# Patient Record
Sex: Male | Born: 1965 | Race: Black or African American | Hispanic: No | Marital: Single | Smoking: Former smoker
Health system: Southern US, Community
[De-identification: ages and names within clinical notes are randomized; demographics above are authoritative.]

## PROBLEM LIST (undated history)

## (undated) DIAGNOSIS — Z91199 Patient's noncompliance with other medical treatment and regimen due to unspecified reason: Secondary | ICD-10-CM

## (undated) DIAGNOSIS — G4730 Sleep apnea, unspecified: Secondary | ICD-10-CM

## (undated) DIAGNOSIS — E669 Obesity, unspecified: Secondary | ICD-10-CM

## (undated) DIAGNOSIS — I509 Heart failure, unspecified: Secondary | ICD-10-CM

## (undated) DIAGNOSIS — I428 Other cardiomyopathies: Secondary | ICD-10-CM

## (undated) DIAGNOSIS — I10 Essential (primary) hypertension: Secondary | ICD-10-CM

## (undated) DIAGNOSIS — N189 Chronic kidney disease, unspecified: Secondary | ICD-10-CM

## (undated) DIAGNOSIS — E876 Hypokalemia: Secondary | ICD-10-CM

## (undated) DIAGNOSIS — I2510 Atherosclerotic heart disease of native coronary artery without angina pectoris: Secondary | ICD-10-CM

## (undated) DIAGNOSIS — Z87898 Personal history of other specified conditions: Secondary | ICD-10-CM

## (undated) DIAGNOSIS — Z720 Tobacco use: Secondary | ICD-10-CM

## (undated) DIAGNOSIS — Z9119 Patient's noncompliance with other medical treatment and regimen: Secondary | ICD-10-CM

## (undated) HISTORY — DX: Chronic kidney disease, unspecified: N18.9

## (undated) HISTORY — DX: Heart failure, unspecified: I50.9

## (undated) HISTORY — DX: Essential (primary) hypertension: I10

## (undated) HISTORY — PX: OTHER SURGICAL HISTORY: SHX169

## (undated) HISTORY — DX: Tobacco use: Z72.0

## (undated) HISTORY — DX: Patient's noncompliance with other medical treatment and regimen due to unspecified reason: Z91.199

## (undated) HISTORY — DX: Personal history of other specified conditions: Z87.898

## (undated) HISTORY — DX: Atherosclerotic heart disease of native coronary artery without angina pectoris: I25.10

## (undated) HISTORY — DX: Obesity, unspecified: E66.9

## (undated) HISTORY — DX: Patient's noncompliance with other medical treatment and regimen: Z91.19

---

## 1998-05-15 ENCOUNTER — Emergency Department (HOSPITAL_COMMUNITY): Admission: EM | Admit: 1998-05-15 | Discharge: 1998-05-15 | Payer: Self-pay | Admitting: Internal Medicine

## 1998-05-23 ENCOUNTER — Emergency Department (HOSPITAL_COMMUNITY): Admission: EM | Admit: 1998-05-23 | Discharge: 1998-05-23 | Payer: Self-pay

## 2004-09-08 ENCOUNTER — Other Ambulatory Visit: Payer: Self-pay

## 2004-09-08 ENCOUNTER — Emergency Department: Payer: Self-pay | Admitting: Emergency Medicine

## 2008-08-22 DIAGNOSIS — Z87898 Personal history of other specified conditions: Secondary | ICD-10-CM

## 2008-08-22 HISTORY — DX: Personal history of other specified conditions: Z87.898

## 2008-11-11 ENCOUNTER — Inpatient Hospital Stay (HOSPITAL_COMMUNITY): Admission: AD | Admit: 2008-11-11 | Discharge: 2008-11-13 | Payer: Self-pay | Admitting: Cardiology

## 2008-11-11 ENCOUNTER — Ambulatory Visit: Payer: Self-pay | Admitting: Cardiology

## 2008-11-13 ENCOUNTER — Encounter: Payer: Self-pay | Admitting: Cardiology

## 2008-11-27 ENCOUNTER — Ambulatory Visit: Payer: Self-pay | Admitting: Cardiology

## 2008-11-27 LAB — CONVERTED CEMR LAB
CO2: 32 meq/L (ref 19–32)
Chloride: 102 meq/L (ref 96–112)
Sodium: 139 meq/L (ref 135–145)

## 2008-12-10 ENCOUNTER — Ambulatory Visit: Payer: Self-pay | Admitting: Cardiology

## 2008-12-10 LAB — CONVERTED CEMR LAB
CO2: 32 meq/L (ref 19–32)
GFR calc Af Amer: 71 mL/min
Glucose, Bld: 86 mg/dL (ref 70–99)
Potassium: 3.6 meq/L (ref 3.5–5.1)
Sodium: 140 meq/L (ref 135–145)

## 2008-12-30 ENCOUNTER — Ambulatory Visit: Payer: Self-pay | Admitting: Cardiology

## 2008-12-30 LAB — CONVERTED CEMR LAB
BUN: 16 mg/dL (ref 6–23)
Chloride: 102 meq/L (ref 96–112)
GFR calc non Af Amer: 64 mL/min
Glucose, Bld: 82 mg/dL (ref 70–99)
Potassium: 3.7 meq/L (ref 3.5–5.1)

## 2009-02-14 ENCOUNTER — Ambulatory Visit: Payer: Self-pay | Admitting: Cardiology

## 2009-02-14 LAB — CONVERTED CEMR LAB
CO2: 29 meq/L (ref 19–32)
Chloride: 99 meq/L (ref 96–112)
Creatinine, Ser: 1.4 mg/dL (ref 0.4–1.5)
Potassium: 3.2 meq/L — ABNORMAL LOW (ref 3.5–5.1)

## 2009-02-18 DIAGNOSIS — I10 Essential (primary) hypertension: Secondary | ICD-10-CM | POA: Insufficient documentation

## 2009-02-18 DIAGNOSIS — F17200 Nicotine dependence, unspecified, uncomplicated: Secondary | ICD-10-CM

## 2009-02-18 DIAGNOSIS — I2510 Atherosclerotic heart disease of native coronary artery without angina pectoris: Secondary | ICD-10-CM

## 2009-02-18 DIAGNOSIS — Z8669 Personal history of other diseases of the nervous system and sense organs: Secondary | ICD-10-CM | POA: Insufficient documentation

## 2009-02-18 DIAGNOSIS — R079 Chest pain, unspecified: Secondary | ICD-10-CM

## 2009-02-18 DIAGNOSIS — E669 Obesity, unspecified: Secondary | ICD-10-CM

## 2009-02-18 HISTORY — DX: Atherosclerotic heart disease of native coronary artery without angina pectoris: I25.10

## 2009-03-06 ENCOUNTER — Telehealth: Payer: Self-pay | Admitting: Cardiology

## 2009-03-28 ENCOUNTER — Ambulatory Visit: Payer: Self-pay | Admitting: Cardiology

## 2009-03-31 LAB — CONVERTED CEMR LAB
BUN: 23 mg/dL (ref 6–23)
CO2: 30 meq/L (ref 19–32)
Chloride: 106 meq/L (ref 96–112)
GFR calc non Af Amer: 65.6 mL/min (ref >60)
Glucose, Bld: 69 mg/dL — ABNORMAL LOW (ref 70–99)
Potassium: 3.8 meq/L (ref 3.5–5.1)

## 2009-05-29 ENCOUNTER — Ambulatory Visit: Payer: Self-pay | Admitting: Cardiology

## 2009-09-19 ENCOUNTER — Encounter: Payer: Self-pay | Admitting: Cardiology

## 2009-09-22 ENCOUNTER — Telehealth: Payer: Self-pay | Admitting: Cardiology

## 2009-10-07 ENCOUNTER — Ambulatory Visit: Payer: Self-pay | Admitting: Cardiology

## 2009-10-07 ENCOUNTER — Ambulatory Visit: Payer: Self-pay

## 2009-10-13 LAB — CONVERTED CEMR LAB
BUN: 20 mg/dL (ref 6–23)
Calcium: 9.3 mg/dL (ref 8.4–10.5)
Creatinine, Ser: 1.5 mg/dL (ref 0.4–1.5)
GFR calc non Af Amer: 65.44 mL/min (ref >60)

## 2009-10-29 ENCOUNTER — Encounter: Payer: Self-pay | Admitting: Pulmonary Disease

## 2009-10-29 ENCOUNTER — Encounter: Payer: Self-pay | Admitting: Cardiology

## 2009-10-29 ENCOUNTER — Ambulatory Visit (HOSPITAL_BASED_OUTPATIENT_CLINIC_OR_DEPARTMENT_OTHER): Admission: RE | Admit: 2009-10-29 | Discharge: 2009-10-29 | Payer: Self-pay | Admitting: Cardiology

## 2009-11-05 ENCOUNTER — Ambulatory Visit: Payer: Self-pay | Admitting: Cardiology

## 2009-11-06 ENCOUNTER — Ambulatory Visit: Payer: Self-pay | Admitting: Pulmonary Disease

## 2009-11-24 ENCOUNTER — Ambulatory Visit: Payer: Self-pay | Admitting: Pulmonary Disease

## 2009-11-24 DIAGNOSIS — G4733 Obstructive sleep apnea (adult) (pediatric): Secondary | ICD-10-CM

## 2009-12-15 ENCOUNTER — Encounter: Payer: Self-pay | Admitting: Cardiology

## 2010-02-18 ENCOUNTER — Ambulatory Visit: Payer: Self-pay | Admitting: Cardiology

## 2010-02-18 ENCOUNTER — Telehealth (INDEPENDENT_AMBULATORY_CARE_PROVIDER_SITE_OTHER): Payer: Self-pay | Admitting: *Deleted

## 2010-03-05 ENCOUNTER — Encounter: Payer: Self-pay | Admitting: Cardiology

## 2010-06-24 ENCOUNTER — Telehealth (INDEPENDENT_AMBULATORY_CARE_PROVIDER_SITE_OTHER): Payer: Self-pay | Admitting: *Deleted

## 2010-10-21 ENCOUNTER — Encounter (INDEPENDENT_AMBULATORY_CARE_PROVIDER_SITE_OTHER): Payer: Self-pay | Admitting: *Deleted

## 2010-11-13 ENCOUNTER — Ambulatory Visit: Payer: Self-pay | Admitting: Cardiology

## 2010-12-13 ENCOUNTER — Encounter: Payer: Self-pay | Admitting: Emergency Medicine

## 2010-12-20 LAB — CONVERTED CEMR LAB
BUN: 21 mg/dL (ref 6–23)
CO2: 29 meq/L (ref 19–32)
Calcium: 9.1 mg/dL (ref 8.4–10.5)
Calcium: 9.4 mg/dL (ref 8.4–10.5)
Creatinine, Ser: 1.5 mg/dL (ref 0.4–1.5)
Creatinine, Ser: 1.5 mg/dL (ref 0.4–1.5)
GFR calc non Af Amer: 65.33 mL/min (ref >60)
Glucose, Bld: 97 mg/dL (ref 70–99)
Sodium: 140 meq/L (ref 135–145)

## 2010-12-22 NOTE — Letter (Signed)
Summary: Appointment - Missed  [facility], Main Office  [address]   [place], [place] [address]   Phone: [phone]  Fax: [phone]     October 21, 2010 MRN: [id number]   [facility] [address] [place], [place]  [address]   Dear Mr. Jarchow,  Our records indicate you missed your appointment in September with Dr. Riley Kill. It is very important that we reach you to reschedule this appointment. We look forward to participating in your health care needs. Please contact us at the number listed above at your earliest convenience to reschedule this appointment.     Sincerely, Neurosurgeon Team LG

## 2010-12-22 NOTE — Assessment & Plan Note (Signed)
Summary: consult for severe osa   Copy to:  Shawnie Pons Primary Provider/Referring Provider:  Ninfa Linden  CC:  Sleep Consult.  History of Present Illness: The pt is a 45y/o male who I have been asked to see for management of osa.  He has had a recent split night study where he was found to have an AHI of 91/hr with desat to 69%.  He was fitted with a medium quattro full face mask, and titrated to an optimal pressure of 10cm.  The pt has been noted to have loud snoring, but no one has mentioned pauses in his breathing during sleep.  He does have occasional choking arousals.  He goes to bed btw 10-74mn, and arises at 7-8am to start his day.  He has frequent awakenings during the night, and is not rested upon arising.  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  He denies any issues with driving.  Of note, his weight is up 45 pounds in the last 2 years, and his epworth score is only 2.  Current Medications (verified): 1)  Aspirin 81 Mg Tbec (Aspirin) .... Take Two  Tablets By Mouth Daily 2)  Pravastatin Sodium 40 Mg Tabs (Pravastatin Sodium) .... Take One Tablet By Mouth Daily At Bedtime 3)  Benazepril Hcl 40 Mg Tabs (Benazepril Hcl) .... Take 1 By Mouth Once Daily 4)  Triamterene-Hctz 37.5-25 Mg Tabs (Triamterene-Hctz) .... Take 1 Tablet By Mouth Once A Day 5)  Amlodipine Besylate 10 Mg Tabs (Amlodipine Besylate) .... Take One Tablet By Mouth Daily 6)  Nitroglycerin 0.4 Mg Subl (Nitroglycerin) .... One Tablet Under Tongue Every 5 Minutes As Needed For Chest Pain---May Repeat Times Three 7)  Carvedilol 25 Mg Tabs (Carvedilol) .... Take 1 1/2 Tabs By Mouth  Two Times A Day 8)  Viagra 50 Mg Tabs (Sildenafil Citrate) .... Take As Directed--Do Not Use With Nitroglycerin 9)  Potassium Chloride Cr 10 Meq Cr-Caps (Potassium Chloride) .... Take One Tablet By Mouth Daily  Allergies (verified): 1)  !  Penicillin  Past History:  Past Medical History: Reviewed history from 02/18/2009 and no changes required. 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 Unremarkable  Family History:  FAMILY HISTORY:  His mother died in her 28s with lupus, but no known   heart disease in his father, died in his 53s with no coronary artery   disease, known.  No siblings have coronary artery disease. 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   He lives with uncle.  He is separated from his wife.   He is  currently working as a cna. pt started smoking at age 87.  1 pack will last 3 days. but denies alcohol or  drug abuse.      Review of Systems       The patient complains of shortness of breath at rest.  The patient denies shortness of breath with activity, productive cough, non-productive cough, coughing up blood, chest pain, irregular heartbeats, acid heartburn, indigestion, loss of appetite, weight change, abdominal pain, difficulty swallowing, sore throat, tooth/dental problems, headaches, nasal congestion/difficulty breathing through nose, sneezing, itching,  ear ache, anxiety, depression, hand/feet swelling, joint stiffness or pain, rash, change in color of mucus, and fever.    Vital Signs:  Patient profile:   45 year old male Height:      67 inches Weight:      210 pounds BMI:     33.01 O2 Sat:      94 % on Room air Temp:     98.0 degrees F oral Pulse rate:   81 / minute BP sitting:   120 / 72  (left arm) Cuff size:   regular  Vitals Entered By: Arman Filter LPN (November 24, 2009 11:03 AM)  O2 Flow:  Room air CC: Sleep  Consult Comments Medications reviewed with patient Arman Filter LPN  November 24, 2009 11:16 AM    Physical Exam  General:  ow male in nad Eyes:  PERRLA and EOMI.   Nose:  deviated septum to left with near obstruction, significant turbinate hypertrophy bilat Mouth:  significant elongation of soft palate and uvula with soft tissue narrowing posteriorly. Neck:  no jvd, tmg, LN Lungs:  clear to auscultation Heart:  rrr, no mrg Abdomen:  soft and nontender, bs+ Extremities:  minimal edema, no cyanosis pulses intact distally Neurologic:  alert and oriented, moves all 4.   Impression & Recommendations:  Problem # 1:  OBSTRUCTIVE SLEEP APNEA (ICD-327.23) The pt has severe osa by his recent sleep study, and underlying CV disease.  Although he feels that he is not very symptomatic during the day, he does admit that his sleep is very fragmented and is nonrestorative.  I have had a long discussion with the pt about osa, including its impact on his QOL and CV health.  I suspect he is more symptomatic than he realizes, and will see a major difference in his sense of well being once treated.  The optimal treatment for this severity of sleep apnea is cpap coupled with weight loss.  The pt is agreeable to trying this.  Medications Added to Medication List This Visit: 1)  Amlodipine Besylate 10 Mg Tabs (Amlodipine besylate) .... Take one tablet by mouth daily 2)  Carvedilol 25 Mg Tabs (Carvedilol) .... Take 1 1/2 tabs by mouth  two times a day  Other Orders: Consultation Level IV (27253) DME Referral (DME)  Patient Instructions: 1)  will set up on cpap, but call if issues. 2)  work on weight loss 3)  followup with me in 4 weeks.  Appended Document: consult for severe osa document reviewed

## 2010-12-22 NOTE — Progress Notes (Signed)
Summary: refill meds  Phone Note Refill Request Call back at Home Phone [phone] Message from:  Patient on June 24, 2010 2:35 PM  Refills Requested: Medication #1:  POTASSIUM CHLORIDE CR 10 MEQ CR-CAPS Take one tablet by mouth daily.  Medication #2:  TRIAMTERENE-HCTZ 37.5-25 MG TABS Take 1 tablet by mouth once a day [facility]    Method Requested: Fax to Local Pharmacy Initial call taken by: Lorne Skeens,  June 24, 2010 2:35 PM  Follow-up for Phone Call        Rx called to pharmacy. Vikki Ports  June 24, 2010 3:31 PM      Prescriptions: POTASSIUM CHLORIDE CR 10 MEQ CR-CAPS (POTASSIUM CHLORIDE) Take one tablet by mouth daily  #30 x 6   Entered by:   Vikki Ports   Authorized by:   Ronaldo Miyamoto, MD, [facility]   Signed by:   Vikki Ports on 06/24/2010   Method used:   Faxed to ...       [facility] (retail)       [address]       [place], [place]  [address]       Ph: [phone]       Fax: [phone]   RxID:   [id number] TRIAMTERENE-HCTZ 37.5-25 MG TABS (TRIAMTERENE-HCTZ) Take 1 tablet by mouth once a day  #30 x 6   Entered by:   Vikki Ports   Authorized by:   Ronaldo Miyamoto, MD, [facility]   Signed by:   Vikki Ports on 06/24/2010   Method used:   Faxed to ...       [facility] (retail)       [address]       [place], [place]  [address]       Ph: [phone]       Fax: [phone]   RxID:   [id number]

## 2010-12-22 NOTE — Assessment & Plan Note (Signed)
Summary: 2 month rov/sl   CC:  No cardiac complains.  Current Medications (verified): 1)  Aspirin 81 Mg Tbec (Aspirin) .... Take Two  Tablets By Mouth Daily 2)  Pravastatin Sodium 40 Mg Tabs (Pravastatin Sodium) .... Take One Tablet By Mouth Daily At Bedtime 3)  Benazepril Hcl 40 Mg Tabs (Benazepril Hcl) .... Take 1 By Mouth Once Daily 4)  Triamterene-Hctz 37.5-25 Mg Tabs (Triamterene-Hctz) .... Take 1 Tablet By Mouth Once A Day 5)  Amlodipine Besylate 10 Mg Tabs (Amlodipine Besylate) .... Take One Tablet By Mouth Daily 6)  Nitroglycerin 0.4 Mg Subl (Nitroglycerin) .... One Tablet Under Tongue Every 5 Minutes As Needed For Chest Pain---May Repeat Times Three 7)  Carvedilol 25 Mg Tabs (Carvedilol) .... Take 1 1/2 Tabs By Mouth  Two Times A Day 8)  Viagra 50 Mg Tabs (Sildenafil Citrate) .... Take As Directed--Do Not Use With Nitroglycerin 9)  Potassium Chloride Cr 10 Meq Cr-Caps (Potassium Chloride) .... Take One Tablet By Mouth Daily  Allergies: 1)  ! Penicillin  Vital Signs:  Patient profile:   45 year old male Height:      67 inches Weight:      207.50 pounds BMI:     32.62 Pulse rate:   68 / minute Pulse rhythm:   regular Resp:     18 per minute BP sitting:   140 / 90  (left arm) Cuff size:   large  Vitals Entered By: Vikki Ports (February 18, 2010 10:40 AM)   EKG  Procedure date:  02/18/2010  Findings:      LVH.  No repole change  Impression & Recommendations:  Problem # 1:  CORONARY ARTERY DISEASE (ICD-414.00)  stable His updated medication list for this problem includes:    Aspirin 81 Mg Tbec (Aspirin) .Marland Kitchen... Take two  tablets by mouth daily    Benazepril Hcl 40 Mg Tabs (Benazepril hcl) .Marland Kitchen... Take 1 by mouth once daily    Amlodipine Besylate 10 Mg Tabs (Amlodipine besylate) .Marland Kitchen... Take one tablet by mouth daily    Nitroglycerin 0.4 Mg Subl (Nitroglycerin) ..... One tablet under tongue every 5 minutes as needed for chest pain---may repeat times three    Carvedilol  25 Mg Tabs (Carvedilol) .Marland Kitchen... Take 1 1/2 tabs by mouth  two times a day  Orders: EKG w/ Interpretation (93000) TLB-BMP (Basic Metabolic Panel-BMET) (80048-METABOL) TLB-Magnesium (Mg) (83735-MG)  Problem # 2:  OBSTRUCTIVE SLEEP APNEA (ICD-327.23) Severe on two separate studies.  Not sure where last study came from.  We did not order this study.  Nonetheless, he cannot afford.  I spoke with him at lenght about need for weight loss, consequences of severe sleep apnea, and find findings.  reveiwed in detail.  Problem # 3:  HYPERTENSION, BENIGN (ICD-401.1)  Likely from weight, sleep apnea, etc.  Has LVH.  Reviewed consequences with patient.   Check BMET. His updated medication list for this problem includes:    Aspirin 81 Mg Tbec (Aspirin) .Marland Kitchen... Take two  tablets by mouth daily    Benazepril Hcl 40 Mg Tabs (Benazepril hcl) .Marland Kitchen... Take 1 by mouth once daily    Triamterene-hctz 37.5-25 Mg Tabs (Triamterene-hctz) .Marland Kitchen... Take 1 tablet by mouth once a day    Amlodipine Besylate 10 Mg Tabs (Amlodipine besylate) .Marland Kitchen... Take one tablet by mouth daily    Carvedilol 25 Mg Tabs (Carvedilol) .Marland Kitchen... Take 1 1/2 tabs by mouth  two times a day  Orders: EKG w/ Interpretation (93000) TLB-BMP (Basic Metabolic Panel-BMET) (80048-METABOL)  TLB-Magnesium (Mg) (83735-MG)  Problem # 4:  TOBACCO ABUSE (ICD-305.1) Reviewed techniques for discontinuation with him.    Patient Instructions: 1)  Your physician recommends that you have lab work today: BMP and Magnesium 2)  Your physician recommends that you continue on your current medications as directed. Please refer to the Current Medication list given to you today. 3)  Your physician wants you to follow-up in:   4 MONTHS. You will receive a reminder letter in the mail two months in advance. If you don't receive a letter, please call our office to schedule the follow-up appointment. 4)  Your physician recommends that you schedule a follow-up appointment: with Dr Shelle Iron

## 2010-12-22 NOTE — Progress Notes (Signed)
Summary: speak to nurs- pt being seen NOW  Phone Note From Other Clinic   Caller: lauren w/ dr Riley Kill Call For: clance Summary of Call: requst to speak to nurse re: pt. pt there now. call lauren @ x [phone] Initial call taken by: Tivis Ringer, CNA,  February 18, 2010 11:21 AM  Follow-up for Phone Call        called and spoke with Lauren Dr. Rosalyn Charters nurse.  Lauren wanted to know if Dr. Shelle Iron had ordered this home sleep study that was scanned into the pt's chart on 12/15/2009.  I informed Lauren that Dr. Shelle Iron has only seen the pt one time for a sleep consult on 11-24-2009 and the only thing he ordered for pt was to start him on cpap.  Also, I asked Lauren if she would remind the pt to make a f/u appt with [facility] because he is overdue for his 4 week f/u appt.  Lauren stated she will inform pt.  Aundra Millet Reynolds LPN  February 18, 2010 11:28 AM

## 2011-02-01 ENCOUNTER — Ambulatory Visit: Payer: Self-pay | Admitting: Cardiology

## 2011-04-06 NOTE — Cardiovascular Report (Signed)
NAMEFARRON, [name]                ACCOUNT NO.:  [id number]   MEDICAL RECORD NO.:  [id number]          PATIENT TYPE:  INP   LOCATION:  2901                         FACILITY:  [facility]   PHYSICIAN:  Arturo Morton. Riley Kill, MD, FACCDATE OF BIRTH:  04-16-1966   DATE OF PROCEDURE:  11/11/2008  DATE OF DISCHARGE:                            CARDIAC CATHETERIZATION   INDICATIONS:  [name] is a 45 year old gentleman from [place] who  presented to the [facility] Emergency Room with a history of chest pain.  EKG showed about a millimeter of ST elevation in V1-V3.  This was  accompanied by some mild inferior ST depression.  Given the patient's  history, Dr. Colon Branch initiated a code STEMI for urgent cardiac  catheterization.  He was brought to the [facility] Lab for urgent  intervention.   PROCEDURES:  1. Left heart catheterization.  2. Selective coronary arteriography.  3. Selective left ventriculography.   DESCRIPTION OF THE PROCEDURE:  The patient was brought to the  catheterization laboratory and prepped and draped in the usual fashion.  Through an anterior puncture, the femoral artery was entered after we  examined the patient.  A 6-French sheath was then placed.  ACT was  checked.  Views of the left coronary artery and right coronary artery  were made.  The left coronary artery had an upward takeoff, we had re-  engaged with a 35 catheter.  There was also some catheter-induced spasm  into the ostium of the right coronary.  As a result, following  ventriculography, we went back with a 3-curved guiding catheter.  Intra-  aortic nitroglycerin was given to maximally dilate the vessels.  Additional more high-volume views of the left coronary system were  obtained by necessity.  This was followed by use of a 3DRC catheter to  engage the right coronary artery.  Additional nitroglycerin was given  here as well.  Following this, views of the right coronary artery were  obtained.  We carefully  reviewed all of the films, and based on the  findings, we elected to recommend medical treatment with additional  enzymes.   HEMODYNAMIC DATA:  1. The initial central aortic pressure was 145/95, mean 117.  2. Left ventricular pressure was 143/30.  3. There was no gradient on pullback across the aortic valve.   ANGIOGRAPHIC DATA:  1. The left main tapers distally with about 20% narrowing.  2. The proximal LAD clearly has some mild concentric plaquing.  This      would measure about 40-50% in luminal reduction.  The vessel opens      up and then is calcified in its midportion.  Beyond the area of      calcification is a second area of narrowing.  The second area of      narrowing measures about 40-50% in luminal reduction.  This was      shown to the patient in multiple views.  The distal LAD wraps the      apex and was without critical narrowing.  3. The circumflex provides a proximal marginal and a second marginal  branch.  There is a third marginal branch that is moderate in size.      The AV circumflex after this has probably 30 and 40% narrowing in a      segmental area of plaquing distally.  4. The right coronary artery is somewhat smaller caliber vessel.  Just      after the takeoff of the SA nodal artery, there is about 50% area      of focal narrowing.  There is 40% narrowing in the midvessel and 20-      30% narrowing more distal to this.  5. Ventriculography in the RAO projection reveals vigorous global      systolic function.  No definite wall motion abnormalities are      identified.   CONCLUSIONS:  1. Preserved LV function.  2. Scattered coronary artery disease.   RECOMMENDATIONS:  1. We will do serial enzymes.  2. We will highly recommend that the patient attempt to stop smoking      with well-recognized programs, including setting a date and 2-week      followup.  3. We may add some nitrates to his overall regimen.  At the present      time, he will be on a  medical regimen.  4. We will add aspirin.  5. He will require an exercise tolerance test when stabilized.      Arturo Morton. Riley Kill, MD, [facility]  Electronically Signed     TDS/MEDQ  D:  11/11/2008  T:  11/12/2008  Job:  [id number]   cc:   Nicoletta Dress. Colon Branch, M.D.  Cardiovascular Laboratory

## 2011-04-06 NOTE — H&P (Signed)
[name], [name]                ACCOUNT NO.:  [id number]   MEDICAL RECORD NO.:  [id number]          PATIENT TYPE:  INP   LOCATION:  2901                         FACILITY:  [facility]   PHYSICIAN:  Arturo Morton. Riley Kill, MD, FACCDATE OF BIRTH:  July 02, 1966   DATE OF ADMISSION:  11/11/2008  DATE OF DISCHARGE:                              HISTORY & PHYSICAL   PRIMARY CARE PHYSICIAN:  [facility].   PRIMARY CARDIOLOGIST:  None.   CHIEF COMPLAINT:  Chest pain.   HISTORY OF PRESENT ILLNESS:  Mr. Montilla is a 45 year old male with no  previous history of coronary artery disease.  He has been having  episodes of chest pain at times, but they have all resolves  spontaneously.  Today, he was awakened by chest pain at approximately 2  a.m.  It increased to 8/10.  He went to [facility] and  was sent from there by car to [facility] Emergency room.  In [facility]  Emergency Room, his EKG was concerning for an ST elevation MI.  He was  transferred by EMS to [facility] and taken directly to the Cath Lab.  He was  restarted on IV nitroglycerin.  He was given morphine IV.  Also per EMS,  he was given 325 mg of aspirin.  Currently, his chest pain is a 2/10.  It ha not resolved at any time since it woke him this morning.  It has  been associated with shortness of breath, but no nausea, vomiting, or  diaphoresis.   PAST MEDICAL HISTORY:  1. Hypertension.  2. Ongoing tobacco use.  3. Borderline obesity with a body mass index of 30.7.  4. History of syncope in October 2009 felt secondary to hypertension.  5. Status post echocardiogram in December 2008 by the [facility] [place], showing grade 1 diastolic dysfunction with mild      LDH and an EF greater than 55%, no significant valvular disease.   ALLERGIES:  PENICILLIN.   CURRENT MEDICATIONS:  1. Maxzide 25/37.5 daily.  2. Lotrel 5/20 mg 2 capsules daily.  3. Aldomet 250 mg b.i.d.   SOCIAL HISTORY:  He lives in  [place], but has a Psychiatrist.  He lives with family.  He is separated from his wife.  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  He has an  approximately 20-pack-year history of tobacco use, but denies alcohol or  drug abuse.   FAMILY HISTORY:  His mother died in her 54s with lupus, but no known  heart disease in his father, died in his 52s with no coronary artery  disease, known.  No siblings have coronary artery disease.   REVIEW OF SYSTEMS:  He has not been having fevers, chills, or sweats.  This is his first episode of chest pain in the last few days.  He has  not had any other episodes of syncope.  Review of systems was  abbreviated, but is otherwise negative.   PHYSICAL EXAMINATION:  VITAL SIGNS: Blood pressure 144/79, heart rate  70,  respiratory rate 18, temperature 97.3, and O2 saturation 100% on O2.  GENERAL:  He is a well-developed [place] American male in mild distress.  HEENT:  Normal.  NECK:  There is no lymphadenopathy, thyromegaly, bruit, or JVD noted.  CV:  Heart is regular in rate and rhythm with an S1, S2, and no  significant murmur, rub, or gallop is noted.  LUNGS:  Essentially clear to auscultation bilaterally.  SKIN:  No rashes or lesions are noted.  ABDOMEN:  Soft and nontender with active bowel sounds.  EXTREMITIES:  There is no cyanosis, clubbing, or edema noted.  Distal  pulses are intact in all four extremities and no femoral bruits are  appreciated.  MUSCULOSKELETAL:  There is no joint deformity or effusions.  NEUROLOGIC:  He is alert and oriented.  Cranial nerves II through XII  grossly intact.   EKG did at 3:02 p.m. shows heart rate 71 beats per minute, sinus rhythm  with J-point elevation in the V1 and V2, and T-wave inversions in  inferior leads.   LABORATORY VALUES:  Hemoglobin 15.5, hematocrit 45.8, WBC 6.3, platelets  336.  Sodium 132, potassium 4.0, chloride 97, CO2 32, BUN 18, creatinine  1.29, glucose 85.   Point care markers negative x1.   IMPRESSION:  Mr. Salemi was seen today by Dr. Riley Kill.  His EKG is  concerning for an ST elevation myocardial infarction and he is having  ongoing pain.  The duration of the pain is greater than 12 hours.  He is  being taken emergently to the Cath Lab with further evaluation and  treatment depending on the results of the procedure.      Theodore Demark, PA-C      Arturo Morton. Riley Kill, MD, [facility]  Electronically Signed    RB/MEDQ  D:  11/11/2008  T:  11/12/2008  Job:  [id number]   cc:   Olegario Messier L. Jarold Motto, FNP-C

## 2011-04-06 NOTE — Assessment & Plan Note (Signed)
[facility]                            CARDIOLOGY OFFICE NOTE   [name], [name]                       MRN:          [id number]  DATE:12/10/2008                            DOB:          12-Aug-1966    PRIMARY CARDIOLOGIST:  Arturo Morton. Riley Kill, MD, [facility].   PRIMARY CARE Anette Barra:  Ninfa Linden, NP   PATIENT PROFILE:  [name] with prior history  of hypertension and nonobstructive CAD who presents for clinic followup.  1. History of chest pain/nonobstructive coronary artery disease.      a.     December 2009, left heart cardiac catheterization, left main       20%.  LAD 40-50% proximal, 6% mid.  Left circumflex with mild       scattered plaquing.  RCA with diffuse nonobstructive disease.  EF       was preserved.      b.     A 2-D echocardiogram performed on November 13, 2008, showing       an EF of 60% with a questionable inferoseptal hypokinesis at the       base.  2. Hypertension.  3. Ongoing tobacco abuse, currently smoking 1-2 cigarettes a day.  4. Borderline obesity.  5. History of syncope in October 2009, felt secondary to hypertension.   HISTORY OF PRESENT ILLNESS:  [name] with  the above problems.  He was last seen in clinic by Dr. Riley Kill on  November 27, 2008.  During that visit, his blood pressure was elevated and  adjustments were made to his medications.  He did have a BMET that day,  which showed normal renal function as well as a normal electrolytes.  He  was advised to have followup BMET 1 week later.  He has not had a repeat  BMET.  He did as Dr. Riley Kill advised and took a half of a triamterene  HCTZ and then subsequently titrated to a full 37.5/25 mg tablets.  He  noticed once doing this, but his blood pressure was much improved in the  140s-150s compared to the usual 190s-200s.  He has been checking his  pressure at home regularly.  He has been walking twice a day for 30  minutes without chest pain or dyspnea.  He thinks that his exercise  tolerance has much improved since blood pressure is improved.  His blood  pressure today is 125/79.   MEDICATIONS:  1. Aspirin 81 mg 2 tablets daily.  2. Carvedilol 12.5 mg b.i.d.  3. Pravastatin 40 mg daily.  4. Benazepril 40 mg daily.  5. Triamterene HCTZ 37.5/25 mg daily.  6. Amlodipine 10 mg daily.   PHYSICAL EXAMINATION:  VITAL SIGNS:  Blood pressure 125/79, pulse 78,  respirations 60.  He is afebrile.  Weight is 202 pounds.  GENERAL:  Pleasant African American male in no acute distress.  Awake,  alert x3.  PSYCH:  Normal affect.  HEENT:  Normal.  NEURO:  Grossly intact.  Nonfocal.  SKIN:  Warm and dry without lesions or masses.  MUSCULOSKELETAL:  Grossly  normal without deformity or fusions.  NECK:  No bruits, JVD.  LUNGS: Respirations are unlabored.  Clear to auscultation.  CARDIAC:  Regular S1 and S2.  No S3, S4, or murmurs.  ABDOMEN:  Round, nontender, and nondistended.  Bowel sounds present.  LOWER EXTREMITIES:  Warm and dry.  No clubbing, cyanosis, or edema.  Dorsalis pedis and posterior tibial pulses 2+ bilaterally.   ACCESSORY CLINICAL FINDINGS:  Repeat BMET is pending.   ASSESSMENT AND PLAN:  1. History of chest pain with nonobstructive coronary artery disease.      The patient has not had any recurrent chest pain.  He has been      walking 30 minutes 2 times a day without significant symptoms or      limitations.  He remains on aspirin, beta-blocker, ACE inhibitor,      statin, and calcium channel blocker therapy.  2. Hypertension.  Blood pressure is markedly improved today.  Since      pressures still running in the 140s-150s at home.  However, it was      previously running in the 190s-200s.  We will make no changes to      his medications today and consolidate his Coreg and benazepril,      which currently he is taking 2 tablets of lower doses to equal the      daily dose.  I have therefore  given a prescription for carvedilol      12.5 mg b.i.d. and benazepril 40 mg daily.  I will continue on      triamterene HCTZ, and because of this, we will obtain a repeat BMET      to determine whether or not he will require potassium      supplementation as well.  He has required potassium in the past.  3. Ongoing tobacco abuse.  The patient has cut back to 1-2 cigarettes      a day.  Smoking cessation has been strongly advised.  He is      currently considering using a patch.  I recommended that if he does      use the patch, he should use the 7 mg dose and try and taper that      to nothing over a 6-week period.  4. Hyperlipidemia.  The patient is on pravastatin followed by primary      care.   DISPOSITION:  The patient is advised to follow up his primary care  [name] in the next 2-4 weeks.  Follow up with Dr. Riley Kill in 6 months  or sooner if necessary.      Nicolasa Ducking, ANP  Electronically Signed      Rollene Rotunda, MD, [facility]  Electronically Signed   CB/MedQ  DD: 12/10/2008  DT: 12/11/2008  Job #: [id number]

## 2011-04-06 NOTE — Letter (Signed)
November 27, 2008    Ninfa Linden, [facility]  [facility] [address]  [place], [place] [address]   RE:  SELSO, MANNOR  MRN:  [id number]  /  DOB:  [birth year]   Dear Ms. Jarold Motto:   We saw Mr. Kern back in followup today.  He was transferred urgently to  [facility].  While here, he had mild ST elevation and he was  thought to be having an infarct, he did have evidence of coronary  disease, but it was not critical.  He had no definite etiology defined.  Since discharge from the hospital, his blood pressures remained  elevated.  Importantly, the patient did have some adjustments in his  medicines, with Aldomet being discontinued also because of concern of  combination of ACE inhibitors, and potassium-sparing agents, his  triamterene, HCTZ was stopped.  He did tell me that you have started him  on some potassium after starting him on some hydrochlorothiazide  previously.  His blood pressures had been somewhat elevated with  diastolics running in the low 100s.  He denies any recurrent chest pain.   MEDICATIONS:  1. Aspirin 162 mg daily.  2. Carvedilol 6.25 mg 2 tablets p.o. b.i.d.  3. Pravastatin 40 mg daily.  4. Benazepril 20 mg 2 tablets daily.  5. Amlodipine 10 mg one-half tablet daily.   PHYSICAL EXAMINATION:  The blood pressure is 160/100, the pulse is 62.  The lung fields are clear and the cardiac rhythm is regular.  There is  an S4 gallop.   The electrocardiogram reveals voltage criteria for left ventricular  hypertrophy and repolarization abnormality.   To summarize, he remains hypertensive.  We are going to start him back  on half of the triamterene, HCTZ after taking 1 tablet for 2 days, and I  will have him increase his amlodipine to a full tablet a day.  I have  asked him to see you in followup in 1 week to get a basic metabolic  profile, we will see him back in our Cardiology Clinic in 2 weeks.  We  appreciate the opportunity  of sharing in his care.    Sincerely,      Arturo Morton. Riley Kill, MD, [facility]  Electronically Signed    TDS/MedQ  DD: 11/27/2008  DT: 11/27/2008  Job #: [id number]

## 2011-04-06 NOTE — Discharge Summary (Signed)
NAMEKELIJAH, [name]                ACCOUNT NO.:  [id number]   MEDICAL RECORD NO.:  [id number]          PATIENT TYPE:  INP   LOCATION:  2004                         FACILITY:  [facility]   PHYSICIAN:  Arturo Morton. Riley Kill, MD, FACCDATE OF BIRTH:  05-05-1966   DATE OF ADMISSION:  11/11/2008  DATE OF DISCHARGE:  11/13/2008                               DISCHARGE SUMMARY   CHIEF COMPLAINT:  Chest pain.   HISTORY:  [name] was seen by Dr. Nicoletta Dress. Colon Branch, MD at the emergency  room after complaining of chest pain.  He had a borderline EKG.  As a  result, he was transferred urgently.  He had very mild ST elevation in  V1 and V2 with some inferior ST depression, and as a result was thought  to be having an infarct.  He was brought urgently to the cardiac  catheterization laboratory where he underwent procedure by me.  This  demonstrated scattered disease.  Specifically, there was 50, 40, 20, and  30% lesions in the right coronary.  The left coronary had about a 20%  left main and a 40-50% proximal LAD, and a 60% mid LAD.  There was mild  scattered plaquing in the AV circumflex and his overall LV function was  preserved.  The patient subsequently stabilized.  The following morning,  he was stable without any evidence of recurrent discomfort.  His cardiac  enzymes were negative x3.  Repeat electrocardiogram basically  demonstrated left ventricular hypertrophy with less T-wave abnormality  on the followup EKG.  His D-dimer was negative as well x2.  There was no  definite etiology identified.  At the time of discharge, he was stable  and ambulatory.  His lungs were clear to auscultation and percussion.  He was not febrile and there was no definite cough.  He saw the tobacco  cessation nurse in the hospital.  A 2-D echo was obtained and the  official report was pending at the time of discharge.  His white count  was normal.  He was also sent for chest x-ray, which was pending.  We  counseled him on  discontinuation of smoking, and the need to limit salt  in his diet.   Follow up will be with Dr. Shawnie Pons on Wednesday, November 28, 2007,  at 9 a.m.  He was encouraged not to drive and to take it easy for a  while until he is seen back in followup.  We also reviewed his medicines  in detail.   DISCHARGE MEDICATIONS:  1. Lotrel 05/20 one tablet daily.  2. Aspirin 162 mg daily.  3. Carvedilol 6.25 mg 1 twice daily.  4. Pravastatin 40 mg one daily.  5. Nitroglycerin 0.4 mg only as needed p.r.n., he was instructed in      its use.   FINAL DIAGNOSES:  1. Chest pain of uncertain etiology.  2. Systemic hypertension.  3. Moderate coronary artery disease by diagnostic catheterization      without acute findings.  4. Tobacco abuse.  5. History of prior echocardiogram demonstrating grade 1 diastolic  dysfunction.      Arturo Morton. Riley Kill, MD, [facility]  Electronically Signed     TDS/MEDQ  D:  11/13/2008  T:  11/13/2008  Job:  [id number]   cc:   Ninfa Linden, MSM, FNP-C  [facility]

## 2011-04-09 NOTE — Letter (Signed)
March 06, 2009    [facility]  [address]  [place], [place] [address]   RE:  WILLAIM, MODE  MRN:  [id number]  /  DOB:  Nov 11, 1966   To Whom It May Concern:   Mr. Battie is a patient who has been under my care.  He was hospitalized  in December, he continues to remain under my care.  Please call me if  you have any further questions.     Sincerely,      Arturo Morton. Riley Kill, MD, [facility]  Electronically Signed    TDS/MedQ  DD: 03/06/2009  DT: 03/07/2009  Job #: [id number]

## 2011-05-12 ENCOUNTER — Ambulatory Visit (INDEPENDENT_AMBULATORY_CARE_PROVIDER_SITE_OTHER): Payer: Self-pay | Admitting: Cardiology

## 2011-05-12 ENCOUNTER — Encounter: Payer: Self-pay | Admitting: Cardiology

## 2011-05-12 DIAGNOSIS — G4733 Obstructive sleep apnea (adult) (pediatric): Secondary | ICD-10-CM

## 2011-05-12 DIAGNOSIS — F17200 Nicotine dependence, unspecified, uncomplicated: Secondary | ICD-10-CM

## 2011-05-12 DIAGNOSIS — E7800 Pure hypercholesterolemia, unspecified: Secondary | ICD-10-CM

## 2011-05-12 DIAGNOSIS — I10 Essential (primary) hypertension: Secondary | ICD-10-CM

## 2011-05-12 DIAGNOSIS — Z136 Encounter for screening for cardiovascular disorders: Secondary | ICD-10-CM

## 2011-05-12 LAB — BASIC METABOLIC PANEL
CO2: 25 mEq/L (ref 19–32)
Calcium: 9.9 mg/dL (ref 8.4–10.5)
Chloride: 99 mEq/L (ref 96–112)
Glucose, Bld: 99 mg/dL (ref 70–99)
Potassium: 4.1 mEq/L (ref 3.5–5.1)
Sodium: 138 mEq/L (ref 135–145)

## 2011-05-12 MED ORDER — BENAZEPRIL HCL 40 MG PO TABS: 40.0000 mg | ORAL_TABLET | Freq: Every day | ORAL | Status: DC

## 2011-05-12 MED ORDER — CARVEDILOL 25 MG PO TABS: ORAL_TABLET | ORAL | Status: DC

## 2011-05-12 NOTE — Patient Instructions (Signed)
Your physician recommends that you schedule a follow-up appointment in: 3 weeks with Lilian Coma And 6 months with Dr. Riley Kill.

## 2011-05-20 ENCOUNTER — Other Ambulatory Visit: Payer: Self-pay | Admitting: *Deleted

## 2011-05-20 DIAGNOSIS — Z79899 Other long term (current) drug therapy: Secondary | ICD-10-CM

## 2011-05-26 ENCOUNTER — Encounter: Payer: Self-pay | Admitting: Cardiology

## 2011-05-26 NOTE — Assessment & Plan Note (Signed)
BP is currently not well controlled.  He did not take his carvedilol earlier today.  BP is poorly controlled.  Will need to get him back promptly for HTN follow up.  Need for compliance with therapies stressed.  Will have him follow up with Tereso Newcomer in three weeks to assess how he is doing back on meds.  Needs to get home cuff also to monitor therapy.  This is not truly a cardiac issue at this point in time, but overall a general medicine issue that needs close follow up.

## 2011-05-26 NOTE — Progress Notes (Signed)
HPI:  He is in for followup.  I guess he could not get meds.  Stopped benazepril and carvedilol, now back on.  Still smokes.  Has sleep apnea, and has not treated.  No chest pain.  Long discussion today about all of these issues in detail.   Current Outpatient Prescriptions  Medication Sig Dispense Refill  . amLODipine (NORVASC) 10 MG tablet Take 10 mg by mouth daily.        Marland Kitchen aspirin 81 MG tablet Take 81 mg by mouth daily.        . benazepril (LOTENSIN) 40 MG tablet Take 1 tablet (40 mg total) by mouth daily.  30 tablet  11  . carvedilol (COREG) 25 MG tablet Take one tablet by mouth twice daily.  60 tablet  11  . nitroGLYCERIN (NITROSTAT) 0.4 MG SL tablet Place 0.4 mg under the tongue every 5 (five) minutes as needed.        . potassium chloride (KLOR-CON) 10 MEQ CR tablet Take 10 mEq by mouth daily.        . pravastatin (PRAVACHOL) 40 MG tablet Take 40 mg by mouth daily.        . sildenafil (VIAGRA) 50 MG tablet Take 50 mg by mouth daily as needed.        . triamterene-hydrochlorothiazide (DYAZIDE) 37.5-25 MG per capsule Take 1 capsule by mouth every morning.          Allergies  Allergen Reactions  . Penicillins     No past medical history on file.  No past surgical history on file.  No family history on file.  History   Social History  . Marital Status: Married    Spouse Name: N/A    Number of Children: N/A  . Years of Education: N/A   Occupational History  . Not on file.   Social History Main Topics  . Smoking status: Current Everyday Smoker  . Smokeless tobacco: Not on file  . Alcohol Use: Not on file  . Drug Use: Not on file  . Sexually Active: Not on file   Other Topics Concern  . Not on file   Social History Narrative  . No narrative on file    ROS: Please see the HPI.  All other systems reviewed and negative.  PHYSICAL EXAM:  BP 178/114  Pulse 98  Ht 5\' 7"  (1.702 m)  Wt 219 lb (99.338 kg)  BMI 34.30 kg/m2  General: Well developed, well nourished,  in no acute distress. Head:  Normocephalic and atraumatic. Neck: no JVD Lungs: Clear to auscultation and percussion. Heart: Normal S1 and S2.Prominent S4 gallop.  No murmur.  Abdomen:  Normal bowel sounds; soft; non tender; no organomegaly Pulses: Pulses normal in all 4 extremities. Extremities: No clubbing or cyanosis. No edema. Neurologic: Alert and oriented x 3.  EKG:  NSR.  LVH with repole changes.   ASSESSMENT AND PLAN:

## 2011-05-26 NOTE — Assessment & Plan Note (Signed)
Issues of tobacco were discussed in detail.

## 2011-05-26 NOTE — Assessment & Plan Note (Signed)
Suggested he consider returning to Dr. Shelle Iron for follow up of this problem.

## 2011-06-01 ENCOUNTER — Encounter: Payer: Self-pay | Admitting: Physician Assistant

## 2011-06-02 ENCOUNTER — Ambulatory Visit: Payer: Self-pay | Admitting: Physician Assistant

## 2011-06-24 ENCOUNTER — Other Ambulatory Visit: Payer: Self-pay | Admitting: *Deleted

## 2011-06-24 DIAGNOSIS — I10 Essential (primary) hypertension: Secondary | ICD-10-CM

## 2011-06-24 MED ORDER — BENAZEPRIL HCL 40 MG PO TABS: 40.0000 mg | ORAL_TABLET | Freq: Every day | ORAL | Status: DC

## 2011-06-24 MED ORDER — CARVEDILOL 25 MG PO TABS: ORAL_TABLET | ORAL | Status: DC

## 2011-07-12 ENCOUNTER — Other Ambulatory Visit (INDEPENDENT_AMBULATORY_CARE_PROVIDER_SITE_OTHER): Payer: Self-pay | Admitting: *Deleted

## 2011-07-12 DIAGNOSIS — Z79899 Other long term (current) drug therapy: Secondary | ICD-10-CM

## 2011-07-12 LAB — BASIC METABOLIC PANEL
BUN: 21 mg/dL (ref 6–23)
Calcium: 9.3 mg/dL (ref 8.4–10.5)
Creatinine, Ser: 1.4 mg/dL (ref 0.4–1.5)
GFR: 68.05 mL/min (ref >60.00)
Glucose, Bld: 107 mg/dL — ABNORMAL HIGH (ref 70–99)

## 2011-07-22 ENCOUNTER — Telehealth: Payer: Self-pay | Admitting: Cardiology

## 2011-07-22 NOTE — Telephone Encounter (Signed)
Patient aware of test results.

## 2011-07-22 NOTE — Telephone Encounter (Signed)
Test result

## 2011-08-03 ENCOUNTER — Other Ambulatory Visit: Payer: Self-pay | Admitting: *Deleted

## 2011-08-03 DIAGNOSIS — I10 Essential (primary) hypertension: Secondary | ICD-10-CM

## 2011-08-03 MED ORDER — CARVEDILOL 25 MG PO TABS: ORAL_TABLET | ORAL | Status: DC

## 2011-08-03 MED ORDER — BENAZEPRIL HCL 40 MG PO TABS: 40.0000 mg | ORAL_TABLET | Freq: Every day | ORAL | Status: DC

## 2011-08-04 ENCOUNTER — Emergency Department (HOSPITAL_COMMUNITY)
Admission: EM | Admit: 2011-08-04 | Discharge: 2011-08-04 | Disposition: A | Discharge status: Home / Self Care | Payer: Self-pay | Attending: Emergency Medicine | Admitting: Emergency Medicine

## 2011-08-04 ENCOUNTER — Emergency Department (HOSPITAL_COMMUNITY): Payer: Self-pay

## 2011-08-04 DIAGNOSIS — F17200 Nicotine dependence, unspecified, uncomplicated: Secondary | ICD-10-CM | POA: Insufficient documentation

## 2011-08-04 DIAGNOSIS — J40 Bronchitis, not specified as acute or chronic: Secondary | ICD-10-CM | POA: Insufficient documentation

## 2011-08-04 DIAGNOSIS — R059 Cough, unspecified: Secondary | ICD-10-CM | POA: Insufficient documentation

## 2011-08-04 DIAGNOSIS — R112 Nausea with vomiting, unspecified: Secondary | ICD-10-CM | POA: Insufficient documentation

## 2011-08-04 DIAGNOSIS — R05 Cough: Secondary | ICD-10-CM | POA: Insufficient documentation

## 2011-08-04 DIAGNOSIS — L702 Acne varioliformis: Secondary | ICD-10-CM | POA: Insufficient documentation

## 2011-08-04 DIAGNOSIS — Z79899 Other long term (current) drug therapy: Secondary | ICD-10-CM | POA: Insufficient documentation

## 2011-08-04 DIAGNOSIS — I10 Essential (primary) hypertension: Secondary | ICD-10-CM | POA: Insufficient documentation

## 2011-08-05 ENCOUNTER — Other Ambulatory Visit: Payer: Self-pay | Admitting: Cardiology

## 2011-08-27 LAB — CBC
HCT: 41 % (ref 39.0–52.0)
HCT: 45.8 % (ref 39.0–52.0)
Hemoglobin: 15.5 g/dL (ref 13.0–17.0)
MCHC: 33.7 g/dL (ref 30.0–36.0)
MCV: 91 fL (ref 78.0–100.0)
MCV: 91.3 fL (ref 78.0–100.0)
Platelets: 336 10*3/uL (ref 150–400)
RBC: 4.49 MIL/uL (ref 4.22–5.81)
WBC: 5.9 10*3/uL (ref 4.0–10.5)
WBC: 6.3 10*3/uL (ref 4.0–10.5)

## 2011-08-27 LAB — BASIC METABOLIC PANEL
BUN: 18 mg/dL (ref 6–23)
Chloride: 97 mEq/L (ref 96–112)
Potassium: 4 mEq/L (ref 3.5–5.1)
Sodium: 132 mEq/L — ABNORMAL LOW (ref 135–145)

## 2011-08-27 LAB — COMPREHENSIVE METABOLIC PANEL
BUN: 19 mg/dL (ref 6–23)
CO2: 29 mEq/L (ref 19–32)
Calcium: 9 mg/dL (ref 8.4–10.5)
Chloride: 103 mEq/L (ref 96–112)
Creatinine, Ser: 1.24 mg/dL (ref 0.4–1.5)
GFR calc Af Amer: >60 mL/min (ref >60)
GFR calc non Af Amer: >60 mL/min (ref >60)
Glucose, Bld: 89 mg/dL (ref 70–99)
Total Bilirubin: 0.6 mg/dL (ref 0.3–1.2)

## 2011-08-27 LAB — APTT: aPTT: 30 seconds (ref 24–37)

## 2011-08-27 LAB — D-DIMER, QUANTITATIVE: D-Dimer, Quant: 0.27 ug/mL-FEU (ref 0.00–0.48)

## 2011-08-27 LAB — CARDIAC PANEL(CRET KIN+CKTOT+MB+TROPI)
CK, MB: 1.3 ng/mL (ref 0.3–4.0)
CK, MB: 1.4 ng/mL (ref 0.3–4.0)
Relative Index: 1 (ref 0.0–2.5)
Total CK: 129 U/L (ref 7–232)
Total CK: 148 U/L (ref 7–232)
Total CK: 157 U/L (ref 7–232)
Troponin I: 0.01 ng/mL (ref 0.00–0.06)
Troponin I: 0.01 ng/mL (ref 0.00–0.06)

## 2011-08-27 LAB — POCT CARDIAC MARKERS
CKMB, poc: 1.7 ng/mL (ref 1.0–8.0)
Myoglobin, poc: 60.7 ng/mL (ref 12–200)

## 2011-08-27 LAB — PROTIME-INR: INR: 0.9 (ref 0.00–1.49)

## 2011-11-03 ENCOUNTER — Emergency Department (HOSPITAL_COMMUNITY)
Admission: EM | Admit: 2011-11-03 | Discharge: 2011-11-03 | Discharge status: Against Medical Advice | Payer: Self-pay | Attending: Emergency Medicine | Admitting: Emergency Medicine

## 2011-11-03 ENCOUNTER — Encounter (HOSPITAL_COMMUNITY): Payer: Self-pay | Admitting: *Deleted

## 2011-11-03 DIAGNOSIS — K625 Hemorrhage of anus and rectum: Secondary | ICD-10-CM | POA: Insufficient documentation

## 2011-11-03 LAB — POCT I-STAT, CHEM 8
BUN: 18 mg/dL (ref 6–23)
Calcium, Ion: 1.14 mmol/L (ref 1.12–1.32)
Hemoglobin: 15 g/dL (ref 13.0–17.0)
Sodium: 141 mEq/L (ref 135–145)
TCO2: 27 mmol/L (ref 0–100)

## 2011-11-03 LAB — CBC
MCH: 30.9 pg (ref 26.0–34.0)
MCHC: 35.8 g/dL (ref 30.0–36.0)
MCV: 86.4 fL (ref 78.0–100.0)
Platelets: 341 10*3/uL (ref 150–400)
RBC: 4.63 MIL/uL (ref 4.22–5.81)

## 2011-11-03 NOTE — ED Notes (Addendum)
No answer x3

## 2011-11-03 NOTE — ED Notes (Signed)
Pt reports 2 day hx of blood streaked stool. Denies any complaints with it. Unable to state amount but states its just a little each time. No hx of same.

## 2011-12-18 ENCOUNTER — Emergency Department (HOSPITAL_COMMUNITY)
Admission: EM | Admit: 2011-12-18 | Discharge: 2011-12-18 | Disposition: A | Discharge status: Home / Self Care | Payer: Self-pay | Attending: Emergency Medicine | Admitting: Emergency Medicine

## 2011-12-18 DIAGNOSIS — I2510 Atherosclerotic heart disease of native coronary artery without angina pectoris: Secondary | ICD-10-CM | POA: Insufficient documentation

## 2011-12-18 DIAGNOSIS — R059 Cough, unspecified: Secondary | ICD-10-CM | POA: Insufficient documentation

## 2011-12-18 DIAGNOSIS — Z79899 Other long term (current) drug therapy: Secondary | ICD-10-CM | POA: Insufficient documentation

## 2011-12-18 DIAGNOSIS — R05 Cough: Secondary | ICD-10-CM | POA: Insufficient documentation

## 2011-12-18 DIAGNOSIS — R109 Unspecified abdominal pain: Secondary | ICD-10-CM | POA: Insufficient documentation

## 2011-12-18 DIAGNOSIS — J069 Acute upper respiratory infection, unspecified: Secondary | ICD-10-CM | POA: Insufficient documentation

## 2011-12-18 DIAGNOSIS — I10 Essential (primary) hypertension: Secondary | ICD-10-CM | POA: Insufficient documentation

## 2011-12-18 DIAGNOSIS — R1031 Right lower quadrant pain: Secondary | ICD-10-CM

## 2011-12-18 DIAGNOSIS — R070 Pain in throat: Secondary | ICD-10-CM | POA: Insufficient documentation

## 2011-12-18 DIAGNOSIS — J3489 Other specified disorders of nose and nasal sinuses: Secondary | ICD-10-CM | POA: Insufficient documentation

## 2011-12-18 LAB — URINALYSIS, ROUTINE W REFLEX MICROSCOPIC
Glucose, UA: NEGATIVE mg/dL
Hgb urine dipstick: NEGATIVE
Ketones, ur: NEGATIVE mg/dL
Leukocytes, UA: NEGATIVE
Protein, ur: NEGATIVE mg/dL
Urobilinogen, UA: 1 mg/dL (ref 0.0–1.0)

## 2011-12-18 NOTE — ED Notes (Signed)
Pt. Stated, I've had rt. Side pain for 2 months.

## 2011-12-18 NOTE — ED Provider Notes (Signed)
History     CSN: [id number]  Arrival date & time 12/18/11  1114   First MD Initiated Contact with Patient 12/18/11 1131      Chief Complaint  Patient presents with  . Flank Pain    (Consider location/radiation/quality/duration/timing/severity/associated sxs/prior treatment) HPI Comments: Patient reports that he has had intermittent right sided abdominal pain and right groin pain for the past 2 months.  He reports that the pain typically lasts for a day or two and will then resolve and later return.  He reports that he has not had pain like this before.  He denies any dysuria or hematuria.  He denies any fever, nausea, or vomiting.  He denies any history of kidney stones.  He describes the pain as a pressure and reports that it "feels like I have to urinate all the time."  He denies increased urinary frequency.  He reports that he has regular BM.  He also reports that he has been doing increased lifting at work over the last couple of months.  Pain worse with movement of the torso.  Patient is also complaining of a cough, rhinorrhea, and nasal congestion for the past 2-3 days.  He denies any fever, CP, SOB, headache, or myalgias.  He has not taken anything for his symptoms.    The history is provided by the patient.    Past Medical History  Diagnosis Date  . CAD (coronary artery disease)     Non obstructive  . Obesity   . History of syncope 08/2008    Secondary to hypertension  . Tobacco abuse   . Hypertension, essential, benign   . Chest pain, unspecified     No past surgical history on file.  Family History  Problem Relation Age of Onset  . Lupus Mother   . Heart disease Maternal Grandfather   . Coronary artery disease Neg Hx     History  Substance Use Topics  . Smoking status: Former Smoker -- 0.3 packs/day for 32 years  . Smokeless tobacco: Not on file   Comment: Started smoking at age 51 (34)  . Alcohol Use: No      Review of Systems  Constitutional:  Negative for fever, chills and diaphoresis.  HENT: Positive for congestion, sore throat and rhinorrhea. Negative for ear pain, trouble swallowing, neck pain, neck stiffness, voice change and sinus pressure.   Respiratory: Positive for cough. Negative for chest tightness and shortness of breath.   Cardiovascular: Negative for chest pain.  Gastrointestinal: Negative for nausea, vomiting, diarrhea, constipation, abdominal distention and rectal pain.  Genitourinary: Negative for dysuria, hematuria, decreased urine volume, discharge, scrotal swelling, difficulty urinating, penile pain and testicular pain.  Skin: Negative for rash.  Neurological: Negative for headaches.    Allergies  Penicillins  Home Medications   Current Outpatient Rx  Name Route Sig Dispense Refill  . AMLODIPINE BESYLATE 10 MG PO TABS Oral Take 10 mg by mouth daily.    Marland Kitchen BENAZEPRIL HCL 40 MG PO TABS Oral Take 1 tablet (40 mg total) by mouth daily. 30 tablet 11  . CLONIDINE HCL 0.2 MG PO TABS Oral Take 0.2 mg by mouth 2 (two) times daily.      . OMEGA-3 FATTY ACIDS 1000 MG PO CAPS Oral Take 1 g by mouth daily.      Marland Kitchen POTASSIUM CHLORIDE ER 10 MEQ PO TBCR Oral Take 10 mEq by mouth daily.    . TRIAMTERENE-HCTZ 37.5-25 MG PO TABS Oral Take 1 tablet by mouth  daily.    Marland Kitchen NITROGLYCERIN 0.4 MG SL SUBL Sublingual Place 0.4 mg under the tongue every 5 (five) minutes as needed. For chest pain      BP 151/94  Pulse 92  Temp 97.9 F (36.6 C)  Resp 20  SpO2 97%  Physical Exam  Nursing note and vitals reviewed. Constitutional: He is oriented to person, place, and time. He appears well-developed and well-nourished. No distress.  HENT:  Head: Normocephalic and atraumatic. No trismus in the jaw.  Right Ear: Tympanic membrane and external ear normal.  Left Ear: Tympanic membrane, external ear and ear canal normal.  Nose: Mucosal edema and rhinorrhea present. Right sinus exhibits no maxillary sinus tenderness and no frontal sinus  tenderness. Left sinus exhibits no maxillary sinus tenderness and no frontal sinus tenderness.  Mouth/Throat: Uvula is midline, oropharynx is clear and moist and mucous membranes are normal. No oropharyngeal exudate, posterior oropharyngeal edema, posterior oropharyngeal erythema or tonsillar abscesses.  Neck: Normal range of motion. Neck supple.  Cardiovascular: Normal rate, regular rhythm and normal heart sounds.   Pulmonary/Chest: Effort normal and breath sounds normal. No respiratory distress. He has no wheezes. He has no rales.  Abdominal: Soft. Bowel sounds are normal. He exhibits no distension and no mass. There is no tenderness. There is no rebound and no guarding. Hernia confirmed negative in the right inguinal area and confirmed negative in the left inguinal area.  Genitourinary: Testes normal and penis normal. Right testis shows no mass and no tenderness. Left testis shows no mass and no tenderness. Circumcised.  Musculoskeletal: Normal range of motion.  Lymphadenopathy:       Right: No inguinal adenopathy present.       Left: No inguinal adenopathy present.  Neurological: He is alert and oriented to person, place, and time.  Skin: Skin is warm and dry. No rash noted. He is not diaphoretic.  Psychiatric: He has a normal mood and affect.    ED Course  Procedures (including critical care time)   Labs Reviewed  URINALYSIS, ROUTINE W REFLEX MICROSCOPIC   No results found.   1. Upper respiratory infection   2. Right groin pain       MDM  Patient with intermittent right lower abdominal and right groin pain for the past two months. Pain is unchanged from pain 2 months ago.  Normal UA.  No inguinal hernia on exam.  Normal testicular exam.  Patient began having pain around the time he began doing heavier lifting at work and pain worse with movement.  Pain may be musculoskeletal.  Patient instructed to follow up with PCP.         Pascal Lux Register, PA-C 12/19/11 [phone]

## 2011-12-19 NOTE — ED Provider Notes (Signed)
Medical screening examination/treatment/procedure(s) were performed by non-physician practitioner and as supervising physician I was immediately available for consultation/collaboration.   Suzi Roots, MD 12/19/11 1034

## 2012-07-28 ENCOUNTER — Ambulatory Visit: Payer: Self-pay | Admitting: Cardiology

## 2012-08-10 ENCOUNTER — Encounter: Payer: Self-pay | Admitting: Cardiology

## 2012-08-10 ENCOUNTER — Ambulatory Visit (INDEPENDENT_AMBULATORY_CARE_PROVIDER_SITE_OTHER): Payer: Self-pay | Admitting: Cardiology

## 2012-08-10 VITALS — BP 190/108 | HR 94 | Ht 67.0 in | Wt 199.0 lb

## 2012-08-10 DIAGNOSIS — G4733 Obstructive sleep apnea (adult) (pediatric): Secondary | ICD-10-CM

## 2012-08-10 DIAGNOSIS — I10 Essential (primary) hypertension: Secondary | ICD-10-CM

## 2012-08-10 DIAGNOSIS — I2510 Atherosclerotic heart disease of native coronary artery without angina pectoris: Secondary | ICD-10-CM

## 2012-08-10 MED ORDER — METOPROLOL TARTRATE 25 MG PO TABS: 25.0000 mg | ORAL_TABLET | Freq: Two times a day (BID) | ORAL | Status: DC

## 2012-08-10 MED ORDER — BENAZEPRIL HCL 40 MG PO TABS: 40.0000 mg | ORAL_TABLET | Freq: Every day | ORAL | Status: DC

## 2012-08-10 MED ORDER — AMLODIPINE BESYLATE 5 MG PO TABS: 5.0000 mg | ORAL_TABLET | Freq: Every day | ORAL | Status: DC

## 2012-08-10 MED ORDER — TRIAMTERENE-HCTZ 37.5-25 MG PO TABS: 1.0000 | ORAL_TABLET | Freq: Every day | ORAL | Status: DC

## 2012-08-10 MED ORDER — NITROGLYCERIN 0.4 MG SL SUBL: 0.4000 mg | SUBLINGUAL_TABLET | SUBLINGUAL | Status: DC | PRN

## 2012-08-10 NOTE — Patient Instructions (Addendum)
Your physician has recommended you make the following change in your medication: START Amlodipine 5mg  take one by mouth daily, START Metoprolol Tartrate 25mg  take one by mouth twice a day  Your physician recommends that you have lab work today: [facility]  Your physician recommends that you schedule a follow-up appointment tomorrow. (08/11/12 at 1:30 with Norma Fredrickson NP)

## 2012-08-11 ENCOUNTER — Ambulatory Visit: Payer: Self-pay | Admitting: Nurse Practitioner

## 2012-08-11 LAB — BASIC METABOLIC PANEL
CO2: 27 mEq/L (ref 19–32)
Calcium: 9.7 mg/dL (ref 8.4–10.5)
Creatinine, Ser: 1.6 mg/dL — ABNORMAL HIGH (ref 0.4–1.5)
GFR: 62.21 mL/min (ref >60.00)
Glucose, Bld: 93 mg/dL (ref 70–99)

## 2012-08-11 NOTE — Assessment & Plan Note (Signed)
The patient has obstructive sleep apnea, but has been refusing treatment in the past. He says he sleeps just fine. I discussed the relationship of hypertension.

## 2012-08-11 NOTE — Assessment & Plan Note (Deleted)
His blood pressure is obviously poorly controlled. Because of his constant headaches, I recommended he go emergency room and that we attempt to try to get his pressure down there with the addition of beta blockade and possibly vasodilator. He however refused today. His blood pressure is improved from what was when he was seen in urgent care. We will add a blockers, and I will also resume his amlodipine. In order to not overdo it, we will make arrangements for him to come back to the clinic tomorrow be reassessed with regard to his blood pressure. We will make every attempt to do this as an outpatient as the patient has wished and desired and we will get labs. He's agreed to return tomorrow for a followup visit. He will be seen by one of our extenders.    

## 2012-08-11 NOTE — Progress Notes (Signed)
HPI:  The patient comes in today for followup. Unfortunately, he is been followed at [facility], and they closed. As such, he is been unable to get his antihypertensive medications, and he was subtotally seen in urgent care. According to the patient, and urgent care they started to his medications. He's not has blood pressure checked. He's continued to have rather significant headaches. He's time pressure today. He has to be on the job later, and I encouraged him to go to the emergency room where we could get his pressure down, and quickly get him on a regimen to control his pressure. Patient denies any chest pain. He declined, and agreed to sign a waiver. He is very reasonable, as he is worried about his job and the fact that no one is available to do it.  Since I last saw him, his medications were adjusted at [facility] and he was placed on clonidine.  Current Outpatient Prescriptions  Medication Sig Dispense Refill  . benazepril (LOTENSIN) 40 MG tablet Take 1 tablet (40 mg total) by mouth daily.  30 tablet  11  . fish oil-omega-3 fatty acids 1000 MG capsule Take 1 g by mouth daily.        . nitroGLYCERIN (NITROSTAT) 0.4 MG SL tablet Place 1 tablet (0.4 mg total) under the tongue every 5 (five) minutes as needed. For chest pain  25 tablet  3  . triamterene-hydrochlorothiazide (MAXZIDE-25) 37.5-25 MG per tablet Take 1 each (1 tablet total) by mouth daily.  30 tablet  11  . amLODipine (NORVASC) 5 MG tablet Take 1 tablet (5 mg total) by mouth daily.  30 tablet  11  . metoprolol tartrate (LOPRESSOR) 25 MG tablet Take 1 tablet (25 mg total) by mouth 2 (two) times daily.  60 tablet  11    Allergies  Allergen Reactions  . Penicillins Other (See Comments)    Patient states that it makes his throat itch really bad, and feels like it is closing up.     Past Medical History  Diagnosis Date  . CAD (coronary artery disease)     Non obstructive  . Obesity   . History of syncope 08/2008   Secondary to hypertension  . Tobacco abuse   . Hypertension, essential, benign   . Chest pain, unspecified     No past surgical history on file.  Family History  Problem Relation Age of Onset  . Lupus Mother   . Heart disease Maternal Grandfather   . Coronary artery disease Neg Hx     History   Social History  . Marital Status: Divorced    Spouse Name: N/A    Number of Children: N/A  . Years of Education: N/A   Occupational History  . CNA    Social History Main Topics  . Smoking status: Former Smoker -- 0.3 packs/day for 32 years  . Smokeless tobacco: Not on file   Comment: Started smoking at age 73 (49)  . Alcohol Use: No  . Drug Use: No  . Sexually Active: Not on file   Other Topics Concern  . Not on file   Social History Narrative   Lives in [place], but has a Immunologist with uncleSeparated from his wife    ROS: Please see the HPI.  All other systems reviewed and negative.  PHYSICAL EXAM:  BP 190/108  Pulse 94  Ht 5\' 7"  (1.702 m)  Wt 199 lb (90.266 kg)  BMI 31.17 kg/m2  SpO2 98%  General:  Well developed, well nourished, in no acute distress. Eyes:  Despite a dark room for twenty minutes, able to see fundi, but modest constriction and unable to see discs well.   Head:  Normocephalic and atraumatic. Neck: no JVD Lungs: Clear to auscultation and percussion. Heart: Normal S1 and S2.  S4 gallop Abdomen:  Normal bowel sounds; soft; non tender; no organomegaly Pulses: Pulses normal in all 4 extremities. Extremities: No clubbing or cyanosis. No edema. Neurologic: Alert and oriented x 3.  EKG:  LVH on recent tracing.    Cath 2009  CONCLUSIONS:  1. Preserved LV function.  2. Scattered coronary artery disease.  RECOMMENDATIONS:  1. We will do serial enzymes.  2. We will highly recommend that the patient attempt to stop smoking  with well-recognized programs, including setting a date and 2-week  followup.  3. We may add some  nitrates to his overall regimen. At the present  time, he will be on a medical regimen.  4. We will add aspirin.  5. He will require an exercise tolerance test when stabilized.  Arturo Morton. Riley Kill, MD, [facility]  Electronically Signed  TDS/MEDQ D: 11/11/2008 T: 11/12/2008 Job: [id number]   ASSESSMENT AND PLAN:

## 2012-08-11 NOTE — Assessment & Plan Note (Signed)
His blood pressure is obviously poorly controlled. Because of his constant headaches, I recommended he go emergency room and that we attempt to try to get his pressure down there with the addition of beta blockade and possibly vasodilator. He however refused today. His blood pressure is improved from what was when he was seen in urgent care. We will add a blockers, and I will also resume his amlodipine. In order to not overdo it, we will make arrangements for him to come back to the clinic tomorrow be reassessed with regard to his blood pressure. We will make every attempt to do this as an outpatient as the patient has wished and desired and we will get labs. He's agreed to return tomorrow for a followup visit. He will be seen by one of our extenders.

## 2012-08-16 ENCOUNTER — Encounter: Payer: Self-pay | Admitting: Nurse Practitioner

## 2012-08-16 ENCOUNTER — Ambulatory Visit (INDEPENDENT_AMBULATORY_CARE_PROVIDER_SITE_OTHER): Payer: Self-pay | Admitting: Nurse Practitioner

## 2012-08-16 VITALS — BP 164/100 | HR 92 | Ht 67.0 in | Wt 197.8 lb

## 2012-08-16 DIAGNOSIS — I10 Essential (primary) hypertension: Secondary | ICD-10-CM

## 2012-08-16 LAB — BASIC METABOLIC PANEL
BUN: 16 mg/dL (ref 6–23)
CO2: 29 mEq/L (ref 19–32)
Calcium: 9 mg/dL (ref 8.4–10.5)
Chloride: 98 mEq/L (ref 96–112)
Creatinine, Ser: 1.5 mg/dL (ref 0.4–1.5)
GFR: 65.1 mL/min (ref >60.00)
Glucose, Bld: 93 mg/dL (ref 70–99)
Potassium: 3.2 mEq/L — ABNORMAL LOW (ref 3.5–5.1)
Sodium: 138 mEq/L (ref 135–145)

## 2012-08-16 MED ORDER — HYDRALAZINE HCL 25 MG PO TABS: 25.0000 mg | ORAL_TABLET | Freq: Three times a day (TID) | ORAL | Status: DC

## 2012-08-16 NOTE — Patient Instructions (Signed)
Stay on your current medicines  We are adding Hydralazine 25 mg three times a day - this is at [facility]  I want to see you in about 10 days  We are going to check some lab today  Avoid salt  Call the [facility] office at [phone] if you have any questions, problems or concerns.

## 2012-08-16 NOTE — Progress Notes (Signed)
[name] Date of Birth: 01-08-1966 Medical Record #[id number]  History of Present Illness: [name] is seen back today for a follow up visit. He is seen for Dr. Riley Kill. He has HTN. Was here last week to get his BP medicines. He had been off all of his medicines. They were restarted. Admission to the hospital was recommended but he refused.   He comes in today. He is here alone. He is doing ok. Back on his medicines. Headaches are improved. No complaints of chest pain, shortness of breath or dizziness. Seems to be tolerating his medicines without issue. Says he has been on more medicines than currently taking. Does not have a means of checking his BP at home. He was previously followed at [facility].   Current Outpatient Prescriptions on File Prior to Visit  Medication Sig Dispense Refill  . amLODipine (NORVASC) 5 MG tablet Take 1 tablet (5 mg total) by mouth daily.  30 tablet  11  . benazepril (LOTENSIN) 40 MG tablet Take 1 tablet (40 mg total) by mouth daily.  30 tablet  11  . fish oil-omega-3 fatty acids 1000 MG capsule Take 1 g by mouth daily.        . metoprolol tartrate (LOPRESSOR) 25 MG tablet Take 1 tablet (25 mg total) by mouth 2 (two) times daily.  60 tablet  11  . triamterene-hydrochlorothiazide (MAXZIDE-25) 37.5-25 MG per tablet Take 1 each (1 tablet total) by mouth daily.  30 tablet  11  . nitroGLYCERIN (NITROSTAT) 0.4 MG SL tablet Place 1 tablet (0.4 mg total) under the tongue every 5 (five) minutes as needed. For chest pain  25 tablet  3    Allergies  Allergen Reactions  . Penicillins Other (See Comments)    Patient states that it makes his throat itch really bad, and feels like it is closing up.     Past Medical History  Diagnosis Date  . CAD (coronary artery disease)     Non obstructive  . Obesity   . History of syncope 08/2008    Secondary to hypertension  . Tobacco abuse   . Hypertension, essential, benign   . Chest pain, unspecified     History  reviewed. No pertinent past surgical history.  History  Smoking status  . Current Some Day Smoker -- 0.3 packs/day for 32 years  Smokeless tobacco  . Not on file  Comment: Started smoking at age 80 (67)    History  Alcohol Use No    Family History  Problem Relation Age of Onset  . Lupus Mother   . Heart disease Maternal Grandfather   . Coronary artery disease Neg Hx     Review of Systems: The review of systems is per the HPI.  All other systems were reviewed and are negative.  Physical Exam: BP 182/112  Pulse 92  Ht 5\' 7"  (1.702 m)  Wt 197 lb 12.8 oz (89.721 kg)  BMI 30.98 kg/m2 Repeat BP by me is 164/100. Patient is very pleasant and in no acute distress. Skin is warm and dry. Color is normal.  HEENT is unremarkable. Normocephalic/atraumatic. PERRL. Sclera are nonicteric. Neck is supple. No masses. No JVD. Lungs are clear. Cardiac exam shows a regular rate and rhythm. Abdomen is soft. Extremities are without edema. Gait and ROM are intact. No gross neurologic deficits noted.  LABORATORY DATA: BMET is pending for today.   Lab Results  Component Value Date   WBC 7.0 11/03/2011   HGB 15.0 11/03/2011  HCT 44.0 11/03/2011   PLT 341 11/03/2011   GLUCOSE 93 08/10/2012   ALT 14 11/12/2008   AST 16 11/12/2008   NA 138 08/10/2012   K 3.5 08/10/2012   CL 101 08/10/2012   CREATININE 1.6* 08/10/2012   BUN 25* 08/10/2012   CO2 27 08/10/2012   INR 0.9 11/11/2008   Assessment / Plan: 1. HTN - Back on his medicines. BP is improving. I have added Hydralazine 25 mg TID. He does not use salt. I will see him back in about 10 days  2. CRI - recheck BMET today  3. Tobacco abuse - says he is trying to stop. Cessation is encouraged.  4. OSA - has refused treatment in the past.  Overall, he looks better. Will see him back in about 10 days. Recheck his labs today. Hydralazine is added. Patient is agreeable to this plan and will call if any problems develop in the interim.

## 2012-08-17 ENCOUNTER — Telehealth: Payer: Self-pay | Admitting: *Deleted

## 2012-08-17 NOTE — Telephone Encounter (Signed)
Discussed lab results with patient and patient agreed to increase his oral intake of Potassium.  Will be back in September 28, 2012.  Vista Mink, CMA

## 2012-08-17 NOTE — Telephone Encounter (Signed)
Message copied by Awilda Bill on Thu Aug 17, 2012 11:23 AM ------      Message from: Rosalio Macadamia      Created: Wed Aug 16, 2012  4:10 PM       Ok to report. Labs are satisfactory except for potassium being low. Does he think he could increase his foods higher in potassium? Recheck on return visit.

## 2012-08-28 ENCOUNTER — Ambulatory Visit: Payer: Self-pay | Admitting: Nurse Practitioner

## 2012-10-05 ENCOUNTER — Encounter: Payer: Self-pay | Admitting: Nurse Practitioner

## 2013-03-13 ENCOUNTER — Ambulatory Visit: Payer: Self-pay | Admitting: Nurse Practitioner

## 2013-03-17 ENCOUNTER — Emergency Department (HOSPITAL_COMMUNITY)
Admission: EM | Admit: 2013-03-17 | Discharge: 2013-03-17 | Disposition: A | Discharge status: Home / Self Care | Payer: Self-pay | Attending: Emergency Medicine | Admitting: Emergency Medicine

## 2013-03-17 ENCOUNTER — Emergency Department (HOSPITAL_COMMUNITY): Payer: Self-pay

## 2013-03-17 ENCOUNTER — Encounter (HOSPITAL_COMMUNITY): Payer: Self-pay | Admitting: Emergency Medicine

## 2013-03-17 DIAGNOSIS — Z79899 Other long term (current) drug therapy: Secondary | ICD-10-CM | POA: Insufficient documentation

## 2013-03-17 DIAGNOSIS — F17200 Nicotine dependence, unspecified, uncomplicated: Secondary | ICD-10-CM | POA: Insufficient documentation

## 2013-03-17 DIAGNOSIS — E669 Obesity, unspecified: Secondary | ICD-10-CM | POA: Insufficient documentation

## 2013-03-17 DIAGNOSIS — H539 Unspecified visual disturbance: Secondary | ICD-10-CM

## 2013-03-17 DIAGNOSIS — I10 Essential (primary) hypertension: Secondary | ICD-10-CM | POA: Insufficient documentation

## 2013-03-17 DIAGNOSIS — R51 Headache: Secondary | ICD-10-CM | POA: Insufficient documentation

## 2013-03-17 DIAGNOSIS — H538 Other visual disturbances: Secondary | ICD-10-CM | POA: Insufficient documentation

## 2013-03-17 DIAGNOSIS — Z8669 Personal history of other diseases of the nervous system and sense organs: Secondary | ICD-10-CM | POA: Insufficient documentation

## 2013-03-17 DIAGNOSIS — Z8679 Personal history of other diseases of the circulatory system: Secondary | ICD-10-CM | POA: Insufficient documentation

## 2013-03-17 DIAGNOSIS — Z880 Allergy status to penicillin: Secondary | ICD-10-CM | POA: Insufficient documentation

## 2013-03-17 DIAGNOSIS — I2510 Atherosclerotic heart disease of native coronary artery without angina pectoris: Secondary | ICD-10-CM | POA: Insufficient documentation

## 2013-03-17 LAB — POCT I-STAT, CHEM 8
HCT: 46 % (ref 39.0–52.0)
Hemoglobin: 15.6 g/dL (ref 13.0–17.0)
Potassium: 3.6 mEq/L (ref 3.5–5.1)
Sodium: 142 mEq/L (ref 135–145)
TCO2: 32 mmol/L (ref 0–100)

## 2013-03-17 MED ORDER — CYCLOPENTOLATE HCL 1 % OP SOLN
1.0000 [drp] | Freq: Once | OPHTHALMIC | Status: DC
  Filled 2013-03-17: qty 2

## 2013-03-17 MED ORDER — HYDRALAZINE HCL 20 MG/ML IJ SOLN
10.0000 mg | Freq: Once | INTRAMUSCULAR | Status: AC
  Administered 2013-03-17: 10 mg via INTRAVENOUS
  Filled 2013-03-17: qty 1

## 2013-03-17 MED ORDER — ENALAPRILAT 1.25 MG/ML IV SOLN
1.2500 mg | Freq: Once | INTRAVENOUS | Status: AC
  Administered 2013-03-17: 1.25 mg via INTRAVENOUS
  Filled 2013-03-17: qty 1

## 2013-03-17 MED ORDER — NAPROXEN 500 MG PO TABS: 500.0000 mg | ORAL_TABLET | Freq: Two times a day (BID) | ORAL | Status: DC

## 2013-03-17 MED ORDER — METOPROLOL TARTRATE 25 MG PO TABS
25.0000 mg | ORAL_TABLET | Freq: Once | ORAL | Status: AC
  Administered 2013-03-17: 25 mg via ORAL
  Filled 2013-03-17: qty 1

## 2013-03-17 MED ORDER — PROCHLORPERAZINE EDISYLATE 5 MG/ML IJ SOLN
10.0000 mg | Freq: Once | INTRAMUSCULAR | Status: AC
  Administered 2013-03-17: 10 mg via INTRAVENOUS
  Filled 2013-03-17: qty 2

## 2013-03-17 MED ORDER — VALPROATE SODIUM 500 MG/5ML IV SOLN
500.0000 mg | Freq: Once | INTRAVENOUS | Status: AC
  Administered 2013-03-17: 500 mg via INTRAVENOUS
  Filled 2013-03-17: qty 5

## 2013-03-17 MED ORDER — SODIUM CHLORIDE 0.9 % IV BOLUS (SEPSIS)
1000.0000 mL | Freq: Once | INTRAVENOUS | Status: AC
  Administered 2013-03-17: 1000 mL via INTRAVENOUS

## 2013-03-17 MED ORDER — METOCLOPRAMIDE HCL 5 MG/ML IJ SOLN
10.0000 mg | Freq: Once | INTRAMUSCULAR | Status: AC
  Administered 2013-03-17: 10 mg via INTRAVENOUS
  Filled 2013-03-17: qty 2

## 2013-03-17 MED ORDER — BENAZEPRIL HCL 40 MG PO TABS: 40.0000 mg | ORAL_TABLET | Freq: Every day | ORAL | Status: DC

## 2013-03-17 MED ORDER — TROPICAMIDE 0.5 % OP SOLN
1.0000 [drp] | Freq: Once | OPHTHALMIC | Status: AC
  Administered 2013-03-17: 1 [drp] via OPHTHALMIC
  Filled 2013-03-17 (×2): qty 15

## 2013-03-17 MED ORDER — BUTALBITAL-APAP-CAFF-COD 50-325-40-30 MG PO CAPS: 1.0000 | ORAL_CAPSULE | ORAL | Status: DC | PRN

## 2013-03-17 MED ORDER — DIPHENHYDRAMINE HCL 50 MG/ML IJ SOLN
25.0000 mg | Freq: Once | INTRAMUSCULAR | Status: AC
  Administered 2013-03-17: 25 mg via INTRAVENOUS
  Filled 2013-03-17: qty 1

## 2013-03-17 MED ORDER — KETOROLAC TROMETHAMINE 30 MG/ML IJ SOLN
30.0000 mg | Freq: Once | INTRAMUSCULAR | Status: AC
  Administered 2013-03-17: 30 mg via INTRAVENOUS
  Filled 2013-03-17: qty 1

## 2013-03-17 MED ORDER — MORPHINE SULFATE 4 MG/ML IJ SOLN
4.0000 mg | Freq: Once | INTRAMUSCULAR | Status: AC
  Administered 2013-03-17: 4 mg via INTRAVENOUS
  Filled 2013-03-17: qty 1

## 2013-03-17 NOTE — ED Notes (Signed)
Pt,. Stated, I've had a headache for about a week now and started having some blurred vision this morning

## 2013-03-17 NOTE — ED Notes (Signed)
Attempted IV x 2. Unsuccessful. Another RN will assess.

## 2013-03-17 NOTE — ED Provider Notes (Signed)
History     CSN: [id number]  Arrival date & time 03/17/13  0917   First MD Initiated Contact with Patient 03/17/13 1109      Chief Complaint  Patient presents with  . Headache    (Consider location/radiation/quality/duration/timing/severity/associated sxs/prior treatment) HPI  [name] is a 47 y.o. male with past medical history of hypertension, nonobstructive coronary artery disease and tobacco use complaining of bilateral frontal headache described as throbbing,  8/10 x7 days. Patient states that he woke this a.m. and had reduced vision left eye, he states this has improved but he still has blurry vision. Patient has been out of his Lotensin blood pressure medication for 3 days, has been compliant with other HTN Rx. Patient has had no tried any pain medications prior to arrival. Patient denies eye pain, fever, stiff neck, nausea vomiting, chest pain, shortness of breath, dysarthria, ataxia, focal weakness. Patient has had multiple prior headache exacerbations however this one is different in both severity and location.    Past Medical History  Diagnosis Date  . CAD (coronary artery disease)     Non obstructive  . Obesity   . History of syncope 08/2008    Secondary to hypertension  . Tobacco abuse   . Hypertension, essential, benign   . Chest pain, unspecified     History reviewed. No pertinent past surgical history.  Family History  Problem Relation Age of Onset  . Lupus Mother   . Heart disease Maternal Grandfather   . Coronary artery disease Neg Hx     History  Substance Use Topics  . Smoking status: Current Some Day Smoker -- 0.30 packs/day for 32 years  . Smokeless tobacco: Not on file     Comment: Started smoking at age 27 (24)  . Alcohol Use: No      Review of Systems  Constitutional: Negative for fever.  Eyes: Positive for visual disturbance.  Respiratory: Negative for shortness of breath.   Cardiovascular: Negative for chest pain.   Gastrointestinal: Negative for nausea, vomiting, abdominal pain and diarrhea.  Neurological: Positive for headaches. Negative for dizziness and facial asymmetry.  All other systems reviewed and are negative.    Allergies  Penicillins  Home Medications   Current Outpatient Rx  Name  Route  Sig  Dispense  Refill  . amLODipine (NORVASC) 5 MG tablet   Oral   Take 1 tablet (5 mg total) by mouth daily.   30 tablet   11   . benazepril (LOTENSIN) 40 MG tablet   Oral   Take 1 tablet (40 mg total) by mouth daily.   30 tablet   11   . fish oil-omega-3 fatty acids 1000 MG capsule   Oral   Take 1 g by mouth daily.           . hydrALAZINE (APRESOLINE) 25 MG tablet   Oral   Take 1 tablet (25 mg total) by mouth 3 (three) times daily.   90 tablet   6   . metoprolol tartrate (LOPRESSOR) 25 MG tablet   Oral   Take 1 tablet (25 mg total) by mouth 2 (two) times daily.   60 tablet   11   . nitroGLYCERIN (NITROSTAT) 0.4 MG SL tablet   Sublingual   Place 1 tablet (0.4 mg total) under the tongue every 5 (five) minutes as needed. For chest pain   25 tablet   3   . triamterene-hydrochlorothiazide (MAXZIDE-25) 37.5-25 MG per tablet   Oral  Take 1 each (1 tablet total) by mouth daily.   30 tablet   11     BP 201/126  Pulse 80  Temp(Src) 98.1 F (36.7 C) (Oral)  Resp 16  SpO2 98%  Physical Exam  Nursing note and vitals reviewed. Constitutional: He is oriented to person, place, and time. He appears well-developed and well-nourished. No distress.  HENT:  Head: Normocephalic.  Eyes: Conjunctivae and EOM are normal. Pupils are equal, round, and reactive to light. Pupils are equal.  Dilated Left Eye with 2 drops mydriacyl,   No papilledema, retinal pallor, blood and thunder pupil.   Neck: Normal range of motion.  Cardiovascular: Normal rate.   Pulmonary/Chest: Effort normal and breath sounds normal. No stridor. No respiratory distress. He has no wheezes. He has no rales. He  exhibits no tenderness.  Abdominal: Soft. There is no tenderness. There is no rebound and no guarding.  Musculoskeletal: Normal range of motion.  Neurological: He is alert and oriented to person, place, and time.  Cranial nerves III through XII intact, strength 5 out of 5x4 extremities, negative pronator drift, finger to nose and heel-to-shin coordinated, sensation intact to pinprick and light touch, gait is coordinated and Romberg is negative.    Psychiatric: He has a normal mood and affect.    ED Course  Procedures (including critical care time)  Labs Reviewed  POCT I-STAT, CHEM 8 - Abnormal; Notable for the following:    Creatinine, Ser 1.50 (*)    All other components within normal limits   Ct Head Wo Contrast  03/17/2013  *RADIOLOGY REPORT*  Clinical Data: Headache.  Dizziness and hypertension.  CT HEAD WITHOUT CONTRAST  Technique:  Contiguous axial images were obtained from the base of the skull through the vertex without contrast.  Comparison: None.  Findings: The brain stem, cerebellum, cerebral peduncles, thalami, basal ganglia, basilar cisterns, and ventricular system appear unremarkable.  No intracranial hemorrhage, mass lesion, or acute infarction is identified.  IMPRESSION:  No significant abnormality identified.   Original Report Authenticated By: Gaylyn Rong, M.D.    Medications  sodium chloride 0.9 % bolus 1,000 mL (1,000 mLs Intravenous New Bag/Given 03/17/13 1240)  metoCLOPramide (REGLAN) injection 10 mg (10 mg Intravenous Given 03/17/13 1243)  diphenhydrAMINE (BENADRYL) injection 25 mg (25 mg Intravenous Given 03/17/13 1241)  metoprolol tartrate (LOPRESSOR) tablet 25 mg (25 mg Oral Given 03/17/13 1254)  tropicamide (MYDRIACYL) 0.5 % ophthalmic solution 1 drop (1 drop Left Eye Given 03/17/13 1413)  enalaprilat (VASOTEC) injection 1.25 mg (1.25 mg Intravenous Given 03/17/13 1329)  morphine 4 MG/ML injection 4 mg (4 mg Intravenous Given 03/17/13 1340)  prochlorperazine  (COMPAZINE) injection 10 mg (10 mg Intravenous Given 03/17/13 1457)  ketorolac (TORADOL) 30 MG/ML injection 30 mg (30 mg Intravenous Given 03/17/13 1456)  valproate (DEPACON) 500 mg in dextrose 5 % 50 mL IVPB (0 mg Intravenous Stopped 03/17/13 1623)  hydrALAZINE (APRESOLINE) injection 10 mg (10 mg Intravenous Given 03/17/13 1550)     Visual Acuity        03/17/13 1333                   Visual Acuity   Bilateral Near  20/40    Bilateral Distance      R Near  20/30    R Distance      L Near  20/40    L Distance              3:25 PM HA slightly improved to  7/10 after compazine and toradol. Pressure is 190/105. D/w attending plan is to give Depakote and hydralaizine  1. TOBACCO ABUSE   2. HTN (hypertension)   3. Headache   4. Visual changes       MDM   [name] is a 47 y.o. male with headache and resolving decreased visual acuity in left eye.  Is extremely elevated blood pressure at 201/186. Neurologic exam is normal. Head CT and headache cocktail with fluids ordered.   Head CT is normal, headache remains at 8/10 after Benadryl and Reglan. Limited retinal exam shows no abnormalities. Patient's visual acuity is 20/40 in the affected eye and 20/30 on the right. Creatinine is mildly elevated at 1.5 which is his baseline.  Patient had significant improvement in his headache after Depakote bradycardia at a 4/10. His blood pressure remains elevated at 195/112. I have offered the patient admission and he would like to go home. I have advised him that he must fill his blood pressure medications, reduce salt intake, and had a blood pressure recheck either at Dr. Rosalyn Charters office or to [facility] in the next 48 hours. Patient verbalized his understanding back to me.  This is a shared visit with the attending physician who personally evaluated the patient and agrees with the care plan.    Filed Vitals:   03/17/13 1600 03/17/13 1615 03/17/13 1630 03/17/13 1642   BP: 169/115 210/123 222/113 195/112  Pulse: 76 54 52   Temp:    98.1 F (36.7 C)  TempSrc:      Resp: 22 11 11    SpO2: 99% 99% 99% 100%     Pt verbalized understanding and agrees with care plan. Outpatient follow-up and return precautions given.    New Prescriptions   BENAZEPRIL (LOTENSIN) 40 MG TABLET    Take 1 tablet (40 mg total) by mouth daily.   BUTALBITAL-ACETAMINOPHEN-CAFFEINE (FIORICET/CODEINE) 50-325-40-30 MG PER CAPSULE    Take 1 capsule by mouth every 4 (four) hours as needed for headache.   NAPROXEN (NAPROSYN) 500 MG TABLET    Take 1 tablet (500 mg total) by mouth 2 (two) times daily with a meal.           Wynetta Emery, PA-C 03/17/13 1646

## 2013-03-17 NOTE — ED Provider Notes (Signed)
47 year old male, history of headaches when he was younger who presents with a complaint of recurrent headaches which are frontal, throbbing and started 8 days ago when he awoke from sleep. He is unsure if the onset was acute but states that this is one of the more intense headaches that he has ever had. It does not radiate, does not involve weakness or numbness of his arms or legs or difficulty with speaking or swallowing. He has no nausea or vomiting, no significant photosensitivity but yesterday noted decreased vision in his left eye that he describes as blurry when she fell this morning became almost total loss of vision in the left eye. This has since resolved and his vision is back to what it was. He is a very poor historian regarding his visual changes, he denies losing all vision in that eye but states that it was significantly blurry.  On my exam the patient has a normal HEENT exam including oropharynx, nasal passages, eyes and ears. He has normal extraocular movements, normal pupillary exam, visual acuity is grossly normal, peripheral visual fields are normal in both eyes tested independently, no ptosis, normal speech, normal coordination, normal strength, no ataxia of all 4 extremities. Heart and lungs appear clear and regular.  The patient's blood pressure is significantly elevated and he admits to being out of his ACE inhibitor, taking all his other medications as prescribed per his report.  Will aggressively treat blood pressure, pain control, CT scan of the head and reevaluate. Dilated exam of the eye pending  14:40 - eye dilated and posterior exam shows no obvious papilledema, normal appearing optic disk.    The patient had persistent hypertension which improves slightly with medications, he was offered admission to the hospital for ongoing treatment of his hypertension but he declined. He had no further ophthalmologic abnormalities, I specifically recommended that he follow up very closely  with his family doctor for blood pressure recheck or return if his symptoms worsened and he expressed his understanding.  Medical screening examination/treatment/procedure(s) were conducted as a shared visit with non-physician practitioner(s) and myself.  I personally evaluated the patient during the encounter    Vida Roller, MD 03/18/13 [phone]

## 2013-03-17 NOTE — ED Notes (Signed)
Pt made aware that he needs to follow up with neurology and PCP in 2 days. Also made aware to come back ED for neurological deficits, chest pain, continued HTN, or headache. Pt made aware not to drive on pain medications. Pt aware that he cannot drive today. EDP is aware of Blood Pressure and states it is okay for pt to be discharged.

## 2013-03-17 NOTE — ED Notes (Signed)
Pt. Stated, I've been out of one of my BP medicine for 2 days.

## 2013-03-17 NOTE — ED Notes (Signed)
Rt AC IV removed.

## 2013-03-17 NOTE — ED Notes (Signed)
Pt stated that he ran out of 1 of his blood pressure medications. Pt stated that he has been having a headache x 2days, and he started having blurred vision this morning. Currently does not have blurred vision but he does still have a headache. No numbness or tingling. No weakness. Pt is alert and oriented. No chest pain at this time. However pt stated that he had really bad Chest pain 4 days ago, and did not get it checked out. No respiratory distress. Will continue to monitor.

## 2013-03-18 NOTE — ED Provider Notes (Signed)
  Medical screening examination/treatment/procedure(s) were conducted as a shared visit with non-physician practitioner(s) and myself.  I personally evaluated the patient during the encounter  Please see my separate respective documentation pertaining to this patient encounter   Vida Roller, MD 03/18/13 1104

## 2013-03-21 ENCOUNTER — Encounter: Payer: Self-pay | Admitting: Nurse Practitioner

## 2013-04-27 ENCOUNTER — Encounter: Payer: Self-pay | Admitting: Nurse Practitioner

## 2013-04-27 ENCOUNTER — Ambulatory Visit (INDEPENDENT_AMBULATORY_CARE_PROVIDER_SITE_OTHER): Payer: Self-pay | Admitting: Nurse Practitioner

## 2013-04-27 VITALS — BP 160/104 | HR 72 | Ht 67.0 in | Wt 195.1 lb

## 2013-04-27 DIAGNOSIS — I10 Essential (primary) hypertension: Secondary | ICD-10-CM

## 2013-04-27 MED ORDER — CLONIDINE HCL 0.1 MG PO TABS: 0.1000 mg | ORAL_TABLET | Freq: Two times a day (BID) | ORAL | Status: DC

## 2013-04-27 NOTE — Patient Instructions (Addendum)
Stay on your current medicines but stop the Hydralazine and add back your Clonidine 0.1 mg two times a day  We need to see you next week for a BP check.  I will see you in 2 weeks  We are going to check the arteries to your kidneys to make sure they look ok and not making your BP stay up.  Avoid the fast food - it has too much salt  Smoking cessation is encouraged.    Call the [facility] office at [phone] if you have any questions, problems or concerns.

## 2013-04-27 NOTE — Progress Notes (Signed)
[name] Date of Birth: October 14, 1966 Medical Record #[id number]  History of Present Illness: Mr. Kanner is seen back today for a follow up visit. Seen for Dr. Riley Kill. Has not been seen here since September. Numerous missed appointments. He has HTN, CRI, OSA with refusal to wear CPAP, nonobstructive CAD, ED and tobacco abuse.   Last seen in September and his BP was quite elevated. Hydralazine was added. He did not keep his follow up visit.   Comes back today. He is here alone. He is doing ok. Has been to the ER recently for headaches - BP was sky high. Refused admission. Only taking half the hydralazine once a day. Says that makes his head hurt. Says he is taking medicines. Eats fast food (likes [employer] Slick[employer]). Complaining of ED. Still smoking some. He remains on CCB, BB, diuretic and ACE. No swelling. No chest pain. Not short of breath.    Current Outpatient Prescriptions on File Prior to Visit  Medication Sig Dispense Refill  . amLODipine (NORVASC) 5 MG tablet Take 1 tablet (5 mg total) by mouth daily.  30 tablet  11  . benazepril (LOTENSIN) 40 MG tablet Take 1 tablet (40 mg total) by mouth daily.  30 tablet  11  . fish oil-omega-3 fatty acids 1000 MG capsule Take 1 g by mouth daily.        . metoprolol tartrate (LOPRESSOR) 25 MG tablet Take 1 tablet (25 mg total) by mouth 2 (two) times daily.  60 tablet  11  . nitroGLYCERIN (NITROSTAT) 0.4 MG SL tablet Place 1 tablet (0.4 mg total) under the tongue every 5 (five) minutes as needed. For chest pain  25 tablet  3  . triamterene-hydrochlorothiazide (MAXZIDE-25) 37.5-25 MG per tablet Take 1 each (1 tablet total) by mouth daily.  30 tablet  11   No current facility-administered medications on file prior to visit.    Allergies  Allergen Reactions  . Penicillins Other (See Comments)    Patient states that it makes his throat itch really bad, and feels like it is closing up.     Past Medical History  Diagnosis Date  . CAD (coronary artery  disease)     Non obstructive  . Obesity   . History of syncope 08/2008    Secondary to hypertension  . Tobacco abuse   . Hypertension, essential, benign   . Chest pain, unspecified   . Noncompliance     History reviewed. No pertinent past surgical history.  History  Smoking status  . Current Some Day Smoker -- 0.30 packs/day for 32 years  Smokeless tobacco  . Not on file    Comment: Started smoking at age 29 (54)    History  Alcohol Use No    Family History  Problem Relation Age of Onset  . Lupus Mother   . Heart disease Maternal Grandfather   . Coronary artery disease Neg Hx     Review of Systems: The review of systems is per the HPI.  All other systems were reviewed and are negative.  Physical Exam: BP 160/104  Pulse 72  Ht 5\' 7"  (1.702 m)  Wt 195 lb 1.9 oz (88.506 kg)  BMI 30.55 kg/m2 Recheck of his BP is 180/110 by me.  Patient is pleasant and in no acute distress. Skin is warm and dry. Color is normal.  HEENT is unremarkable. Normocephalic/atraumatic. PERRL. Sclera are nonicteric. Neck is supple. No masses. No JVD. Lungs are clear. Cardiac exam shows a regular rate  and rhythm. Abdomen is soft. Extremities are without edema. Gait and ROM are intact. No gross neurologic deficits noted.  LABORATORY DATA:  Lab Results  Component Value Date   WBC 7.0 11/03/2011   HGB 15.6 03/17/2013   HCT 46.0 03/17/2013   PLT 341 11/03/2011   GLUCOSE 95 03/17/2013   ALT 14 11/12/2008   AST 16 11/12/2008   NA 142 03/17/2013   K 3.6 03/17/2013   CL 102 03/17/2013   CREATININE 1.50* 03/17/2013   BUN 20 03/17/2013   CO2 29 08/16/2012   INR 0.9 11/11/2008   ANGIOGRAPHIC DATA FROM 2009:   1. The left main tapers distally with about 20% narrowing.  2. The proximal LAD clearly has some mild concentric plaquing. This  would measure about 40-50% in luminal reduction. The vessel opens  up and then is calcified in its midportion. Beyond the area of  calcification is a second area of  narrowing. The second area of  narrowing measures about 40-50% in luminal reduction. This was  shown to the patient in multiple views. The distal LAD wraps the  apex and was without critical narrowing.  3. The circumflex provides a proximal marginal and a second marginal  branch. There is a third marginal branch that is moderate in size.  The AV circumflex after this has probably 30 and 40% narrowing in a  segmental area of plaquing distally.  4. The right coronary artery is somewhat smaller caliber vessel. Just  after the takeoff of the SA nodal artery, there is about 50% area  of focal narrowing. There is 40% narrowing in the midvessel and 20-  30% narrowing more distal to this.  5. Ventriculography in the RAO projection reveals vigorous global  systolic function. No definite wall motion abnormalities are  identified.   CONCLUSIONS:  1. Preserved LV function.  2. Scattered coronary artery disease.  Assessment / Plan:  1. HTN - BP remains elevated. Compliance is an issue - I have tried to explain to him the importance of keeping his visits, avoiding salt, etc - in order to avoid long term effects. Will check a renal duplex. Stop the hydralazine since he is quite underdosed. Try back Clonidine 0.1 mg BID - he has had some sleepiness with this in the past but says he is willing to try again. Will have him come for a nurse visit for BP check next week and I will see him back in 2 weeks.   2. CAD - nonobstructive per cath in 2009 - no symptoms reported.   3. Tobacco abuse - cessation encouraged.   4. Noncompliance  5. ED - I told him I would refill his Viagra but only once his BP is controlled.   Patient is agreeable to this plan and will call if any problems develop in the interim.   Rosalio Macadamia, RN, ANP-C [facility] [address] [place], [place]  [address]

## 2013-05-08 ENCOUNTER — Encounter (INDEPENDENT_AMBULATORY_CARE_PROVIDER_SITE_OTHER): Payer: Self-pay

## 2013-05-08 DIAGNOSIS — I10 Essential (primary) hypertension: Secondary | ICD-10-CM

## 2013-05-11 ENCOUNTER — Encounter: Payer: Self-pay | Admitting: Nurse Practitioner

## 2013-05-11 ENCOUNTER — Other Ambulatory Visit: Payer: Self-pay

## 2013-05-11 ENCOUNTER — Ambulatory Visit (INDEPENDENT_AMBULATORY_CARE_PROVIDER_SITE_OTHER): Payer: No Typology Code available for payment source | Admitting: Nurse Practitioner

## 2013-05-11 ENCOUNTER — Ambulatory Visit
Admission: RE | Admit: 2013-05-11 | Discharge: 2013-05-11 | Disposition: A | Discharge status: Home / Self Care | Payer: No Typology Code available for payment source | Source: Ambulatory Visit | Attending: Nurse Practitioner | Admitting: Nurse Practitioner

## 2013-05-11 VITALS — BP 134/92 | HR 80 | Ht 67.0 in | Wt 197.0 lb

## 2013-05-11 DIAGNOSIS — I10 Essential (primary) hypertension: Secondary | ICD-10-CM

## 2013-05-11 DIAGNOSIS — R0989 Other specified symptoms and signs involving the circulatory and respiratory systems: Secondary | ICD-10-CM

## 2013-05-11 DIAGNOSIS — R0609 Other forms of dyspnea: Secondary | ICD-10-CM

## 2013-05-11 DIAGNOSIS — R0600 Dyspnea, unspecified: Secondary | ICD-10-CM

## 2013-05-11 LAB — BASIC METABOLIC PANEL
BUN: 18 mg/dL (ref 6–23)
CO2: 31 mEq/L (ref 19–32)
Calcium: 9.1 mg/dL (ref 8.4–10.5)
Chloride: 100 mEq/L (ref 96–112)
Creatinine, Ser: 1.5 mg/dL (ref 0.4–1.5)
GFR: 62.47 mL/min (ref >60.00)
Glucose, Bld: 91 mg/dL (ref 70–99)
Potassium: 3.1 mEq/L — ABNORMAL LOW (ref 3.5–5.1)
Sodium: 138 mEq/L (ref 135–145)

## 2013-05-11 LAB — BRAIN NATRIURETIC PEPTIDE: Pro B Natriuretic peptide (BNP): 164 pg/mL — ABNORMAL HIGH (ref 0.0–100.0)

## 2013-05-11 MED ORDER — POTASSIUM CHLORIDE CRYS ER 20 MEQ PO TBCR: EXTENDED_RELEASE_TABLET | ORAL | Status: DC

## 2013-05-11 MED ORDER — AMLODIPINE BESYLATE 10 MG PO TABS: 10.0000 mg | ORAL_TABLET | Freq: Every day | ORAL | Status: DC
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
## 2013-05-11 NOTE — Patient Instructions (Addendum)
Continue with your current medicines except increase the Norvasc (amlodipine) to two pills each day (10mg ) - the prescription for the 10 mg is at the drugstore.  Restrict your use of salt!!  Try to work on healthy habits - not smoke - regular exercise, etc.  Go to [facility] to [facility] for a chest x ray today  We are checking labs today  I want you to be seen next week  Call the [facility] office at [phone] if you have any questions, problems or concerns.

## 2013-05-14 ENCOUNTER — Encounter: Payer: Self-pay | Admitting: Cardiology

## 2013-05-14 ENCOUNTER — Ambulatory Visit (INDEPENDENT_AMBULATORY_CARE_PROVIDER_SITE_OTHER): Payer: Self-pay | Admitting: Cardiology

## 2013-05-14 ENCOUNTER — Other Ambulatory Visit (INDEPENDENT_AMBULATORY_CARE_PROVIDER_SITE_OTHER): Payer: Self-pay

## 2013-05-14 VITALS — BP 160/80 | HR 60 | Ht 67.0 in | Wt 193.8 lb

## 2013-05-14 DIAGNOSIS — I10 Essential (primary) hypertension: Secondary | ICD-10-CM

## 2013-05-14 LAB — BASIC METABOLIC PANEL
BUN: 27 mg/dL — ABNORMAL HIGH (ref 6–23)
CO2: 29 mEq/L (ref 19–32)
Calcium: 9.2 mg/dL (ref 8.4–10.5)
GFR: 51.52 mL/min — ABNORMAL LOW (ref >60.00)
Glucose, Bld: 121 mg/dL — ABNORMAL HIGH (ref 70–99)
Sodium: 136 mEq/L (ref 135–145)

## 2013-05-14 LAB — LIPID PANEL
HDL: 34.2 mg/dL — ABNORMAL LOW (ref >39.00)
VLDL: 39.6 mg/dL (ref 0.0–40.0)

## 2013-05-14 LAB — HEPATIC FUNCTION PANEL
ALT: 11 U/L (ref 0–53)
Alkaline Phosphatase: 56 U/L (ref 39–117)
Bilirubin, Direct: 0 mg/dL (ref 0.0–0.3)
Total Bilirubin: 1.1 mg/dL (ref 0.3–1.2)
Total Protein: 8.3 g/dL (ref 6.0–8.3)

## 2013-05-14 MED ORDER — HYDRALAZINE HCL 25 MG PO TABS: 25.0000 mg | ORAL_TABLET | Freq: Three times a day (TID) | ORAL | Status: DC

## 2013-05-14 MED ORDER — ASPIRIN EC 81 MG PO TBEC: 81.0000 mg | DELAYED_RELEASE_TABLET | Freq: Every day | ORAL | Status: DC

## 2013-05-14 MED ORDER — PRAVASTATIN SODIUM 40 MG PO TABS: 40.0000 mg | ORAL_TABLET | Freq: Every evening | ORAL | Status: DC

## 2013-05-14 NOTE — Assessment & Plan Note (Addendum)
Blood pressure remains elevated. Add hydralazine 25 mg by mouth 3 times a day. He did not want to increase his clonidine as it was causing him to be sleepy. Recent potassium was mildly decreased. Will repeat potassium and renal function today. He has some dyspnea with exertion. Recheck echocardiogram. I have also explained that he needs a primary care physician long term.

## 2013-05-14 NOTE — Progress Notes (Signed)
   HPI: 47 yo male previously followed by Dr Riley Kill for fu of hypertension. Echo in 2009 revealed normal LV function. Cardiac cath in 2009 revealed normal LV function, 40-50 LAD, 30-40 Lcx, 50 RCA. Renal dopplers in June of 2014 were normal. Patient apparently with compliance issues in past and does not wear CPAP. Since he was last seen, he notes some dyspnea on exertion but no orthopnea, PND or pedal edema. He has chest pain with more vigorous activities but not routine activities. This has been present since his previous catheterization.  Current Outpatient Prescriptions  Medication Sig Dispense Refill  . amLODipine (NORVASC) 10 MG tablet Take 1 tablet (10 mg total) by mouth daily.  30 tablet  3  . benazepril (LOTENSIN) 40 MG tablet Take 1 tablet (40 mg total) by mouth daily.  30 tablet  11  . cloNIDine (CATAPRES) 0.1 MG tablet Take 0.1 mg by mouth at bedtime.      . fish oil-omega-3 fatty acids 1000 MG capsule Take 1 g by mouth daily.        . metoprolol tartrate (LOPRESSOR) 25 MG tablet Take 1 tablet (25 mg total) by mouth 2 (two) times daily.  60 tablet  11  . nitroGLYCERIN (NITROSTAT) 0.4 MG SL tablet Place 1 tablet (0.4 mg total) under the tongue every 5 (five) minutes as needed. For chest pain  25 tablet  3  . potassium chloride SA (K-DUR,KLOR-CON) 20 MEQ tablet Take 2 tablets today then take 1 tablet daily thereafter.  31 tablet  3  . triamterene-hydrochlorothiazide (MAXZIDE-25) 37.5-25 MG per tablet Take 1 each (1 tablet total) by mouth daily.  30 tablet  11   No current facility-administered medications for this visit.     Past Medical History  Diagnosis Date  . CAD (coronary artery disease)     Non obstructive  . Obesity   . History of syncope 08/2008    Secondary to hypertension  . Tobacco abuse   . Hypertension, essential, benign   . Chest pain, unspecified   . Noncompliance     History reviewed. No pertinent past surgical history.  History   Social History  .  Marital Status: Divorced    Spouse Name: N/A    Number of Children: N/A  . Years of Education: N/A   Occupational History  . CNA    Social History Main Topics  . Smoking status: Former Smoker -- 0.30 packs/day for 32 years    Quit date: 05/11/2013  . Smokeless tobacco: Not on file     Comment: Started smoking at age 51 (69)  . Alcohol Use: No  . Drug Use: No  . Sexually Active: Not Currently   Other Topics Concern  . 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 Remaining systems are negative.  Physical Exam: Well-developed well-nourished in no acute distress.  Skin is warm and dry.  HEENT is normal.  Neck is supple.  Chest is clear to auscultation with normal expansion.  Cardiovascular exam is regular rate and rhythm.  Abdominal exam nontender or distended. No masses palpated. Extremities show no edema. neuro grossly intact

## 2013-05-14 NOTE — Assessment & Plan Note (Signed)
Patient has not smoked for one week. I encouraged him to continue off of tobacco.

## 2013-05-14 NOTE — Patient Instructions (Addendum)
Your physician recommends that you schedule a follow-up appointment in: 3 MONTHS WITH DR CRENSHAW  START HYDRALAZINE 25 MG ONE TABLET THREE TIMES DAILY  START ASPIRIN 81 MG ONCE DAILY WITH FOOD  START PRAVASTATIN 40 MG ONCE DAILY AT BEDTIME  Your physician recommends that you return for lab work in: 6 WEEKS-PRIOR TO EATING    Your physician has requested that you have an echocardiogram. Echocardiography is a painless test that uses sound waves to create images of your heart. It provides your doctor with information about the size and shape of your heart and how well your heart's chambers and valves are working. This procedure takes approximately one hour. There are no restrictions for this procedure.

## 2013-05-14 NOTE — Assessment & Plan Note (Signed)
Patient encouraged to followup with pulmonary as I think CPAP might help his blood pressure.

## 2013-05-14 NOTE — Assessment & Plan Note (Signed)
Add aspirin 81 mg daily. Add Pravachol 40 mg daily. Check lipids and liver in 6 weeks.

## 2013-05-15 ENCOUNTER — Other Ambulatory Visit: Payer: Self-pay | Admitting: *Deleted

## 2013-05-15 DIAGNOSIS — I2510 Atherosclerotic heart disease of native coronary artery without angina pectoris: Secondary | ICD-10-CM

## 2013-05-21 ENCOUNTER — Ambulatory Visit (HOSPITAL_COMMUNITY): Payer: Self-pay | Attending: Cardiology | Admitting: Radiology

## 2013-05-21 DIAGNOSIS — R0989 Other specified symptoms and signs involving the circulatory and respiratory systems: Secondary | ICD-10-CM | POA: Insufficient documentation

## 2013-05-21 DIAGNOSIS — I10 Essential (primary) hypertension: Secondary | ICD-10-CM | POA: Insufficient documentation

## 2013-05-21 DIAGNOSIS — R0602 Shortness of breath: Secondary | ICD-10-CM

## 2013-05-21 DIAGNOSIS — R0609 Other forms of dyspnea: Secondary | ICD-10-CM | POA: Insufficient documentation

## 2013-05-21 NOTE — Progress Notes (Signed)
Echocardiogram performed.  

## 2013-06-25 ENCOUNTER — Other Ambulatory Visit: Payer: Self-pay

## 2013-07-17 ENCOUNTER — Other Ambulatory Visit: Payer: Self-pay

## 2013-08-14 ENCOUNTER — Ambulatory Visit (INDEPENDENT_AMBULATORY_CARE_PROVIDER_SITE_OTHER): Payer: Self-pay | Admitting: Cardiology

## 2013-08-14 ENCOUNTER — Encounter: Payer: Self-pay | Admitting: Cardiology

## 2013-08-14 VITALS — BP 160/80 | HR 63 | Wt 199.0 lb

## 2013-08-14 DIAGNOSIS — F17200 Nicotine dependence, unspecified, uncomplicated: Secondary | ICD-10-CM

## 2013-08-14 DIAGNOSIS — I2510 Atherosclerotic heart disease of native coronary artery without angina pectoris: Secondary | ICD-10-CM

## 2013-08-14 DIAGNOSIS — E785 Hyperlipidemia, unspecified: Secondary | ICD-10-CM

## 2013-08-14 DIAGNOSIS — I10 Essential (primary) hypertension: Secondary | ICD-10-CM

## 2013-08-14 LAB — BASIC METABOLIC PANEL
BUN: 24 mg/dL — ABNORMAL HIGH (ref 6–23)
CO2: 27 mEq/L (ref 19–32)
GFR: 61.94 mL/min (ref >60.00)
Glucose, Bld: 88 mg/dL (ref 70–99)
Potassium: 3.6 mEq/L (ref 3.5–5.1)
Sodium: 137 mEq/L (ref 135–145)

## 2013-08-14 LAB — LIPID PANEL
HDL: 33.5 mg/dL — ABNORMAL LOW (ref >39.00)
VLDL: 23.4 mg/dL (ref 0.0–40.0)

## 2013-08-14 LAB — HEPATIC FUNCTION PANEL
Alkaline Phosphatase: 50 U/L (ref 39–117)
Bilirubin, Direct: 0 mg/dL (ref 0.0–0.3)
Total Bilirubin: 0.8 mg/dL (ref 0.3–1.2)

## 2013-08-14 MED ORDER — LABETALOL HCL 100 MG PO TABS: 100.0000 mg | ORAL_TABLET | Freq: Two times a day (BID) | ORAL | Status: DC

## 2013-08-14 NOTE — Progress Notes (Signed)
HPI: Fu of hypertension. Cardiac cath in 2009 revealed normal LV function, 40-50 LAD, 30-40 Lcx, 50 RCA. Renal dopplers in June of 2014 were normal. Echo 6/14 45-50, grade 1 diastolic dysfunction. Patient apparently with compliance issues in past and does not wear CPAP. Since I last saw him in June of 2014, he denies dyspnea, chest pain, palpitations or syncope.   Current Outpatient Prescriptions  Medication Sig Dispense Refill  . amLODipine (NORVASC) 10 MG tablet Take 1 tablet (10 mg total) by mouth daily.  30 tablet  3  . aspirin EC 81 MG tablet Take 1 tablet (81 mg total) by mouth daily.  90 tablet  3  . benazepril (LOTENSIN) 40 MG tablet Take 1 tablet (40 mg total) by mouth daily.  30 tablet  11  . cloNIDine (CATAPRES) 0.1 MG tablet Take 0.1 mg by mouth at bedtime.      . fish oil-omega-3 fatty acids 1000 MG capsule Take 1 g by mouth daily.        . hydrALAZINE (APRESOLINE) 25 MG tablet Take 25 mg by mouth daily.      . nitroGLYCERIN (NITROSTAT) 0.4 MG SL tablet Place 1 tablet (0.4 mg total) under the tongue every 5 (five) minutes as needed. For chest pain  25 tablet  3  . potassium chloride SA (K-DUR,KLOR-CON) 20 MEQ tablet Take 20 mEq by mouth daily.      . pravastatin (PRAVACHOL) 40 MG tablet Take 1 tablet (40 mg total) by mouth every evening.  30 tablet  12  . triamterene-hydrochlorothiazide (MAXZIDE-25) 37.5-25 MG per tablet Take 1 each (1 tablet total) by mouth daily.  30 tablet  11  . metoprolol tartrate (LOPRESSOR) 25 MG tablet Take 1 tablet (25 mg total) by mouth 2 (two) times daily.  60 tablet  11   No current facility-administered medications for this visit.     Past Medical History  Diagnosis Date  . CAD (coronary artery disease)     Non obstructive  . Obesity   . History of syncope 08/2008    Secondary to hypertension  . Tobacco abuse   . Hypertension, essential, benign   . Chest pain, unspecified   . Noncompliance     History reviewed. No pertinent past  surgical history.  History   Social History  . Marital Status: Divorced    Spouse Name: N/A    Number of Children: N/A  . Years of Education: N/A   Occupational History  . CNA    Social History Main Topics  . Smoking status: Former Smoker -- 0.30 packs/day for 32 years    Quit date: 05/11/2013  . Smokeless tobacco: Not on file     Comment: Started smoking at age 27 (21)  . Alcohol Use: No  . Drug Use: No  . Sexual Activity: Not Currently   Other Topics Concern  . 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 Remaining systems are negative.  Physical Exam: Well-developed well-nourished in no acute distress.  Skin is warm and dry.  HEENT is normal.  Neck is supple.  Chest is clear to auscultation with normal expansion.  Cardiovascular exam is regular rate and rhythm.  Abdominal exam nontender or  distended. No masses palpated. Extremities show no edema. neuro grossly intact  ECG sinus rhythm at a rate of 63. Left ventricular hypertrophy with repolarization abnormality.

## 2013-08-14 NOTE — Assessment & Plan Note (Addendum)
Continue aspirin and statin. Check lipids and liver. 

## 2013-08-14 NOTE — Assessment & Plan Note (Signed)
Patient counseled on discontinuing. 

## 2013-08-14 NOTE — Patient Instructions (Addendum)
Your physician recommends that you return for lab work in: today  Your physician has recommended you make the following change in your medication: stop taking Metoprolol and Hydralazine and start taking Labetalol 100 mg twice daily  Your physician recommends that you schedule a follow-up appointment in: 8 weeks

## 2013-08-14 NOTE — Assessment & Plan Note (Addendum)
Blood pressure remains mildly elevated. He is not taking hydralazine but once a day. He states it causes a headache. Discontinue hydralazine. Discontinue metoprolol. Begin labetalol 100 mg by mouth twice a day. Increase as needed for blood pressure control. I had extensive discussions with him today concerning end organ effects of uncontrolled blood pressure. Check potassium and renal function. I have instructed him to purchase a blood pressure cuff and follow his blood pressure at home.

## 2013-08-14 NOTE — Addendum Note (Signed)
Addended by: Demetrios Loll on: 08/14/2013 11:33 AM   Modules accepted: Orders

## 2013-08-16 ENCOUNTER — Other Ambulatory Visit: Payer: Self-pay | Admitting: *Deleted

## 2013-08-16 DIAGNOSIS — E7800 Pure hypercholesterolemia, unspecified: Secondary | ICD-10-CM

## 2013-08-16 MED ORDER — ATORVASTATIN CALCIUM 40 MG PO TABS: 40.0000 mg | ORAL_TABLET | Freq: Every day | ORAL | Status: DC

## 2013-08-28 ENCOUNTER — Other Ambulatory Visit: Payer: Self-pay | Admitting: Cardiology

## 2013-08-28 ENCOUNTER — Other Ambulatory Visit: Payer: Self-pay | Admitting: *Deleted

## 2013-08-28 MED ORDER — AMLODIPINE BESYLATE 10 MG PO TABS: 10.0000 mg | ORAL_TABLET | Freq: Every day | ORAL | Status: DC

## 2013-08-31 ENCOUNTER — Other Ambulatory Visit: Payer: Self-pay

## 2013-08-31 DIAGNOSIS — I10 Essential (primary) hypertension: Secondary | ICD-10-CM

## 2013-08-31 DIAGNOSIS — E7800 Pure hypercholesterolemia, unspecified: Secondary | ICD-10-CM

## 2013-08-31 MED ORDER — AMLODIPINE BESYLATE 10 MG PO TABS: 10.0000 mg | ORAL_TABLET | Freq: Every day | ORAL | Status: DC

## 2013-08-31 MED ORDER — ATORVASTATIN CALCIUM 40 MG PO TABS: 40.0000 mg | ORAL_TABLET | Freq: Every day | ORAL | Status: DC

## 2013-08-31 MED ORDER — LABETALOL HCL 100 MG PO TABS: 100.0000 mg | ORAL_TABLET | Freq: Two times a day (BID) | ORAL | Status: DC

## 2013-08-31 MED ORDER — TRIAMTERENE-HCTZ 37.5-25 MG PO TABS: 1.0000 | ORAL_TABLET | Freq: Every day | ORAL | Status: DC

## 2013-08-31 MED ORDER — BENAZEPRIL HCL 40 MG PO TABS: ORAL_TABLET | ORAL | Status: DC

## 2013-10-25 ENCOUNTER — Encounter: Payer: Self-pay | Admitting: Physician Assistant

## 2013-10-25 ENCOUNTER — Telehealth: Payer: Self-pay | Admitting: Cardiology

## 2013-10-25 NOTE — Telephone Encounter (Signed)
Follow Up ° °Pt returned call//  °

## 2013-10-25 NOTE — Telephone Encounter (Signed)
New Problem     Patient has been having SOB doing anthing for the pass two week and called for an appt 1/19 is the first available.  Pt would like something sooner and would like to know what he should don the mean time?

## 2013-10-25 NOTE — Telephone Encounter (Signed)
Left message for pt to call.

## 2013-10-25 NOTE — Telephone Encounter (Signed)
Spoke with [name], he is having trouble with SOB with exertion. He does not have any edema. No SOB with rest but reports SOB when lying flat. He also reports getting tired easier than before. He states he is taking all his meds. [name] will come to the office tomorrow at 10:30 to see scott weaver pac.

## 2013-10-26 ENCOUNTER — Telehealth: Payer: Self-pay | Admitting: Cardiology

## 2013-10-26 ENCOUNTER — Ambulatory Visit: Payer: Self-pay | Admitting: Physician Assistant

## 2013-10-26 NOTE — Telephone Encounter (Signed)
New Message  Pt called had to resch appointment because he could not find anyone to cover for him at work.[name] appointment to 12/15 with Tereso Newcomer.. Pt was initially experiencing SOB// please call to resch if pt is in need of a sooner appt

## 2013-10-29 ENCOUNTER — Encounter: Payer: Self-pay | Admitting: Cardiology

## 2013-10-29 ENCOUNTER — Ambulatory Visit (INDEPENDENT_AMBULATORY_CARE_PROVIDER_SITE_OTHER): Payer: Self-pay | Admitting: Cardiology

## 2013-10-29 VITALS — BP 130/86 | HR 76 | Ht 67.0 in | Wt 197.8 lb

## 2013-10-29 DIAGNOSIS — R0602 Shortness of breath: Secondary | ICD-10-CM

## 2013-10-29 DIAGNOSIS — I2510 Atherosclerotic heart disease of native coronary artery without angina pectoris: Secondary | ICD-10-CM

## 2013-10-29 DIAGNOSIS — N529 Male erectile dysfunction, unspecified: Secondary | ICD-10-CM | POA: Insufficient documentation

## 2013-10-29 LAB — BASIC METABOLIC PANEL
BUN: 23 mg/dL (ref 6–23)
CO2: 29 mEq/L (ref 19–32)
Calcium: 9.3 mg/dL (ref 8.4–10.5)
Chloride: 100 mEq/L (ref 96–112)
Creatinine, Ser: 1.8 mg/dL — ABNORMAL HIGH (ref 0.4–1.5)
Glucose, Bld: 93 mg/dL (ref 70–99)

## 2013-10-29 MED ORDER — LABETALOL HCL 200 MG PO TABS: 200.0000 mg | ORAL_TABLET | Freq: Two times a day (BID) | ORAL | Status: DC

## 2013-10-29 MED ORDER — TADALAFIL 20 MG PO TABS: 20.0000 mg | ORAL_TABLET | Freq: Every day | ORAL | Status: DC | PRN

## 2013-10-29 NOTE — Assessment & Plan Note (Signed)
Patient counseled on discontinuing. 

## 2013-10-29 NOTE — Progress Notes (Signed)
HPI: Fu of hypertension. Cardiac cath in 2009 revealed normal LV function, 40-50 LAD, 30-40 Lcx, 50 RCA. Renal dopplers in June of 2014 were normal. Echo 6/14 45-50, grade 1 diastolic dysfunction. Patient apparently with compliance issues in past and does not wear CPAP. When I last saw him in September of 2014 we discontinued hydralazine and metoprolol and started labetalol. Since that time, he has some dyspnea on exertion. No orthopnea, PND or pedal edema. No chest pain or syncope.   Current Outpatient Prescriptions  Medication Sig Dispense Refill  . amLODipine (NORVASC) 10 MG tablet Take 1 tablet (10 mg total) by mouth daily.  30 tablet  11  . aspirin EC 81 MG tablet Take 1 tablet (81 mg total) by mouth daily.  90 tablet  3  . atorvastatin (LIPITOR) 40 MG tablet Take 1 tablet (40 mg total) by mouth daily.  30 tablet  11  . benazepril (LOTENSIN) 40 MG tablet TAKE ONE TABLET BY MOUTH EVERY DAY  30 tablet  11  . cloNIDine (CATAPRES) 0.1 MG tablet Take 0.1 mg by mouth at bedtime.      . fish oil-omega-3 fatty acids 1000 MG capsule Take 1 g by mouth daily.        Marland Kitchen labetalol (NORMODYNE) 100 MG tablet Take 1 tablet (100 mg total) by mouth 2 (two) times daily.  60 tablet  12  . nitroGLYCERIN (NITROSTAT) 0.4 MG SL tablet Place 1 tablet (0.4 mg total) under the tongue every 5 (five) minutes as needed. For chest pain  25 tablet  3  . potassium chloride SA (K-DUR,KLOR-CON) 20 MEQ tablet Take 20 mEq by mouth daily.      Marland Kitchen triamterene-hydrochlorothiazide (MAXZIDE-25) 37.5-25 MG per tablet Take 1 tablet by mouth daily.  30 tablet  11  . metoprolol tartrate (LOPRESSOR) 25 MG tablet Take 1 tablet (25 mg total) by mouth 2 (two) times daily.  60 tablet  11   No current facility-administered medications for this visit.     Past Medical History  Diagnosis Date  . CAD (coronary artery disease)     Non obstructive  . Obesity   . History of syncope 08/2008    Secondary to hypertension  . Tobacco  abuse   . Hypertension, essential, benign     a. renal art [place] (6/14): no RAS  . Chest pain, unspecified   . Noncompliance   . CKD (chronic kidney disease)     History reviewed. No pertinent past surgical history.  History   Social History  . Marital Status: Divorced    Spouse Name: N/A    Number of Children: N/A  . Years of Education: N/A   Occupational History  . CNA    Social History Main Topics  . Smoking status: Former Smoker -- 0.30 packs/day for 32 years    Quit date: 05/11/2013  . Smokeless tobacco: Not on file     Comment: Started smoking at age 78 (78)  . Alcohol Use: No  . Drug Use: No  . Sexual Activity: Not Currently   Other Topics Concern  . Not on file   Social History Narrative   Lives in [place], but has a Insurance claims handler address   Lives with uncle   Separated from his wife    ROS: no fevers or chills, productive cough, hemoptysis, dysphasia, odynophagia, melena, hematochezia, dysuria, hematuria, rash, seizure activity, orthopnea, PND, pedal edema, claudication. Remaining systems are negative.  Physical Exam: Well-developed well-nourished in no  acute distress.  Skin is warm and dry.  HEENT is normal.  Neck is supple.  Chest is clear to auscultation with normal expansion.  Cardiovascular exam is regular rate and rhythm.  Abdominal exam nontender or distended. No masses palpated. Extremities show no edema. neuro grossly intact  Electrocardiogram shows sinus rhythm, left ventricular hypertrophy, occasional PACs, nonspecific ST changes.

## 2013-10-29 NOTE — Assessment & Plan Note (Signed)
Blood pressure has improved. I will continue present medications but discontinue clonidine and increase labetalol to 200 mg by mouth twice a day. I have asked him to purchase a blood pressure cuff and keep track of his blood pressure at home. We will adjust based on results. He is describing some dyspnea on exertion. Check potassium, renal function and BNP.

## 2013-10-29 NOTE — Assessment & Plan Note (Signed)
Plan cialis 10 mg PRN. Patient instructed about risk associated with nitroglycerin.

## 2013-10-29 NOTE — Telephone Encounter (Signed)
Pt seen today

## 2013-10-29 NOTE — Patient Instructions (Signed)
Your physician recommends that you schedule a follow-up appointment in: 3 MONTHS WITH DR CRENSHAW  STOP CLONIDINE   INCREASE LABETALOL TO 200 MG TWICE DAILY  Your physician recommends that you HAVE LAB WORK TODAY  CILIAS 20 MG AS NEEDED

## 2013-10-29 NOTE — Assessment & Plan Note (Signed)
Continue aspirin and statin. 

## 2013-10-30 ENCOUNTER — Other Ambulatory Visit: Payer: Self-pay | Admitting: *Deleted

## 2013-10-30 DIAGNOSIS — E876 Hypokalemia: Secondary | ICD-10-CM

## 2013-10-30 MED ORDER — POTASSIUM CHLORIDE CRYS ER 20 MEQ PO TBCR: 20.0000 meq | EXTENDED_RELEASE_TABLET | Freq: Two times a day (BID) | ORAL | Status: DC

## 2013-11-02 ENCOUNTER — Ambulatory Visit: Payer: Self-pay | Admitting: Physician Assistant

## 2013-11-05 ENCOUNTER — Ambulatory Visit: Payer: Self-pay | Admitting: Physician Assistant

## 2013-11-06 ENCOUNTER — Other Ambulatory Visit: Payer: Self-pay

## 2013-11-23 ENCOUNTER — Emergency Department (HOSPITAL_COMMUNITY): Payer: No Typology Code available for payment source

## 2013-11-23 ENCOUNTER — Encounter (HOSPITAL_COMMUNITY): Payer: Self-pay | Admitting: Emergency Medicine

## 2013-11-23 ENCOUNTER — Emergency Department (HOSPITAL_COMMUNITY)
Admission: EM | Admit: 2013-11-23 | Discharge: 2013-11-23 | Disposition: A | Discharge status: Home / Self Care | Payer: No Typology Code available for payment source | Attending: Emergency Medicine | Admitting: Emergency Medicine

## 2013-11-23 DIAGNOSIS — Z91199 Patient's noncompliance with other medical treatment and regimen due to unspecified reason: Secondary | ICD-10-CM | POA: Insufficient documentation

## 2013-11-23 DIAGNOSIS — Z79899 Other long term (current) drug therapy: Secondary | ICD-10-CM | POA: Insufficient documentation

## 2013-11-23 DIAGNOSIS — Y9289 Other specified places as the place of occurrence of the external cause: Secondary | ICD-10-CM | POA: Insufficient documentation

## 2013-11-23 DIAGNOSIS — S0993XA Unspecified injury of face, initial encounter: Secondary | ICD-10-CM | POA: Insufficient documentation

## 2013-11-23 DIAGNOSIS — I2510 Atherosclerotic heart disease of native coronary artery without angina pectoris: Secondary | ICD-10-CM | POA: Insufficient documentation

## 2013-11-23 DIAGNOSIS — Z7982 Long term (current) use of aspirin: Secondary | ICD-10-CM | POA: Insufficient documentation

## 2013-11-23 DIAGNOSIS — I129 Hypertensive chronic kidney disease with stage 1 through stage 4 chronic kidney disease, or unspecified chronic kidney disease: Secondary | ICD-10-CM | POA: Insufficient documentation

## 2013-11-23 DIAGNOSIS — S46909A Unspecified injury of unspecified muscle, fascia and tendon at shoulder and upper arm level, unspecified arm, initial encounter: Secondary | ICD-10-CM | POA: Insufficient documentation

## 2013-11-23 DIAGNOSIS — Y9389 Activity, other specified: Secondary | ICD-10-CM | POA: Insufficient documentation

## 2013-11-23 DIAGNOSIS — S20212A Contusion of left front wall of thorax, initial encounter: Secondary | ICD-10-CM

## 2013-11-23 DIAGNOSIS — Z880 Allergy status to penicillin: Secondary | ICD-10-CM | POA: Insufficient documentation

## 2013-11-23 DIAGNOSIS — S4980XA Other specified injuries of shoulder and upper arm, unspecified arm, initial encounter: Secondary | ICD-10-CM | POA: Insufficient documentation

## 2013-11-23 DIAGNOSIS — Z9119 Patient's noncompliance with other medical treatment and regimen: Secondary | ICD-10-CM | POA: Insufficient documentation

## 2013-11-23 DIAGNOSIS — Y990 Civilian activity done for income or pay: Secondary | ICD-10-CM | POA: Insufficient documentation

## 2013-11-23 DIAGNOSIS — S199XXA Unspecified injury of neck, initial encounter: Secondary | ICD-10-CM

## 2013-11-23 DIAGNOSIS — N189 Chronic kidney disease, unspecified: Secondary | ICD-10-CM | POA: Insufficient documentation

## 2013-11-23 DIAGNOSIS — S20219A Contusion of unspecified front wall of thorax, initial encounter: Secondary | ICD-10-CM | POA: Insufficient documentation

## 2013-11-23 DIAGNOSIS — X58XXXA Exposure to other specified factors, initial encounter: Secondary | ICD-10-CM | POA: Insufficient documentation

## 2013-11-23 DIAGNOSIS — Z87891 Personal history of nicotine dependence: Secondary | ICD-10-CM | POA: Insufficient documentation

## 2013-11-23 DIAGNOSIS — E669 Obesity, unspecified: Secondary | ICD-10-CM | POA: Insufficient documentation

## 2013-11-23 MED ORDER — HYDROCODONE-ACETAMINOPHEN 5-325 MG PO TABS: 1.0000 | ORAL_TABLET | Freq: Four times a day (QID) | ORAL | Status: DC | PRN
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
## 2013-11-23 NOTE — ED Notes (Signed)
Incentive spirometry given to and teaching provided. Pt and fmaily verbalize understanding.
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
## 2013-11-23 NOTE — Discharge Instructions (Signed)
Do not drive, operate heavy machinery, drink alcohol, or take other tylenol containing products with this medicine.  Chest Contusion A chest contusion is a deep bruise on your chest area. Contusions are the result of an injury that caused bleeding under the skin. A chest contusion may involve bruising of the skin, muscles, or ribs. The contusion may turn blue, purple, or yellow. Minor injuries will give you a painless contusion, but more severe contusions may stay painful and swollen for a few weeks. CAUSES  A contusion is usually caused by a blow, trauma, or direct force to an area of the body. SYMPTOMS   Swelling and redness of the injured area.  Discoloration of the injured area.  Tenderness and soreness of the injured area.  Pain. DIAGNOSIS  The diagnosis can be made by taking a history and performing a physical exam. An X-ray, CT scan, or MRI may be needed to determine if there were any associated injuries, such as broken bones (fractures) or internal injuries. TREATMENT  Often, the best treatment for a chest contusion is resting, icing, and applying cold compresses to the injured area. Deep breathing exercises may be recommended to reduce the risk of pneumonia. Over-the-counter medicines may also be recommended for pain control. HOME CARE INSTRUCTIONS   Put ice on the injured area.  Put ice in a plastic bag.  Place a towel between your skin and the bag.  Leave the ice on for 15-20 minutes, 03-04 times a day.  Only take over-the-counter or prescription medicines as directed by your caregiver. Your caregiver may recommend avoiding anti-inflammatory medicines (aspirin, ibuprofen, and naproxen) for 48 hours because these medicines may increase bruising.  Rest the injured area.  Perform deep-breathing exercises as directed by your caregiver.  Stop smoking if you smoke.  Do not lift objects over 5 pounds (2.3 kg) for 3 days or longer if recommended by your caregiver. SEEK  IMMEDIATE MEDICAL CARE IF:   You have increased bruising or swelling.  You have pain that is getting worse.  You have difficulty breathing.  You have dizziness, weakness, or fainting.  You have blood in your urine or stool.  You cough up or vomit blood.  Your swelling or pain is not relieved with medicines. MAKE SURE YOU:   Understand these instructions.  Will watch your condition.  Will get help right away if you are not doing well or get worse. Document Released: 08/03/2001 Document Revised: 08/02/2012 Document Reviewed: 05/01/2012 [facility] Patient Information 2014 [place], [place].

## 2013-11-26 NOTE — ED Provider Notes (Signed)
Medical screening examination/treatment/procedure(s) were performed by non-physician practitioner and as supervising physician I was immediately available for consultation/collaboration.  EKG Interpretation   None         Shelda Jakes, MD 11/26/13 1929

## 2013-12-10 ENCOUNTER — Ambulatory Visit: Payer: No Typology Code available for payment source | Admitting: Cardiology

## 2014-01-24 ENCOUNTER — Inpatient Hospital Stay (HOSPITAL_COMMUNITY)
Admission: EM | Admit: 2014-01-24 | Discharge: 2014-01-25 | DRG: 918 | Disposition: A | Discharge status: Home / Self Care | Payer: Self-pay | Attending: Internal Medicine | Admitting: Internal Medicine

## 2014-01-24 ENCOUNTER — Encounter (HOSPITAL_COMMUNITY): Payer: Self-pay | Admitting: Emergency Medicine

## 2014-01-24 ENCOUNTER — Emergency Department (HOSPITAL_COMMUNITY): Payer: No Typology Code available for payment source

## 2014-01-24 DIAGNOSIS — I5022 Chronic systolic (congestive) heart failure: Secondary | ICD-10-CM | POA: Diagnosis present

## 2014-01-24 DIAGNOSIS — Z9119 Patient's noncompliance with other medical treatment and regimen: Secondary | ICD-10-CM

## 2014-01-24 DIAGNOSIS — N183 Chronic kidney disease, stage 3 unspecified: Secondary | ICD-10-CM | POA: Diagnosis present

## 2014-01-24 DIAGNOSIS — I129 Hypertensive chronic kidney disease with stage 1 through stage 4 chronic kidney disease, or unspecified chronic kidney disease: Secondary | ICD-10-CM | POA: Diagnosis present

## 2014-01-24 DIAGNOSIS — E876 Hypokalemia: Secondary | ICD-10-CM | POA: Diagnosis present

## 2014-01-24 DIAGNOSIS — G4733 Obstructive sleep apnea (adult) (pediatric): Secondary | ICD-10-CM | POA: Diagnosis present

## 2014-01-24 DIAGNOSIS — Z91199 Patient's noncompliance with other medical treatment and regimen due to unspecified reason: Secondary | ICD-10-CM

## 2014-01-24 DIAGNOSIS — Z8249 Family history of ischemic heart disease and other diseases of the circulatory system: Secondary | ICD-10-CM

## 2014-01-24 DIAGNOSIS — E785 Hyperlipidemia, unspecified: Secondary | ICD-10-CM | POA: Diagnosis present

## 2014-01-24 DIAGNOSIS — E669 Obesity, unspecified: Secondary | ICD-10-CM | POA: Diagnosis present

## 2014-01-24 DIAGNOSIS — I2510 Atherosclerotic heart disease of native coronary artery without angina pectoris: Secondary | ICD-10-CM | POA: Diagnosis present

## 2014-01-24 DIAGNOSIS — Z7982 Long term (current) use of aspirin: Secondary | ICD-10-CM

## 2014-01-24 DIAGNOSIS — I161 Hypertensive emergency: Secondary | ICD-10-CM

## 2014-01-24 DIAGNOSIS — T405X1A Poisoning by cocaine, accidental (unintentional), initial encounter: Principal | ICD-10-CM | POA: Diagnosis present

## 2014-01-24 DIAGNOSIS — I10 Essential (primary) hypertension: Secondary | ICD-10-CM

## 2014-01-24 DIAGNOSIS — Z79899 Other long term (current) drug therapy: Secondary | ICD-10-CM

## 2014-01-24 DIAGNOSIS — F17200 Nicotine dependence, unspecified, uncomplicated: Secondary | ICD-10-CM | POA: Diagnosis present

## 2014-01-24 DIAGNOSIS — I509 Heart failure, unspecified: Secondary | ICD-10-CM

## 2014-01-24 DIAGNOSIS — N179 Acute kidney failure, unspecified: Secondary | ICD-10-CM

## 2014-01-24 DIAGNOSIS — R040 Epistaxis: Secondary | ICD-10-CM | POA: Diagnosis present

## 2014-01-24 LAB — BASIC METABOLIC PANEL
BUN: 23 mg/dL (ref 6–23)
CHLORIDE: 96 meq/L (ref 96–112)
CO2: 27 mEq/L (ref 19–32)
CREATININE: 1.95 mg/dL — AB (ref 0.50–1.35)
Calcium: 9.3 mg/dL (ref 8.4–10.5)
GFR calc non Af Amer: 39 mL/min — ABNORMAL LOW (ref >90)
GFR, EST AFRICAN AMERICAN: 45 mL/min — AB (ref >90)
Glucose, Bld: 93 mg/dL (ref 70–99)
Potassium: 3.1 mEq/L — ABNORMAL LOW (ref 3.7–5.3)
Sodium: 139 mEq/L (ref 137–147)

## 2014-01-24 LAB — CBC
HEMATOCRIT: 42.7 % (ref 39.0–52.0)
Hemoglobin: 15.4 g/dL (ref 13.0–17.0)
MCH: 31.1 pg (ref 26.0–34.0)
MCHC: 36.1 g/dL — ABNORMAL HIGH (ref 30.0–36.0)
MCV: 86.3 fL (ref 78.0–100.0)
Platelets: 313 10*3/uL (ref 150–400)
RBC: 4.95 MIL/uL (ref 4.22–5.81)
RDW: 13.7 % (ref 11.5–15.5)
WBC: 7.1 10*3/uL (ref 4.0–10.5)

## 2014-01-24 LAB — MAGNESIUM: Magnesium: 2 mg/dL (ref 1.5–2.5)

## 2014-01-24 LAB — PRO B NATRIURETIC PEPTIDE: Pro B Natriuretic peptide (BNP): 2207 pg/mL — ABNORMAL HIGH (ref 0–125)

## 2014-01-24 LAB — I-STAT TROPONIN, ED: Troponin i, poc: 0.07 ng/mL (ref 0.00–0.08)

## 2014-01-24 LAB — MRSA PCR SCREENING: MRSA BY PCR: NEGATIVE

## 2014-01-24 LAB — TROPONIN I

## 2014-01-24 MED ORDER — MORPHINE SULFATE 2 MG/ML IJ SOLN
1.0000 mg | Freq: Once | INTRAMUSCULAR | Status: AC
  Administered 2014-01-24: 1 mg via INTRAVENOUS
  Filled 2014-01-24: qty 1

## 2014-01-24 MED ORDER — NITROGLYCERIN 2 % TD OINT
1.0000 [in_us] | TOPICAL_OINTMENT | Freq: Once | TRANSDERMAL | Status: AC
  Administered 2014-01-24: 1 [in_us] via TOPICAL
  Filled 2014-01-24: qty 1

## 2014-01-24 MED ORDER — ACETAMINOPHEN 325 MG PO TABS
650.0000 mg | ORAL_TABLET | Freq: Four times a day (QID) | ORAL | Status: DC | PRN
  Administered 2014-01-24 – 2014-01-25 (×3): 650 mg via ORAL
  Filled 2014-01-24 (×3): qty 2

## 2014-01-24 MED ORDER — POTASSIUM CHLORIDE CRYS ER 20 MEQ PO TBCR
20.0000 meq | EXTENDED_RELEASE_TABLET | Freq: Once | ORAL | Status: AC
  Administered 2014-01-24: 20 meq via ORAL
  Filled 2014-01-24: qty 1

## 2014-01-24 MED ORDER — SODIUM CHLORIDE 0.9 % IJ SOLN
3.0000 mL | Freq: Two times a day (BID) | INTRAMUSCULAR | Status: DC
Start: 2014-01-24 — End: 2014-01-25
  Administered 2014-01-24 – 2014-01-25 (×2): 3 mL via INTRAVENOUS

## 2014-01-24 MED ORDER — ASPIRIN EC 81 MG PO TBEC
81.0000 mg | DELAYED_RELEASE_TABLET | Freq: Every day | ORAL | Status: DC
  Administered 2014-01-24 – 2014-01-25 (×2): 81 mg via ORAL
  Filled 2014-01-24 (×3): qty 1

## 2014-01-24 MED ORDER — HEPARIN SODIUM (PORCINE) 5000 UNIT/ML IJ SOLN
5000.0000 [IU] | Freq: Three times a day (TID) | INTRAMUSCULAR | Status: DC
  Administered 2014-01-24 – 2014-01-25 (×3): 5000 [IU] via SUBCUTANEOUS
  Filled 2014-01-24 (×5): qty 1

## 2014-01-24 MED ORDER — AMLODIPINE BESYLATE 10 MG PO TABS
10.0000 mg | ORAL_TABLET | Freq: Every day | ORAL | Status: DC
  Administered 2014-01-24: 10 mg via ORAL
  Filled 2014-01-24 (×2): qty 1

## 2014-01-24 MED ORDER — ACETAMINOPHEN 650 MG RE SUPP: 650.0000 mg | Freq: Four times a day (QID) | RECTAL | Status: DC | PRN

## 2014-01-24 MED ORDER — PROMETHAZINE HCL 25 MG PO TABS
12.5000 mg | ORAL_TABLET | Freq: Four times a day (QID) | ORAL | Status: DC | PRN
  Administered 2014-01-24: 12.5 mg via ORAL
  Filled 2014-01-24: qty 1

## 2014-01-24 MED ORDER — NITROGLYCERIN IN D5W 200-5 MCG/ML-% IV SOLN
10.0000 ug/min | INTRAVENOUS | Status: DC
  Administered 2014-01-24: 16.667 ug/min via INTRAVENOUS
  Administered 2014-01-25: 50 ug/min via INTRAVENOUS
  Filled 2014-01-24 (×2): qty 250

## 2014-01-24 MED ORDER — LABETALOL HCL 200 MG PO TABS: 200.0000 mg | ORAL_TABLET | Freq: Two times a day (BID) | ORAL | Status: DC

## 2014-01-24 MED ORDER — FUROSEMIDE 10 MG/ML IJ SOLN
40.0000 mg | Freq: Once | INTRAMUSCULAR | Status: AC
  Administered 2014-01-24: 40 mg via INTRAVENOUS
  Filled 2014-01-24: qty 4

## 2014-01-24 MED ORDER — TRIAMTERENE-HCTZ 37.5-25 MG PO TABS
1.0000 | ORAL_TABLET | Freq: Every day | ORAL | Status: DC
  Administered 2014-01-24: 1 via ORAL
  Filled 2014-01-24 (×2): qty 1

## 2014-01-24 NOTE — H&P (Signed)
Date: 01/24/2014               Patient Name:  [name] MRN: [id number]  DOB: 1965/12/22 Age / Sex: 48 y.o., male   PCP: No Pcp Per Patient         Medical Service: Internal Medicine Teaching Service         Attending Physician: Dr. Jonah BlueAlejandro Paya, DO    First Contact: Dr. Vivi BarrackSarah Nimrod Wendt Pager: [phone]  Second Contact: Dr. Leonia ReevesSoli Kennerly Pager: [phone]       After Hours (After 5p/  First Contact Pager: [phone]  weekends / holidays): Second Contact Pager: [phone]   Chief Complaint: Epistaxis and shortness of breath  History of Present Illness:  Eddie Bentyrone C Jasmin is a 48 y.o. male with PMH HTN, non-obstructive CAD (cath in 2009 revealed normal LV function, 40-50% LAD, 30-40% Lcx, 50% RCA), CHF (Echo 6/14 EF 45-50%, grade 1 diastolic dysfunction), obesity, CKD stage 3 who presents to emergency department with a chief complaint of epistaxis and shortness of breath, noted to have hypertension.  Patient reports he awoke from sleep at 2am this morning with a nosebleed. It was "dripping blood" all day, so he decided to come to the ED. He cannot quantify the amount. He denies using any nasal sprays or antihistamines/decongestants. He has not had a nosebleed since he was a kid. Upon arrival at the ED, the bleeding stopped on its on. However, he was noted to have significant hypertension to 203/132.  On further questioning, he also endorses occasional shortness of breath with walking, ongoing for several months. He thinks it has gradually worsened over the past month. He also notes occasional sharp, sternal, non-radiating chest pain with exertion, but he denies chest pain currently. He has blurry vision sometimes. Otherwise, he denies orthopnea, leg swelling, weight gain. He denies fevers, chills, abdominal pain, nausea, vomiting, diarrhea, syncope, dizziness, weakness. He reports good compliance with his medications. He denies recent dietary changes.  He is followed by Dr. Jens Somrenshaw at [facility].  His last appointment was 10/29/2013. At that time he complained of the dyspnea on exertion, but had no other complaints. Dr. Jens Somrenshaw discontinued clonidine and increased his labetalol to 200 mg by mouth twice a day. He also instructed the patient to purchase a blood pressure cuff and keep track of his pressures at home. He says he owns a cuff, but has not been using it lately.   He smokes cigarettes, about a pack a week. He used to smoke half a pack per day for many years. Social alcohol use. Denies drug use, including cocaine. 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  In the ED, a nitroglycerin drip was started and 40 mg IV Lasix administered.  Meds: Current Facility-Administered Medications  Medication Dose Route Frequency Provider Last Rate Last Dose  . nitroGLYCERIN 0.2 mg/mL in dextrose 5 % infusion  10-200 mcg/min Intravenous Titrated Joya Gaskinsonald W Wickline, MD 5 mL/hr at 01/24/14 1431 16.667 mcg/min at 01/24/14 1431   Current Outpatient Prescriptions  Medication Sig Dispense Refill  . amLODipine (NORVASC) 10 MG tablet Take 1 tablet (10 mg total) by mouth daily.  30 tablet  11  . aspirin EC 81 MG tablet Take 1 tablet (81 mg total) by mouth daily.  90 tablet  3  . benazepril (LOTENSIN) 40 MG tablet Take 40 mg by mouth daily.      . fish oil-omega-3 fatty acids 1000 MG capsule Take 1 g  by mouth daily.        Marland Kitchen HYDROcodone-acetaminophen (NORCO) 5-325 MG per tablet Take 1-2 tablets by mouth every 6 (six) hours as needed for moderate pain.  20 tablet  0  . labetalol (NORMODYNE) 200 MG tablet Take 1 tablet (200 mg total) by mouth 2 (two) times daily.  60 tablet  12  . potassium chloride SA (K-DUR,KLOR-CON) 20 MEQ tablet Take 1 tablet (20 mEq total) by mouth 2 (two) times daily.  60 tablet  12  . tadalafil (CIALIS) 20 MG tablet Take 1 tablet (20 mg total) by mouth daily as needed for erectile dysfunction.  20 tablet  2  . triamterene-hydrochlorothiazide (MAXZIDE-25)  37.5-25 MG per tablet Take 1 tablet by mouth daily.  30 tablet  11  . metoprolol tartrate (LOPRESSOR) 25 MG tablet Take 1 tablet (25 mg total) by mouth 2 (two) times daily.  60 tablet  11  . nitroGLYCERIN (NITROSTAT) 0.4 MG SL tablet Place 1 tablet (0.4 mg total) under the tongue every 5 (five) minutes as needed. For chest pain  25 tablet  3    Allergies: Allergies as of 01/24/2014 - Review Complete 01/24/2014  Allergen Reaction Noted  . Penicillins Other (See Comments)    Past Medical History  Diagnosis Date  . CAD (coronary artery disease)     Non obstructive  . Obesity   . History of syncope 08/2008    Secondary to hypertension  . Tobacco abuse   . Hypertension, essential, benign     a. renal art [place] (6/14): no RAS  . Chest pain, unspecified   . Noncompliance   . CKD (chronic kidney disease)    History reviewed. No pertinent past surgical history. Family History  Problem Relation Age of Onset  . Lupus Mother   . Heart disease Maternal Grandfather   . Coronary artery disease Neg Hx    History   Social History  . Marital Status: Divorced    Spouse Name: N/A    Number of Children: N/A  . Years of Education: N/A   Occupational History  . CNA    Social History Main Topics  . Smoking status: Former Smoker -- 0.30 packs/day for 32 years    Quit date: 05/11/2013  . Smokeless tobacco: Not on file     Comment: Started smoking at age 2 (61)  . Alcohol Use: No  . Drug Use: No  . Sexual Activity: Not Currently   Other Topics Concern  . 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  Physical Exam: Blood pressure 249/168, pulse 66, temperature 98.7 F (37.1 C), temperature source Oral, resp. rate 18, height 5\' 7"  (1.702 m), weight 190 lb 11.2 oz (86.5 kg), SpO2 97.00%. Physical Exam  Constitutional: He is oriented  to person, place, and time and well-developed, well-nourished, and in no distress.  HENT:  Head: Normocephalic and atraumatic.  Eyes: Conjunctivae and EOM are normal. Pupils are equal, round, and reactive to light.  Neck: Normal range of motion. Neck supple. No JVD present.  Cardiovascular: Normal rate, regular rhythm, normal heart sounds and intact distal pulses.  Exam reveals no gallop and no friction rub.   No murmur heard. Pulmonary/Chest: Effort normal and breath sounds normal. No respiratory distress. He has no wheezes. He has no rales. He  exhibits no tenderness.  Abdominal: Soft. Bowel sounds are normal. He exhibits no distension. There is no tenderness.  Musculoskeletal: Normal range of motion. He exhibits no edema and no tenderness.  Neurological: He is alert and oriented to person, place, and time. No cranial nerve deficit. GCS score is 15.  Skin: Skin is warm and dry. He is not diaphoretic.  Psychiatric: Affect normal.    Lab results: Basic Metabolic Panel:  Recent Labs  04/54/09 1218  NA 139  K 3.1*  CL 96  CO2 27  GLUCOSE 93  BUN 23  CREATININE 1.95*  CALCIUM 9.3   Liver Function Tests: No results found for this basename: AST, ALT, ALKPHOS, BILITOT, PROT, ALBUMIN,  in the last 72 hours No results found for this basename: LIPASE, AMYLASE,  in the last 72 hours No results found for this basename: AMMONIA,  in the last 72 hours CBC:  Recent Labs  01/24/14 1218  WBC 7.1  HGB 15.4  HCT 42.7  MCV 86.3  PLT 313   Cardiac Enzymes: No results found for this basename: CKTOTAL, CKMB, CKMBINDEX, TROPONINI,  in the last 72 hours BNP:  Recent Labs  01/24/14 1215  PROBNP 2207.0*   D-Dimer: No results found for this basename: DDIMER,  in the last 72 hours CBG: No results found for this basename: GLUCAP,  in the last 72 hours Hemoglobin A1C: No results found for this basename: HGBA1C,  in the last 72 hours Fasting Lipid Panel: No results found for this  basename: CHOL, HDL, LDLCALC, TRIG, CHOLHDL, LDLDIRECT,  in the last 72 hours Thyroid Function Tests: No results found for this basename: TSH, T4TOTAL, FREET4, T3FREE, THYROIDAB,  in the last 72 hours Anemia Panel: No results found for this basename: VITAMINB12, FOLATE, FERRITIN, TIBC, IRON, RETICCTPCT,  in the last 72 hours Coagulation: No results found for this basename: LABPROT, INR,  in the last 72 hours Urine Drug Screen: Drugs of Abuse  No results found for this basename: labopia,  cocainscrnur,  labbenz,  amphetmu,  thcu,  labbarb    Alcohol Level: No results found for this basename: ETH,  in the last 72 hours Urinalysis: No results found for this basename: COLORURINE, APPERANCEUR, LABSPEC, PHURINE, GLUCOSEU, HGBUR, BILIRUBINUR, KETONESUR, PROTEINUR, UROBILINOGEN, NITRITE, LEUKOCYTESUR,  in the last 72 hours   Imaging results:  Dg Chest Portable 1 View  01/24/2014   CLINICAL DATA:  Shortness of breath, epistaxis  EXAM: PORTABLE CHEST - 1 VIEW  COMPARISON:  05/11/2013  FINDINGS: Low volume chest exam. Mild cardiac enlargement with central vascular congestion. No definite CHF or pneumonia. No effusion, collapse or consolidation. Negative for pneumothorax. Trachea midline. Degenerative changes of the spine evident. Atherosclerosis of the aorta.  IMPRESSION: Low volume exam with vascular congestion   Electronically Signed   By: Ruel Favors M.D.   On: 01/24/2014 12:12    Other results: EKG: Rate 84 beats per minute. Normal sinus rhythm. Normal axis. Left ventricular hypertrophy pattern. T wave inversions in V5 through V6. These are new compared to prior EKG from December 2014.  Assessment & Plan by Problem: LEXINGTON KROTZ is a 48 y.o. male with PMH HTN, non-obstructive CAD (cath in 2009 revealed normal LV function, 40-50% LAD, 30-40% Lcx, 50% RCA), CHF (Echo 6/14 EF 45-50%, grade 1 diastolic dysfunction), obesity, CKD stage 3 who presents to emergency department with a chief complaint of  epistaxis and shortness of breath, noted to have hypertension.  #Accelerated hypertension - Patient presents with significantly elevated  blood pressures associated with epistaxis and shortness of breath. For his blood pressure at home, he takes amlodipine 10 mg daily, benazepril 40 mg daily, labetalol 200 mg twice a day, triamterene-HCTZ 37.5-25 mg per day, and metoprolol 25 mg twice a day. His cardiologist is Dr. [name]. He reports good medication compliance and no dietary changes. Denies using cocaine. - Admit to IMTS, step down - Continue nitroglycerin drip, we will be careful not to lower the blood pressure too quickly or too much (gradual 25% reduction over the next 24 hours) - Urine drug screen - Cardiac monitoring - Tylenol when necessary for pain - Heart healthy diet - BMP, CBC in am  #History of CHF - 2-D echo in June 2014 showed mildly reduced systolic function with an EF of 45-50%, no wall motion abnormalities, grade 1 diastolic dysfunction. proBNP today is elevated at 2207. In the past, it has been ~160. However, for several reasons I do not think this patient is in an acute exacerbation. Chest x-ray was notable for mild cardiac enlargement and central vascular congestion, though there was no definite evidence of pulmonary edema. There was no evidence of volume overload on exam, including no edema and no JVD. Weight today is 198 pounds. It was 197 pounds in December 2014. He denies orthopnea or leg swelling at home. He does not take Lasix at home. He is status post 40 mg Lasix IV in the ED.  - Strict intake and output - Daily weights - Continue to monitor volume status  #History of nonobstructicve CAD - Cath in 2009 showed 40-50% LAD, 30-40% Lcx, 50% RCA. He reports intermittent episodes of dyspnea and chest pain associated with exertion, but none currently. This could represent anginal pain vs. Worsening of his CHF (see above). There are new T wave inversions in the lateral leads on  his EKG. Point-of-care troponin was negative. - Trending troponins - Repeat EKG in am - Continue daily aspirin 81mg   #Hypokalemia - K 3.1 on admission. At home he takes K-dur 20 mEq twice a day. - Giving K-dur - BMP in a.m.  #OSA - Sleep study in 02/2010 was positive for moderate to severe obstructive sleep apnea. He has been noncompliant with CPAP at home. This is likely contributing to his hypertension. - CPAP QHS  #Poor medical follow up - He has no primary care doctor. He has no insurance. - Hemoglobin A1c - Lipid panel - Will arrange for outpatient follow up with either our clinic, or [facility]  #History of dyslipidemia - He should be on a statin (Pravachol 40 mg daily) per Dr. Ludwig Clarks notes, but I do not see this on his med list. - Fasting lipid panel  #Tobacco abuse - Current smoker of 1 pack per week. He is trying to quit. - Nurse to provide smoking cessation education  #Epistaxis, resolved - Likely secondary to accelerated hypertension. - Continue to monitor - Rhino rocket as needed  #DVT PPX - Heparin subq    Dispo: Disposition is deferred at this time, awaiting improvement of current medical problems. Anticipated discharge in approximately 1-3 day(s).   The patient does not have a current PCP (No Pcp Per Patient) and does not need an [facility] hospital follow-up appointment after discharge.  The patient does not have transportation limitations that hinder transportation to clinic appointments.  Signed: Vivi Barrack, MD 01/24/2014, 6:03 PM

## 2014-01-24 NOTE — ED Notes (Signed)
Patient is prescribed cialis but has not picked it up from pharmacy

## 2014-01-24 NOTE — Progress Notes (Signed)
Night Float Interim Progress Note  Called by RN and then Rapid response RN early this evening in regards to pt's blood pressure staying elevated despite nitro gtt titration 200/110's.  Went to evaluate patient alongside Dr. Andrey Campanile. Patient reporting severe headache since nitro gtt, nausea, and occasional sweating. Denies chest pain, vomiting, or abdominal pain. He did urinate but not collected.  Did not take his BP medications today, but says he is usually complaint.   Vitals reviewed. General: resting in bed, acute distress due to headache HEENT: EOMI Cardiac: RRR Pulm: clear to auscultation bilaterally Abd: soft, nontender, BS present Ext: moving all 4 extremities Neuro: alert and oriented X3  Mr. Florer is a 48 year old African American male with PMH of HTN, non-obstructive CAD, CHF, obesity, and CKD3 admitted accelerated hypertension with epistaxis and severely elevated BP 200/100's.  Started on nitro gtt. Now with severe headache after starting nitro gtt and nausea.   -admits to "holding cocaine in his hands" but not smoking it. UDS to be collected -no relief of headache with tylenol, will try morphine 1mg  iv x1 -phenergan prn nausea -home medications include norvasc 10mg  qd, lotensin 40mg , labetalol 200mg  bid, lopressor 25mg  bid, and maxzide 25mg  qd.  -will restart home norvasc at this time and try to titrate down nitro gtt due to patient discomfort with gradual reduction of bp MAP reduction~25%/24 hours. Hold home BB and diuretics given possible cocaine use and renal function.  -will continue to monitor  Signed: Darden Palmer, MD PGY-2, Internal Medicine Resident Pager: [phone]  01/24/2014,11:39 PM

## 2014-01-24 NOTE — ED Provider Notes (Signed)
CSN: [id number]     Arrival date & time 01/24/14  1109 History   First MD Initiated Contact with Patient 01/24/14 1136     Chief Complaint  Patient presents with  . Epistaxis  . Shortness of Breath      Patient is a 48 y.o. male presenting with nosebleeds and shortness of breath. The history is provided by the patient.  Epistaxis Location:  Unable to specify Duration:  1 hour Timing:  Intermittent Progression:  Improving Chronicity:  New Worsened by:  Nothing tried Associated symptoms: no fever   Shortness of Breath Severity:  Moderate Onset quality:  Gradual Duration:  1 month Timing:  Intermittent Progression:  Worsening Chronicity:  New Relieved by:  Rest Worsened by:  Activity Associated symptoms: chest pain   Associated symptoms: no fever and no vomiting   Associated symptoms comment:  Chest pain with exertion  pt reports for past month he has had Dyspnea on exertion He also reports CP with exertion but none currently No LE edema   He also reports nose bleed earlier today that has since stopped No trauma to face reported  Past Medical History  Diagnosis Date  . CAD (coronary artery disease)     Non obstructive  . Obesity   . History of syncope 08/2008    Secondary to hypertension  . Tobacco abuse   . Hypertension, essential, benign     a. renal art [place] (6/14): no RAS  . Chest pain, unspecified   . Noncompliance   . CKD (chronic kidney disease)    History reviewed. No pertinent past surgical history. Family History  Problem Relation Age of Onset  . Lupus Mother   . Heart disease Maternal Grandfather   . Coronary artery disease Neg Hx    History  Substance Use Topics  . Smoking status: Former Smoker -- 0.30 packs/day for 32 years    Quit date: 05/11/2013  . Smokeless tobacco: Not on file     Comment: Started smoking at age 55 (33)  . Alcohol Use: No    Review of Systems  Constitutional: Negative for fever.  HENT: Positive for nosebleeds.    Respiratory: Positive for shortness of breath.   Cardiovascular: Positive for chest pain.  Gastrointestinal: Negative for vomiting.  All other systems reviewed and are negative.      Allergies  Penicillins  Home Medications   Current Outpatient Rx  Name  Route  Sig  Dispense  Refill  . amLODipine (NORVASC) 10 MG tablet   Oral   Take 1 tablet (10 mg total) by mouth daily.   30 tablet   11   . aspirin EC 81 MG tablet   Oral   Take 1 tablet (81 mg total) by mouth daily.   90 tablet   3   . benazepril (LOTENSIN) 40 MG tablet   Oral   Take 40 mg by mouth daily.         . fish oil-omega-3 fatty acids 1000 MG capsule   Oral   Take 1 g by mouth daily.           Marland Kitchen HYDROcodone-acetaminophen (NORCO) 5-325 MG per tablet   Oral   Take 1-2 tablets by mouth every 6 (six) hours as needed for moderate pain.   20 tablet   0   . labetalol (NORMODYNE) 200 MG tablet   Oral   Take 1 tablet (200 mg total) by mouth 2 (two) times daily.   60 tablet  12   . potassium chloride SA (K-DUR,KLOR-CON) 20 MEQ tablet   Oral   Take 1 tablet (20 mEq total) by mouth 2 (two) times daily.   60 tablet   12   . tadalafil (CIALIS) 20 MG tablet   Oral   Take 1 tablet (20 mg total) by mouth daily as needed for erectile dysfunction.   20 tablet   2   . triamterene-hydrochlorothiazide (MAXZIDE-25) 37.5-25 MG per tablet   Oral   Take 1 tablet by mouth daily.   30 tablet   11   . EXPIRED: metoprolol tartrate (LOPRESSOR) 25 MG tablet   Oral   Take 1 tablet (25 mg total) by mouth 2 (two) times daily.   60 tablet   11   . nitroGLYCERIN (NITROSTAT) 0.4 MG SL tablet   Sublingual   Place 1 tablet (0.4 mg total) under the tongue every 5 (five) minutes as needed. For chest pain   25 tablet   3    BP 207/123  Pulse 91  Temp(Src) 98.2 F (36.8 C) (Oral)  Resp 20  Wt 198 lb (89.812 kg)  SpO2 87% BP 192/119  Pulse 82  Temp(Src) 98.2 F (36.8 C) (Oral)  Resp 21  Wt 198 lb (89.812  kg)  SpO2 99%  Initial hypoxia improved, now >95% on RA Physical Exam CONSTITUTIONAL: Well developed/well nourished HEAD: Normocephalic/atraumatic EYES: EOMI/PERRL ENMT: Mucous membranes moist, no signs of trauma, no active bleeding noted in either nare or oropharynx NECK: supple no meningeal signs SPINE:entire spine nontender CV: S1/S2 noted, no murmurs/rubs/gallops noted LUNGS: crackles noted in each base, no distress noted ABDOMEN: soft, nontender, no rebound or guarding GU:no cva tenderness NEURO: Pt is awake/alert, moves all extremitiesx4 EXTREMITIES: pulses normal, full ROM, no LE edema noted SKIN: warm, color normal PSYCH: no abnormalities of mood noted  ED Course  Procedures   3:03 PM Pt here with SOB and CHF He is significantly hypertensive.  I suspect hypertensive emergency.  IV nitroglycerin has been ordered and IV lasix D/w medicine, will admit to stepdown  CRITICAL CARE Performed by: Joya Gaskins Total critical care time: 31 Critical care time was exclusive of separately billable procedures and treating other patients. Critical care was necessary to treat or prevent imminent or life-threatening deterioration. Critical care was time spent personally by me on the following activities: development of treatment plan with patient and/or surrogate as well as nursing, discussions with consultants, evaluation of patient's response to treatment, examination of patient, obtaining history from patient or surrogate, ordering and performing treatments and interventions, ordering and review of laboratory studies, ordering and review of radiographic studies, pulse oximetry and re-evaluation of patient's condition.  Labs Review Labs Reviewed  CBC - Abnormal; Notable for the following:    MCHC 36.1 (*)    All other components within normal limits  BASIC METABOLIC PANEL - Abnormal; Notable for the following:    Potassium 3.1 (*)    Creatinine, Ser 1.95 (*)    GFR calc non  Af Amer 39 (*)    GFR calc Af Amer 45 (*)    All other components within normal limits  PRO B NATRIURETIC PEPTIDE - Abnormal; Notable for the following:    Pro B Natriuretic peptide (BNP) 2207.0 (*)    All other components within normal limits  I-STAT TROPOININ, ED   Imaging Review Dg Chest Portable 1 View  01/24/2014   CLINICAL DATA:  Shortness of breath, epistaxis  EXAM: PORTABLE CHEST - 1 VIEW  COMPARISON:  05/11/2013  FINDINGS: Low volume chest exam. Mild cardiac enlargement with central vascular congestion. No definite CHF or pneumonia. No effusion, collapse or consolidation. Negative for pneumothorax. Trachea midline. Degenerative changes of the spine evident. Atherosclerosis of the aorta.  IMPRESSION: Low volume exam with vascular congestion   Electronically Signed   By: Ruel Favorsrevor  Shick M.D.   On: 01/24/2014 12:12     EKG Interpretation   Date/Time:  Thursday January 24 2014 11:31:02 EST Ventricular Rate:  91 PR Interval:  150 QRS Duration: 96 QT Interval:  406 QTC Calculation: 499 R Axis:   22 Text Interpretation:  Normal sinus rhythm Biatrial enlargement Left  ventricular hypertrophy with repolarization abnormality Prolonged QT  Abnormal ECG Confirmed by Bebe ShaggyWICKLINE  MD, Dorinda HillNALD ([id number]) on 01/24/2014  11:44:42 AM      MDM   Final diagnoses:  Acute CHF  Hypertensive emergency  AKI (acute kidney injury)    Nursing notes including past medical history and social history reviewed and considered in documentation xrays reviewed and considered Labs/vital reviewed and considered     Joya Gaskinsonald W Kellon Chalk, MD 01/24/14 1505

## 2014-01-24 NOTE — ED Notes (Signed)
Per pt sts that he had a nosebleed that started this am and lasted about 1 hour. Bleeding currently controlled. Upon obtaining pt vitals sats are low on RA .WHen talking to pt sts he has been SOB over the past month, especially when walking and diaphoretic. sts when he walks his chest starts to hurt.

## 2014-01-25 DIAGNOSIS — I10 Essential (primary) hypertension: Secondary | ICD-10-CM

## 2014-01-25 LAB — BASIC METABOLIC PANEL
BUN: 22 mg/dL (ref 6–23)
CHLORIDE: 94 meq/L — AB (ref 96–112)
CO2: 30 meq/L (ref 19–32)
CREATININE: 2.08 mg/dL — AB (ref 0.50–1.35)
Calcium: 9.2 mg/dL (ref 8.4–10.5)
GFR calc Af Amer: 42 mL/min — ABNORMAL LOW (ref >90)
GFR calc non Af Amer: 36 mL/min — ABNORMAL LOW (ref >90)
GLUCOSE: 97 mg/dL (ref 70–99)
Potassium: 3.5 mEq/L — ABNORMAL LOW (ref 3.7–5.3)
Sodium: 138 mEq/L (ref 137–147)

## 2014-01-25 LAB — CBC
HEMATOCRIT: 41.2 % (ref 39.0–52.0)
Hemoglobin: 14.5 g/dL (ref 13.0–17.0)
MCH: 30.8 pg (ref 26.0–34.0)
MCHC: 35.2 g/dL (ref 30.0–36.0)
MCV: 87.5 fL (ref 78.0–100.0)
Platelets: 311 10*3/uL (ref 150–400)
RBC: 4.71 MIL/uL (ref 4.22–5.81)
RDW: 13.8 % (ref 11.5–15.5)
WBC: 10.2 10*3/uL (ref 4.0–10.5)

## 2014-01-25 LAB — RAPID URINE DRUG SCREEN, HOSP PERFORMED
AMPHETAMINES: NOT DETECTED
BARBITURATES: NOT DETECTED
BENZODIAZEPINES: NOT DETECTED
Cocaine: POSITIVE — AB
Opiates: POSITIVE — AB
Tetrahydrocannabinol: NOT DETECTED

## 2014-01-25 LAB — LIPID PANEL
Cholesterol: 260 mg/dL — ABNORMAL HIGH (ref 0–200)
HDL: 41 mg/dL (ref >39)
LDL CALC: 189 mg/dL — AB (ref 0–99)
Total CHOL/HDL Ratio: 6.3 RATIO
Triglycerides: 150 mg/dL — ABNORMAL HIGH (ref <150)
VLDL: 30 mg/dL (ref 0–40)

## 2014-01-25 LAB — TROPONIN I: Troponin I: <0.3 ng/mL (ref <0.30)

## 2014-01-25 LAB — HEMOGLOBIN A1C
Hgb A1c MFr Bld: 5.4 % (ref <5.7)
Mean Plasma Glucose: 108 mg/dL (ref <117)

## 2014-01-25 MED ORDER — BENAZEPRIL HCL 40 MG PO TABS
40.0000 mg | ORAL_TABLET | Freq: Every day | ORAL | Status: DC
  Filled 2014-01-25: qty 1

## 2014-01-25 MED ORDER — POTASSIUM CHLORIDE CRYS ER 20 MEQ PO TBCR
40.0000 meq | EXTENDED_RELEASE_TABLET | Freq: Once | ORAL | Status: AC
  Administered 2014-01-25: 40 meq via ORAL
  Filled 2014-01-25: qty 2

## 2014-01-25 MED ORDER — LOVASTATIN 20 MG PO TABS
40.0000 mg | ORAL_TABLET | Freq: Every day | ORAL | Status: DC
Start: 2014-01-25 — End: 2015-12-15

## 2014-01-25 MED ORDER — TRIAMTERENE-HCTZ 37.5-25 MG PO TABS
1.0000 | ORAL_TABLET | Freq: Every day | ORAL | Status: DC
  Administered 2014-01-25: 1 via ORAL
  Filled 2014-01-25: qty 1

## 2014-01-25 MED ORDER — LABETALOL HCL 200 MG PO TABS
200.0000 mg | ORAL_TABLET | Freq: Two times a day (BID) | ORAL | Status: DC
  Filled 2014-01-25 (×2): qty 1

## 2014-01-25 MED ORDER — BENAZEPRIL HCL 40 MG PO TABS
40.0000 mg | ORAL_TABLET | Freq: Every day | ORAL | Status: DC
  Administered 2014-01-25: 40 mg via ORAL
  Filled 2014-01-25: qty 1

## 2014-01-25 MED ORDER — POTASSIUM CHLORIDE CRYS ER 20 MEQ PO TBCR
20.0000 meq | EXTENDED_RELEASE_TABLET | Freq: Two times a day (BID) | ORAL | Status: DC
  Administered 2014-01-25: 20 meq via ORAL
  Filled 2014-01-25: qty 1

## 2014-01-25 MED ORDER — LABETALOL HCL 200 MG PO TABS
200.0000 mg | ORAL_TABLET | Freq: Two times a day (BID) | ORAL | Status: DC
  Administered 2014-01-25: 200 mg via ORAL
  Filled 2014-01-25 (×2): qty 1

## 2014-01-25 MED ORDER — AMLODIPINE BESYLATE 10 MG PO TABS
10.0000 mg | ORAL_TABLET | Freq: Every day | ORAL | Status: DC
  Administered 2014-01-25: 10 mg via ORAL
  Filled 2014-01-25: qty 1

## 2014-01-25 MED ORDER — SIMVASTATIN 40 MG PO TABS
40.0000 mg | ORAL_TABLET | Freq: Every day | ORAL | Status: DC
  Filled 2014-01-25: qty 1

## 2014-01-25 NOTE — H&P (Signed)
INTERNAL MEDICINE TEACHING SERVICE Attending Admission Note  Date: 01/25/2014  Patient name: [name]  Medical record number: [id number]  Date of birth: Nov 10, 1966    I have seen and evaluated [name] and discussed their care with the Residency Team.  48 yr old man with pmhx significant for CAD, HTN, HFpEF, CKD stage 3, presented with nose bleed and SOB. He admits to me that he recently use cocaine (snorted) at a party. He states he began to get worried when his nose would not stop bleeding and he became SOB. He further states to progressive DOE for months. He denies CP. He denies orthopnea. Denies PND. Does LE swelling. He is followed by Dr. [name] as outpatient. States he takes his medications. On admission, BP noted to be 203/132. He was started on NTG gtt and given Lasix 40 mg IV.  On exam, Filed Vitals:   01/25/14 1100  BP: 190/136  Pulse: 64  Temp:   Resp:   GEN: AAOx3, NAD HEENT: no nasal ulcerations noted. CV: S1S2, no m/r/g, RRR. No JVD. PULM: CTA bilat ABD/GI: Soft, NT, +BS, no guarding. LE: no edema. 2/4 pulses.  At this time, resume his home medications. Wean off NTG gtt. Educated him on cocaine use and effect on HTN. Would use labetalol but no combine with metoprolol. Clinically, I don't think he is in heart failure. Target inpatient BP on oral therapy less than 180/90 mmHg then D/C home. His epistaxis has stopped and likely is a result of cocaine snorting.  Jonah Blue, DO, FACP Faculty [facility] Internal Medicine Residency Program 01/25/2014, 12:05 PM

## 2014-01-25 NOTE — Discharge Summary (Signed)
Name: [name]: [id number] DOB: 1966-09-06 48 y.o. PCP: No Pcp Per Patient - To follow up in [facility]  Date of Admission: 01/24/2014 11:34 AM Date of Discharge: 01/25/2014 Attending Physician: Jonah Blue, DO  Discharge Diagnosis: 1. Accelerated hypertension likely 2/2 cocaine use 2. Poor medical follow up 3. History of CHF 4. History of nonobstructive CAD 5. Hypokalemia 6. OSA 7. History of dyslipidemia  Discharge Medications:   Medication List    STOP taking these medications       metoprolol tartrate 25 MG tablet  Commonly known as:  LOPRESSOR      TAKE these medications       amLODipine 10 MG tablet  Commonly known as:  NORVASC  Take 1 tablet (10 mg total) by mouth daily.     aspirin EC 81 MG tablet  Take 1 tablet (81 mg total) by mouth daily.     benazepril 40 MG tablet  Commonly known as:  LOTENSIN  Take 40 mg by mouth daily.     fish oil-omega-3 fatty acids 1000 MG capsule  Take 1 g by mouth daily.     HYDROcodone-acetaminophen 5-325 MG per tablet  Commonly known as:  NORCO  Take 1-2 tablets by mouth every 6 (six) hours as needed for moderate pain.     labetalol 200 MG tablet  Commonly known as:  NORMODYNE  Take 1 tablet (200 mg total) by mouth 2 (two) times daily.     lovastatin 20 MG tablet  Commonly known as:  MEVACOR  Take 2 tablets (40 mg total) by mouth at bedtime.     nitroGLYCERIN 0.4 MG SL tablet  Commonly known as:  NITROSTAT  Place 1 tablet (0.4 mg total) under the tongue every 5 (five) minutes as needed. For chest pain     potassium chloride SA 20 MEQ tablet  Commonly known as:  K-DUR,KLOR-CON  Take 1 tablet (20 mEq total) by mouth 2 (two) times daily.     tadalafil 20 MG tablet  Commonly known as:  CIALIS  Take 1 tablet (20 mg total) by mouth daily as needed for erectile dysfunction.     triamterene-hydrochlorothiazide 37.5-25 MG per tablet  Commonly known as:  MAXZIDE-25  Take 1 tablet by mouth daily.         Disposition and follow-up:   [name] was discharged from [facility] in Stable condition.  At the hospital follow up visit please address:  1.  BP control? Consult to [facility] for help affording CPAP.  2.  Labs / imaging needed at time of follow-up: BMP, EKG  3.  Pending labs/ test needing follow-up: None  Follow-up Appointments: Follow-up Information   Follow up with Genella Mech, MD On 01/31/2014. (@3pm . This is your new primary care doctor.)    Specialty:  Internal Medicine   Contact information:   [address] [place] [address] [phone]       Follow up with Olga Millers, MD On 01/28/2014. (@8 :15 AM. This is your cardiologist.)    Specialty:  Cardiology   Contact information:   [address] [place] [address] [phone]       Discharge Instructions: Discharge Orders   Future Appointments Provider Department Dept Phone   01/28/2014 8:15 AM Lewayne Bunting, MD [facility] [place] Office [phone]   01/31/2014 3:00 PM Judie Bonus, MD [facility] [phone]   Future Orders Complete By Expires  Call MD for:  difficulty breathing, headache or visual disturbances  As directed    Call MD for:  persistant dizziness or light-headedness  As directed    Call MD for:  severe uncontrolled pain  As directed    Diet - low sodium heart healthy  As directed    Increase activity slowly  As directed       Consultations:  None  Procedures Performed:  Dg Chest Portable 1 View  01/24/2014   CLINICAL DATA:  Shortness of breath, epistaxis  EXAM: PORTABLE CHEST - 1 VIEW  COMPARISON:  05/11/2013  FINDINGS: Low volume chest exam. Mild cardiac enlargement with central vascular congestion. No definite CHF or pneumonia. No effusion, collapse or consolidation. Negative for pneumothorax. Trachea midline. Degenerative changes of the spine evident. Atherosclerosis of the aorta.  IMPRESSION: Low  volume exam with vascular congestion   Electronically Signed   By: Ruel Favors M.D.   On: 01/24/2014 12:12    Admission HPI:  [name] is a 48 y.o. male with PMH HTN, non-obstructive CAD (cath in 2009 revealed normal LV function, 40-50% LAD, 30-40% Lcx, 50% RCA), CHF (Echo 6/14 EF 45-50%, grade 1 diastolic dysfunction), obesity, CKD stage 3 who presents to emergency department with a chief complaint of epistaxis and shortness of breath, noted to have hypertension.   Patient reports he awoke from sleep at 2am this morning with a nosebleed. It was "dripping blood" all day, so he decided to come to the ED. He cannot quantify the amount. He denies using any nasal sprays or antihistamines/decongestants. He has not had a nosebleed since he was a kid. Upon arrival at the ED, the bleeding stopped on its on. However, he was noted to have significant hypertension to 203/132.  On further questioning, he also endorses occasional shortness of breath with walking, ongoing for several months. He thinks it has gradually worsened over the past month. He also notes occasional sharp, sternal, non-radiating chest pain with exertion, but he denies chest pain currently. He has blurry vision sometimes. Otherwise, he denies orthopnea, leg swelling, weight gain. He denies fevers, chills, abdominal pain, nausea, vomiting, diarrhea, syncope, dizziness, weakness. He reports good compliance with his medications. He denies recent dietary changes.   He is followed by Dr. [name] at [facility]. His last appointment was 10/29/2013. At that time he complained of the dyspnea on exertion, but had no other complaints. Dr. [name] discontinued clonidine and increased his labetalol to 200 mg by mouth twice a day. He also instructed the patient to purchase a blood pressure cuff and keep track of his pressures at home. He says he owns a cuff, but has not been using it lately.   He smokes cigarettes, about a pack a week. He used to  smoke half a pack per day for many years. Social alcohol use. Denies drug use, including cocaine. 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   In the ED, a nitroglycerin drip was started and 40 mg IV Lasix administered.  Physical Exam:  Blood pressure 249/168, pulse 66, temperature 98.7 F (37.1 C), temperature source Oral, resp. rate 18, height 5\' 7"  (1.702 m), weight 190 lb 11.2 oz (86.5 kg), SpO2 97.00%.  Physical Exam  Constitutional: He is oriented to person, place, and time and well-developed, well-nourished, and in no distress.  HENT:  Head: Normocephalic and atraumatic.  Eyes: Conjunctivae and EOM are normal. Pupils are equal, round, and reactive to light.  Neck: Normal range of motion. Neck supple. No JVD present.  Cardiovascular: Normal rate, regular rhythm, normal heart sounds and intact distal pulses. Exam reveals no gallop and no friction rub.  No murmur heard.  Pulmonary/Chest: Effort normal and breath sounds normal. No respiratory distress. He has no wheezes. He has no rales. He exhibits no tenderness.  Abdominal: Soft. Bowel sounds are normal. He exhibits no distension. There is no tenderness.  Musculoskeletal: Normal range of motion. He exhibits no edema and no tenderness.  Neurological: He is alert and oriented to person, place, and time. No cranial nerve deficit. GCS score is 15.  Skin: Skin is warm and dry. He is not diaphoretic.  Psychiatric: Affect normal.    Hospital Course by problem list: [name] is a 48 y.o. male with PMH HTN, non-obstructive CAD (cath in 2009 revealed normal LV function, 40-50% LAD, 30-40% Lcx, 50% RCA), CHF (Echo 6/14 EF 45-50%, grade 1 diastolic dysfunction), obesity, CKD stage 3 who presents to emergency department with a chief complaint of epistaxis and shortness of breath, noted to have hypertension.   1. Accelerated hypertension likely 2/2 cocaine use - Patient presents with significantly elevated blood  pressures associated with epistaxis and shortness of breath. For his blood pressure at home, he takes amlodipine 10 mg daily, benazepril 40 mg daily, labetalol 200 mg twice a day, triamterene-HCTZ 37.5-25 mg per day, and metoprolol 25 mg twice a day. His cardiologist is Dr. [name]. He reports good medication compliance and no dietary changes. Blood pressure improved overnight on nitro gtt. Though he denied it initially, he tested positive for cocaine. I explained to him how dangerous this is given his history of high blood pressure and heart disease, as it can cause stroke and death. It cannot even be a "one time thing." We started back his home medications and titrated down the nitro drip. We stopped his home metoprolol 25 mg twice a day given cocaine use. Outpatient cardiology followup has been arranged.  2. Poor medical follow up - He has no primary care doctor. He has no insurance. A1C 5.4. Care management arranged for a match letter and the patient will follow up with our clinic.  3. History of CHF - 2-D echo in June 2014 showed mildly reduced systolic function with an EF of 45-50%, no wall motion abnormalities, grade 1 diastolic dysfunction. proBNP was elevated at 2207. In the past, it has been ~160. However, for several reasons I did not think this patient was in an acute exacerbation. Chest x-ray was notable for mild cardiac enlargement and central vascular congestion, though there was no definite evidence of pulmonary edema. There was no evidence of volume overload on exam, including no edema and no JVD. Weight was 198 pounds; it was 197 pounds in December 2014. He denies orthopnea or leg swelling at home. We held off on diuresis and monitored volume status.  4. History of nonobstructive CAD - Cath in 2009 showed 40-50% LAD, 30-40% Lcx, 50% RCA. He reports intermittent episodes of dyspnea and chest pain associated with exertion, but none currently. There were new T wave inversions in the lateral  leads on his admission EKG. Point-of-care troponin was negative. Troponins were trended and negative x3. Repeat EKG still showed nonspecific TWI in lateral leads, LVH, and J point elevation V1-V2. We continued daily aspirin 81mg . Outpatient cardiology followup was arranged. Recommend repeat EKG.  5. Hypokalemia - K 3.5 , resumed home K-dur BID.  6. OSA - Sleep study in  02/2010 was positive for moderate to severe obstructive sleep apnea. He cannot afford CPAP at home. This is likely contributing to his hypertension. As an outpatient in the [facility] we should consult Shana to help him with this.  7. History of dyslipidemia - Lipid panel confirms dyslipidemia. He warrants a statin either way given his CVD risk. He tells me he was previously on Lipitor but is not sure why it was stopped. He denies a history of muscle pain or liver problems. We provided Simvastatin 40mg  QHS inpatient and prescribed lovastatin as outpatient (this is on the [employer] list).  Lipid Panel    Component  Value  Date/Time    CHOL  260*  01/25/2014 0232    TRIG  150*  01/25/2014 0232    HDL  41  01/25/2014 0232    CHOLHDL  6.3  01/25/2014 0232    VLDL  30  01/25/2014 0232    LDLCALC  189*  01/25/2014 0232    8. Tobacco abuse - Current smoker of 1 pack per week. He is trying to quit. Nurse provided smoking cessation education.  9. Epistaxis, resolved - Likely secondary to cocaine use. No episodes here, it stopped on its on in the emergency department.   Discharge Vitals:   BP 149/80  Pulse 86  Temp(Src) 98.3 F (36.8 C) (Oral)  Resp 20  Ht 5\' 7"  (1.702 m)  Wt 190 lb 12.8 oz (86.546 kg)  BMI 29.88 kg/m2  SpO2 98%  Discharge Labs:  Results for orders placed during the hospital encounter of 01/24/14 (from the past 24 hour(s))  MRSA PCR SCREENING     Status: None   Collection Time    01/24/14  4:38 PM      Result Value Ref Range   MRSA by PCR NEGATIVE  NEGATIVE  HEMOGLOBIN A1C     Status: None   Collection Time     01/24/14  7:52 PM      Result Value Ref Range   Hemoglobin A1C 5.4  <5.7 %   Mean Plasma Glucose 108  <117 mg/dL  TROPONIN I     Status: None   Collection Time    01/24/14  7:52 PM      Result Value Ref Range   Troponin I <0.30  <0.30 ng/mL  MAGNESIUM     Status: None   Collection Time    01/24/14 10:15 PM      Result Value Ref Range   Magnesium 2.0  1.5 - 2.5 mg/dL  BASIC METABOLIC PANEL     Status: Abnormal   Collection Time    01/25/14  2:32 AM      Result Value Ref Range   Sodium 138  137 - 147 mEq/L   Potassium 3.5 (*) 3.7 - 5.3 mEq/L   Chloride 94 (*) 96 - 112 mEq/L   CO2 30  19 - 32 mEq/L   Glucose, Bld 97  70 - 99 mg/dL   BUN 22  6 - 23 mg/dL   Creatinine, Ser 1.61 (*) 0.50 - 1.35 mg/dL   Calcium 9.2  8.4 - 09.6 mg/dL   GFR calc non Af Amer 36 (*) >90 mL/min   GFR calc Af Amer 42 (*) >90 mL/min  CBC     Status: None   Collection Time    01/25/14  2:32 AM      Result Value Ref Range   WBC 10.2  4.0 - 10.5 K/uL   RBC 4.71  4.22 -  5.81 MIL/uL   Hemoglobin 14.5  13.0 - 17.0 g/dL   HCT 16.141.2  09.639.0 - 04.552.0 %   MCV 87.5  78.0 - 100.0 fL   MCH 30.8  26.0 - 34.0 pg   MCHC 35.2  30.0 - 36.0 g/dL   RDW 40.913.8  81.111.5 - 91.415.5 %   Platelets 311  150 - 400 K/uL  LIPID PANEL     Status: Abnormal   Collection Time    01/25/14  2:32 AM      Result Value Ref Range   Cholesterol 260 (*) 0 - 200 mg/dL   Triglycerides 782150 (*) <150 mg/dL   HDL 41  >95>39 mg/dL   Total CHOL/HDL Ratio 6.3     VLDL 30  0 - 40 mg/dL   LDL Cholesterol 621189 (*) 0 - 99 mg/dL  URINE RAPID DRUG SCREEN (HOSP PERFORMED)     Status: Abnormal   Collection Time    01/25/14  7:01 AM      Result Value Ref Range   Opiates POSITIVE (*) NONE DETECTED   Cocaine POSITIVE (*) NONE DETECTED   Benzodiazepines NONE DETECTED  NONE DETECTED   Amphetamines NONE DETECTED  NONE DETECTED   Tetrahydrocannabinol NONE DETECTED  NONE DETECTED   Barbiturates NONE DETECTED  NONE DETECTED  TROPONIN I     Status: None   Collection  Time    01/25/14  7:50 AM      Result Value Ref Range   Troponin I <0.30  <0.30 ng/mL  TROPONIN I     Status: None   Collection Time    01/25/14 12:03 PM      Result Value Ref Range   Troponin I <0.30  <0.30 ng/mL    Signed: Vivi BarrackSarah Paighton Godette, MD 01/25/2014, 4:29 PM   Time Spent on Discharge: 35 minutes [facility] Equipment Ordered on Discharge: None

## 2014-01-25 NOTE — Discharge Instructions (Signed)
It was a pleasure taking care of you. - You were admitted to the hospital with very high blood pressure after using cocaine. - In the future, please never use cocaine again as this could lead to stroke or death. Just say no. - We'll arrange for you to followup in the internal medicine clinic here. This is your new primary care clinic. Please followup next week as scheduled above. The doctors there can help arrange for you to get a CPAP machine and help you with your medications. - I have attached some literature on high blood pressure and diet. - I am starting you on a medication called lovastatin. This is a medication for your high cholesterol. - Please do NOT take Cialis with your Nitroglycerin tabs. This can be dangerous. - If you develop sudden shortness of breath or chest pain, please call the clinic at [phone] or return to the ED.

## 2014-01-25 NOTE — Progress Notes (Signed)
Discharged home , stable, discharge instructions given to pt. Belongings with pt.

## 2014-01-25 NOTE — Progress Notes (Signed)
Subjective: Patient seen and examined at the bedside this morning. He is complaining of a headache with nitro gtt, but denies chest pain, shortness of breath, orthopnea, leg swelling. He is willing to followup [facility] for primary care needs. He tells me he used to take Lipitor. He denies any side effects including muscle cramps. He is not sure why he stopped taking it. He tells me cannot afford a CPAP machine. He tells me he took cocaine at a party 3 days ago. It was a "one time thing." He tells me he does not generally have a problem affording his meds, but would appreciate a Match letter.  Objective: Vital signs in last 24 hours: Filed Vitals:   01/25/14 0900 01/25/14 0904 01/25/14 1000 01/25/14 1100  BP: 182/85 182/85 177/112 190/136  Pulse: 63  81 64  Temp:      TempSrc:      Resp:      Height:      Weight:      SpO2: 94%  99% 98%   Weight change:   Intake/Output Summary (Last 24 hours) at 01/25/14 1159 Last data filed at 01/25/14 1101  Gross per 24 hour  Intake 515.55 ml  Output    750 ml  Net -234.45 ml   Physical Exam  Constitutional: He is oriented to person, place, and time and well-developed, well-nourished, and in no distress.  HENT:  Head: Normocephalic and atraumatic.  Eyes: Conjunctivae and EOM are normal. Pupils are equal, round, and reactive to light.  Neck: Normal range of motion. Neck supple. No JVD present.  Cardiovascular: Normal rate, regular rhythm, normal heart sounds and intact distal pulses. Exam reveals no gallop and no friction rub.  No murmur heard.  Pulmonary/Chest: Effort normal and breath sounds normal. No respiratory distress. He has no wheezes. He has no rales. He exhibits no tenderness.  Abdominal: Soft. Bowel sounds are normal. He exhibits no distension. There is no tenderness.  Musculoskeletal: Normal range of motion. He exhibits no edema and no tenderness.  Neurological: He is alert and oriented to person, place, and time. No cranial  nerve deficit. GCS score is 15.  Skin: Skin is warm and dry. He is not diaphoretic.  Psychiatric: Affect normal.   Lab Results: Basic Metabolic Panel:  Recent Labs Lab 01/24/14 1218 01/24/14 2215 01/25/14 0232  NA 139  --  138  K 3.1*  --  3.5*  CL 96  --  94*  CO2 27  --  30  GLUCOSE 93  --  97  BUN 23  --  22  CREATININE 1.95*  --  2.08*  CALCIUM 9.3  --  9.2  MG  --  2.0  --    Liver Function Tests: No results found for this basename: AST, ALT, ALKPHOS, BILITOT, PROT, ALBUMIN,  in the last 168 hours No results found for this basename: LIPASE, AMYLASE,  in the last 168 hours No results found for this basename: AMMONIA,  in the last 168 hours CBC:  Recent Labs Lab 01/24/14 1218 01/25/14 0232  WBC 7.1 10.2  HGB 15.4 14.5  HCT 42.7 41.2  MCV 86.3 87.5  PLT 313 311   Cardiac Enzymes:  Recent Labs Lab 01/24/14 1952 01/25/14 0750  TROPONINI <0.30 <0.30   BNP:  Recent Labs Lab 01/24/14 1215  PROBNP 2207.0*   D-Dimer: No results found for this basename: DDIMER,  in the last 168 hours CBG: No results found for this basename: GLUCAP,  in the last 168 hours Hemoglobin A1C:  Recent Labs Lab 01/24/14 1952  HGBA1C 5.4   Fasting Lipid Panel:  Recent Labs Lab 01/25/14 0232  CHOL 260*  HDL 41  LDLCALC 189*  TRIG 150*  CHOLHDL 6.3   Thyroid Function Tests: No results found for this basename: TSH, T4TOTAL, FREET4, T3FREE, THYROIDAB,  in the last 168 hours Coagulation: No results found for this basename: LABPROT, INR,  in the last 168 hours Anemia Panel: No results found for this basename: VITAMINB12, FOLATE, FERRITIN, TIBC, IRON, RETICCTPCT,  in the last 168 hours Urine Drug Screen: Drugs of Abuse     Component Value Date/Time   LABOPIA POSITIVE* 01/25/2014 0701   COCAINSCRNUR POSITIVE* 01/25/2014 0701   LABBENZ NONE DETECTED 01/25/2014 0701   AMPHETMU NONE DETECTED 01/25/2014 0701   THCU NONE DETECTED 01/25/2014 0701   LABBARB NONE DETECTED 01/25/2014  0701    Alcohol Level: No results found for this basename: ETH,  in the last 168 hours Urinalysis: No results found for this basename: COLORURINE, APPERANCEUR, LABSPEC, PHURINE, GLUCOSEU, HGBUR, BILIRUBINUR, KETONESUR, PROTEINUR, UROBILINOGEN, NITRITE, LEUKOCYTESUR,  in the last 168 hours   Micro Results: Recent Results (from the past 240 hour(s))  MRSA PCR SCREENING     Status: None   Collection Time    01/24/14  4:38 PM      Result Value Ref Range Status   MRSA by PCR NEGATIVE  NEGATIVE Final   Comment:            The GeneXpert MRSA Assay (FDA     approved for NASAL specimens     only), is one component of a     comprehensive MRSA colonization     surveillance program. It is not     intended to diagnose MRSA     infection nor to guide or     monitor treatment for     MRSA infections.   Studies/Results: Dg Chest Portable 1 View  01/24/2014   CLINICAL DATA:  Shortness of breath, epistaxis  EXAM: PORTABLE CHEST - 1 VIEW  COMPARISON:  05/11/2013  FINDINGS: Low volume chest exam. Mild cardiac enlargement with central vascular congestion. No definite CHF or pneumonia. No effusion, collapse or consolidation. Negative for pneumothorax. Trachea midline. Degenerative changes of the spine evident. Atherosclerosis of the aorta.  IMPRESSION: Low volume exam with vascular congestion   Electronically Signed   By: Ruel Favors M.D.   On: 01/24/2014 12:12   Medications: I have reviewed the patient's current medications. Scheduled Meds: . [MAR HOLD] amLODipine  10 mg Oral Daily  . [facility] HOLD] aspirin EC  81 mg Oral Daily  . [MAR HOLD] benazepril  40 mg Oral Daily  . [MAR HOLD] heparin  5,000 Units Subcutaneous 3 times per day  . [facility] HOLD] labetalol  200 mg Oral BID  . [facility] HOLD] potassium chloride SA  20 mEq Oral BID  . [MAR HOLD] sodium chloride  3 mL Intravenous Q12H  . [facility] HOLD] triamterene-hydrochlorothiazide  1 tablet Oral Daily   Continuous Infusions: . [facility] HOLD] nitroGLYCERIN 20  mcg/min (01/25/14 1000)   PRN Meds:.[MAR HOLD] acetaminophen, [MAR HOLD] acetaminophen, [MAR HOLD] promethazine  Assessment/Plan: [name] is a 48 y.o. male with PMH HTN, non-obstructive CAD (cath in 2009 revealed normal LV function, 40-50% LAD, 30-40% Lcx, 50% RCA), CHF (Echo 6/14 EF 45-50%, grade 1 diastolic dysfunction), obesity, CKD stage 3 who presents to emergency department with a chief complaint of epistaxis and shortness of  breath, noted to have hypertension.   #Accelerated hypertension likely 2/2 cocaine use - Blood pressure improved overnight on nitro gtt. Currently 160-190s/90-110s. This is about a 20-25% reduction from admission, which is appropriate. He tested positive for cocaine. I explained to him how dangerous this is given his history of high blood pressure and heart disease, as it can cause stroke and death. It cannot even be a "one time thing." - Starting back home meds: amlodipine 10 mg daily, benazepril 40 mg daily, labetalol 200 mg twice a day (OK with cocaine), triamterene-HCTZ 37.5-25 mg per day - Stopping metoprolol 25 mg twice a day given cocaine use - Titrate off nitroglycerin drip - Cardiac monitoring  - Tylenol when necessary for pain  - Heart healthy diet   #Poor medical follow up - He has no primary care doctor. He has no insurance. A1C 5.4. - Care management consult for match letter - Will arrange for outpatient follow up with our clinic  #History of CHF - 2-D echo in June 2014 showed mildly reduced systolic function with an EF of 45-50%, no wall motion abnormalities, grade 1 diastolic dysfunction. No evidence of volume overload or CHF exacerbation on exam or imaging. - Strict intake and output > -461cc overnight - Daily weights > 189 > 190 - Continue to monitor volume status   #History of nonobstructive CAD - Cath in 2009 showed 40-50% LAD, 30-40% Lcx, 50% RCA. He reports intermittent episodes of dyspnea and chest pain associated with exertion, but  none currently. Troponins negative over the past 12 hours. Repeat EKG still shows nonspecific TWI in lateral leads, LVH, and J point elevation V1-V2. - Follow final troponin x1 - Continue daily aspirin 81mg   - Continue to monitor for chest pain  #Hypokalemia - K 3.5 this am. - Resume home K-dur BID - Daily BMP   #OSA - Sleep study in 02/2010 was positive for moderate to severe obstructive sleep apnea. He cannot afford CPAP at home. This is likely contributing to his hypertension.  - As an outpatient in the [facility] we consult Shana to help him with this  #History of dyslipidemia - Lipid panel confirms dyslipidemia. He warrants a statin either way given his CVD risk. - Simvastatin 40mg  QHS - Will prescribe lovastatin as outpatient as this is on the [employer] list  Lipid Panel     Component Value Date/Time   CHOL 260* 01/25/2014 0232   TRIG 150* 01/25/2014 0232   HDL 41 01/25/2014 0232   CHOLHDL 6.3 01/25/2014 0232   VLDL 30 01/25/2014 0232   LDLCALC 189* 01/25/2014 0232    #Tobacco abuse - Current smoker of 1 pack per week. He is trying to quit.  - Nurse to provide smoking cessation education   #Epistaxis, resolved - Likely secondary to cocaine use.  - Continue to monitor  - Rhino rocket as needed   #DVT PPX - Heparin subq   Dispo: Disposition is deferred at this time, awaiting improvement of current medical problems.  Anticipated discharge in approximately 1-3 day(s).   The patient does not have a current PCP (No Pcp Per Patient) and does need an [facility] hospital follow-up appointment after discharge.  The patient does not have transportation limitations that hinder transportation to clinic appointments.  .Services Needed at time of discharge: Y = Yes, Blank = No PT:   OT:   RN:   Equipment:   Other:     LOS: 1 day   Vivi Barrack, MD 01/25/2014, 11:59 AM

## 2014-01-25 NOTE — Care Management Note (Addendum)
    Page 1 of 1   01/25/2014     9:53:42 AM   CARE MANAGEMENT NOTE 01/25/2014  Patient:  [name], [name]   Account Number:  [id number]  Date Initiated:  01/25/2014  Documentation initiated by:  Junius Creamer  Subjective/Objective Assessment:   adm w chf     Action/Plan:   lives w fam   Anticipated DC Date:     Anticipated DC Plan:        DC Planning Services  CM consult  MATCH Program      Choice offered to / List presented to:             Status of service:   Medicare Important Message given?   (If response is "NO", the following Medicare IM given date fields will be blank) Date Medicare IM given:   Date Additional Medicare IM given:    Discharge Disposition:    Per UR Regulation:  Reviewed for med. necessity/level of care/duration of stay  If discussed at Long Length of Stay Meetings, dates discussed:    Comments:  3/6 0952 debbie Mustafa Potts rn,bsn pt states has orange card. pt to be sched for followup appt w clinic md's. placed match letter on shadow chart for pt. gave pt 2 pharmacy discount cards.

## 2014-01-28 ENCOUNTER — Encounter: Payer: Self-pay | Admitting: *Deleted

## 2014-01-28 ENCOUNTER — Ambulatory Visit (INDEPENDENT_AMBULATORY_CARE_PROVIDER_SITE_OTHER): Payer: Self-pay | Admitting: Cardiology

## 2014-01-28 ENCOUNTER — Encounter: Payer: Self-pay | Admitting: Cardiology

## 2014-01-28 VITALS — BP 166/80 | HR 82 | Ht 67.0 in | Wt 200.0 lb

## 2014-01-28 DIAGNOSIS — F1410 Cocaine abuse, uncomplicated: Secondary | ICD-10-CM

## 2014-01-28 DIAGNOSIS — I2510 Atherosclerotic heart disease of native coronary artery without angina pectoris: Secondary | ICD-10-CM

## 2014-01-28 MED ORDER — AMLODIPINE BESYLATE 10 MG PO TABS: 10.0000 mg | ORAL_TABLET | Freq: Every day | ORAL | Status: DC

## 2014-01-28 NOTE — Assessment & Plan Note (Signed)
Patient with exertional chest pain and dyspnea. Schedule nuclear study for risk stratification.

## 2014-01-28 NOTE — Assessment & Plan Note (Signed)
Continue aspirin and statin. 

## 2014-01-28 NOTE — Assessment & Plan Note (Signed)
Patient states he has discontinued his tobacco use.

## 2014-01-28 NOTE — Assessment & Plan Note (Signed)
Patient counseled on discontinuing and states "I don't do drugs; I don't know how it got into my system".

## 2014-01-28 NOTE — Progress Notes (Signed)
HPI: FU CAD and hypertension. Cardiac cath in 2009 revealed normal LV function, 40-50 LAD, 30-40 Lcx, 50 RCA. Renal dopplers in June of 2014 were normal. Echo 6/14 45-50, grade 1 diastolic dysfunction. Patient apparently with compliance issues in past and does not wear CPAP. Patient admitted in March of 2015 with severe hypertension, dyspnea and cocaine noted on drug screen. Patient's medications were adjusted. Since then, he has some dyspnea on exertion and occasional chest pain with exertion relieved with rest. No orthopnea, PND or pedal edema.   Current Outpatient Prescriptions  Medication Sig Dispense Refill  . amLODipine (NORVASC) 5 MG tablet Take 5 mg by mouth daily.      Marland Kitchen aspirin EC 81 MG tablet Take 1 tablet (81 mg total) by mouth daily.  90 tablet  3  . benazepril (LOTENSIN) 40 MG tablet Take 40 mg by mouth daily.      . fish oil-omega-3 fatty acids 1000 MG capsule Take 1 g by mouth daily.        Marland Kitchen HYDROcodone-acetaminophen (NORCO) 5-325 MG per tablet Take 1-2 tablets by mouth every 6 (six) hours as needed for moderate pain.  20 tablet  0  . labetalol (NORMODYNE) 200 MG tablet Take 1 tablet (200 mg total) by mouth 2 (two) times daily.  60 tablet  12  . lovastatin (MEVACOR) 20 MG tablet Take 2 tablets (40 mg total) by mouth at bedtime.  60 tablet  6  . nitroGLYCERIN (NITROSTAT) 0.4 MG SL tablet Place 1 tablet (0.4 mg total) under the tongue every 5 (five) minutes as needed. For chest pain  25 tablet  3  . potassium chloride SA (K-DUR,KLOR-CON) 20 MEQ tablet Take 1 tablet (20 mEq total) by mouth 2 (two) times daily.  60 tablet  12  . tadalafil (CIALIS) 20 MG tablet Take 1 tablet (20 mg total) by mouth daily as needed for erectile dysfunction.  20 tablet  2  . triamterene-hydrochlorothiazide (MAXZIDE-25) 37.5-25 MG per tablet Take 1 tablet by mouth daily.  30 tablet  11   No current facility-administered medications for this visit.     Past Medical History  Diagnosis Date  .  CAD (coronary artery disease)     Non obstructive  . Obesity   . History of syncope 08/2008    Secondary to hypertension  . Tobacco abuse   . Hypertension, essential, benign     a. renal art [place] (6/14): no RAS  . Chest pain, unspecified   . Noncompliance   . CKD (chronic kidney disease)     History reviewed. No pertinent past surgical history.  History   Social History  . Marital Status: Divorced    Spouse Name: N/A    Number of Children: N/A  . Years of Education: N/A   Occupational History  . CNA    Social History Main Topics  . Smoking status: Current Some Day Smoker -- 0.30 packs/day for 32 years    Last Attempt to Quit: 05/11/2013  . Smokeless tobacco: Not on file     Comment: Started smoking at age 33 (48)  . Alcohol Use: No  . Drug Use: No  . Sexual Activity: Not Currently   Other Topics Concern  . 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 Remaining systems are negative.  Physical Exam: Well-developed well-nourished in no acute distress.  Skin is warm and dry.  HEENT is normal.  Neck is supple.  Chest is clear to auscultation with normal expansion.  Cardiovascular exam is regular rate and rhythm.  Abdominal exam nontender or distended. No masses palpated. Extremities show no edema. neuro grossly intact

## 2014-01-28 NOTE — Patient Instructions (Signed)
Your physician recommends that you schedule a follow-up appointment in: 3 MONTHS WITH DR CRENSHAW  INCREASE AMLODIPINE 10 MG ONCE DAILY  Your physician has requested that you have a lexiscan myoview. For further information please visit [email or web address] Please follow instruction sheet, as given.

## 2014-01-28 NOTE — Discharge Summary (Signed)
  Date: 01/28/2014  Patient name: [name]  Medical record number: [id number]  Date of birth: 20-Jun-1966   This patient has been discussed with the house staff. Please see their note for complete details. I concur with their findings and plan.  Jonah Blue, DO, FACP [facility] Medicine Residency Program 01/28/2014, 1:31 PM

## 2014-01-28 NOTE — Assessment & Plan Note (Signed)
Blood pressure elevated. Increase amlodipine to 10 mg daily and follow. We will increase labetalol in the future if blood pressure remains elevated.

## 2014-01-31 ENCOUNTER — Encounter: Payer: Self-pay | Admitting: Internal Medicine

## 2014-01-31 ENCOUNTER — Ambulatory Visit (INDEPENDENT_AMBULATORY_CARE_PROVIDER_SITE_OTHER): Payer: Self-pay | Admitting: Internal Medicine

## 2014-01-31 VITALS — BP 148/95 | HR 74 | Temp 97.0°F | Ht 67.0 in | Wt 199.0 lb

## 2014-01-31 DIAGNOSIS — I2510 Atherosclerotic heart disease of native coronary artery without angina pectoris: Secondary | ICD-10-CM

## 2014-01-31 DIAGNOSIS — E669 Obesity, unspecified: Secondary | ICD-10-CM

## 2014-01-31 DIAGNOSIS — I10 Essential (primary) hypertension: Secondary | ICD-10-CM

## 2014-01-31 DIAGNOSIS — F1410 Cocaine abuse, uncomplicated: Secondary | ICD-10-CM

## 2014-01-31 DIAGNOSIS — F17200 Nicotine dependence, unspecified, uncomplicated: Secondary | ICD-10-CM

## 2014-01-31 NOTE — Patient Instructions (Signed)
Come back in 4-6 weeks to check on your blood pressure. Be sure to get your stress test for your heart later this month. If you are having any problems with your medicines please call our clinic anytime. Exercise is a good way to help bring down your blood pressure.   Call us with any problems or questions at [phone].  Please bring your medicines with you each time you come.   Medicines may be:  Eye drops  Herbal   Vitamins  Pills  Seeing these help [place] take care of you.  Hypertension Hypertension is another name for high blood pressure. High blood pressure may mean that your heart needs to work harder to pump blood. Blood pressure consists of two numbers, which includes a higher number over a lower number (example: 110/72). HOME CARE   Make lifestyle changes as told by your doctor. This may include weight loss and exercise.  Take your blood pressure medicine every day.  Limit how much salt you use.  Stop smoking if you smoke.  Do not use drugs.  Talk to your doctor if you are using decongestants or birth control pills. These medicines might make blood pressure higher.  Females should not drink more than 1 alcoholic drink per day. Males should not drink more than 2 alcoholic drinks per day.  See your doctor as told. GET HELP RIGHT AWAY IF:   You have a blood pressure reading with a top number of 180 or higher.  You get a very bad headache.  You get blurred or changing vision.  You feel confused.  You feel weak, numb, or faint.  You get chest or belly (abdominal) pain.  You throw up (vomit).  You cannot breathe very well. MAKE SURE YOU:   Understand these instructions.  Will watch your condition.  Will get help right away if you are not doing well or get worse. Document Released: 04/26/2008 Document Revised: 01/31/2012 Document Reviewed: 04/26/2008 [facility] Patient Information 2014 [place], [place].

## 2014-01-31 NOTE — Assessment & Plan Note (Signed)
Patient trying to cut back with e-cigarettes.

## 2014-01-31 NOTE — Assessment & Plan Note (Signed)
Advised increased exercise, decreasing in the amount of salt and calories in his diet. Advised weight loss of about 5-10 pounds.

## 2014-01-31 NOTE — Assessment & Plan Note (Signed)
Taking beta blocker, aspirin, ACE inhibitor. He is scheduled for nuclear stress test later this month. He will continue to follow with Dr. [name].

## 2014-01-31 NOTE — Assessment & Plan Note (Signed)
BP still mildly elevated at today's visit however he has not yet made the changed amlodipine 10 mg daily. He is still taking amlodipine 5 mg daily. Advised that he take the regimen of amlodipine 10 mg daily, benazepril 40 mg daily, labetalol 200 mg twice a day, triamterene/HCTZ 37.5/25 mg daily. Return in 4-6 weeks for basic metabolic panel. He can discontinue potassium supplement if potassium within normal limits.

## 2014-01-31 NOTE — Assessment & Plan Note (Signed)
Patient did decline using cocaine however was cocaine positive on urine drug screen at previous hospitalization.

## 2014-01-31 NOTE — Progress Notes (Signed)
Subjective:     Patient ID: [name], male   DOB: 03-03-1966, 47 y.o.   MRN: [id number]  HPI The patient is a 48 year old male who was recently discharged from the hospital and will be new at our clinic. He has a past medical history of hypertension, chest pain, cocaine abuse, coronary artery disease, positive drug screen for cocaine, tobacco use. He comes in today doing well, having no problems since leaving the hospital. He was able to fill all of his blood pressure medications and is compliant with them. He saw the cardiologist recently who increased his amlodipine. He has not made that change currently is still taking the lower dose of amlodipine. He is doing better with decreasing his cigarette smoking and is able to make a pack last one week. He is to have stress test later this month. No episodes of chest pain, shortness of breath, headache, vision change.  Past medical history, problem list, allergies, medications, family history, social history updated at today's visit and reviewed.  Review of Systems  Constitutional: Negative for fever, chills, diaphoresis, activity change, appetite change, fatigue and unexpected weight change.  Respiratory: Negative for cough, chest tightness, shortness of breath and wheezing.   Cardiovascular: Negative for chest pain, palpitations and leg swelling.  Gastrointestinal: Negative for nausea, vomiting, abdominal pain and diarrhea.  Neurological: Negative for dizziness, tremors, seizures, syncope, facial asymmetry, speech difficulty, weakness, light-headedness, numbness and headaches.       Objective:   Physical Exam  Constitutional: He is oriented to person, place, and time. He appears well-developed and well-nourished. No distress.  HENT:  Head: Normocephalic and atraumatic.  Eyes: EOM are normal. Pupils are equal, round, and reactive to light.  Neck: Normal range of motion. No JVD present. No tracheal deviation present. No thyromegaly present.   Cardiovascular: Normal rate and regular rhythm.   No murmur heard. Pulmonary/Chest: Effort normal and breath sounds normal. No respiratory distress. He has no wheezes. He has no rales. He exhibits no tenderness.  Abdominal: Soft. Bowel sounds are normal. He exhibits no distension and no mass. There is no tenderness. There is no rebound and no guarding.  Musculoskeletal: Normal range of motion. He exhibits no edema and no tenderness.  Lymphadenopathy:    He has no cervical adenopathy.  Neurological: He is alert and oriented to person, place, and time. No cranial nerve deficit.  Skin: Skin is warm and dry. He is not diaphoretic.       Assessment:   1. Please see problem oriented charting.  2. Disposition-patient be seen back in 4-6 weeks for BP recheck. He will make the change to amlodipine 10 mg daily. At followup he will need basic metabolic panel. If potassium within normal limits may discontinue his supplement as he has two potassium sparing diuretics.

## 2014-02-07 ENCOUNTER — Encounter: Payer: Self-pay | Admitting: Cardiology

## 2014-02-07 NOTE — Progress Notes (Signed)
Case discussed with Dr. Kollar at the time of the visit.  We reviewed the resident's history and exam and pertinent patient test results.  I agree with the assessment, diagnosis, and plan of care documented in the resident's note.     

## 2014-02-11 ENCOUNTER — Ambulatory Visit (HOSPITAL_COMMUNITY): Payer: No Typology Code available for payment source | Attending: Cardiology | Admitting: Radiology

## 2014-02-11 ENCOUNTER — Encounter: Payer: Self-pay | Admitting: Cardiology

## 2014-02-11 VITALS — BP 170/95 | Ht 67.0 in | Wt 203.0 lb

## 2014-02-11 DIAGNOSIS — I2510 Atherosclerotic heart disease of native coronary artery without angina pectoris: Secondary | ICD-10-CM | POA: Insufficient documentation

## 2014-02-11 DIAGNOSIS — R0602 Shortness of breath: Secondary | ICD-10-CM

## 2014-02-11 DIAGNOSIS — R9431 Abnormal electrocardiogram [ECG] [EKG]: Secondary | ICD-10-CM

## 2014-02-11 DIAGNOSIS — R079 Chest pain, unspecified: Secondary | ICD-10-CM | POA: Insufficient documentation

## 2014-02-11 MED ORDER — REGADENOSON 0.4 MG/5ML IV SOLN
0.4000 mg | Freq: Once | INTRAVENOUS | Status: AC
Start: 2014-02-11 — End: 2014-02-11
  Administered 2014-02-11: 0.4 mg via INTRAVENOUS

## 2014-02-11 MED ORDER — TECHNETIUM TC 99M SESTAMIBI GENERIC - CARDIOLITE
11.0000 | Freq: Once | INTRAVENOUS | Status: AC | PRN
Start: 2014-02-11 — End: 2014-02-11
  Administered 2014-02-11: 11 via INTRAVENOUS

## 2014-02-11 MED ORDER — TECHNETIUM TC 99M SESTAMIBI GENERIC - CARDIOLITE
33.0000 | Freq: Once | INTRAVENOUS | Status: AC | PRN
Start: 2014-02-11 — End: 2014-02-11
  Administered 2014-02-11: 33 via INTRAVENOUS

## 2014-02-11 NOTE — Progress Notes (Signed)
[facility] SITE 3 NUCLEAR MED [address] [place], [place] [address] [phone]    Cardiology Nuclear Med Study  JERU CUSMANO is a 48 y.o. male     MRN : [id number]     DOB: 21-Jun-1966  Procedure Date: 02/11/2014  Nuclear Med Background Indication for Stress Test:  Evaluation for Ischemia and Post Hospital:01/24/14 for Chest pain and (-) troponin History:  Cath: N/O CAD, ECHO: 05/21/13 EF: 45-50% H/O Cocaine Abuse Cardiac Risk Factors: Hypertension, Lipids and Smoker  Symptoms:  Chest Pain, DOE, Near Syncope and SOB   Nuclear Pre-Procedure Caffeine/Decaff Intake:  None NPO After: 8:00pm   Lungs:  clear O2 Sat: 98% on room air. IV 0.9% NS with Angio Cath:  20g  IV Site: R Hand  IV Started by:  Cathlyn Parsons, RN  Chest Size (in):  46 Cup Size: n/a  Height: 5\' 7"  (1.702 m)  Weight:  203 lb (92.08 kg)  BMI:  Body mass index is 31.79 kg/(m^2). Tech Comments:  n/a    Nuclear Med Study 1 or 2 day study: 1 day  Stress Test Type:  Treadmill/Lexiscan  Reading MD: n/a  Order Authorizing Provider:  Ripley Fraise  Resting Radionuclide: Technetium 7m Sestamibi  Resting Radionuclide Dose: 11.0 mCi   Stress Radionuclide:  Technetium 8m Sestamibi  Stress Radionuclide Dose: 33.0 mCi           Stress Protocol Rest HR: 66 Stress HR: 105  Rest BP: 170/95 Stress BP: 209/91  Exercise Time (min): n/a METS: n/a   Predicted Max HR: 172 bpm % Max HR: 61.05 bpm Rate Pressure Product: 97026   Dose of Adenosine (mg):  n/a Dose of Lexiscan: 0.4 mg  Dose of Atropine (mg): n/a Dose of Dobutamine: n/a mcg/kg/min (at max HR)  Stress Test Technologist: Milana Na, EMT-P  Nuclear Technologist:  Domenic Polite, CNMT     Rest Procedure:  Myocardial perfusion imaging was performed at rest 45 minutes following the intravenous administration of Technetium 98m Sestamibi. Rest ECG: NSR-LVH  Stress Procedure:  The patient received IV Lexiscan 0.4 mg over 15-seconds with  concurrent low level exercise and then Technetium 25m Sestamibi was injected at 30-seconds while the patient continued walking one more minute. This patient was sob with the Lexiscan injection. Quantitative spect images were obtained after a 45-minute delay. Stress ECG: No significant change from baseline ECG  QPS Raw Data Images:  Normal; no motion artifact; normal heart/lung ratio. Stress Images:  Dereased anterolateral and inferolateral uptake Rest Images:  Decreased inferolateral uptake Subtraction (SDS):  findings consistant with mixed infarct and ischemia Transient Ischemic Dilatation (Normal <1.22):  1.02 Lung/Heart Ratio (Normal <0.45):  0.27  Quantitative Gated Spect Images QGS EDV:  196 ml QGS ESV:  131 ml  Impression Exercise Capacity:  Lexiscan with low level exercise. BP Response:  Hypertensive blood pressure response. Clinical Symptoms:  There is dyspnea. ECG Impression:  No significant ST segment change suggestive of ischemia. Comparison with Prior Nuclear Study: No previous nuclear study performed  Overall Impression:  High risk stress nuclear study LV cavity enlargement with diffuse hypokinesis worse in the inferior wall.  Findings consistant wtih multivessel disese.  Anterolateral ischemia mid and basal leve, and mixed inferolateral wall infarct with peri infarct ischemia at mid and basal level  Findings consistant with ischemic DCM and possible multivessel disease.  LV Ejection Fraction: 33%.  LV Wall Motion:  Severe decrease in EF diffuse worse in inferior wall   Charlton Haws

## 2014-02-13 ENCOUNTER — Telehealth: Payer: Self-pay | Admitting: Cardiology

## 2014-02-13 NOTE — Telephone Encounter (Signed)
New message ° °Patient is returning your call, please call back. °

## 2014-02-13 NOTE — Telephone Encounter (Signed)
Spoke with pt, aware of myoview results. Follow up scheduled with lori gerhardt np to discuss cath.

## 2014-02-15 ENCOUNTER — Ambulatory Visit: Payer: Self-pay | Admitting: Nurse Practitioner

## 2014-02-18 ENCOUNTER — Other Ambulatory Visit: Payer: Self-pay | Admitting: *Deleted

## 2014-02-18 ENCOUNTER — Ambulatory Visit (INDEPENDENT_AMBULATORY_CARE_PROVIDER_SITE_OTHER): Payer: Self-pay | Admitting: Nurse Practitioner

## 2014-02-18 ENCOUNTER — Encounter: Payer: Self-pay | Admitting: Nurse Practitioner

## 2014-02-18 ENCOUNTER — Encounter (HOSPITAL_COMMUNITY): Payer: Self-pay | Admitting: Pharmacy Technician

## 2014-02-18 VITALS — BP 160/80 | HR 76 | Ht 67.0 in | Wt 201.4 lb

## 2014-02-18 DIAGNOSIS — R9439 Abnormal result of other cardiovascular function study: Secondary | ICD-10-CM

## 2014-02-18 DIAGNOSIS — R079 Chest pain, unspecified: Secondary | ICD-10-CM

## 2014-02-18 DIAGNOSIS — I259 Chronic ischemic heart disease, unspecified: Secondary | ICD-10-CM

## 2014-02-18 LAB — APTT: aPTT: 28.4 s (ref 21.7–28.8)

## 2014-02-18 LAB — BASIC METABOLIC PANEL
BUN: 35 mg/dL — ABNORMAL HIGH (ref 6–23)
CO2: 28 mEq/L (ref 19–32)
Calcium: 9.6 mg/dL (ref 8.4–10.5)
Chloride: 103 mEq/L (ref 96–112)
Creatinine, Ser: 2.1 mg/dL — ABNORMAL HIGH (ref 0.4–1.5)
GFR: 42.6 mL/min — ABNORMAL LOW (ref >60.00)
Glucose, Bld: 97 mg/dL (ref 70–99)
Potassium: 3.3 mEq/L — ABNORMAL LOW (ref 3.5–5.1)
Sodium: 142 mEq/L (ref 135–145)

## 2014-02-18 LAB — PROTIME-INR
INR: 1.1 ratio — ABNORMAL HIGH (ref 0.8–1.0)
Prothrombin Time: 11.1 s (ref 10.2–12.4)

## 2014-02-18 NOTE — Addendum Note (Signed)
Addended by: Tonita Phoenix on: 02/18/2014 10:02 AM   Modules accepted: Orders

## 2014-02-18 NOTE — Patient Instructions (Addendum)
We need to arrange for a cardiac catheterization for Thursday  Because of your kidney function I am stopping the Maxzide and your potassium as of now.  On Wednesday, do not take any more Lotensin - just put aside until after your procedure   Tomorrow increase your Labetolol to 2 pills two times a day  You are scheduled for a cardiac catheterization on Thursday, April 2nd  At 2pm with Dr. Clifton JamesMcAlhany or associate.  [facility] Thursday, April 2nd by 7am so you can start getting IV fluids  [facility] A No food or drink after midnight on Wednesday. You may take your medications with a sip of water on the day of your procedure. You will be premedicated for possible dye allergy:  Try to stop smoking  We need to recheck labs today  Coronary Angiography Coronary angiography is an X-ray procedure used to look at the arteries in the heart. In this procedure, a dye (contrast dye) is injected through a long, hollow tube (catheter). The catheter is about the size of a piece of cooked spaghetti and is inserted through your groin, wrist, or arm. The dye is injected into each artery, and X-rays are then taken to show if there is a blockage in the arteries of your heart. [facility] KNOW ABOUT:  Any allergies you have, including allergies to shellfish or contrast dye.   All medicines you are taking, including vitamins, herbs, eye drops, creams, and over-the-counter medicines.   Previous problems you or members of your family have had with the use of anesthetics.   Any blood disorders you have.   Previous surgeries you have had.  History of kidney problems or failure.   Other medical conditions you have. RISKS AND COMPLICATIONS  Generally, coronary angiography is a safe procedure. However, as with any procedure, complications can occur. Possible complications include:  Allergic reaction to the dye.  Bleeding from the  access site or other locations.  Kidney injury, especially in people with impaired kidney function.  Stroke (rare).  Heart attack (rare). BEFORE THE PROCEDURE   Do not eat or drink anything after midnight the night before the procedure, or as directed by your health care provider.   Ask your health care provider if it is okay to take any needed medicines with a sip of water.  PROCEDURE  You may be given a medicine to help you relax (sedative) before the procedure. This medicine is given through an intravenous (IV) access tube that is inserted into one of your veins.   The area where the catheter will be inserted is washed and shaved. This is usually done in the groin but may be done in the fold of your arm (near your elbow) or in the wrist.   A medicine will be given to numb the area where the catheter will be inserted (local anesthetic).   The health care provider will insert the catheter into an artery. The catheter is guided by using a special type of X-ray (fluoroscopy) of the blood vessel being examined.   A special dye is then injected into the catheter, and X-rays are taken. The dye helps to show where any narrowing or blockages are located in the heart arteries.  AFTER THE PROCEDURE   If the procedure is done through the leg, you will be kept in bed lying flat for several hours. You will be instructed to not bend or cross  your legs.  The insertion site will be checked frequently.   The pulse in your feet or wrist will be checked frequently.   Additional blood tests, X-rays, and an electrocardiogram may be done.   You may need to stay in the hospital overnight for observation.  Document Released: 05/15/2003 Document Revised: 07/11/2013 Document Reviewed: 04/02/2013 [facility] Patient Information 2014 [place], [place].

## 2014-02-18 NOTE — Progress Notes (Signed)
[name] Date of Birth: Feb 18, 1966 Medical Record #[id number]  History of Present Illness: Mr. Duffin is seen back today for a pre cath visit. Seen for Dr. [name]. He is a 48 year old male with known CAD, grade 1 diastolic dysfunction, noncompliance, OSA and positive drug screen for cocaine.  Seen here earlier this month after a hospitalization with severe HTN and dyspnea. Drug screen positive for cocaine. Referred for Myoview which is noted below.  Comes back today. Here alone. Says he is doing well. No chest pain. Not short of breath. Tells me he is not doing drugs - says that was a mistake earlier this month. Still smoking. Says he is taking his medicines. Reports allergy to shrimp but thinks he has had xray dye in the past.   Current Outpatient Prescriptions  Medication Sig Dispense Refill  . amLODipine (NORVASC) 10 MG tablet Take 1 tablet (10 mg total) by mouth daily.  30 tablet  12  . aspirin EC 81 MG tablet Take 1 tablet (81 mg total) by mouth daily.  90 tablet  3  . benazepril (LOTENSIN) 40 MG tablet Take 40 mg by mouth daily.      . fish oil-omega-3 fatty acids 1000 MG capsule Take 1 g by mouth daily.        Marland Kitchen labetalol (NORMODYNE) 200 MG tablet Take 1 tablet (200 mg total) by mouth 2 (two) times daily.  60 tablet  12  . lovastatin (MEVACOR) 20 MG tablet Take 2 tablets (40 mg total) by mouth at bedtime.  60 tablet  6  . nitroGLYCERIN (NITROSTAT) 0.4 MG SL tablet Place 1 tablet (0.4 mg total) under the tongue every 5 (five) minutes as needed. For chest pain  25 tablet  3  . potassium chloride SA (K-DUR,KLOR-CON) 20 MEQ tablet Take 20 mEq by mouth daily.      Marland Kitchen triamterene-hydrochlorothiazide (MAXZIDE-25) 37.5-25 MG per tablet Take 1 tablet by mouth daily.  30 tablet  11   No current facility-administered medications for this visit.    Allergies  Allergen Reactions  . Penicillins Other (See Comments)    Patient states that it makes his throat itch really bad, and feels  like it is closing up.     Past Medical History  Diagnosis Date  . CAD (coronary artery disease)     Non obstructive  . Obesity   . History of syncope 08/2008    Secondary to hypertension  . Tobacco abuse   . Hypertension, essential, benign     a. renal art [place] (6/14): no RAS  . Chest pain, unspecified   . Noncompliance   . CKD (chronic kidney disease)     History reviewed. No pertinent past surgical history.  History  Smoking status  . Current Some Day Smoker -- 0.10 packs/day for 32 years  . Types: Cigarettes  Smokeless tobacco  . Not on file    Comment: Started smoking at age 37 (12).  Cutting back    History  Alcohol Use No    Family History  Problem Relation Age of Onset  . Lupus Mother   . Heart disease Maternal Grandfather   . Coronary artery disease Neg Hx     Review of Systems: The review of systems is per the HPI.  All other systems were reviewed and are negative.  Physical Exam: BP 160/80  Pulse 76  Ht 5\' 7"  (1.702 m)  Wt 201 lb 6.4 oz (91.354 kg)  BMI 31.54 kg/m2 BP  is 130/90 by me. Patient is alert and in no acute distress. Skin is warm and dry. Color is normal.  HEENT is unremarkable. Normocephalic/atraumatic. PERRL. Sclera are nonicteric. Neck is supple. No masses. No JVD. Lungs are clear. Cardiac exam shows a regular rate and rhythm. Abdomen is soft. Extremities are without edema. Gait and ROM are intact. No gross neurologic deficits noted.  LABORATORY DATA: PENDING  Lab Results  Component Value Date   WBC 10.2 01/25/2014   HGB 14.5 01/25/2014   HCT 41.2 01/25/2014   PLT 311 01/25/2014   GLUCOSE 97 01/25/2014   CHOL 260* 01/25/2014   TRIG 150* 01/25/2014   HDL 41 01/25/2014   LDLDIRECT 165.5 08/14/2013   LDLCALC 189* 01/25/2014   ALT 12 08/14/2013   AST 16 08/14/2013   NA 138 01/25/2014   K 3.5* 01/25/2014   CL 94* 01/25/2014   CREATININE 2.08* 01/25/2014   BUN 22 01/25/2014   CO2 30 01/25/2014   INR 0.9 11/11/2008   HGBA1C 5.4 01/24/2014    Impression    Exercise Capacity: Lexiscan with low level exercise.  BP Response: Hypertensive blood pressure response.  Clinical Symptoms: There is dyspnea.  ECG Impression: No significant ST segment change suggestive of ischemia.  Comparison with Prior Nuclear Study: No previous nuclear study performed  Overall Impression: High risk stress nuclear study LV cavity enlargement with diffuse hypokinesis worse in the inferior wall. Findings consistant wtih multivessel disese. Anterolateral ischemia mid and basal leve, and mixed inferolateral wall infarct with peri infarct ischemia at mid and basal level Findings consistant with ischemic DCM and possible multivessel disease.  LV Ejection Fraction: 33%. LV Wall Motion: Severe decrease in EF diffuse worse in inferior wall  Charlton HawsPeter Nishan   ANGIOGRAPHIC DATA:  1. The left main tapers distally with about 20% narrowing.  2. The proximal LAD clearly has some mild concentric plaquing. This  would measure about 40-50% in luminal reduction. The vessel opens  up and then is calcified in its midportion. Beyond the area of  calcification is a second area of narrowing. The second area of  narrowing measures about 40-50% in luminal reduction. This was  shown to the patient in multiple views. The distal LAD wraps the  apex and was without critical narrowing.  3. The circumflex provides a proximal marginal and a second marginal  branch. There is a third marginal branch that is moderate in size.  The AV circumflex after this has probably 30 and 40% narrowing in a  segmental area of plaquing distally.  4. The right coronary artery is somewhat smaller caliber vessel. Just  after the takeoff of the SA nodal artery, there is about 50% area  of focal narrowing. There is 40% narrowing in the midvessel and 20-  30% narrowing more distal to this.  5. Ventriculography in the RAO projection reveals vigorous global  systolic function. No definite wall motion abnormalities are   identified.  CONCLUSIONS:  1. Preserved LV function.  2. Scattered coronary artery disease.  RECOMMENDATIONS:  1. We will do serial enzymes.  2. We will highly recommend that the patient attempt to stop smoking  with well-recognized programs, including setting a date and 2-week  followup.  3. We may add some nitrates to his overall regimen. At the present  time, he will be on a medical regimen.  4. We will add aspirin.  5. He will require an exercise tolerance test when stabilized.  Arturo Mortonhomas D. Riley KillStuckey, MD, [facility]  Electronically Signed  Assessment / Plan: 1. Abnormal Myoview - high risk study - needs cath - will need to be premedicated due to allergy to shrimp. Will need hydration as well. Possible staged procedure if PCI needed. Have discussed with Dr. Katrinka Blazing who is in agreement and has advised stopping Maxzide and potassium as of now, stopping ACE as of Wednesday and increasing the Labetolol to 2 pills BID.  2. CKD - recheck labs today. Plan to hydrate. Medicines have been adjusted as noted above.   3. HTN - recheck of BP is better by me.   4. HLD - on statin  5. Substance abuse - cautioned to stop.   Patient is agreeable to this plan and will call if any problems develop in the interim.   Rosalio Macadamia, RN, ANP-C [facility] [address] [place], [place]  [address] [phone]

## 2014-02-19 ENCOUNTER — Telehealth: Payer: Self-pay | Admitting: Nurse Practitioner

## 2014-02-19 ENCOUNTER — Other Ambulatory Visit: Payer: Self-pay | Admitting: *Deleted

## 2014-02-19 ENCOUNTER — Other Ambulatory Visit: Payer: Self-pay | Admitting: Nurse Practitioner

## 2014-02-19 DIAGNOSIS — E876 Hypokalemia: Secondary | ICD-10-CM

## 2014-02-19 LAB — CBC WITH DIFFERENTIAL/PLATELET
Basophils Absolute: 0 10*3/uL (ref 0.0–0.1)
Basophils Relative: 0.4 % (ref 0.0–3.0)
Eosinophils Absolute: 0.1 10*3/uL (ref 0.0–0.7)
Eosinophils Relative: 2.2 % (ref 0.0–5.0)
HCT: 39.8 % (ref 39.0–52.0)
Hemoglobin: 13.5 g/dL (ref 13.0–17.0)
Lymphocytes Relative: 26.4 % (ref 12.0–46.0)
Lymphs Abs: 1.5 10*3/uL (ref 0.7–4.0)
MCHC: 33.8 g/dL (ref 30.0–36.0)
MCV: 89.6 fl (ref 78.0–100.0)
Monocytes Absolute: 0.6 10*3/uL (ref 0.1–1.0)
Monocytes Relative: 11.1 % (ref 3.0–12.0)
Neutro Abs: 3.3 10*3/uL (ref 1.4–7.7)
Neutrophils Relative %: 59.9 % (ref 43.0–77.0)
Platelets: 301 10*3/uL (ref 150.0–400.0)
RBC: 4.44 Mil/uL (ref 4.22–5.81)
RDW: 14 % (ref 11.5–14.6)
WBC: 5.5 10*3/uL (ref 4.5–10.5)

## 2014-02-19 MED ORDER — POTASSIUM CHLORIDE CRYS ER 20 MEQ PO TBCR: 20.0000 meq | EXTENDED_RELEASE_TABLET | Freq: Two times a day (BID) | ORAL | Status: DC

## 2014-02-19 NOTE — Telephone Encounter (Signed)
Patient is returning your call. Please call back.  °

## 2014-02-21 ENCOUNTER — Encounter (HOSPITAL_COMMUNITY)
Admission: RE | Disposition: A | Discharge status: Home / Self Care | Payer: Self-pay | Source: Ambulatory Visit | Attending: Cardiovascular Disease

## 2014-02-21 ENCOUNTER — Ambulatory Visit (HOSPITAL_COMMUNITY)
Admission: RE | Admit: 2014-02-21 | Discharge: 2014-02-21 | Disposition: A | Discharge status: Home / Self Care | Payer: No Typology Code available for payment source | Source: Ambulatory Visit | Attending: Cardiovascular Disease | Admitting: Cardiovascular Disease

## 2014-02-21 DIAGNOSIS — I2510 Atherosclerotic heart disease of native coronary artery without angina pectoris: Secondary | ICD-10-CM

## 2014-02-21 DIAGNOSIS — G4733 Obstructive sleep apnea (adult) (pediatric): Secondary | ICD-10-CM | POA: Insufficient documentation

## 2014-02-21 DIAGNOSIS — R0989 Other specified symptoms and signs involving the circulatory and respiratory systems: Secondary | ICD-10-CM | POA: Insufficient documentation

## 2014-02-21 DIAGNOSIS — E785 Hyperlipidemia, unspecified: Secondary | ICD-10-CM | POA: Insufficient documentation

## 2014-02-21 DIAGNOSIS — F17200 Nicotine dependence, unspecified, uncomplicated: Secondary | ICD-10-CM | POA: Insufficient documentation

## 2014-02-21 DIAGNOSIS — Z9119 Patient's noncompliance with other medical treatment and regimen: Secondary | ICD-10-CM | POA: Insufficient documentation

## 2014-02-21 DIAGNOSIS — I519 Heart disease, unspecified: Secondary | ICD-10-CM | POA: Insufficient documentation

## 2014-02-21 DIAGNOSIS — I129 Hypertensive chronic kidney disease with stage 1 through stage 4 chronic kidney disease, or unspecified chronic kidney disease: Secondary | ICD-10-CM | POA: Insufficient documentation

## 2014-02-21 DIAGNOSIS — R0609 Other forms of dyspnea: Secondary | ICD-10-CM | POA: Insufficient documentation

## 2014-02-21 DIAGNOSIS — R9439 Abnormal result of other cardiovascular function study: Secondary | ICD-10-CM

## 2014-02-21 DIAGNOSIS — Z91199 Patient's noncompliance with other medical treatment and regimen due to unspecified reason: Secondary | ICD-10-CM | POA: Insufficient documentation

## 2014-02-21 DIAGNOSIS — I428 Other cardiomyopathies: Secondary | ICD-10-CM | POA: Insufficient documentation

## 2014-02-21 DIAGNOSIS — Z7982 Long term (current) use of aspirin: Secondary | ICD-10-CM | POA: Insufficient documentation

## 2014-02-21 DIAGNOSIS — I259 Chronic ischemic heart disease, unspecified: Secondary | ICD-10-CM

## 2014-02-21 DIAGNOSIS — R079 Chest pain, unspecified: Secondary | ICD-10-CM

## 2014-02-21 DIAGNOSIS — N189 Chronic kidney disease, unspecified: Secondary | ICD-10-CM | POA: Insufficient documentation

## 2014-02-21 DIAGNOSIS — F1410 Cocaine abuse, uncomplicated: Secondary | ICD-10-CM | POA: Insufficient documentation

## 2014-02-21 HISTORY — PX: LEFT HEART CATHETERIZATION WITH CORONARY ANGIOGRAM: SHX5451

## 2014-02-21 LAB — BASIC METABOLIC PANEL
BUN: 26 mg/dL — ABNORMAL HIGH (ref 6–23)
CO2: 24 mEq/L (ref 19–32)
Calcium: 8.8 mg/dL (ref 8.4–10.5)
Chloride: 102 mEq/L (ref 96–112)
Creatinine, Ser: 1.62 mg/dL — ABNORMAL HIGH (ref 0.50–1.35)
GFR calc Af Amer: 56 mL/min — ABNORMAL LOW (ref >90)
GFR calc non Af Amer: 49 mL/min — ABNORMAL LOW (ref >90)
Glucose, Bld: 112 mg/dL — ABNORMAL HIGH (ref 70–99)
Potassium: 3.2 mEq/L — ABNORMAL LOW (ref 3.7–5.3)
Sodium: 143 mEq/L (ref 137–147)

## 2014-02-21 SURGERY — LEFT HEART CATHETERIZATION WITH CORONARY ANGIOGRAM
Anesthesia: LOCAL

## 2014-02-21 MED ORDER — NITROGLYCERIN 0.2 MG/ML ON CALL CATH LAB
INTRAVENOUS | Status: AC
  Filled 2014-02-21: qty 1

## 2014-02-21 MED ORDER — SODIUM CHLORIDE 0.9 % IV SOLN: INTRAVENOUS | Status: AC

## 2014-02-21 MED ORDER — SODIUM CHLORIDE 0.9 % IJ SOLN
3.0000 mL | INTRAMUSCULAR | Status: DC | PRN
  Administered 2014-02-21 (×2): 3 mL via INTRAVENOUS

## 2014-02-21 MED ORDER — DIPHENHYDRAMINE HCL 50 MG/ML IJ SOLN
25.0000 mg | INTRAMUSCULAR | Status: AC
  Administered 2014-02-21: 25 mg via INTRAVENOUS
  Filled 2014-02-21: qty 1

## 2014-02-21 MED ORDER — VERAPAMIL HCL 2.5 MG/ML IV SOLN
INTRAVENOUS | Status: AC
  Filled 2014-02-21: qty 2

## 2014-02-21 MED ORDER — ASPIRIN 81 MG PO CHEW
81.0000 mg | CHEWABLE_TABLET | ORAL | Status: AC
  Administered 2014-02-21: 81 mg via ORAL
  Filled 2014-02-21: qty 1

## 2014-02-21 MED ORDER — MIDAZOLAM HCL 2 MG/2ML IJ SOLN
INTRAMUSCULAR | Status: AC
  Filled 2014-02-21: qty 2

## 2014-02-21 MED ORDER — METHYLPREDNISOLONE SODIUM SUCC 125 MG IJ SOLR
125.0000 mg | INTRAMUSCULAR | Status: AC
  Administered 2014-02-21: 125 mg via INTRAVENOUS
  Filled 2014-02-21: qty 2

## 2014-02-21 MED ORDER — LIDOCAINE HCL (PF) 1 % IJ SOLN
INTRAMUSCULAR | Status: AC
  Filled 2014-02-21: qty 30

## 2014-02-21 MED ORDER — POTASSIUM CHLORIDE CRYS ER 20 MEQ PO TBCR
40.0000 meq | EXTENDED_RELEASE_TABLET | Freq: Once | ORAL | Status: AC
  Administered 2014-02-21: 40 meq via ORAL
  Filled 2014-02-21: qty 2

## 2014-02-21 MED ORDER — SODIUM CHLORIDE 0.9 % IV SOLN: 250.0000 mL | INTRAVENOUS | Status: DC | PRN

## 2014-02-21 MED ORDER — FAMOTIDINE 20 MG PO TABS
20.0000 mg | ORAL_TABLET | ORAL | Status: AC
  Administered 2014-02-21: 20 mg via ORAL
  Filled 2014-02-21: qty 1

## 2014-02-21 MED ORDER — SODIUM CHLORIDE 0.9 % IV SOLN
INTRAVENOUS | Status: DC
  Administered 2014-02-21: 08:00:00 via INTRAVENOUS

## 2014-02-21 MED ORDER — FENTANYL CITRATE 0.05 MG/ML IJ SOLN
INTRAMUSCULAR | Status: AC
  Filled 2014-02-21: qty 2

## 2014-02-21 MED ORDER — HEPARIN SODIUM (PORCINE) 1000 UNIT/ML IJ SOLN
INTRAMUSCULAR | Status: AC
  Filled 2014-02-21: qty 1

## 2014-02-21 MED ORDER — HEPARIN (PORCINE) IN NACL 2-0.9 UNIT/ML-% IJ SOLN
INTRAMUSCULAR | Status: AC
  Filled 2014-02-21: qty 1000

## 2014-02-21 MED ORDER — NITROGLYCERIN IN D5W 200-5 MCG/ML-% IV SOLN
INTRAVENOUS | Status: AC
  Filled 2014-02-21: qty 250

## 2014-02-21 MED ORDER — SODIUM CHLORIDE 0.9 % IJ SOLN: 3.0000 mL | Freq: Two times a day (BID) | INTRAMUSCULAR | Status: DC

## 2014-02-21 NOTE — Discharge Instructions (Signed)
Angiography, Care After °Refer to this sheet in the next few weeks. These instructions provide you with information on caring for yourself after your procedure. Your health care provider may also give you more specific instructions. Your treatment has been planned according to current medical practices, but problems sometimes occur. Call your health care provider if you have any problems or questions after your procedure.  °WHAT TO EXPECT AFTER THE PROCEDURE °After your procedure, it is typical to have the following sensations: °· Minor discomfort or tenderness and a small bump at the catheter insertion site. The bump should usually decrease in size and tenderness within 1 to 2 weeks. °· Any bruising will usually fade within 2 to 4 weeks. °HOME CARE INSTRUCTIONS  °· You may need to keep taking blood thinners if they were prescribed for you. Only take over-the-counter or prescription medicines for pain, fever, or discomfort as directed by your health care provider. °· Do not apply powder or lotion to the site. °· Do not sit in a bathtub, swimming pool, or whirlpool for 5 to 7 days. °· You may shower 24 hours after the procedure. Remove the bandage (dressing) and gently wash the site with plain soap and water. Gently pat the site dry. °· Inspect the site at least twice daily. °· Limit your activity for the first 48 hours. Do not bend, squat, or lift anything over 20 lb (9 kg) or as directed by your health care provider. °· Do not drive home if you are discharged the day of the procedure. Have someone else drive you. Follow instructions about when you can drive or return to work. °SEEK MEDICAL CARE IF: °· You get lightheaded when standing up. °· You have drainage (other than a small amount of blood on the dressing). °· You have chills. °· You have a fever. °· You have redness, warmth, swelling, or pain at the insertion site. °SEEK IMMEDIATE MEDICAL CARE IF:  °· You develop chest pain or shortness of breath, feel faint,  or pass out. °· You have bleeding, swelling larger than a walnut, or drainage from the catheter insertion site. °· You develop pain, discoloration, coldness, or severe bruising in the leg or arm that held the catheter. °· You have heavy bleeding from the site. If this happens, hold pressure on the site and call 911. °MAKE SURE YOU: °· Understand these instructions. °· Will watch your condition. °· Will get help right away if you are not doing well or get worse. °Document Released: 05/27/2005 Document Revised: 07/11/2013 Document Reviewed: 04/02/2013 °[employer]® Patient Information ©2014 [employer]. ° °Radial Site Care °Refer to this sheet in the next few weeks. These instructions provide you with information on caring for yourself after your procedure. Your caregiver may also give you more specific instructions. Your treatment has been planned according to current medical practices, but problems sometimes occur. Call your caregiver if you have any problems or questions after your procedure. °HOME CARE INSTRUCTIONS °· You may shower the day after the procedure. Remove the bandage (dressing) and gently wash the site with plain soap and water. Gently pat the site dry. °· Do not apply powder or lotion to the site. °· Do not submerge the affected site in water for 3 to 5 days. °· Inspect the site at least twice daily. °· Do not flex or bend the affected arm for 24 hours. °· No lifting over 5 pounds (2.3 kg) for 5 days after your procedure. °· Do not drive home if you are discharged   the same day of the procedure. Have someone else drive you. °· You may drive 24 hours after the procedure unless otherwise instructed by your caregiver. °· Do not operate machinery or power tools for 24 hours. °· A responsible adult should be with you for the first 24 hours after you arrive home. °What to expect: °· Any bruising will usually fade within 1 to 2 weeks. °· Blood that collects in the tissue (hematoma) may be painful to the  touch. It should usually decrease in size and tenderness within 1 to 2 weeks. °SEEK IMMEDIATE MEDICAL CARE IF: °· You have unusual pain at the radial site. °· You have redness, warmth, swelling, or pain at the radial site. °· You have drainage (other than a small amount of blood on the dressing). °· You have chills. °· You have a fever or persistent symptoms for more than 72 hours. °· You have a fever and your symptoms suddenly get worse. °· Your arm becomes pale, cool, tingly, or numb. °· You have heavy bleeding from the site. Hold pressure on the site. °Document Released: 12/11/2010 Document Revised: 01/31/2012 Document Reviewed: 12/11/2010 °[employer]® Patient Information ©2014 [employer]. ° ° °

## 2014-02-21 NOTE — H&P (View-Only) (Signed)
[name] Date of Birth: Feb 18, 1966 Medical Record #[id number]  History of Present Illness: Mr. Duffin is seen back today for a pre cath visit. Seen for Dr. [name]. He is a 48 year old male with known CAD, grade 1 diastolic dysfunction, noncompliance, OSA and positive drug screen for cocaine.  Seen here earlier this month after a hospitalization with severe HTN and dyspnea. Drug screen positive for cocaine. Referred for Myoview which is noted below.  Comes back today. Here alone. Says he is doing well. No chest pain. Not short of breath. Tells me he is not doing drugs - says that was a mistake earlier this month. Still smoking. Says he is taking his medicines. Reports allergy to shrimp but thinks he has had xray dye in the past.   Current Outpatient Prescriptions  Medication Sig Dispense Refill  . amLODipine (NORVASC) 10 MG tablet Take 1 tablet (10 mg total) by mouth daily.  30 tablet  12  . aspirin EC 81 MG tablet Take 1 tablet (81 mg total) by mouth daily.  90 tablet  3  . benazepril (LOTENSIN) 40 MG tablet Take 40 mg by mouth daily.      . fish oil-omega-3 fatty acids 1000 MG capsule Take 1 g by mouth daily.        Marland Kitchen labetalol (NORMODYNE) 200 MG tablet Take 1 tablet (200 mg total) by mouth 2 (two) times daily.  60 tablet  12  . lovastatin (MEVACOR) 20 MG tablet Take 2 tablets (40 mg total) by mouth at bedtime.  60 tablet  6  . nitroGLYCERIN (NITROSTAT) 0.4 MG SL tablet Place 1 tablet (0.4 mg total) under the tongue every 5 (five) minutes as needed. For chest pain  25 tablet  3  . potassium chloride SA (K-DUR,KLOR-CON) 20 MEQ tablet Take 20 mEq by mouth daily.      Marland Kitchen triamterene-hydrochlorothiazide (MAXZIDE-25) 37.5-25 MG per tablet Take 1 tablet by mouth daily.  30 tablet  11   No current facility-administered medications for this visit.    Allergies  Allergen Reactions  . Penicillins Other (See Comments)    Patient states that it makes his throat itch really bad, and feels  like it is closing up.     Past Medical History  Diagnosis Date  . CAD (coronary artery disease)     Non obstructive  . Obesity   . History of syncope 08/2008    Secondary to hypertension  . Tobacco abuse   . Hypertension, essential, benign     a. renal art [place] (6/14): no RAS  . Chest pain, unspecified   . Noncompliance   . CKD (chronic kidney disease)     History reviewed. No pertinent past surgical history.  History  Smoking status  . Current Some Day Smoker -- 0.10 packs/day for 32 years  . Types: Cigarettes  Smokeless tobacco  . Not on file    Comment: Started smoking at age 37 (12).  Cutting back    History  Alcohol Use No    Family History  Problem Relation Age of Onset  . Lupus Mother   . Heart disease Maternal Grandfather   . Coronary artery disease Neg Hx     Review of Systems: The review of systems is per the HPI.  All other systems were reviewed and are negative.  Physical Exam: BP 160/80  Pulse 76  Ht 5\' 7"  (1.702 m)  Wt 201 lb 6.4 oz (91.354 kg)  BMI 31.54 kg/m2 BP  is 130/90 by me. Patient is alert and in no acute distress. Skin is warm and dry. Color is normal.  HEENT is unremarkable. Normocephalic/atraumatic. PERRL. Sclera are nonicteric. Neck is supple. No masses. No JVD. Lungs are clear. Cardiac exam shows a regular rate and rhythm. Abdomen is soft. Extremities are without edema. Gait and ROM are intact. No gross neurologic deficits noted.  LABORATORY DATA: PENDING  Lab Results  Component Value Date   WBC 10.2 01/25/2014   HGB 14.5 01/25/2014   HCT 41.2 01/25/2014   PLT 311 01/25/2014   GLUCOSE 97 01/25/2014   CHOL 260* 01/25/2014   TRIG 150* 01/25/2014   HDL 41 01/25/2014   LDLDIRECT 165.5 08/14/2013   LDLCALC 189* 01/25/2014   ALT 12 08/14/2013   AST 16 08/14/2013   NA 138 01/25/2014   K 3.5* 01/25/2014   CL 94* 01/25/2014   CREATININE 2.08* 01/25/2014   BUN 22 01/25/2014   CO2 30 01/25/2014   INR 0.9 11/11/2008   HGBA1C 5.4 01/24/2014    Impression    Exercise Capacity: Lexiscan with low level exercise.  BP Response: Hypertensive blood pressure response.  Clinical Symptoms: There is dyspnea.  ECG Impression: No significant ST segment change suggestive of ischemia.  Comparison with Prior Nuclear Study: No previous nuclear study performed  Overall Impression: High risk stress nuclear study LV cavity enlargement with diffuse hypokinesis worse in the inferior wall. Findings consistant wtih multivessel disese. Anterolateral ischemia mid and basal leve, and mixed inferolateral wall infarct with peri infarct ischemia at mid and basal level Findings consistant with ischemic DCM and possible multivessel disease.  LV Ejection Fraction: 33%. LV Wall Motion: Severe decrease in EF diffuse worse in inferior wall  Charlton HawsPeter Nishan   ANGIOGRAPHIC DATA:  1. The left main tapers distally with about 20% narrowing.  2. The proximal LAD clearly has some mild concentric plaquing. This  would measure about 40-50% in luminal reduction. The vessel opens  up and then is calcified in its midportion. Beyond the area of  calcification is a second area of narrowing. The second area of  narrowing measures about 40-50% in luminal reduction. This was  shown to the patient in multiple views. The distal LAD wraps the  apex and was without critical narrowing.  3. The circumflex provides a proximal marginal and a second marginal  branch. There is a third marginal branch that is moderate in size.  The AV circumflex after this has probably 30 and 40% narrowing in a  segmental area of plaquing distally.  4. The right coronary artery is somewhat smaller caliber vessel. Just  after the takeoff of the SA nodal artery, there is about 50% area  of focal narrowing. There is 40% narrowing in the midvessel and 20-  30% narrowing more distal to this.  5. Ventriculography in the RAO projection reveals vigorous global  systolic function. No definite wall motion abnormalities are   identified.  CONCLUSIONS:  1. Preserved LV function.  2. Scattered coronary artery disease.  RECOMMENDATIONS:  1. We will do serial enzymes.  2. We will highly recommend that the patient attempt to stop smoking  with well-recognized programs, including setting a date and 2-week  followup.  3. We may add some nitrates to his overall regimen. At the present  time, he will be on a medical regimen.  4. We will add aspirin.  5. He will require an exercise tolerance test when stabilized.  Arturo Mortonhomas D. Riley KillStuckey, MD, [facility]  Electronically Signed  Assessment / Plan: 1. Abnormal Myoview - high risk study - needs cath - will need to be premedicated due to allergy to shrimp. Will need hydration as well. Possible staged procedure if PCI needed. Have discussed with Dr. Katrinka Blazing who is in agreement and has advised stopping Maxzide and potassium as of now, stopping ACE as of Wednesday and increasing the Labetolol to 2 pills BID.  2. CKD - recheck labs today. Plan to hydrate. Medicines have been adjusted as noted above.   3. HTN - recheck of BP is better by me.   4. HLD - on statin  5. Substance abuse - cautioned to stop.   Patient is agreeable to this plan and will call if any problems develop in the interim.   Rosalio Macadamia, RN, ANP-C [facility] [address] [place], [place]  [address] [phone]

## 2014-02-21 NOTE — Interval H&P Note (Signed)
History and Physical Interval Note:  02/21/2014 10:01 AM  [name]  has presented today for cardiac cath with the diagnosis of chest pain, abnormal high risk stress test.  The various methods of treatment have been discussed with the patient and family. After consideration of risks, benefits and other options for treatment, the patient has consented to  Procedure(s): LEFT HEART CATHETERIZATION WITH CORONARY ANGIOGRAM (N/A) as a surgical intervention .  The patient's history has been reviewed, patient examined, no change in status, stable for surgery.  I have reviewed the patient's chart and labs.  Questions were answered to the patient's satisfaction.    Cath Lab Visit (complete for each Cath Lab visit)  Clinical Evaluation Leading to the Procedure:   ACS: no  Non-ACS:    Anginal Classification: CCS I  Anti-ischemic medical therapy: Maximal Therapy (2 or more classes of medications)  Non-Invasive Test Results: High-risk stress test findings: cardiac mortality >3%/year  Prior CABG: No previous CABG        Autie Vasudevan

## 2014-02-21 NOTE — CV Procedure (Signed)
Cardiac Catheterization Operative Report  [name] [id number] 4/2/201511:29 AM Pcp Not In System  Procedure Performed:  1. Left Heart Catheterization 2. Selective Coronary Angiography  Operator: Verne Carrow, MD  Arterial access site:  Right radial artery and right femoral artery  Indication:  48 yo male with history of CAD, cardiomyopathy, severe HTN, cocaine abuse with recent mild chest pain with exertion, stress test with LvEF=33%, possible ischemia.                                     Procedure Details: The risks, benefits, complications, treatment options, and expected outcomes were discussed with the patient. The patient and/or family concurred with the proposed plan, giving informed consent. The patient was brought to the cath lab after IV hydration was begun and oral premedication was given. The patient was further sedated with Versed and fentanyl The right wrist was assessed with an Allens test which was positive. The right wrist was prepped and draped in a sterile fashion. 1% lidocaine was used for local anesthesia. Using the modified Seldinger access technique, a 5 French sheath was placed in the right radial artery. 3 mg Verapamil was given through the sheath. I was able to easily pass the wire into the ascending aorta but I could not pass the catheter easily into the aortic root. It was felt that there was intense vasospasm in the radial artery. At this point I gave intraarterial NTG but the catheter would not release. I then obtained access in the right femoral artery with a 5 French sheath. Standard diagnostic catheters were used to perform selective coronary angiography. LV pressures were measured. At this time, we gave a combination of lidocaine/NTG/verapamil through the catheter in the right radial artery and the catheter was then easily removed from the sheath in the right radial artery.  The sheath was removed from the right radial artery and a Terumo  hemostasis band was applied at the arteriotomy site on the right wrist. A Mynx femoral artery closure device was placed in the right femoral artery.    There were no immediate complications. The patient was taken to the recovery area in stable condition.   Hemodynamic Findings: Central aortic pressure: 172/110 Left ventricular pressure: 170/28/44  Angiographic Findings:  Left main: 20% distal stenosis.   Left Anterior Descending Artery: Large caliber vessel that courses to the apex. Diffuse 40% proximal stenosis. The mid vessel has diffuse 50% stenosis. The distal vessel has mild plaque. The diagonal branch is moderate in caliber with diffuse 30% stenosis.   Circumflex Artery: Large caliber vessel with three obtuse marginal branches. The first OM branch is small to moderate in caliber with 100% occlusion in the mid segment and fills distally from left to left collaterasl. The distal segment of this first OM branch appears to have diffuse disease. The second OM branch is small in caliber with ostial 60% stenosis. The third OM branch is large in caliber with ostial 20% stenosis. The distal AV groove Circumflex has diffuse 70% stenosis and becomes small in caliber.   Right Coronary Artery: Large caliber dominant vessel with 60% proximal stenosis and diffuse 30% stenosis in the mid and distal vessel. The PDA becomes small caliber and has diffuse mild plaque.   Left Ventricular Angiogram: Deferred.   Impression: 1. Diffuse moderate three vessel CAD  2. 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 Uncontrolled  HTN  Recommendations: Aggressive risk factor reduction including better BP control. Continue present cardiac meds. Close f/u with Norma FredricksonLori Gerhardt, NP.        Complications:  None. The patient tolerated the procedure well.

## 2014-03-07 ENCOUNTER — Ambulatory Visit: Payer: No Typology Code available for payment source | Admitting: Internal Medicine

## 2014-03-13 ENCOUNTER — Ambulatory Visit: Payer: Self-pay | Admitting: Internal Medicine

## 2014-05-07 ENCOUNTER — Ambulatory Visit: Payer: Self-pay | Admitting: Cardiology

## 2014-07-17 ENCOUNTER — Encounter: Payer: Self-pay | Admitting: Cardiology

## 2014-07-17 NOTE — Progress Notes (Signed)
HPI: FU CAD and hypertension. Renal dopplers in June of 2014 were normal. Echo 6/14 45-50, grade 1 diastolic dysfunction. Patient apparently with compliance issues in past and does not wear CPAP. Patient admitted in March of 2015 with severe hypertension, dyspnea and cocaine noted on drug screen. Patient's medications were adjusted. Nuclear study 3/15 with EF 33, anterolateral and inferolateral ischemia. Cath 4/15 showed 20 LM, 40 prox and 50 mid LAD, 30 diagonal, 100 OM1, 60 OM2, 70 distal Lcx, 60 RCA; medical therapy recommended. Since last seen,    Current Outpatient Prescriptions  Medication Sig Dispense Refill  . amLODipine (NORVASC) 10 MG tablet Take 1 tablet (10 mg total) by mouth daily.  30 tablet  12  . aspirin EC 81 MG tablet Take 1 tablet (81 mg total) by mouth daily.  90 tablet  3  . benazepril (LOTENSIN) 40 MG tablet Take 40 mg by mouth daily.      . fish oil-omega-3 fatty acids 1000 MG capsule Take 1 g by mouth daily.        Marland Kitchen labetalol (NORMODYNE) 200 MG tablet Take 1 tablet (200 mg total) by mouth 2 (two) times daily.  60 tablet  12  . lovastatin (MEVACOR) 20 MG tablet Take 2 tablets (40 mg total) by mouth at bedtime.  60 tablet  6  . nitroGLYCERIN (NITROSTAT) 0.4 MG SL tablet Place 1 tablet (0.4 mg total) under the tongue every 5 (five) minutes as needed. For chest pain  25 tablet  3  . potassium chloride SA (K-DUR,KLOR-CON) 20 MEQ tablet Take 1 tablet (20 mEq total) by mouth 2 (two) times daily.  60 tablet  3  . triamterene-hydrochlorothiazide (MAXZIDE-25) 37.5-25 MG per tablet Take 1 tablet by mouth daily.  30 tablet  11   No current facility-administered medications for this visit.     Past Medical History  Diagnosis Date  . CAD (coronary artery disease)     Non obstructive  . Obesity   . History of syncope 08/2008    Secondary to hypertension  . Tobacco abuse   . Hypertension, essential, benign     a. renal art [place] (6/14): no RAS  . Chest pain, unspecified     . Noncompliance   . CKD (chronic kidney disease)     No past surgical history on file.  History   Social History  . Marital Status: Single    Spouse Name: N/A    Number of Children: N/A  . Years of Education: N/A   Occupational History  . CNA    Social History Main Topics  . Smoking status: Current Some Day Smoker -- 0.10 packs/day for 32 years    Types: Cigarettes  . Smokeless tobacco: Not on file     Comment: Started smoking at age 80 (38).  Cutting back  . Alcohol Use: No  . Drug Use: No  . Sexual Activity: Not Currently   Other Topics Concern  . 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 Remaining systems are negative.  Physical Exam: Well-developed well-nourished in no acute distress.  Skin is warm and dry.  HEENT is normal.  Neck is supple.  Chest is clear to auscultation with normal  expansion.  Cardiovascular exam is regular rate and rhythm.  Abdominal exam nontender or distended. No masses palpated. Extremities show no edema. neuro grossly intact  ECG     This encounter was created in error - please disregard.

## 2014-08-06 ENCOUNTER — Encounter: Payer: Self-pay | Admitting: Cardiology

## 2014-08-27 ENCOUNTER — Other Ambulatory Visit: Payer: Self-pay | Admitting: Nurse Practitioner

## 2014-09-02 ENCOUNTER — Encounter: Payer: Self-pay | Admitting: Cardiology

## 2014-09-08 ENCOUNTER — Inpatient Hospital Stay (HOSPITAL_COMMUNITY)
Admission: EM | Admit: 2014-09-08 | Discharge: 2014-09-11 | DRG: 293 | Disposition: A | Discharge status: Home / Self Care | Payer: No Typology Code available for payment source | Attending: Cardiology | Admitting: Cardiology

## 2014-09-08 ENCOUNTER — Emergency Department (HOSPITAL_COMMUNITY): Payer: No Typology Code available for payment source

## 2014-09-08 ENCOUNTER — Encounter (HOSPITAL_COMMUNITY): Payer: Self-pay | Admitting: Emergency Medicine

## 2014-09-08 DIAGNOSIS — E876 Hypokalemia: Secondary | ICD-10-CM | POA: Diagnosis present

## 2014-09-08 DIAGNOSIS — R7989 Other specified abnormal findings of blood chemistry: Secondary | ICD-10-CM

## 2014-09-08 DIAGNOSIS — Z6833 Body mass index (BMI) 33.0-33.9, adult: Secondary | ICD-10-CM | POA: Diagnosis not present

## 2014-09-08 DIAGNOSIS — I5023 Acute on chronic systolic (congestive) heart failure: Secondary | ICD-10-CM | POA: Diagnosis not present

## 2014-09-08 DIAGNOSIS — I2510 Atherosclerotic heart disease of native coronary artery without angina pectoris: Secondary | ICD-10-CM | POA: Diagnosis present

## 2014-09-08 DIAGNOSIS — I129 Hypertensive chronic kidney disease with stage 1 through stage 4 chronic kidney disease, or unspecified chronic kidney disease: Secondary | ICD-10-CM | POA: Diagnosis present

## 2014-09-08 DIAGNOSIS — Z880 Allergy status to penicillin: Secondary | ICD-10-CM

## 2014-09-08 DIAGNOSIS — Z9119 Patient's noncompliance with other medical treatment and regimen: Secondary | ICD-10-CM | POA: Diagnosis present

## 2014-09-08 DIAGNOSIS — R0600 Dyspnea, unspecified: Secondary | ICD-10-CM | POA: Diagnosis not present

## 2014-09-08 DIAGNOSIS — I10 Essential (primary) hypertension: Secondary | ICD-10-CM

## 2014-09-08 DIAGNOSIS — N183 Chronic kidney disease, stage 3 unspecified: Secondary | ICD-10-CM

## 2014-09-08 DIAGNOSIS — Z720 Tobacco use: Secondary | ICD-10-CM

## 2014-09-08 DIAGNOSIS — E669 Obesity, unspecified: Secondary | ICD-10-CM | POA: Diagnosis present

## 2014-09-08 DIAGNOSIS — G4733 Obstructive sleep apnea (adult) (pediatric): Secondary | ICD-10-CM | POA: Diagnosis present

## 2014-09-08 DIAGNOSIS — N182 Chronic kidney disease, stage 2 (mild): Secondary | ICD-10-CM | POA: Diagnosis present

## 2014-09-08 DIAGNOSIS — I5022 Chronic systolic (congestive) heart failure: Secondary | ICD-10-CM

## 2014-09-08 DIAGNOSIS — I161 Hypertensive emergency: Secondary | ICD-10-CM

## 2014-09-08 DIAGNOSIS — F1410 Cocaine abuse, uncomplicated: Secondary | ICD-10-CM

## 2014-09-08 DIAGNOSIS — I160 Hypertensive urgency: Secondary | ICD-10-CM

## 2014-09-08 HISTORY — DX: Sleep apnea, unspecified: G47.30

## 2014-09-08 HISTORY — DX: Other cardiomyopathies: I42.8

## 2014-09-08 LAB — CBC WITH DIFFERENTIAL/PLATELET
Basophils Absolute: 0 10*3/uL (ref 0.0–0.1)
Basophils Relative: 1 % (ref 0–1)
EOS ABS: 0.2 10*3/uL (ref 0.0–0.7)
Eosinophils Relative: 3 % (ref 0–5)
HCT: 40.8 % (ref 39.0–52.0)
HEMOGLOBIN: 14.3 g/dL (ref 13.0–17.0)
Lymphocytes Relative: 31 % (ref 12–46)
Lymphs Abs: 1.7 10*3/uL (ref 0.7–4.0)
MCH: 30.6 pg (ref 26.0–34.0)
MCHC: 35 g/dL (ref 30.0–36.0)
MCV: 87.4 fL (ref 78.0–100.0)
MONOS PCT: 9 % (ref 3–12)
Monocytes Absolute: 0.5 10*3/uL (ref 0.1–1.0)
NEUTROS ABS: 3.2 10*3/uL (ref 1.7–7.7)
NEUTROS PCT: 56 % (ref 43–77)
PLATELETS: 305 10*3/uL (ref 150–400)
RBC: 4.67 MIL/uL (ref 4.22–5.81)
RDW: 13.9 % (ref 11.5–15.5)
WBC: 5.6 10*3/uL (ref 4.0–10.5)

## 2014-09-08 LAB — CBC
HCT: 37.1 % — ABNORMAL LOW (ref 39.0–52.0)
Hemoglobin: 12.5 g/dL — ABNORMAL LOW (ref 13.0–17.0)
MCH: 29.8 pg (ref 26.0–34.0)
MCHC: 33.7 g/dL (ref 30.0–36.0)
MCV: 88.3 fL (ref 78.0–100.0)
Platelets: 271 10*3/uL (ref 150–400)
RBC: 4.2 MIL/uL — ABNORMAL LOW (ref 4.22–5.81)
RDW: 14 % (ref 11.5–15.5)
WBC: 5.3 10*3/uL (ref 4.0–10.5)

## 2014-09-08 LAB — COMPREHENSIVE METABOLIC PANEL
ALK PHOS: 75 U/L (ref 39–117)
ALT: 22 U/L (ref 0–53)
AST: 29 U/L (ref 0–37)
Albumin: 3.8 g/dL (ref 3.5–5.2)
Anion gap: 15 (ref 5–15)
BUN: 24 mg/dL — ABNORMAL HIGH (ref 6–23)
CALCIUM: 9 mg/dL (ref 8.4–10.5)
CO2: 25 meq/L (ref 19–32)
Chloride: 99 mEq/L (ref 96–112)
Creatinine, Ser: 1.64 mg/dL — ABNORMAL HIGH (ref 0.50–1.35)
GFR calc Af Amer: 56 mL/min — ABNORMAL LOW (ref >90)
GFR calc non Af Amer: 48 mL/min — ABNORMAL LOW (ref >90)
GLUCOSE: 111 mg/dL — AB (ref 70–99)
POTASSIUM: 3.3 meq/L — AB (ref 3.7–5.3)
SODIUM: 139 meq/L (ref 137–147)
Total Bilirubin: 0.4 mg/dL (ref 0.3–1.2)
Total Protein: 8.3 g/dL (ref 6.0–8.3)

## 2014-09-08 LAB — MRSA PCR SCREENING: MRSA by PCR: NEGATIVE

## 2014-09-08 LAB — TROPONIN I
Troponin I: 0.37 ng/mL (ref <0.30)
Troponin I: <0.3 ng/mL (ref <0.30)
Troponin I: 0.32 ng/mL (ref <0.30)

## 2014-09-08 LAB — PRO B NATRIURETIC PEPTIDE: PRO B NATRI PEPTIDE: 1623 pg/mL — AB (ref 0–125)

## 2014-09-08 LAB — TSH: TSH: 0.875 u[IU]/mL (ref 0.350–4.500)

## 2014-09-08 LAB — CREATININE, SERUM
Creatinine, Ser: 2 mg/dL — ABNORMAL HIGH (ref 0.50–1.35)
GFR calc Af Amer: 44 mL/min — ABNORMAL LOW (ref >90)
GFR, EST NON AFRICAN AMERICAN: 38 mL/min — AB (ref >90)

## 2014-09-08 MED ORDER — SODIUM CHLORIDE 0.9 % IV SOLN: 250.0000 mL | INTRAVENOUS | Status: DC | PRN

## 2014-09-08 MED ORDER — HEPARIN SODIUM (PORCINE) 5000 UNIT/ML IJ SOLN
5000.0000 [IU] | Freq: Three times a day (TID) | INTRAMUSCULAR | Status: DC
  Administered 2014-09-08 – 2014-09-11 (×8): 5000 [IU] via SUBCUTANEOUS
  Filled 2014-09-08 (×11): qty 1

## 2014-09-08 MED ORDER — ACETAMINOPHEN 325 MG PO TABS
650.0000 mg | ORAL_TABLET | ORAL | Status: DC | PRN
  Administered 2014-09-08 – 2014-09-09 (×4): 650 mg via ORAL
  Filled 2014-09-08 (×4): qty 2

## 2014-09-08 MED ORDER — LABETALOL HCL 5 MG/ML IV SOLN
20.0000 mg | INTRAVENOUS | Status: DC | PRN
Start: 2014-09-08 — End: 2014-09-11
  Administered 2014-09-08 – 2014-09-09 (×3): 20 mg via INTRAVENOUS
  Filled 2014-09-08 (×3): qty 4

## 2014-09-08 MED ORDER — NITROGLYCERIN IN D5W 200-5 MCG/ML-% IV SOLN
0.0000 ug/min | Freq: Once | INTRAVENOUS | Status: AC
  Administered 2014-09-08: 55 ug/min via INTRAVENOUS
  Filled 2014-09-08: qty 250

## 2014-09-08 MED ORDER — NITROGLYCERIN IN D5W 200-5 MCG/ML-% IV SOLN
0.0000 ug/min | Freq: Once | INTRAVENOUS | Status: AC
  Administered 2014-09-08: 30 ug/min via INTRAVENOUS

## 2014-09-08 MED ORDER — ASPIRIN 81 MG PO CHEW
324.0000 mg | CHEWABLE_TABLET | Freq: Once | ORAL | Status: AC
  Administered 2014-09-08: 324 mg via ORAL
  Filled 2014-09-08: qty 4

## 2014-09-08 MED ORDER — LABETALOL HCL 200 MG PO TABS
200.0000 mg | ORAL_TABLET | Freq: Three times a day (TID) | ORAL | Status: DC
  Administered 2014-09-08 – 2014-09-09 (×3): 200 mg via ORAL
  Filled 2014-09-08 (×6): qty 1

## 2014-09-08 MED ORDER — BENAZEPRIL HCL 40 MG PO TABS
40.0000 mg | ORAL_TABLET | Freq: Every day | ORAL | Status: DC
  Administered 2014-09-08 – 2014-09-11 (×4): 40 mg via ORAL
  Filled 2014-09-08 (×4): qty 1

## 2014-09-08 MED ORDER — SODIUM CHLORIDE 0.9 % IJ SOLN: 3.0000 mL | INTRAMUSCULAR | Status: DC | PRN

## 2014-09-08 MED ORDER — FUROSEMIDE 10 MG/ML IJ SOLN
40.0000 mg | Freq: Once | INTRAMUSCULAR | Status: AC
  Administered 2014-09-08: 40 mg via INTRAVENOUS
  Filled 2014-09-08: qty 4

## 2014-09-08 MED ORDER — ONDANSETRON HCL 4 MG/2ML IJ SOLN: 4.0000 mg | Freq: Four times a day (QID) | INTRAMUSCULAR | Status: DC | PRN

## 2014-09-08 MED ORDER — NITROGLYCERIN 0.4 MG SL SUBL: 0.4000 mg | SUBLINGUAL_TABLET | SUBLINGUAL | Status: DC | PRN

## 2014-09-08 MED ORDER — PRAVASTATIN SODIUM 40 MG PO TABS
40.0000 mg | ORAL_TABLET | Freq: Every day | ORAL | Status: DC
  Administered 2014-09-09 – 2014-09-10 (×2): 40 mg via ORAL
  Filled 2014-09-08 (×3): qty 1

## 2014-09-08 MED ORDER — ASPIRIN EC 81 MG PO TBEC
81.0000 mg | DELAYED_RELEASE_TABLET | Freq: Every day | ORAL | Status: DC
  Administered 2014-09-08 – 2014-09-11 (×4): 81 mg via ORAL
  Filled 2014-09-08 (×4): qty 1

## 2014-09-08 MED ORDER — AMLODIPINE BESYLATE 10 MG PO TABS
10.0000 mg | ORAL_TABLET | Freq: Every day | ORAL | Status: DC
  Administered 2014-09-08 – 2014-09-11 (×4): 10 mg via ORAL
  Filled 2014-09-08: qty 1
  Filled 2014-09-08: qty 2
  Filled 2014-09-08 (×2): qty 1

## 2014-09-08 MED ORDER — SODIUM CHLORIDE 0.9 % IJ SOLN
3.0000 mL | Freq: Two times a day (BID) | INTRAMUSCULAR | Status: DC
  Administered 2014-09-08 – 2014-09-11 (×4): 3 mL via INTRAVENOUS

## 2014-09-08 NOTE — ED Notes (Signed)
MD at bedside. 

## 2014-09-08 NOTE — ED Notes (Signed)
Ice chips given to pt.  

## 2014-09-08 NOTE — ED Notes (Signed)
C/o sob, increased wob, BP 205/138, ongoing gradually worse over 1 month, worse last night. (denies: pain or fever). Cough productive for phlegm only.

## 2014-09-08 NOTE — ED Notes (Signed)
Meal tray ordered 

## 2014-09-08 NOTE — ED Provider Notes (Signed)
8:08 AM Care assumed from Dr. Preston Fleeting.  Labetalol has not dramatically improved his blood pressure.  I'll start patient on a nitro drip.  He still has increased WOB and bibasilar rales.  Troponin slightly elevated.  I discussed case with Dr. Delton See (Cardiology) for admission.  Will continue to attempt BP control.   4:44 PM Cardiology have admitted.  BP improved.    CRITICAL CARE Performed by: Blake Divine DAVID III   Total critical care time: 30  Critical care time was exclusive of separately billable procedures and treating other patients.  Critical care was necessary to treat or prevent imminent or life-threatening deterioration.  Critical care was time spent personally by me on the following activities: development of treatment plan with patient and/or surrogate as well as nursing, discussions with consultants, evaluation of patient's response to treatment, examination of patient, obtaining history from patient or surrogate, ordering and performing treatments and interventions, ordering and review of laboratory studies, ordering and review of radiographic studies, pulse oximetry and re-evaluation of patient's condition.  Clinical Impression: 1. Dyspnea   2. Hypertensive emergency       Warnell Forester, MD 09/08/14 [phone]

## 2014-09-08 NOTE — ED Notes (Signed)
Critical lab, to Dr. Loretha Stapler.

## 2014-09-08 NOTE — ED Provider Notes (Signed)
CSN: [id number]     Arrival date & time 09/08/14  4098 History   First MD Initiated Contact with Patient 09/08/14 [phone]     Chief Complaint  Patient presents with  . Shortness of Breath     (Consider location/radiation/quality/duration/timing/severity/associated sxs/prior Treatment) Patient is a 48 y.o. male presenting with shortness of breath. The history is provided by the patient.  Shortness of Breath He states that he has been having difficulty breathing which waxes and wanes. This has been going on for at least a month, if not more. However, over the last 2 days, breathing has gotten worse and has not improved. He has dyspnea on exertion and he was unable to lie flat last night. He does have some burning pain in his chest when he walks. There is no nausea, vomiting, diaphoresis. He does have a mild cough which is non-productive. He claims compliance with his medications but has not taken his morning doses yet. He is being treated for hypertension and says his blood pressure is always elevated. He denies fever, chills, sweats.  Past Medical History  Diagnosis Date  . CAD (coronary artery disease)     Non obstructive  . Obesity   . History of syncope 08/2008    Secondary to hypertension  . Tobacco abuse   . Hypertension, essential, benign     a. renal art [place] (6/14): no RAS  . Chest pain, unspecified   . Noncompliance   . CKD (chronic kidney disease)    History reviewed. No pertinent past surgical history. Family History  Problem Relation Age of Onset  . Lupus Mother   . Heart disease Maternal Grandfather   . Coronary artery disease Neg Hx    History  Substance Use Topics  . Smoking status: Current Some Day Smoker -- 0.10 packs/day for 32 years    Types: Cigarettes  . Smokeless tobacco: Not on file     Comment: Started smoking at age 80 (82).  Cutting back  . Alcohol Use: No    Review of Systems  Respiratory: Positive for shortness of breath.   All other systems  reviewed and are negative.     Allergies  Penicillins and Shrimp  Home Medications   Prior to Admission medications   Medication Sig Start Date End Date Taking? Authorizing Provider  amLODipine (NORVASC) 10 MG tablet Take 1 tablet (10 mg total) by mouth daily. 01/28/14   Lewayne Bunting, MD  aspirin EC 81 MG tablet Take 1 tablet (81 mg total) by mouth daily. 05/14/13   Lewayne Bunting, MD  benazepril (LOTENSIN) 40 MG tablet Take 40 mg by mouth daily.    Historical Provider, MD  fish oil-omega-3 fatty acids 1000 MG capsule Take 1 g by mouth daily.      Historical Provider, MD  labetalol (NORMODYNE) 200 MG tablet Take 1 tablet (200 mg total) by mouth 2 (two) times daily. 10/29/13   Lewayne Bunting, MD  lovastatin (MEVACOR) 20 MG tablet Take 2 tablets (40 mg total) by mouth at bedtime. 01/25/14   Vivi Barrack, MD  nitroGLYCERIN (NITROSTAT) 0.4 MG SL tablet Place 1 tablet (0.4 mg total) under the tongue every 5 (five) minutes as needed. For chest pain 08/10/12   Herby Abraham, MD  potassium chloride SA (KLOR-CON M20) 20 MEQ tablet Take 1 tablet (20 mEq total) by mouth 2 (two) times daily. 08/28/14   Lewayne Bunting, MD  triamterene-hydrochlorothiazide (MAXZIDE-25) 37.5-25 MG per tablet Take 1 tablet by mouth  daily. 08/31/13   Lewayne Bunting, MD   BP 205/138  Pulse 97  Temp(Src) 98.2 F (36.8 C) (Oral)  Resp 28  SpO2 99% Physical Exam  Nursing note and vitals reviewed.  48 year old male, resting comfortably and in no acute distress. Vital signs are significant for severe hypertension, and tachypnea. Oxygen saturation is 99%, which is normal. Head is normocephalic and atraumatic. PERRLA, EOMI. Oropharynx is clear. Neck is nontender and supple without adenopathy or JVD. Back is nontender and there is no CVA tenderness. Lungs have bibasilar rales, and faint expiratory wheezes. There are no rhonchi heard. Chest is nontender. Heart has regular rate and rhythm without murmur. Abdomen is  soft, flat, nontender without masses or hepatosplenomegaly and peristalsis is normoactive. Extremities have no cyanosis or edema, full range of motion is present. Skin is warm and dry without rash. Neurologic: Mental status is normal, cranial nerves are intact, there are no motor or sensory deficits.  ED Course  Procedures (including critical care time) Labs Review Results for orders placed during the hospital encounter of 09/08/14  CBC WITH DIFFERENTIAL      Result Value Ref Range   WBC 5.6  4.0 - 10.5 K/uL   RBC 4.67  4.22 - 5.81 MIL/uL   Hemoglobin 14.3  13.0 - 17.0 g/dL   HCT 59.5  63.8 - 75.6 %   MCV 87.4  78.0 - 100.0 fL   MCH 30.6  26.0 - 34.0 pg   MCHC 35.0  30.0 - 36.0 g/dL   RDW 43.3  29.5 - 18.8 %   Platelets 305  150 - 400 K/uL   Neutrophils Relative % 56  43 - 77 %   Neutro Abs 3.2  1.7 - 7.7 K/uL   Lymphocytes Relative 31  12 - 46 %   Lymphs Abs 1.7  0.7 - 4.0 K/uL   Monocytes Relative 9  3 - 12 %   Monocytes Absolute 0.5  0.1 - 1.0 K/uL   Eosinophils Relative 3  0 - 5 %   Eosinophils Absolute 0.2  0.0 - 0.7 K/uL   Basophils Relative 1  0 - 1 %   Basophils Absolute 0.0  0.0 - 0.1 K/uL   EKG Interpretation   Date/Time:  Sunday September 08 2014 06:49:15 EDT Ventricular Rate:  92 PR Interval:  171 QRS Duration: 94 QT Interval:  388 QTC Calculation: 480 R Axis:   44 Text Interpretation:  Sinus rhythm Left atrial enlargement Left  ventricular hypertrophy Anterior Q waves, possibly due to LVH Nonspecific  T abnormalities, lateral leads Baseline wander in lead(s) V3 When compared  with ECG of 3/6/20115, there has been improvement in repolarization  abnormality Confirmed by [facility]  MD, Rasheema Truluck ([id number]) on 09/08/2014 7:01:27 AM      CRITICAL CARE Performed by: [email or web address] Total critical care time: 35 minutes Critical care time was exclusive of separately billable procedures and treating other patients. Critical care was necessary to treat or prevent imminent or  life-threatening deterioration. Critical care was time spent personally by me on the following activities: development of treatment plan with patient and/or surrogate as well as nursing, discussions with consultants, evaluation of patient's response to treatment, examination of patient, obtaining history from patient or surrogate, ordering and performing treatments and interventions, ordering and review of laboratory studies, ordering and review of radiographic studies, pulse oximetry and re-evaluation of patient's condition.  MDM   Final diagnoses:  Dyspnea  Hypertensive urgency    Exertional dyspnea with  orthopnea concerning for congestive heart failure. Hypertensive urgency. Chest x-ray has been ordered as well as screening labs. Old records are reviewed and he has three-vessel coronary artery disease but only moderate in degree.   Labetalol was given for severe hypertension. After initial dose of labetalol, blood pressures come down slightly and will need to be repeated. Chest x-ray and BNP are still pending at this point. Case is endorsed to Dr. Loretha StaplerWofford.    Dione Boozeavid Dalene Robards, MD 09/08/14 0730

## 2014-09-08 NOTE — H&P (Signed)
CARDIOLOGY ADMISSION NOTE  Patient ID: [name] MRN: [id number] DOB/AGE: 1966/01/07 48 y.o.  Admit date: 09/08/2014 Primary Physician   No PCP Per Patient Primary Cardiologist   Dr. Olga Millers Chief Complaint    SOB  HPI:  The patient has a history of OSA, hypertensive urgency, substance abuse and medical non adherence.  He has nonobstructive CAD with his last cath in 4/15.  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  However he was managed medically.  His last echo showed an EF of 45 - 50%.  He has canceled and no showed for two cardiology office appointments since his cath in April.  He now presents with acute on chronic dyspnea and hypertensive urgency with an initial BP of 206/131.  Respiratory rate was 21.  He was treated with 20 mg of IV labetalol and NTG infusion has been started.   He does have CKD but creat was actually lower than previous (1.64 on presentation).  Troponin is slightly elevated.  ProBNP is 1623.    He reports that his dyspnea started a few weeks ago.  However it got worse in the last couple of days.  He says he takes his meds.  However, he says he has been out of his ACE inhibitor and I don't see a beta blocker on the list.  He watches his salt but does not weight himself daily.  He does have chest pain when he walks.  He says this is new but he also reported this in the spring when he had his cath.  He does not have pain at rest.  He does not have a BP cuff.  He has had PND and orthopnea in the last one to two days.  He has had no fevers chills or cough.   Past Medical History  Diagnosis Date  . CAD (coronary artery disease)     Small vessel with occluded small OM and nonobstructive large vessel disease (cath 02/2014)  . Obesity   . History of syncope 08/2008    Secondary to hypertension  . Tobacco abuse   . Hypertension, essential, benign     a. renal art [place] (6/14): no RAS  . Noncompliance   . CKD (chronic kidney disease)   . Nonischemic  cardiomyopathy     EF 45%  . Sleep apnea     Past Surgical History  Procedure Laterality Date  . None      Allergies  Allergen Reactions  . Penicillins Other (See Comments)    Patient states that it makes his throat itch really bad, and feels like it is closing up.   . Shrimp [Shellfish Allergy]     Throat swells   Prior to Admission medications   Medication Sig Start Date End Date Taking? Authorizing Provider  amLODipine (NORVASC) 10 MG tablet Take 1 tablet (10 mg total) by mouth daily. 01/28/14  Yes Lewayne Bunting, MD  aspirin EC 81 MG tablet Take 1 tablet (81 mg total) by mouth daily. 05/14/13  Yes Lewayne Bunting, MD  benazepril (LOTENSIN) 40 MG tablet Take 40 mg by mouth daily.   Yes Historical Provider, MD  labetalol (NORMODYNE) 200 MG tablet Take 200 mg by mouth daily.   Yes Historical Provider, MD  lovastatin (MEVACOR) 20 MG tablet Take 2 tablets (40 mg total) by mouth at bedtime. 01/25/14  Yes Vivi Barrack, MD  nitroGLYCERIN (NITROSTAT) 0.4 MG SL tablet Place 1 tablet (0.4 mg total) under the tongue  every 5 (five) minutes as needed. For chest pain 08/10/12  Yes Herby Abrahamhomas D Stuckey, MD    History   Social History  . Marital Status: Single    Spouse Name: N/A    Number of Children: 5  . Years of Education: N/A   Occupational History  . [employer]   .     Social History Main Topics  . Smoking status: Current Some Day Smoker -- 0.10 packs/day for 32 years    Types: Cigarettes  . Smokeless tobacco: Not on file     Comment: Started smoking at age [age](1980).  Cutting back  . Alcohol Use: No  . Drug Use: No  . Sexual Activity: Not Currently   Other Topics Concern  . Not on file   Social History Narrative   Lives with girlfriend.    Family History  Problem Relation Age of Onset  . Lupus Mother   . Heart disease Maternal Grandfather   . Coronary artery disease Neg Hx      ROS:  As stated in the HPI and negative for all other systems.  Physical Exam: Blood  pressure 184/121, pulse 77, temperature 98.2 F (36.8 C), temperature source Oral, resp. rate 22, SpO2 99.00%.  GENERAL:  Well appearing HEENT:  Pupils equal round and reactive, fundi not visualized, oral mucosa unremarkable NECK:  No jugular venous distention, waveform within normal limits, carotid upstroke brisk and symmetric, no bruits, no thyromegaly LYMPHATICS:  No cervical, inguinal adenopathy LUNGS:  Clear to auscultation bilaterally BACK:  No CVA tenderness CHEST:  Unremarkable HEART:  PMI not displaced or sustained,S1 and S2 within normal limits, no S3, no S4, no clicks, no rubs, no murmurs ABD:  Flat, positive bowel sounds normal in frequency in pitch, no bruits, no rebound, no guarding, no midline pulsatile mass, no hepatomegaly, no splenomegaly EXT:  2 plus pulses throughout, no edema, no cyanosis no clubbing SKIN:  No rashes no nodules NEURO:  Cranial nerves II through XII grossly intact, motor grossly intact throughout PSYCH:  Cognitively intact, oriented to person place and time   Labs: Lab Results  Component Value Date   BUN 24* 09/08/2014   Lab Results  Component Value Date   CREATININE 1.64* 09/08/2014   Lab Results  Component Value Date   NA 139 09/08/2014   K 3.3* 09/08/2014   CL 99 09/08/2014   CO2 25 09/08/2014   Lab Results  Component Value Date   TROPONINI 0.37* 09/08/2014   Lab Results  Component Value Date   WBC 5.6 09/08/2014   HGB 14.3 09/08/2014   HCT 40.8 09/08/2014   MCV 87.4 09/08/2014   PLT 305 09/08/2014    Lab Results  Component Value Date   ALT 22 09/08/2014   AST 29 09/08/2014   ALKPHOS 75 09/08/2014   BILITOT 0.4 09/08/2014      Radiology:  CXR:  Prominent interstitial lung markings in the mid and lower lung  regions. Prominent perihilar structures. Heart size is upper limits  of normal. Negative for pneumothorax.    EKG:  NSR, rate 82, LVH, repolarization changes, QT slightly prolonged.  No acute ST T wave changes.   09/08/2014  ASSESSMENT AND PLAN:   HYPERTENSIVE URGENCY:  We will manage this in the short term with IV NTG and resumption of previous medications.  However, the issue will present again without some closer follow up.  He insists that he has had frequent follow up with Dr. Jens Somrenshaw and saw  him since he last was seen by our NP despite evidence to the contrary.  He became upset when discussing this.    ELEVATED TROPONIN:   I would control the BP and maximize meds to include a long acting nitrate at discharge.  We could consider BiDil although I think that compliance will be an issue.    CARDIOMYOPATHY:  Repeat echo this admission.  I will give one dose of IV Lasix in the ED.  However, we need to watch is creat closely.    DISPOSITION:  He needs a primary MD and TOC appt at discharge.     SignedRollene Rotunda 09/08/2014, 11:40 AM

## 2014-09-09 DIAGNOSIS — I517 Cardiomegaly: Secondary | ICD-10-CM

## 2014-09-09 LAB — BASIC METABOLIC PANEL
Anion gap: 14 (ref 5–15)
BUN: 26 mg/dL — ABNORMAL HIGH (ref 6–23)
CO2: 27 mEq/L (ref 19–32)
Calcium: 8.9 mg/dL (ref 8.4–10.5)
Chloride: 98 mEq/L (ref 96–112)
Creatinine, Ser: 2.1 mg/dL — ABNORMAL HIGH (ref 0.50–1.35)
GFR calc Af Amer: 41 mL/min — ABNORMAL LOW (ref >90)
GFR, EST NON AFRICAN AMERICAN: 36 mL/min — AB (ref >90)
Glucose, Bld: 128 mg/dL — ABNORMAL HIGH (ref 70–99)
Potassium: 3.5 mEq/L — ABNORMAL LOW (ref 3.7–5.3)
Sodium: 139 mEq/L (ref 137–147)

## 2014-09-09 LAB — RAPID URINE DRUG SCREEN, HOSP PERFORMED
Amphetamines: NOT DETECTED
BARBITURATES: NOT DETECTED
Benzodiazepines: NOT DETECTED
COCAINE: NOT DETECTED
Opiates: NOT DETECTED
TETRAHYDROCANNABINOL: NOT DETECTED

## 2014-09-09 LAB — TROPONIN I

## 2014-09-09 MED ORDER — POTASSIUM CHLORIDE CRYS ER 20 MEQ PO TBCR
40.0000 meq | EXTENDED_RELEASE_TABLET | Freq: Once | ORAL | Status: AC
  Administered 2014-09-09: 40 meq via ORAL
  Filled 2014-09-09: qty 2

## 2014-09-09 MED ORDER — NITROGLYCERIN IN D5W 200-5 MCG/ML-% IV SOLN
0.0000 ug/min | INTRAVENOUS | Status: DC
  Administered 2014-09-09: 30 ug/min via INTRAVENOUS
  Filled 2014-09-09: qty 250

## 2014-09-09 MED ORDER — FUROSEMIDE 10 MG/ML IJ SOLN
80.0000 mg | Freq: Two times a day (BID) | INTRAMUSCULAR | Status: DC
  Administered 2014-09-09 – 2014-09-10 (×4): 80 mg via INTRAVENOUS
  Filled 2014-09-09 (×5): qty 8

## 2014-09-09 MED ORDER — LABETALOL HCL 5 MG/ML IV SOLN
0.5000 mg/min | INTRAVENOUS | Status: DC
  Administered 2014-09-09 – 2014-09-10 (×2): 0.5 mg/min via INTRAVENOUS
  Filled 2014-09-09 (×3): qty 100

## 2014-09-09 NOTE — Progress Notes (Addendum)
PROGRESS NOTE  Subjective:   48 yo with hx of HTN Poor compliance Admitted with hypertensive urgency.   Objective:    Vital Signs:   Temp:  [97.7 F (36.5 C)-98.6 F (37 C)] 98.4 F (36.9 C) (10/19 0750) Pulse Rate:  [65-94] 69 (10/19 0900) Resp:  [14-32] 24 (10/19 0900) BP: (134-206)/(67-131) 174/90 mmHg (10/19 0900) SpO2:  [89 %-100 %] 94 % (10/19 0900) Weight:  [218 lb 14.7 oz (99.3 kg)-223 lb 15.8 oz (101.6 kg)] 218 lb 14.7 oz (99.3 kg) (10/19 0400)      24-hour weight change: Weight change:   Weight trends: Filed Weights   09/08/14 1842 09/09/14 0400  Weight: 223 lb 15.8 oz (101.6 kg) 218 lb 14.7 oz (99.3 kg)    Intake/Output:  10/18 0701 - 10/19 0700 In: 103.1 [I.V.:103.1] Out: 1100 [Urine:1100] Total I/O In: 18 [I.V.:18] Out: -    Physical Exam: BP 174/90  Pulse 69  Temp(Src) 98.4 F (36.9 C) (Oral)  Resp 24  Ht 5\' 7"  (1.702 m)  Wt 218 lb 14.7 oz (99.3 kg)  BMI 34.28 kg/m2  SpO2 94%  Wt Readings from Last 3 Encounters:  09/09/14 218 lb 14.7 oz (99.3 kg)  02/21/14 200 lb (90.719 kg)  02/21/14 200 lb (90.719 kg)    General: Vital signs reviewed and noted.   Head: Normocephalic, atraumatic.  Eyes: conjunctivae/corneas clear.  EOM's intact.   Throat: normal  Neck:  normal   Lungs:    clear   Heart:  RR  Abdomen:  Soft, non-tender, non-distended    Extremities: Trace edema   Neurologic: A&O X3, CN II - XII are grossly intact.   Psych: Normal     Labs: BMET:  Recent Labs  09/08/14 0645 09/08/14 1555 09/09/14 0303  NA 139  --  139  K 3.3*  --  3.5*  CL 99  --  98  CO2 25  --  27  GLUCOSE 111*  --  128*  BUN 24*  --  26*  CREATININE 1.64* 2.00* 2.10*  CALCIUM 9.0  --  8.9    Liver function tests:  Recent Labs  09/08/14 0645  AST 29  ALT 22  ALKPHOS 75  BILITOT 0.4  PROT 8.3  ALBUMIN 3.8   No results found for this basename: LIPASE, AMYLASE,  in the last 72 hours  CBC:  Recent Labs  09/08/14 0645  09/08/14 1555  WBC 5.6 5.3  NEUTROABS 3.2  --   HGB 14.3 12.5*  HCT 40.8 37.1*  MCV 87.4 88.3  PLT 305 271    Cardiac Enzymes:  Recent Labs  09/08/14 0645 09/08/14 1555 09/08/14 2022 09/09/14 0303  TROPONINI 0.37* <0.30 0.32* <0.30    Coagulation Studies: No results found for this basename: LABPROT, INR,  in the last 72 hours  Other: No components found with this basename: POCBNP,  No results found for this basename: DDIMER,  in the last 72 hours No results found for this basename: HGBA1C,  in the last 72 hours No results found for this basename: CHOL, HDL, LDLCALC, TRIG, CHOLHDL,  in the last 72 hours  Recent Labs  09/08/14 1555  TSH 0.875   No results found for this basename: VITAMINB12, FOLATE, FERRITIN, TIBC, IRON, RETICCTPCT,  in the last 72 hours   Other results:  EKG :    NSR, LVH with repol.  Medications:    Infusions:    Scheduled Medications: . amLODipine  10 mg Oral  Daily  . aspirin EC  81 mg Oral Daily  . benazepril  40 mg Oral Daily  . furosemide  80 mg Intravenous BID  . heparin  5,000 Units Subcutaneous 3 times per day  . labetalol  200 mg Oral TID  . pravastatin  40 mg Oral q1800  . sodium chloride  3 mL Intravenous Q12H    Assessment/ Plan:   Active Problems:  1.   Hypertensive urgency:  Complains of HA with NTG drip. Will dc and start labetalol drip. Continue current meds  Give lasix scheduled.  Will get UDS.  + for cocaine in March  \2. 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    Disposition:  Length of Stay: 1  Vesta MixerPhilip J. Nikole Swartzentruber, Montez HagemanJr., MD, [facility] 09/09/2014, 11:13 AM Office [phone] Pager [phone]

## 2014-09-09 NOTE — Progress Notes (Signed)
Utilization Review Completed.[name] T10/19/2015  

## 2014-09-10 DIAGNOSIS — E876 Hypokalemia: Secondary | ICD-10-CM

## 2014-09-10 LAB — BASIC METABOLIC PANEL
ANION GAP: 16 — AB (ref 5–15)
BUN: 26 mg/dL — ABNORMAL HIGH (ref 6–23)
CO2: 25 meq/L (ref 19–32)
CREATININE: 1.81 mg/dL — AB (ref 0.50–1.35)
Calcium: 9 mg/dL (ref 8.4–10.5)
Chloride: 96 mEq/L (ref 96–112)
GFR calc non Af Amer: 43 mL/min — ABNORMAL LOW (ref >90)
GFR, EST AFRICAN AMERICAN: 49 mL/min — AB (ref >90)
Glucose, Bld: 107 mg/dL — ABNORMAL HIGH (ref 70–99)
Potassium: 3.2 mEq/L — ABNORMAL LOW (ref 3.7–5.3)
SODIUM: 137 meq/L (ref 137–147)

## 2014-09-10 MED ORDER — POTASSIUM CHLORIDE CRYS ER 20 MEQ PO TBCR
40.0000 meq | EXTENDED_RELEASE_TABLET | Freq: Once | ORAL | Status: AC
  Administered 2014-09-10: 40 meq via ORAL
  Filled 2014-09-10: qty 2

## 2014-09-10 MED ORDER — LABETALOL HCL 300 MG PO TABS
300.0000 mg | ORAL_TABLET | Freq: Three times a day (TID) | ORAL | Status: DC
  Administered 2014-09-10 – 2014-09-11 (×4): 300 mg via ORAL
  Filled 2014-09-10 (×6): qty 1

## 2014-09-10 MED ORDER — POTASSIUM CHLORIDE CRYS ER 20 MEQ PO TBCR
20.0000 meq | EXTENDED_RELEASE_TABLET | Freq: Two times a day (BID) | ORAL | Status: DC
  Administered 2014-09-10 – 2014-09-11 (×3): 20 meq via ORAL
  Filled 2014-09-10 (×4): qty 1

## 2014-09-10 MED ORDER — LABETALOL HCL 5 MG/ML IV SOLN
0.5000 mg/min | INTRAVENOUS | Status: DC
  Filled 2014-09-10: qty 100

## 2014-09-10 NOTE — Progress Notes (Addendum)
PROGRESS NOTE  Subjective:   48 yo with hx of HTN Poor compliance Admitted with hypertensive urgency / accelerated HTN   BP is better with the Labetalol drip   Objective:    Vital Signs:   Temp:  [97.9 F (36.6 C)-98.3 F (36.8 C)] 97.9 F (36.6 C) (10/20 0754) Pulse Rate:  [65-85] 70 (10/20 0800) Resp:  [11-29] 14 (10/20 0800) BP: (130-184)/(65-126) 170/101 mmHg (10/20 0800) SpO2:  [91 %-100 %] 97 % (10/20 0800) Weight:  [215 lb 6.2 oz (97.7 kg)] 215 lb 6.2 oz (97.7 kg) (10/20 0424)  Last BM Date: 09/09/14   24-hour weight change: Weight change: -8 lb 9.6 oz (-3.9 kg)  Weight trends: Filed Weights   09/08/14 1842 09/09/14 0400 09/10/14 0424  Weight: 223 lb 15.8 oz (101.6 kg) 218 lb 14.7 oz (99.3 kg) 215 lb 6.2 oz (97.7 kg)    Intake/Output:  10/19 0701 - 10/20 0700 In: 1062.9 [P.O.:840; I.V.:222.9] Out: 1875 [Urine:1875] Total I/O In: 360 [P.O.:360] Out: 325 [Urine:325]   Physical Exam: BP 170/101  Pulse 70  Temp(Src) 97.9 F (36.6 C) (Oral)  Resp 14  Ht 5\' 7"  (1.702 m)  Wt 215 lb 6.2 oz (97.7 kg)  BMI 33.73 kg/m2  SpO2 97%  Wt Readings from Last 3 Encounters:  09/10/14 215 lb 6.2 oz (97.7 kg)  02/21/14 200 lb (90.719 kg)  02/21/14 200 lb (90.719 kg)    General: Vital signs reviewed and noted.   Head: Normocephalic, atraumatic.  Eyes: conjunctivae/corneas clear.  EOM's intact.   Throat: normal  Neck:  normal   Lungs:  clear   Heart:  RR  Abdomen:  Soft, non-tender, non-distended    Extremities: Trace edema   Neurologic: A&O X3, CN II - XII are grossly intact.   Psych: Normal     Labs: BMET:  Recent Labs  09/09/14 0303 09/10/14 0237  NA 139 137  K 3.5* 3.2*  CL 98 96  CO2 27 25  GLUCOSE 128* 107*  BUN 26* 26*  CREATININE 2.10* 1.81*  CALCIUM 8.9 9.0    Liver function tests:  Recent Labs  09/08/14 0645  AST 29  ALT 22  ALKPHOS 75  BILITOT 0.4  PROT 8.3  ALBUMIN 3.8   No results found for this basename: LIPASE,  AMYLASE,  in the last 72 hours  CBC:  Recent Labs  09/08/14 0645 09/08/14 1555  WBC 5.6 5.3  NEUTROABS 3.2  --   HGB 14.3 12.5*  HCT 40.8 37.1*  MCV 87.4 88.3  PLT 305 271    Cardiac Enzymes:  Recent Labs  09/08/14 0645 09/08/14 1555 09/08/14 2022 09/09/14 0303  TROPONINI 0.37* <0.30 0.32* <0.30    Coagulation Studies: No results found for this basename: LABPROT, INR,  in the last 72 hours   Recent Labs  09/08/14 1555  TSH 0.875   No results found for this basename: VITAMINB12, FOLATE, FERRITIN, TIBC, IRON, RETICCTPCT,  in the last 72 hours   Other results:  EKG :    NSR, LVH with repol.  Medications:    Infusions: . labetalol (NORMODYNE) infusion 1 mg/min (09/10/14 0943)    Scheduled Medications: . amLODipine  10 mg Oral Daily  . aspirin EC  81 mg Oral Daily  . benazepril  40 mg Oral Daily  . furosemide  80 mg Intravenous BID  . heparin  5,000 Units Subcutaneous 3 times per day  . pravastatin  40 mg Oral q1800  .  sodium chloride  3 mL Intravenous Q12H    Assessment/ Plan:   Active Problems:  1.   Accelerated Hypertension:  Will try to transition from IV to PO labetalol.     UDS was negative for cocaine   2. 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 Creatinine has improved.   3.  Hypokalemia:  Will supplement   Disposition:  Length of Stay: 2  Alvia GrovePhilip J. Nahser, Jr., MD, [facility] 09/10/2014, 9:49 AM Office [phone] Pager [phone]

## 2014-09-10 NOTE — Progress Notes (Signed)
*  PRELIMINARY RESULTS* Echocardiogram 2D Echocardiogram has been performed.  Jeryl Columbia 09/10/2014, 7:06 AM

## 2014-09-11 DIAGNOSIS — I5022 Chronic systolic (congestive) heart failure: Secondary | ICD-10-CM

## 2014-09-11 DIAGNOSIS — I509 Heart failure, unspecified: Secondary | ICD-10-CM

## 2014-09-11 DIAGNOSIS — F1410 Cocaine abuse, uncomplicated: Secondary | ICD-10-CM

## 2014-09-11 DIAGNOSIS — N183 Chronic kidney disease, stage 3 (moderate): Secondary | ICD-10-CM

## 2014-09-11 LAB — BASIC METABOLIC PANEL
ANION GAP: 16 — AB (ref 5–15)
BUN: 29 mg/dL — ABNORMAL HIGH (ref 6–23)
CALCIUM: 9 mg/dL (ref 8.4–10.5)
CO2: 25 mEq/L (ref 19–32)
CREATININE: 1.94 mg/dL — AB (ref 0.50–1.35)
Chloride: 102 mEq/L (ref 96–112)
GFR calc non Af Amer: 39 mL/min — ABNORMAL LOW (ref >90)
GFR, EST AFRICAN AMERICAN: 45 mL/min — AB (ref >90)
Glucose, Bld: 111 mg/dL — ABNORMAL HIGH (ref 70–99)
Potassium: 3.7 mEq/L (ref 3.7–5.3)
SODIUM: 143 meq/L (ref 137–147)

## 2014-09-11 MED ORDER — FUROSEMIDE 80 MG PO TABS: 80.0000 mg | ORAL_TABLET | Freq: Every day | ORAL | Status: DC

## 2014-09-11 MED ORDER — HYDRALAZINE HCL 10 MG PO TABS
10.0000 mg | ORAL_TABLET | Freq: Three times a day (TID) | ORAL | Status: DC
  Filled 2014-09-11 (×3): qty 1

## 2014-09-11 MED ORDER — BENAZEPRIL HCL 40 MG PO TABS: 40.0000 mg | ORAL_TABLET | Freq: Every day | ORAL | Status: DC

## 2014-09-11 MED ORDER — FUROSEMIDE 80 MG PO TABS
80.0000 mg | ORAL_TABLET | Freq: Every day | ORAL | Status: DC
  Filled 2014-09-11: qty 1

## 2014-09-11 MED ORDER — CLONIDINE HCL 0.1 MG PO TABS
0.1000 mg | ORAL_TABLET | Freq: Three times a day (TID) | ORAL | Status: DC
  Filled 2014-09-11 (×3): qty 1

## 2014-09-11 MED ORDER — HYDRALAZINE HCL 10 MG PO TABS: 10.0000 mg | ORAL_TABLET | Freq: Three times a day (TID) | ORAL | Status: DC

## 2014-09-11 MED ORDER — PERFLUTREN LIPID MICROSPHERE
1.0000 mL | INTRAVENOUS | Status: AC | PRN
  Administered 2014-09-11: 1.5 mL via INTRAVENOUS
  Filled 2014-09-11: qty 10

## 2014-09-11 MED ORDER — LABETALOL HCL 300 MG PO TABS: 300.0000 mg | ORAL_TABLET | Freq: Three times a day (TID) | ORAL | Status: DC

## 2014-09-11 MED ORDER — POTASSIUM CHLORIDE CRYS ER 20 MEQ PO TBCR: 20.0000 meq | EXTENDED_RELEASE_TABLET | Freq: Two times a day (BID) | ORAL | Status: DC

## 2014-09-11 MED ORDER — AMLODIPINE BESYLATE 10 MG PO TABS: 10.0000 mg | ORAL_TABLET | Freq: Every day | ORAL | Status: DC

## 2014-09-11 NOTE — Progress Notes (Signed)
Pt's Right FA PIV is edematous and tender to the touch. PIV removed using aseptic technique. Pt States that he wants to forego PIV restart tonight due to pain and swelling in right and left UE's. Pt instructed that nurse will restart in early am.

## 2014-09-11 NOTE — Progress Notes (Signed)
Patient does not want IV restarted this morning.Patient stated,"My arms are sore.I don't think I need it right now .I'll talk to my doctor about it this morning.I think I'm going home today ." Will report off to next shift to readdress need for IV with MD.

## 2014-09-11 NOTE — Discharge Summary (Signed)
Physician Discharge Summary     Cardiologist:  Crenshaw  Patient ID: [name] MRN: [id number] DOB/AGE: [birth year] 48 y.o.  Admit date: 09/08/2014 Discharge date: 09/11/2014  Admission Diagnoses:  Hypertensive Urgency   Discharge Diagnoses:  Active Problems:   Hypertensive urgency   CAD   Acute on Chronic systolic CHF   CKD, 2   Hypokalemia   Discharged Condition: stable  Hospital Course:   The patient has a history of OSA, hypertensive urgency, substance abuse and medical non adherence. He has nonobstructive CAD with his last cath in 4/15. 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 However he was managed medically. His last echo showed an EF of 45 - 50%. He has canceled and no showed for two cardiology office appointments since his cath in April.   He now presents with acute on chronic dyspnea and hypertensive urgency with an initial BP of 206/131. Respiratory rate was 21. He was treated with 20 mg of IV labetalol and NTG infusion has been started. He does have CKD but creat was actually lower than previous (1.64 on presentation). Troponin is slightly elevated. ProBNP is 1623.   He reports that his dyspnea started a few weeks ago. However it got worse in the last couple of days. He says he takes his meds. However, he says he has been out of his ACE inhibitor and I don't see a beta blocker on the list. He watches his salt but does not weight himself daily. He does have chest pain when he walks. He says this is new but he also reported this in the spring when he had his cath. He does not have pain at rest. He does not have a BP cuff. He has had PND and orthopnea in the last one to two days. He has had no fevers chills or cough.  He was admitted and started on IV NTG.  Previous meds were restarted.  He was given one dose of 40mg  IV lasix in the ER and then it was increased to 80mg  IV twice daily for two days.  He will be discharged on 80mg  daily.  Potassium was replaced.   Because of a headache, the NTG was stopped and IV labetalol started.  This was later changed to PO.  UDS was negative.  Echocardiogram revealed mod to severely reduced EF however, the acoustic windows were very poor.  I limited contrast echo revealed global LV dysfunction, EF 30%, not apical clot.  Hydralazine was added for better BP control and titrated up as needed.  Consider adding Imdur and or aldactone at a later time.  He is on an ACE-I.  The patient was seen by Dr. Elease Hashimoto who felt he was stable for DC home.  He has been referred to Int. Medicine clinic at [facility] and encouraged him to call for Apt.     Consults: none  Significant Diagnostic Studies:  Echocardiogram  Study Conclusions  - Left ventricle: Poor acoustic windows Endocardium is difficult to visualize. Ovreall, LVEF appears to be moderate to severely depressed with diffuse hypokinesis. Echobright area at apex cannot exclude thrombus Would get lmiited echo with echocontrast to define. The cavity size was normal. Wall thickness was increased in a pattern of mild LVH. - Pericardium, extracardiac: A trivial pericardial effusion was identified.  Limited contrast echo This limited contrast study is to follow up study of 09/09/2014. Purpose is to assess LV motion better and rule out apical clot. The images show global LV dysfunction.  The EF is 30. There is no definite apical clot.   PORTABLE CHEST - 1 VIEW  COMPARISON: 01/24/2014  FINDINGS:  Prominent interstitial lung markings in the mid and lower lung  regions. Prominent perihilar structures. Heart size is upper limits  of normal. Negative for pneumothorax.  IMPRESSION:  Prominent central vascular structures with enlarged interstitial  markings. Findings suggest mild pulmonary edema.   Treatments: See above  Discharge Exam: Blood pressure 145/82, pulse 70, temperature 98.3 F (36.8 C), temperature source Oral, resp. rate 21, height 5\' 7"  (1.702 m), weight 216  lb 14.9 oz (98.4 kg), SpO2 99.00%.   Disposition: 01-Home or Self Care     Medication List         amLODipine 10 MG tablet  Commonly known as:  NORVASC  Take 1 tablet (10 mg total) by mouth daily.     aspirin EC 81 MG tablet  Take 1 tablet (81 mg total) by mouth daily.     benazepril 40 MG tablet  Commonly known as:  LOTENSIN  Take 1 tablet (40 mg total) by mouth daily.     furosemide 80 MG tablet  Commonly known as:  LASIX  Take 1 tablet (80 mg total) by mouth daily.     hydrALAZINE 10 MG tablet  Commonly known as:  APRESOLINE  Take 1 tablet (10 mg total) by mouth every 8 (eight) hours.     labetalol 300 MG tablet  Commonly known as:  NORMODYNE  Take 1 tablet (300 mg total) by mouth 3 (three) times daily.     lovastatin 20 MG tablet  Commonly known as:  MEVACOR  Take 2 tablets (40 mg total) by mouth at bedtime.     nitroGLYCERIN 0.4 MG SL tablet  Commonly known as:  NITROSTAT  Place 1 tablet (0.4 mg total) under the tongue every 5 (five) minutes as needed. For chest pain     potassium chloride SA 20 MEQ tablet  Commonly known as:  K-DUR,KLOR-CON  Take 1 tablet (20 mEq total) by mouth 2 (two) times daily.       Follow-up Information   Follow up with Abelino DerrickKILROY,LUKE K, PA-C On 09/26/2014. (11:30 AM)    Specialty:  Cardiology   Contact information:   [address] [place] [address] [phone]      Greater than 30 minutes was spent completing the patient's discharge.   SignedWilburt Finlay: HAGER, BRYAN, PAC 09/11/2014, 3:21 PM  Attending Note:   The patient was seen and examined.  Agree with assessment and plan as noted above.  Changes made to the above note as needed.  See my note from earlier today. Limited echo with contrast shows NO thrombus in the apex.  OK to go home. Follow up with Dr. Jens Somrenshaw BP is still elevated but this can be followed as OP  Vesta MixerPhilip J. Jakylah Bassinger, Montez HagemanJr., MD, [facility] 09/11/2014, 3:27 PM [address],  [address] Office  [phone] Pager [phone]- [phone]

## 2014-09-11 NOTE — Progress Notes (Signed)
Primary cardiologist:  Crenshaw   PROGRESS NOTE  Subjective:   48 yo with hx of HTN Poor compliance Admitted with hypertensive urgency / accelerated HTN   BP is better with the Labetalol  Still elevated. Will add hydralazine  Objective:    Vital Signs:   Temp:  [97.6 F (36.4 C)-98.3 F (36.8 C)] 97.9 F (36.6 C) (10/21 0821) Pulse Rate:  [72-76] 72 (10/21 0821) Resp:  [14-32] 17 (10/21 0821) BP: (122-169)/(59-101) 169/101 mmHg (10/21 0821) SpO2:  [97 %-100 %] 98 % (10/21 0821) Weight:  [216 lb 14.9 oz (98.4 kg)] 216 lb 14.9 oz (98.4 kg) (10/21 0300)  Last BM Date: 09/09/14   24-hour weight change: Weight change: 1 lb 8.7 oz (0.7 kg)  Weight trends: Filed Weights   09/09/14 0400 09/10/14 0424 09/11/14 0300  Weight: 218 lb 14.7 oz (99.3 kg) 215 lb 6.2 oz (97.7 kg) 216 lb 14.9 oz (98.4 kg)    Intake/Output:  10/20 0701 - 10/21 0700 In: 760 [P.O.:700; I.V.:60] Out: 850 [Urine:850] Total I/O In: 240 [P.O.:240] Out: -    Physical Exam: BP 169/101  Pulse 72  Temp(Src) 97.9 F (36.6 C) (Oral)  Resp 17  Ht 5\' 7"  (1.702 m)  Wt 216 lb 14.9 oz (98.4 kg)  BMI 33.97 kg/m2  SpO2 98%  Wt Readings from Last 3 Encounters:  09/11/14 216 lb 14.9 oz (98.4 kg)  02/21/14 200 lb (90.719 kg)  02/21/14 200 lb (90.719 kg)    General: Vital signs reviewed and noted.   Head: Normocephalic, atraumatic.  Eyes: conjunctivae/corneas clear.  EOM's intact.   Throat: normal  Neck:  normal   Lungs:  clear   Heart:  RR  Abdomen:  Soft, non-tender, non-distended    Extremities: Trace edema   Neurologic: A&O X3, CN II - XII are grossly intact.   Psych: Normal     Labs: BMET:  Recent Labs  09/10/14 0237 09/11/14 0238  NA 137 143  K 3.2* 3.7  CL 96 102  CO2 25 25  GLUCOSE 107* 111*  BUN 26* 29*  CREATININE 1.81* 1.94*  CALCIUM 9.0 9.0    Liver function tests: No results found for this basename: AST, ALT, ALKPHOS, BILITOT, PROT, ALBUMIN,  in the last 72  hours No results found for this basename: LIPASE, AMYLASE,  in the last 72 hours  CBC:  Recent Labs  09/08/14 1555  WBC 5.3  HGB 12.5*  HCT 37.1*  MCV 88.3  PLT 271    Cardiac Enzymes:  Recent Labs  09/08/14 1555 09/08/14 2022 09/09/14 0303  TROPONINI <0.30 0.32* <0.30    Coagulation Studies: No results found for this basename: LABPROT, INR,  in the last 72 hours   Recent Labs  09/08/14 1555  TSH 0.875   No results found for this basename: VITAMINB12, FOLATE, FERRITIN, TIBC, IRON, RETICCTPCT,  in the last 72 hours   Other results:  EKG :    NSR, LVH with repol.  Medications:    Infusions: . labetalol (NORMODYNE) infusion Stopped (09/10/14 1200)    Scheduled Medications: . amLODipine  10 mg Oral Daily  . aspirin EC  81 mg Oral Daily  . benazepril  40 mg Oral Daily  . furosemide  80 mg Intravenous BID  . heparin  5,000 Units Subcutaneous 3 times per day  . labetalol  300 mg Oral TID  . potassium chloride  20 mEq Oral BID  . pravastatin  40 mg Oral q1800  . sodium  chloride  3 mL Intravenous Q12H    Assessment/ Plan:   Active Problems:  1.   Accelerated Hypertension:   PO labetalol 300 TID Amlodipine 10 a day Benazepril 40 a day Hydralazine 10  TID, titrate up as needed Lasix 80 a day kdur 20 BID Can add Imdur at a later time Can also consider adding Aldactone if he shows that he will show up for return apts.   2. CAD:  Has moderate - severe CAD by cath in April , 2015.  Medical therapy recommended, no angina  3.chronic systolic CHF:  Known.   On beta blocker, ACE-I, Lasix,  Starting Hydralazine Can add Imdur as OP Can add Aldactone if he is compliant with office visits   UDS was negative for cocaine   2. 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 Creatinine has improved.     3.  Hypokalemia:  Will supplement    He has been referred to Int. Medicine clinic at [facility].  I have encouraged him to call for Apt. Will  follow up with [facility]. [name] at [facility].   Disposition:  DC to home   Length of Stay: 3  Vesta Mixer, Montez Hageman., MD, [facility] 09/11/2014, 10:26 AM Office [phone] Pager [phone]

## 2014-09-11 NOTE — Progress Notes (Signed)
  Echocardiogram 2D Echocardiogram has been performed.  Arvil Chaco 09/11/2014, 12:31 PM

## 2014-09-26 ENCOUNTER — Ambulatory Visit: Payer: No Typology Code available for payment source | Admitting: Cardiology

## 2014-10-31 ENCOUNTER — Encounter (HOSPITAL_COMMUNITY): Payer: Self-pay | Admitting: Cardiovascular Disease

## 2015-06-22 ENCOUNTER — Other Ambulatory Visit: Payer: Self-pay | Admitting: Physician Assistant

## 2015-07-17 ENCOUNTER — Other Ambulatory Visit: Payer: Self-pay | Admitting: Physician Assistant

## 2015-07-17 NOTE — Telephone Encounter (Signed)
REFILL 

## 2015-09-04 ENCOUNTER — Ambulatory Visit: Payer: No Typology Code available for payment source | Admitting: Internal Medicine

## 2015-09-10 ENCOUNTER — Ambulatory Visit: Payer: No Typology Code available for payment source | Admitting: Internal Medicine

## 2015-12-09 ENCOUNTER — Other Ambulatory Visit: Payer: Self-pay | Admitting: Physician Assistant

## 2015-12-11 ENCOUNTER — Other Ambulatory Visit: Payer: Self-pay | Admitting: Physician Assistant

## 2015-12-15 ENCOUNTER — Encounter (HOSPITAL_COMMUNITY): Payer: Self-pay

## 2015-12-15 ENCOUNTER — Inpatient Hospital Stay (HOSPITAL_COMMUNITY)
Admission: EM | Admit: 2015-12-15 | Discharge: 2015-12-23 | DRG: 286 | Disposition: A | Discharge status: Home / Self Care | Payer: Self-pay | Attending: Internal Medicine | Admitting: Internal Medicine

## 2015-12-15 ENCOUNTER — Emergency Department (HOSPITAL_COMMUNITY): Payer: Self-pay

## 2015-12-15 ENCOUNTER — Inpatient Hospital Stay (HOSPITAL_COMMUNITY): Payer: No Typology Code available for payment source

## 2015-12-15 DIAGNOSIS — J810 Acute pulmonary edema: Secondary | ICD-10-CM

## 2015-12-15 DIAGNOSIS — I2510 Atherosclerotic heart disease of native coronary artery without angina pectoris: Secondary | ICD-10-CM

## 2015-12-15 DIAGNOSIS — Z91013 Allergy to seafood: Secondary | ICD-10-CM

## 2015-12-15 DIAGNOSIS — K761 Chronic passive congestion of liver: Secondary | ICD-10-CM | POA: Diagnosis present

## 2015-12-15 DIAGNOSIS — H4922 Sixth [abducent] nerve palsy, left eye: Secondary | ICD-10-CM | POA: Diagnosis present

## 2015-12-15 DIAGNOSIS — N183 Chronic kidney disease, stage 3 unspecified: Secondary | ICD-10-CM | POA: Diagnosis present

## 2015-12-15 DIAGNOSIS — R451 Restlessness and agitation: Secondary | ICD-10-CM | POA: Diagnosis present

## 2015-12-15 DIAGNOSIS — I161 Hypertensive emergency: Principal | ICD-10-CM | POA: Diagnosis present

## 2015-12-15 DIAGNOSIS — R072 Precordial pain: Secondary | ICD-10-CM

## 2015-12-15 DIAGNOSIS — I10 Essential (primary) hypertension: Secondary | ICD-10-CM

## 2015-12-15 DIAGNOSIS — E669 Obesity, unspecified: Secondary | ICD-10-CM | POA: Diagnosis present

## 2015-12-15 DIAGNOSIS — I25110 Atherosclerotic heart disease of native coronary artery with unstable angina pectoris: Secondary | ICD-10-CM | POA: Diagnosis present

## 2015-12-15 DIAGNOSIS — I214 Non-ST elevation (NSTEMI) myocardial infarction: Secondary | ICD-10-CM | POA: Insufficient documentation

## 2015-12-15 DIAGNOSIS — F1410 Cocaine abuse, uncomplicated: Secondary | ICD-10-CM | POA: Diagnosis present

## 2015-12-15 DIAGNOSIS — I5023 Acute on chronic systolic (congestive) heart failure: Secondary | ICD-10-CM | POA: Diagnosis present

## 2015-12-15 DIAGNOSIS — Z7982 Long term (current) use of aspirin: Secondary | ICD-10-CM

## 2015-12-15 DIAGNOSIS — I509 Heart failure, unspecified: Secondary | ICD-10-CM

## 2015-12-15 DIAGNOSIS — Z8249 Family history of ischemic heart disease and other diseases of the circulatory system: Secondary | ICD-10-CM

## 2015-12-15 DIAGNOSIS — G4733 Obstructive sleep apnea (adult) (pediatric): Secondary | ICD-10-CM | POA: Diagnosis present

## 2015-12-15 DIAGNOSIS — R0602 Shortness of breath: Secondary | ICD-10-CM

## 2015-12-15 DIAGNOSIS — R079 Chest pain, unspecified: Secondary | ICD-10-CM | POA: Insufficient documentation

## 2015-12-15 DIAGNOSIS — R778 Other specified abnormalities of plasma proteins: Secondary | ICD-10-CM | POA: Diagnosis present

## 2015-12-15 DIAGNOSIS — I129 Hypertensive chronic kidney disease with stage 1 through stage 4 chronic kidney disease, or unspecified chronic kidney disease: Secondary | ICD-10-CM | POA: Diagnosis present

## 2015-12-15 DIAGNOSIS — J811 Chronic pulmonary edema: Secondary | ICD-10-CM | POA: Diagnosis present

## 2015-12-15 DIAGNOSIS — Z9119 Patient's noncompliance with other medical treatment and regimen: Secondary | ICD-10-CM

## 2015-12-15 DIAGNOSIS — E876 Hypokalemia: Secondary | ICD-10-CM | POA: Diagnosis present

## 2015-12-15 DIAGNOSIS — H532 Diplopia: Secondary | ICD-10-CM | POA: Diagnosis present

## 2015-12-15 DIAGNOSIS — R7989 Other specified abnormal findings of blood chemistry: Secondary | ICD-10-CM

## 2015-12-15 DIAGNOSIS — J9601 Acute respiratory failure with hypoxia: Secondary | ICD-10-CM | POA: Diagnosis present

## 2015-12-15 DIAGNOSIS — I429 Cardiomyopathy, unspecified: Secondary | ICD-10-CM | POA: Diagnosis present

## 2015-12-15 DIAGNOSIS — R9439 Abnormal result of other cardiovascular function study: Secondary | ICD-10-CM | POA: Insufficient documentation

## 2015-12-15 DIAGNOSIS — G379 Demyelinating disease of central nervous system, unspecified: Secondary | ICD-10-CM

## 2015-12-15 DIAGNOSIS — K759 Inflammatory liver disease, unspecified: Secondary | ICD-10-CM | POA: Diagnosis present

## 2015-12-15 DIAGNOSIS — Z880 Allergy status to penicillin: Secondary | ICD-10-CM

## 2015-12-15 DIAGNOSIS — Z79899 Other long term (current) drug therapy: Secondary | ICD-10-CM

## 2015-12-15 DIAGNOSIS — H4920 Sixth [abducent] nerve palsy, unspecified eye: Secondary | ICD-10-CM | POA: Insufficient documentation

## 2015-12-15 DIAGNOSIS — I5043 Acute on chronic combined systolic (congestive) and diastolic (congestive) heart failure: Secondary | ICD-10-CM | POA: Diagnosis present

## 2015-12-15 DIAGNOSIS — F17210 Nicotine dependence, cigarettes, uncomplicated: Secondary | ICD-10-CM | POA: Diagnosis present

## 2015-12-15 LAB — CBC WITH DIFFERENTIAL/PLATELET
Basophils Absolute: 0 10*3/uL (ref 0.0–0.1)
Basophils Relative: 1 %
EOS PCT: 3 %
Eosinophils Absolute: 0.2 10*3/uL (ref 0.0–0.7)
HCT: 40.9 % (ref 39.0–52.0)
HEMOGLOBIN: 14 g/dL (ref 13.0–17.0)
LYMPHS ABS: 2.3 10*3/uL (ref 0.7–4.0)
LYMPHS PCT: 28 %
MCH: 31 pg (ref 26.0–34.0)
MCHC: 34.2 g/dL (ref 30.0–36.0)
MCV: 90.5 fL (ref 78.0–100.0)
Monocytes Absolute: 0.6 10*3/uL (ref 0.1–1.0)
Monocytes Relative: 7 %
NEUTROS PCT: 63 %
Neutro Abs: 5.3 10*3/uL (ref 1.7–7.7)
Platelets: 292 10*3/uL (ref 150–400)
RBC: 4.52 MIL/uL (ref 4.22–5.81)
RDW: 14.3 % (ref 11.5–15.5)
WBC: 8.4 10*3/uL (ref 4.0–10.5)

## 2015-12-15 LAB — I-STAT ARTERIAL BLOOD GAS, ED
ACID-BASE DEFICIT: 1 mmol/L (ref 0.0–2.0)
BICARBONATE: 25.4 meq/L — AB (ref 20.0–24.0)
O2 SAT: 100 %
TCO2: 27 mmol/L (ref 0–100)
pCO2 arterial: 48.9 mmHg — ABNORMAL HIGH (ref 35.0–45.0)
pH, Arterial: 7.324 — ABNORMAL LOW (ref 7.350–7.450)
pO2, Arterial: 467 mmHg — ABNORMAL HIGH (ref 80.0–100.0)

## 2015-12-15 LAB — RAPID URINE DRUG SCREEN, HOSP PERFORMED
AMPHETAMINES: NOT DETECTED
Barbiturates: NOT DETECTED
Benzodiazepines: NOT DETECTED
COCAINE: POSITIVE — AB
OPIATES: NOT DETECTED
TETRAHYDROCANNABINOL: NOT DETECTED

## 2015-12-15 LAB — COMPREHENSIVE METABOLIC PANEL
ALK PHOS: 65 U/L (ref 38–126)
ALT: 35 U/L (ref 17–63)
AST: 63 U/L — AB (ref 15–41)
Albumin: 3.6 g/dL (ref 3.5–5.0)
Anion gap: 13 (ref 5–15)
BILIRUBIN TOTAL: 0.8 mg/dL (ref 0.3–1.2)
BUN: 25 mg/dL — AB (ref 6–20)
CO2: 23 mmol/L (ref 22–32)
CREATININE: 1.87 mg/dL — AB (ref 0.61–1.24)
Calcium: 9 mg/dL (ref 8.9–10.3)
Chloride: 103 mmol/L (ref 101–111)
GFR calc Af Amer: 47 mL/min — ABNORMAL LOW (ref >60)
GFR, EST NON AFRICAN AMERICAN: 40 mL/min — AB (ref >60)
Glucose, Bld: 123 mg/dL — ABNORMAL HIGH (ref 65–99)
Potassium: 3.7 mmol/L (ref 3.5–5.1)
Sodium: 139 mmol/L (ref 135–145)
TOTAL PROTEIN: 7.4 g/dL (ref 6.5–8.1)

## 2015-12-15 LAB — BRAIN NATRIURETIC PEPTIDE: B Natriuretic Peptide: 683.3 pg/mL — ABNORMAL HIGH (ref 0.0–100.0)

## 2015-12-15 LAB — TROPONIN I
TROPONIN I: 0.31 ng/mL — AB (ref <0.031)
Troponin I: 0.24 ng/mL — ABNORMAL HIGH (ref <0.031)
Troponin I: 0.22 ng/mL — ABNORMAL HIGH (ref <0.031)

## 2015-12-15 LAB — CBC
HCT: 39.1 % (ref 39.0–52.0)
Hemoglobin: 13.3 g/dL (ref 13.0–17.0)
MCH: 30.8 pg (ref 26.0–34.0)
MCHC: 34 g/dL (ref 30.0–36.0)
MCV: 90.5 fL (ref 78.0–100.0)
PLATELETS: 251 10*3/uL (ref 150–400)
RBC: 4.32 MIL/uL (ref 4.22–5.81)
RDW: 14.4 % (ref 11.5–15.5)
WBC: 10 10*3/uL (ref 4.0–10.5)

## 2015-12-15 LAB — CREATININE, SERUM
CREATININE: 2.08 mg/dL — AB (ref 0.61–1.24)
GFR calc Af Amer: 41 mL/min — ABNORMAL LOW (ref >60)
GFR calc non Af Amer: 35 mL/min — ABNORMAL LOW (ref >60)

## 2015-12-15 LAB — I-STAT TROPONIN, ED: Troponin i, poc: 0.15 ng/mL (ref 0.00–0.08)

## 2015-12-15 LAB — MRSA PCR SCREENING: MRSA by PCR: NEGATIVE

## 2015-12-15 MED ORDER — HYDRALAZINE HCL 20 MG/ML IJ SOLN
10.0000 mg | Freq: Once | INTRAMUSCULAR | Status: AC
  Administered 2015-12-15: 10 mg via INTRAVENOUS

## 2015-12-15 MED ORDER — NITROGLYCERIN IN D5W 200-5 MCG/ML-% IV SOLN
INTRAVENOUS | Status: AC
  Filled 2015-12-15: qty 250

## 2015-12-15 MED ORDER — HYDRALAZINE HCL 25 MG PO TABS
25.0000 mg | ORAL_TABLET | Freq: Three times a day (TID) | ORAL | Status: DC
  Administered 2015-12-15 (×2): 25 mg via ORAL
  Filled 2015-12-15 (×2): qty 1

## 2015-12-15 MED ORDER — FUROSEMIDE 10 MG/ML IJ SOLN
40.0000 mg | Freq: Once | INTRAMUSCULAR | Status: AC
  Administered 2015-12-15: 40 mg via INTRAVENOUS
  Filled 2015-12-15: qty 4

## 2015-12-15 MED ORDER — ALBUTEROL SULFATE (2.5 MG/3ML) 0.083% IN NEBU
5.0000 mg | INHALATION_SOLUTION | Freq: Once | RESPIRATORY_TRACT | Status: DC
  Filled 2015-12-15: qty 6

## 2015-12-15 MED ORDER — SODIUM CHLORIDE 0.9 % IJ SOLN
3.0000 mL | Freq: Two times a day (BID) | INTRAMUSCULAR | Status: DC
  Administered 2015-12-15 – 2015-12-21 (×14): 3 mL via INTRAVENOUS

## 2015-12-15 MED ORDER — ACETAMINOPHEN 650 MG RE SUPP: 650.0000 mg | Freq: Four times a day (QID) | RECTAL | Status: DC | PRN

## 2015-12-15 MED ORDER — ENOXAPARIN SODIUM 40 MG/0.4ML ~~LOC~~ SOLN
40.0000 mg | Freq: Every day | SUBCUTANEOUS | Status: DC
  Administered 2015-12-15 – 2015-12-21 (×7): 40 mg via SUBCUTANEOUS
  Filled 2015-12-15 (×7): qty 0.4

## 2015-12-15 MED ORDER — PERFLUTREN LIPID MICROSPHERE
INTRAVENOUS | Status: AC
  Administered 2015-12-15: 08:00:00
  Filled 2015-12-15: qty 10

## 2015-12-15 MED ORDER — ATORVASTATIN CALCIUM 80 MG PO TABS
80.0000 mg | ORAL_TABLET | Freq: Every day | ORAL | Status: DC
  Administered 2015-12-15 – 2015-12-22 (×8): 80 mg via ORAL
  Filled 2015-12-15 (×7): qty 1

## 2015-12-15 MED ORDER — ASPIRIN 325 MG PO TABS
325.0000 mg | ORAL_TABLET | Freq: Every day | ORAL | Status: DC
  Administered 2015-12-15 – 2015-12-20 (×6): 325 mg via ORAL
  Filled 2015-12-15 (×6): qty 1

## 2015-12-15 MED ORDER — NITROGLYCERIN IN D5W 200-5 MCG/ML-% IV SOLN
0.0000 ug/min | Freq: Once | INTRAVENOUS | Status: AC
  Administered 2015-12-15: 10 ug/min via INTRAVENOUS

## 2015-12-15 MED ORDER — NITROGLYCERIN IN D5W 200-5 MCG/ML-% IV SOLN
0.0000 ug/min | INTRAVENOUS | Status: DC
  Administered 2015-12-16: 40 ug/min via INTRAVENOUS
  Filled 2015-12-15: qty 250

## 2015-12-15 MED ORDER — HYDRALAZINE HCL 50 MG PO TABS
50.0000 mg | ORAL_TABLET | Freq: Three times a day (TID) | ORAL | Status: DC
  Administered 2015-12-15 – 2015-12-16 (×2): 50 mg via ORAL
  Filled 2015-12-15 (×2): qty 1

## 2015-12-15 MED ORDER — HYDRALAZINE HCL 20 MG/ML IJ SOLN
INTRAMUSCULAR | Status: AC
  Filled 2015-12-15: qty 1

## 2015-12-15 MED ORDER — ACETAMINOPHEN 325 MG PO TABS
650.0000 mg | ORAL_TABLET | Freq: Four times a day (QID) | ORAL | Status: DC | PRN
  Administered 2015-12-15 – 2015-12-18 (×5): 650 mg via ORAL
  Filled 2015-12-15 (×6): qty 2

## 2015-12-15 MED ORDER — NITROGLYCERIN 2 % TD OINT
1.0000 [in_us] | TOPICAL_OINTMENT | Freq: Once | TRANSDERMAL | Status: AC
  Administered 2015-12-15: 1 [in_us] via TOPICAL
  Filled 2015-12-15: qty 1

## 2015-12-15 MED ORDER — FUROSEMIDE 10 MG/ML IJ SOLN
80.0000 mg | Freq: Two times a day (BID) | INTRAMUSCULAR | Status: DC
  Administered 2015-12-15 (×2): 80 mg via INTRAVENOUS
  Filled 2015-12-15 (×2): qty 8

## 2015-12-15 NOTE — H&P (Signed)
History and Physical  Patient Name: [name]     JXB:[id number]    DOB: [birth year]    DOA: 12/15/2015 Referring physician: Emily Filbert, MD PCP: No PCP Per Patient      Chief Complaint: Dyspnea  HPI: [name] is a 50 y.o. male with a past medical history significant for NICM with EF 30%, HTN, and CKD without regular medical follow up who presents with dyspnea.  The patient reports to me that he has had about 4 months of increasing dyspnea on exertion. This is also is accompanied by leg pain and a sharp chest pain that goes away with rest.  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 Now, in the last several weeks he has had worsening orthopnea, paroxysmal nocturnal dyspnea (waking up at night with dyspnea the last for about 10 minutes and goes away when he sits up and uses his wife's inhaler), ankle and wrist swelling, and then tonight he woke up with the dyspnea and it just wouldn't go away so he came to the ER.  In the ED, the patient was initially hypertensive to 265/156 mmHg.  Hip pulmonary edema on chest x-ray, evidence of myocardial necrosis, elevated BNP, and was restless. In the ER, there was initially concern that he may have to be intubated because of restlessness, but his breathing improved with BiPAP, nitroglycerin gtt was started and his blood pressure was brought down to 200/125.  The case was discussed with PCCM, and TRH were asked to evaluate for admission.  The patient currently does not have a PCP, he does not take any of his medicines. He does not smoke. He denies cocaine use.   Review of Systems:  Pt complains of dyspnea, dyspnea on exertion, sharp chest pain, leg pain, paroxysmal nocturnal dyspnea, orthopnea, ankle swelling. Pt denies any fever, confusion, seizures, passing out.  All other systems negative except as just noted or noted in the history of present illness.  Allergies  Allergen Reactions  . Penicillins Other (See Comments)   Patient states that it makes his throat itch really bad, and feels like it is closing up.   . Shrimp [Shellfish Allergy]     Throat swells    Prior to Admission medications   Medication Sig Start Date End Date Taking? Authorizing Provider  amLODipine (NORVASC) 10 MG tablet Take 1 tablet (10 mg total) by mouth daily. 09/11/14  Yes Dwana Melena, PA-C  aspirin EC 81 MG tablet Take 1 tablet (81 mg total) by mouth daily. 05/14/13  Yes Lewayne Bunting, MD  benazepril (LOTENSIN) 40 MG tablet TAKE ONE TABLET BY MOUTH DAILY 06/24/15  Yes Dwana Melena, PA-C  furosemide (LASIX) 80 MG tablet Take 1 tablet (80 mg total) by mouth daily. NEED OV. 07/17/15  Yes Dwana Melena, PA-C  hydrALAZINE (APRESOLINE) 10 MG tablet Take 1 tablet (10 mg total) by mouth every 8 (eight) hours. 09/11/14  Yes Dwana Melena, PA-C  Multiple Vitamin (MULTIVITAMIN WITH MINERALS) TABS tablet Take 1 tablet by mouth daily.   Yes Historical Provider, MD  nicotine (NICODERM CQ - DOSED IN MG/24 HOURS) 21 mg/24hr patch Place 21 mg onto the skin daily.   Yes Historical Provider, MD  nitroGLYCERIN (NITROSTAT) 0.4 MG SL tablet Place 1 tablet (0.4 mg total) under the tongue every 5 (five) minutes as needed. For chest pain 08/10/12  Yes Herby Abraham, MD  potassium chloride SA (K-DUR,KLOR-CON) 20 MEQ tablet Take  1 tablet (20 mEq total) by mouth 2 (two) times daily. 09/11/14  Yes Dwana Melena, PA-C    Past Medical History  Diagnosis Date  . CAD (coronary artery disease)     Small vessel with occluded small OM and nonobstructive large vessel disease (cath 02/2014)  . Obesity   . History of syncope 08/2008    Secondary to hypertension  . Tobacco abuse   . Hypertension, essential, benign     a. renal art [place] (6/14): no RAS  . Noncompliance   . CKD (chronic kidney disease)   . Nonischemic cardiomyopathy (HCC)     EF 45%  . Sleep apnea     Past Surgical History  Procedure Laterality Date  . None    . Left heart catheterization with  coronary angiogram N/A 02/21/2014    Procedure: LEFT HEART CATHETERIZATION WITH CORONARY ANGIOGRAM;  Surgeon: Kathleene Hazel, MD;  Location: [facility] CATH LAB;  Service: Cardiovascular;  Laterality: N/A;    Family history: family history includes Heart disease in his maternal grandfather; Lupus in his mother. There is no history of Coronary artery disease.  Social History: Patient lives with his girlfriend. He tells me that he quit smoking, although it appears that he reported initially that he does smoke. 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 He is lives in [place]. He does not take alcohol.       Physical Exam: BP 190/116 mmHg  Pulse 96  Temp(Src) 97.4 F (36.3 C) (Oral)  Resp 23  Ht 5\' 7"  (1.702 m)  Wt 97.523 kg (215 lb)  BMI 33.67 kg/m2  SpO2 100% General appearance: Well-developed, obese adult male, alert and in mild distress on BiPAP.   Eyes: Anicteric, conjunctiva red, lids and lashes normal.     ENT: No nasal deformity, discharge, or epistaxis.  OP moist without lesions.   Skin: Warm and dry.   Cardiac: RRR, nl S1-S2, no clear murmurs, ?S4.  Capillary refill is brisk.  No distended neck veins, no pitting LE edema.  Radial pulses 2+ and symmetric.  DP pulses hard to palpate. Respiratory: Normal respiratory rate and rhythm.  Bilateral rales. Abdomen: Abdomen soft without rigidity.  No TTP. No ascites, distension.   MSK: No deformities or effusions. Neuro: Sensorium intact and responding to questions, attention normal.  Speech is fluent.  Moves all extremities equally and with normal coordination.    Psych: Behavior appropriate.  Affect normal.  No evidence of aural or visual hallucinations or delusions.       Labs on Admission:  The metabolic panel shows normal sodium, potassium, bicarbonate. The serum creatinine is 1.87 mg/dL which is close to the patient's baseline, which over the last year appears to be between 1.6 to  2.0 mg/dL. AST is slightly above the upper lower normal at 63 U/L. Troponin is slightly elevated at 0.15 ng per mL. The BNP is elevated at 683 pg per mL. The complete blood count shows no leukocytosis or anemia or thrombocytopenia.   Radiological Exams on Admission: Personally reviewed: Dg Chest Port 1 View 12/15/2015   Pulmonary edema.  EKG: Independently reviewed. Sinus rhythm, rate 104, QTC 484. LVH with early repol pattern and T-wave inversions in lateral leads.    Assessment/Plan 1. Hypertensive emergency:  -Nitroglycerin gtt for now -Titrate to achieve BP 190-210 mmHg systolic (25-30% reduction from presentation) -Admit to Stepdown unit   2. Acute on chronic systolic CHF:  Last  EF was in 2015, EF 30%.  Not on medicine currently. -Repeat echocardiogram -BP control as above -Furosemide 40 mg IV at noon -Strict I/O and daily weights  3. Elevated troponin:  Due to #1 and 2. -Trend troponin  4. CKD stage III:  Stable, baseline appears to be ~1.6-2.0 in the last few years.  5. Hepatitis:  Minimal, likely due to #1.  6. Pulmonary edema:  Due to #1 and 2 -Bipap PRN    DVT PPx: Lovenox Diet: Heart healthy Consultants: Cardiology Code Status: Full Family Communication: None  Medical decision making: What exists of the patient's previous chart was reviewed in depth and the case was discussed with Dr. Dellie Catholic and Dr. Judd Lien. Patient seen 5:30 AM on 12/15/2015.  Disposition Plan:  I recommend admission to Stepdown.  Clinical condition: unstable.  Anticipate admission for 5-7 days.      Alberteen Sam [facility] Pager [phone]

## 2015-12-15 NOTE — ED Notes (Signed)
MD at bedside. 

## 2015-12-15 NOTE — Progress Notes (Signed)
[facility] PROGRESS NOTE   KHAIL ZAMAN QIH:[id number] DOB: 11/23/1965 DOA: 12/15/2015 PCP: No PCP Per Patient  HPI/Subjective: Patient is on BiPAP, seen while he was getting his 2-D echo done. Less shortness of breath. Started on nitroglycerin and Lasix.  Please note this is a no charge note, patient seen earlier today by my colleague Dr. Maryfrances Bunnell. Data base reviewed, hypertensive urgency and acute on chronic CHF  Assessment/Plan: Principal Problem:   Hypertensive emergency Active Problems:   Elevated troponin   Pulmonary edema   CKD (chronic kidney disease), stage III   Hepatitis   1. Hypertensive emergency:  -Nitroglycerin gtt for now -Titrate to achieve BP 190-210 mmHg systolic (25-30% reduction from presentation) -Admit to Stepdown unit   2. Acute on chronic systolic CHF:  Last EF was in 8756, EF 30%. Not on medicine currently. -Repeat echocardiogram -BP control as above -Furosemide 40 mg IV at noon -Strict I/O and daily weights  3. Elevated troponin:  Due to #1 and 2. -Trend troponin  4. CKD stage III:  Stable, baseline appears to be ~1.6-2.0 in the last few years.  5. Hepatitis:  Minimal, likely due to #1.  6. Pulmonary edema:  Due to #1 and 2 -Bipap PRN   Code Status: Full Code Family Communication: Plan discussed with the patient. Disposition Plan: Remains inpatient Diet: Diet Heart Room service appropriate?: Yes; Fluid consistency:: Thin  Consultants:  Cardiology  Procedures:  2-D echo pending  Antibiotics:  None   Objective: Filed Vitals:   12/15/15 0800 12/15/15 0910  BP: 186/122 188/120  Pulse: 89 88  Temp: 97.4 F (36.3 C)   Resp: 22 22    Intake/Output Summary (Last 24 hours) at 12/15/15 1054 Last data filed at 12/15/15 0842  Gross per 24 hour  Intake  26.35 ml  Output    725 ml  Net -698.65 ml   Filed Weights   12/15/15 0330  Weight: 97.523 kg (215 lb)    Exam: General: Alert and awake,  oriented x3, not in any acute distress. HEENT: anicteric sclera, pupils reactive to light and accommodation, EOMI CVS: S1-S2 clear, no murmur rubs or gallops Chest: clear to auscultation bilaterally, no wheezing, rales or rhonchi Abdomen: soft nontender, nondistended, normal bowel sounds, no organomegaly Extremities: no cyanosis, clubbing or edema noted bilaterally Neuro: Cranial nerves II-XII intact, no focal neurological deficits  Data Reviewed: Basic Metabolic Panel:  Recent Labs Lab 12/15/15 0400  NA 139  K 3.7  CL 103  CO2 23  GLUCOSE 123*  BUN 25*  CREATININE 1.87*  CALCIUM 9.0   Liver Function Tests:  Recent Labs Lab 12/15/15 0400  AST 63*  ALT 35  ALKPHOS 65  BILITOT 0.8  PROT 7.4  ALBUMIN 3.6   No results for input(s): LIPASE, AMYLASE in the last 168 hours. No results for input(s): AMMONIA in the last 168 hours. CBC:  Recent Labs Lab 12/15/15 0400  WBC 8.4  NEUTROABS 5.3  HGB 14.0  HCT 40.9  MCV 90.5  PLT 292   Cardiac Enzymes: No results for input(s): CKTOTAL, CKMB, CKMBINDEX, TROPONINI in the last 168 hours. BNP (last 3 results)  Recent Labs  12/15/15 0400  BNP 683.3*    ProBNP (last 3 results) No results for input(s): PROBNP in the last 8760 hours.  CBG: No results for input(s): GLUCAP in the last 168 hours.  Micro No results found for this or any previous visit (from the past 240 hour(s)).   Studies: Dg Chest Port 1  View  12/15/2015  CLINICAL DATA:  Acute onset of respiratory distress. Initial encounter. EXAM: PORTABLE CHEST 1 VIEW COMPARISON:  Chest radiograph performed 09/08/2014 FINDINGS: The lungs are well-aerated. Vascular congestion is noted. Mildly increased interstitial markings may reflect minimal interstitial edema. There is no evidence of pleural effusion or pneumothorax. The cardiomediastinal silhouette is enlarged. No acute osseous abnormalities are seen. IMPRESSION: Vascular congestion and cardiomegaly. Mildly increased  interstitial markings may reflect minimal interstitial edema. Electronically Signed   By: Roanna Raider M.D.   On: 12/15/2015 04:26    Scheduled Meds: . aspirin  325 mg Oral Daily  . atorvastatin  80 mg Oral q1800  . enoxaparin (LOVENOX) injection  40 mg Subcutaneous Daily  . furosemide  40 mg Intravenous Once  . furosemide  80 mg Intravenous BID  . hydrALAZINE  25 mg Oral 3 times per day  . nitroGLYCERIN      . sodium chloride  3 mL Intravenous Q12H   Continuous Infusions: . nitroGLYCERIN 30 mcg/min (12/15/15 0842)       Time spent: 35 minutes    [facility]  [facility] Pager [phone] If 7PM-7AM, please contact night-coverage at [email or web address], password [facility] 12/15/2015, 10:54 AM  LOS: 0 days

## 2015-12-15 NOTE — Consult Note (Addendum)
Primary cardiologist: Dr Stanford Breed  HPI: 50 year old male with past medical history of noncompliance, hypertension, nonischemic cardiomyopathy, renal insufficiency for evaluation of acute on chronic systolic congestive heart failure, hypertension and elevated troponin. Last catheterization in April 2015 showed a 20% left main 40% proximal LAD and 50% mid. There was an occluded first obtuse marginal but filled from left to left collaterals. There was a 60% second obtuse marginal. The distal AV circumflex had a 70% lesion. There was a 60% proximal right coronary artery. Medical therapy recommended. Last echocardiogram October 2015 showed an ejection fraction of 30%. Patient states that for the past 3 months he has had progressive dyspnea on exertion and orthopnea. There has been PND but he denies pedal edema. He has had chest pain with exertion relieved with rest. It is a burning sensation associated with dyspnea. No nausea. He does not have the symptoms at rest. His dyspnea became so severe he was admitted last evening. He is presently on BiPAP. Cardiology asked to evaluate.  Medications Prior to Admission  Medication Sig Dispense Refill  . amLODipine (NORVASC) 10 MG tablet Take 1 tablet (10 mg total) by mouth daily. 30 tablet 12  . aspirin EC 81 MG tablet Take 1 tablet (81 mg total) by mouth daily. 90 tablet 3  . benazepril (LOTENSIN) 40 MG tablet TAKE ONE TABLET BY MOUTH DAILY 30 tablet 1  . furosemide (LASIX) 80 MG tablet Take 1 tablet (80 mg total) by mouth daily. NEED OV. 30 tablet 0  . hydrALAZINE (APRESOLINE) 10 MG tablet Take 1 tablet (10 mg total) by mouth every 8 (eight) hours. 90 tablet 3  . Multiple Vitamin (MULTIVITAMIN WITH MINERALS) TABS tablet Take 1 tablet by mouth daily.    . nicotine (NICODERM CQ - DOSED IN MG/24 HOURS) 21 mg/24hr patch Place 21 mg onto the skin daily.    . nitroGLYCERIN (NITROSTAT) 0.4 MG SL tablet Place 1 tablet (0.4 mg total) under the tongue every 5 (five)  minutes as needed. For chest pain 25 tablet 3  . potassium chloride SA (K-DUR,KLOR-CON) 20 MEQ tablet Take 1 tablet (20 mEq total) by mouth 2 (two) times daily. 60 tablet 5    Allergies  Allergen Reactions  . Penicillins Other (See Comments)    Patient states that it makes his throat itch really bad, and feels like it is closing up.   . Shrimp [Shellfish Allergy]     Throat swells    Past Medical History  Diagnosis Date  . CAD (coronary artery disease)     Small vessel with occluded small OM and nonobstructive large vessel disease (cath 02/2014)  . Obesity   . History of syncope 08/2008    Secondary to hypertension  . Tobacco abuse   . Hypertension, essential, benign     a. renal art [place] (6/14): no RAS  . Noncompliance   . CKD (chronic kidney disease)   . Nonischemic cardiomyopathy (HCC)     EF 45%  . Sleep apnea     Past Surgical History  Procedure Laterality Date  . None    . Left heart catheterization with coronary angiogram N/A 02/21/2014    Procedure: LEFT HEART CATHETERIZATION WITH CORONARY ANGIOGRAM;  Surgeon: Burnell Blanks, MD;  Location: [facility] CATH LAB;  Service: Cardiovascular;  Laterality: N/A;    Social History   Social History  . Marital Status: Single    Spouse Name: N/A  . Number of Children: 5  . Years of Education: N/A  Occupational History  . [employer]   .     Social History Main Topics  . Smoking status: Current Some Day Smoker -- 0.10 packs/day for 32 years    Types: Cigarettes  . Smokeless tobacco: Not on file     Comment: Started smoking at age 56 (31).  Cutting back  . Alcohol Use: No  . Drug Use: No  . Sexual Activity: Not Currently   Other Topics Concern  . Not on file   Social History Narrative   Lives with girlfriend.    Family History  Problem Relation Age of Onset  . Lupus Mother   . Heart disease Maternal Grandfather   . Coronary artery disease Neg Hx     ROS:  no fevers or chills, productive cough,  hemoptysis, dysphasia, odynophagia, melena, hematochezia, dysuria, hematuria, rash, seizure activity, pedal edema, claudication. Remaining systems are negative.  Physical Exam:   Blood pressure 232/145, pulse 92, temperature 97.4 F (36.3 C), temperature source Oral, resp. rate 25, height '5\' 7"'  (1.702 m), weight 215 lb (97.523 kg), SpO2 100 %.  General:  Well developed/well nourished in NAD Skin warm/dry Patient not depressed No peripheral clubbing Back-normal HEENT-normal/normal eyelids Neck supple/normal carotid upstroke bilaterally; no bruits; no thyromegaly chest - Basilar crackles. CV - RRR/normal S1 and S2; no murmurs, rubs; positive S4;  PMI nondisplaced Abdomen -NT/ND, no HSM, no mass, + bowel sounds, no bruit 2+ femoral pulses, no bruits Ext-no edema, chords, 2+ DP Neuro-grossly nonfocal  ECG Sinus tachycardia, left ventricular hypertrophy with repolarization abnormality.  Results for orders placed or performed during the hospital encounter of 12/15/15 (from the past 48 hour(s))  I-stat troponin, ED     Status: Abnormal   Collection Time: 12/15/15  3:51 AM  Result Value Ref Range   Troponin i, poc 0.15 (HH) 0.00 - 0.08 ng/mL   Comment NOTIFIED PHYSICIAN    Comment 3            Comment: Due to the release kinetics of cTnI, a negative result within the first hours of the onset of symptoms does not rule out myocardial infarction with certainty. If myocardial infarction is still suspected, repeat the test at appropriate intervals.   CBC with Differential     Status: None   Collection Time: 12/15/15  4:00 AM  Result Value Ref Range   WBC 8.4 4.0 - 10.5 K/uL   RBC 4.52 4.22 - 5.81 MIL/uL   Hemoglobin 14.0 13.0 - 17.0 g/dL   HCT 40.9 39.0 - 52.0 %   MCV 90.5 78.0 - 100.0 fL   MCH 31.0 26.0 - 34.0 pg   MCHC 34.2 30.0 - 36.0 g/dL   RDW 14.3 11.5 - 15.5 %   Platelets 292 150 - 400 K/uL   Neutrophils Relative % 63 %   Neutro Abs 5.3 1.7 - 7.7 K/uL   Lymphocytes  Relative 28 %   Lymphs Abs 2.3 0.7 - 4.0 K/uL   Monocytes Relative 7 %   Monocytes Absolute 0.6 0.1 - 1.0 K/uL   Eosinophils Relative 3 %   Eosinophils Absolute 0.2 0.0 - 0.7 K/uL   Basophils Relative 1 %   Basophils Absolute 0.0 0.0 - 0.1 K/uL  Brain natriuretic peptide     Status: Abnormal   Collection Time: 12/15/15  4:00 AM  Result Value Ref Range   B Natriuretic Peptide 683.3 (H) 0.0 - 100.0 pg/mL  Comprehensive metabolic panel     Status: Abnormal   Collection  Time: 12/15/15  4:00 AM  Result Value Ref Range   Sodium 139 135 - 145 mmol/L   Potassium 3.7 3.5 - 5.1 mmol/L   Chloride 103 101 - 111 mmol/L   CO2 23 22 - 32 mmol/L   Glucose, Bld 123 (H) 65 - 99 mg/dL   BUN 25 (H) 6 - 20 mg/dL   Creatinine, Ser 1.87 (H) 0.61 - 1.24 mg/dL   Calcium 9.0 8.9 - 10.3 mg/dL   Total Protein 7.4 6.5 - 8.1 g/dL   Albumin 3.6 3.5 - 5.0 g/dL   AST 63 (H) 15 - 41 U/L   ALT 35 17 - 63 U/L   Alkaline Phosphatase 65 38 - 126 U/L   Total Bilirubin 0.8 0.3 - 1.2 mg/dL   GFR calc non Af Amer 40 (L) >60 mL/min   GFR calc Af Amer 47 (L) >60 mL/min    Comment: (NOTE) The eGFR has been calculated using the CKD EPI equation. This calculation has not been validated in all clinical situations. eGFR's persistently <60 mL/min signify possible Chronic Kidney Disease.    Anion gap 13 5 - 15  I-Stat arterial blood gas, ED     Status: Abnormal   Collection Time: 12/15/15  5:19 AM  Result Value Ref Range   pH, Arterial 7.324 (L) 7.350 - 7.450   pCO2 arterial 48.9 (H) 35.0 - 45.0 mmHg   pO2, Arterial 467.0 (H) 80.0 - 100.0 mmHg   Bicarbonate 25.4 (H) 20.0 - 24.0 mEq/L   TCO2 27 0 - 100 mmol/L   O2 Saturation 100.0 %   Acid-base deficit 1.0 0.0 - 2.0 mmol/L   Patient temperature 98.6 F    Collection site RADIAL, ALLEN'S TEST ACCEPTABLE    Drawn by Operator    Sample type ARTERIAL     Dg Chest Port 1 View  12/15/2015  CLINICAL DATA:  Acute onset of respiratory distress. Initial encounter. EXAM:  PORTABLE CHEST 1 VIEW COMPARISON:  Chest radiograph performed 09/08/2014 FINDINGS: The lungs are well-aerated. Vascular congestion is noted. Mildly increased interstitial markings may reflect minimal interstitial edema. There is no evidence of pleural effusion or pneumothorax. The cardiomediastinal silhouette is enlarged. No acute osseous abnormalities are seen. IMPRESSION: Vascular congestion and cardiomegaly. Mildly increased interstitial markings may reflect minimal interstitial edema. Electronically Signed   By: Garald Balding M.D.   On: 12/15/2015 04:26    Assessment/Plan 1 acute on chronic systolic congestive heart failure-plan diuresis with Lasix 80 mg IV twice a day. Follow renal function closely. Repeat echocardiogram to assess LV function. 2 history of nonischemic cardiomyopathy-likely secondary to hypertension that is uncontrolled. Repeat echocardiogram. I will avoid ACE inhibitors for now given baseline renal insufficiency and need for diuresis. Treat with hydralazine 25 mg by mouth 3 times a day and increase as needed. Continue IV nitroglycerin. Add beta blockade later as congestive heart failure improves. 3 hypertensive urgency-add hydralazine and increase as needed for blood pressure control. Continue IV nitroglycerin. Lower blood pressure slowly. 4 unstable angina-patient has chest pain that is concerning for angina. Also with history of coronary artery disease. Continue to cycle enzymes. Add aspirin and statin. He will ultimately need catheterization once congestive heart failure improves. He will be at risk for contrast nephropathy. 5 chronic stage III kidney disease-follow renal function closely with diuresis. 6 noncompliance-patient instructed on the importance of compliance with medications. I explained that if he does not take his medications and blood pressure is not controlled his congestive heart failure/LV function will  not improve and he may require dialysis in the future. He will  need a social work consult to help with medication cost prior to discharge. 7 history of cocaine use-check urine drug screen. 8 Coronary artery disease-add aspirin and statin.  Kirk Ruths MD 12/15/2015, 7:41 AM

## 2015-12-15 NOTE — ED Notes (Signed)
Pt complains of being SOB for the past few months. Pt has had a cough that is productive per pt coughing up green. Pt denies fever.

## 2015-12-15 NOTE — Code Documentation (Signed)
Pt placed on Bipap

## 2015-12-15 NOTE — Progress Notes (Signed)
  Echocardiogram 2D Echocardiogram has been performed.  Janalyn Harder 12/15/2015, 8:50 AM

## 2015-12-15 NOTE — ED Provider Notes (Signed)
CSN: [id number]     Arrival date & time 12/15/15  0315 History  By signing my name below, I, Bethel Born, attest that this documentation has been prepared under the direction and in the presence of Geoffery Lyons, MD. Electronically Signed: Bethel Born, ED Scribe. 12/15/2015. 3:37 AM   Chief Complaint  Patient presents with  . Shortness of Breath    The history is provided by the patient. No language interpreter was used.   [name] is a 50 y.o. male with PMHx of nonischemic cardiomyopathy, CAD, CKD, and HTN who presents to the Emergency Department complaining of constant and worsening SOB with onset a few months ago. Pt states that his breathing significantly worsened last night and woke him from sleeping. His breathing is worse with laying flat and exertion.  Associated symptoms include a cough productive of green sputum. Pt denies chest pain, fever, and LE swelling. He has been out of his blood pressure medication for 3 weeks. No known history of asthma, COPD, or emphysema. He quit smoking 2 months ago.  He has no PCP.   Past Medical History  Diagnosis Date  . CAD (coronary artery disease)     Small vessel with occluded small OM and nonobstructive large vessel disease (cath 02/2014)  . Obesity   . History of syncope 08/2008    Secondary to hypertension  . Tobacco abuse   . Hypertension, essential, benign     a. renal art [place] (6/14): no RAS  . Noncompliance   . CKD (chronic kidney disease)   . Nonischemic cardiomyopathy (HCC)     EF 45%  . Sleep apnea    Past Surgical History  Procedure Laterality Date  . None    . Left heart catheterization with coronary angiogram N/A 02/21/2014    Procedure: LEFT HEART CATHETERIZATION WITH CORONARY ANGIOGRAM;  Surgeon: Kathleene Hazel, MD;  Location: [facility] CATH LAB;  Service: Cardiovascular;  Laterality: N/A;   Family History  Problem Relation Age of Onset  . Lupus Mother   . Heart disease Maternal Grandfather   . Coronary  artery disease Neg Hx    Social History  Substance Use Topics  . Smoking status: Current Some Day Smoker -- 0.10 packs/day for 32 years    Types: Cigarettes  . Smokeless tobacco: None     Comment: Started smoking at age 10 (27).  Cutting back  . Alcohol Use: No    Review of Systems 10 Systems reviewed and all are negative for acute change except as noted in the HPI.   Allergies  Penicillins and Shrimp  Home Medications   Prior to Admission medications   Medication Sig Start Date End Date Taking? Authorizing Provider  amLODipine (NORVASC) 10 MG tablet Take 1 tablet (10 mg total) by mouth daily. 09/11/14   Dwana Melena, PA-C  aspirin EC 81 MG tablet Take 1 tablet (81 mg total) by mouth daily. 05/14/13   Lewayne Bunting, MD  benazepril (LOTENSIN) 40 MG tablet TAKE ONE TABLET BY MOUTH DAILY 06/24/15   Dwana Melena, PA-C  furosemide (LASIX) 80 MG tablet Take 1 tablet (80 mg total) by mouth daily. NEED OV. 07/17/15   Dwana Melena, PA-C  hydrALAZINE (APRESOLINE) 10 MG tablet Take 1 tablet (10 mg total) by mouth every 8 (eight) hours. 09/11/14   Dwana Melena, PA-C  labetalol (NORMODYNE) 300 MG tablet Take 1 tablet (300 mg total) by mouth 3 (three) times daily. 09/11/14   Dwana Melena,  PA-C  lovastatin (MEVACOR) 20 MG tablet Take 2 tablets (40 mg total) by mouth at bedtime. 01/25/14   Lacie Scotts, MD  nitroGLYCERIN (NITROSTAT) 0.4 MG SL tablet Place 1 tablet (0.4 mg total) under the tongue every 5 (five) minutes as needed. For chest pain 08/10/12   Herby Abraham, MD  potassium chloride SA (K-DUR,KLOR-CON) 20 MEQ tablet Take 1 tablet (20 mEq total) by mouth 2 (two) times daily. 09/11/14   Dwana Melena, PA-C   BP 265/156 mmHg  Pulse 102  Resp 27  Ht 5\' 7"  (1.702 m)  Wt 215 lb (97.523 kg)  BMI 33.67 kg/m2  SpO2 94% Physical Exam  Constitutional: He is oriented to person, place, and time. He appears well-developed and well-nourished.  HENT:  Head: Normocephalic and atraumatic.   Eyes: EOM are normal.  Neck: Normal range of motion.  Cardiovascular: Normal rate, regular rhythm, normal heart sounds and intact distal pulses.   Pulmonary/Chest: Effort normal. No respiratory distress. He has rales.  There are rales in the bases bilaterally  Abdominal: Soft. He exhibits no distension. There is no tenderness.  Musculoskeletal: Normal range of motion. He exhibits edema.  Trace LE edema.   Neurological: He is alert and oriented to person, place, and time.  Skin: Skin is warm and dry.  Psychiatric: He has a normal mood and affect. Judgment normal.  Nursing note and vitals reviewed.   ED Course  Procedures (including critical care time) DIAGNOSTIC STUDIES: Oxygen Saturation is 94% on 2L, adequate by my interpretation.    COORDINATION OF CARE: 3:33 AM Discussed treatment plan which includes lab work, CXR, EKG, a breathing treatment, NTG paste, and Lasix with pt at bedside and pt agreed to plan.  Labs Review Labs Reviewed  CBC WITH DIFFERENTIAL/PLATELET  BRAIN NATRIURETIC PEPTIDE  COMPREHENSIVE METABOLIC PANEL  I-STAT TROPOININ, ED    Imaging Review No results found. I have personally reviewed and evaluated these images and lab results as part of my medical decision-making.   EKG Interpretation   Date/Time:  Monday December 15 2015 03:22:43 EST Ventricular Rate:  104 PR Interval:  156 QRS Duration: 91 QT Interval:  368 QTC Calculation: 484 R Axis:   27 Text Interpretation:  Sinus tachycardia Probable left atrial enlargement  LVH with secondary repolarization abnormality Borderline prolonged QT  interval Confirmed by Judd Lien  MD, Adetokunbo Mccadden ([id number]) on 12/15/2015 3:30:26 AM      MDM   Final diagnoses:  None    Patient is a 50 year old male with history of hypertension with noncompliance to therapy. He presents today for evaluation of shortness of breath that has been ongoing for the past 2 months. He states that it became worse today and presents for  evaluation of this. He is markedly hypertensive upon arrival and his EKG shows LVH with repolarization abnormality. Workup was initiated and patient was given Lasix and an inch of nitroglycerin paste to treat what clinically appeared to be a CHF exacerbation.  Shortly after this was started, the patient began to complain of worsening breathing. I went to reevaluate the patient and found him diaphoretic, agitated, and very anxious, claiming that he couldn't breathe. He was then placed on a nonrebreather however continued to be agitated. I then called for respiratory to apply BiPAP. While waiting for respiratory to arrive, he remained very anxious and I actually considered performing intubation. He was eventually placed on BiPAP and a nitroglycerin drip and his condition has since markedly improved.  His workup does reveal  a mildly elevated troponin which I suspect is related to demand ischemia as well as a chest x-ray which reveals mild pulmonary edema. His BNP is elevated at 683. The remainder of his workup is otherwise unremarkable.  I have spoken with Drs. Sommer from [facility] who is recommending admission to the hospitalist service. I've spoken with Dr. Maryfrances Bunnell who agrees to admit.  CRITICAL CARE Performed by: Geoffery Lyons Total critical care time: 70 minutes Critical care time was exclusive of separately billable procedures and treating other patients. Critical care was necessary to treat or prevent imminent or life-threatening deterioration. Critical care was time spent personally by me on the following activities: development of treatment plan with patient and/or surrogate as well as nursing, discussions with consultants, evaluation of patient's response to treatment, examination of patient, obtaining history from patient or surrogate, ordering and performing treatments and interventions, ordering and review of laboratory studies, ordering and review of radiographic studies, pulse oximetry and  re-evaluation of patient's condition.   I personally performed the services described in this documentation, which was scribed in my presence. The recorded information has been reviewed and is accurate.       Geoffery Lyons, MD 12/15/15 [phone]

## 2015-12-16 DIAGNOSIS — R0602 Shortness of breath: Secondary | ICD-10-CM | POA: Insufficient documentation

## 2015-12-16 DIAGNOSIS — I5043 Acute on chronic combined systolic (congestive) and diastolic (congestive) heart failure: Secondary | ICD-10-CM | POA: Diagnosis present

## 2015-12-16 DIAGNOSIS — I5023 Acute on chronic systolic (congestive) heart failure: Secondary | ICD-10-CM

## 2015-12-16 LAB — COMPREHENSIVE METABOLIC PANEL
ALK PHOS: 61 U/L (ref 38–126)
ALT: 34 U/L (ref 17–63)
AST: 53 U/L — ABNORMAL HIGH (ref 15–41)
Albumin: 3.5 g/dL (ref 3.5–5.0)
Anion gap: 16 — ABNORMAL HIGH (ref 5–15)
BILIRUBIN TOTAL: 2.1 mg/dL — AB (ref 0.3–1.2)
BUN: 24 mg/dL — AB (ref 6–20)
CO2: 27 mmol/L (ref 22–32)
CREATININE: 2.16 mg/dL — AB (ref 0.61–1.24)
Calcium: 8.6 mg/dL — ABNORMAL LOW (ref 8.9–10.3)
Chloride: 95 mmol/L — ABNORMAL LOW (ref 101–111)
GFR calc Af Amer: 39 mL/min — ABNORMAL LOW (ref >60)
GFR, EST NON AFRICAN AMERICAN: 34 mL/min — AB (ref >60)
Glucose, Bld: 106 mg/dL — ABNORMAL HIGH (ref 65–99)
Potassium: 4 mmol/L (ref 3.5–5.1)
SODIUM: 138 mmol/L (ref 135–145)
TOTAL PROTEIN: 7.1 g/dL (ref 6.5–8.1)

## 2015-12-16 LAB — CBC
HCT: 37.5 % — ABNORMAL LOW (ref 39.0–52.0)
Hemoglobin: 13.3 g/dL (ref 13.0–17.0)
MCH: 31.5 pg (ref 26.0–34.0)
MCHC: 35.5 g/dL (ref 30.0–36.0)
MCV: 88.9 fL (ref 78.0–100.0)
PLATELETS: 277 10*3/uL (ref 150–400)
RBC: 4.22 MIL/uL (ref 4.22–5.81)
RDW: 14.3 % (ref 11.5–15.5)
WBC: 8.9 10*3/uL (ref 4.0–10.5)

## 2015-12-16 MED ORDER — FUROSEMIDE 40 MG PO TABS
40.0000 mg | ORAL_TABLET | Freq: Every day | ORAL | Status: DC
  Administered 2015-12-16 – 2015-12-23 (×8): 40 mg via ORAL
  Filled 2015-12-16 (×9): qty 1

## 2015-12-16 MED ORDER — ISOSORBIDE MONONITRATE ER 60 MG PO TB24
60.0000 mg | ORAL_TABLET | Freq: Every day | ORAL | Status: DC
  Administered 2015-12-16 – 2015-12-20 (×5): 60 mg via ORAL
  Filled 2015-12-16 (×5): qty 1

## 2015-12-16 MED ORDER — HYDRALAZINE HCL 50 MG PO TABS
75.0000 mg | ORAL_TABLET | Freq: Three times a day (TID) | ORAL | Status: DC
  Administered 2015-12-16 – 2015-12-17 (×3): 75 mg via ORAL
  Filled 2015-12-16 (×3): qty 1

## 2015-12-16 MED ORDER — CARVEDILOL 3.125 MG PO TABS
3.1250 mg | ORAL_TABLET | Freq: Two times a day (BID) | ORAL | Status: DC
  Administered 2015-12-16 – 2015-12-17 (×3): 3.125 mg via ORAL
  Filled 2015-12-16 (×3): qty 1

## 2015-12-16 MED ORDER — AMLODIPINE BESYLATE 5 MG PO TABS
5.0000 mg | ORAL_TABLET | Freq: Every day | ORAL | Status: DC
  Administered 2015-12-16 – 2015-12-17 (×2): 5 mg via ORAL
  Filled 2015-12-16 (×2): qty 1

## 2015-12-16 NOTE — Progress Notes (Signed)
Pt has no distress and is resting well on room air.  At this time, pt request to have BiPAP on standby.  RT complied.  RT will continue to monitor pt and will place if needed or requested.

## 2015-12-16 NOTE — Progress Notes (Addendum)
Spoke w pt. No pcp . [facility] has [facility] but not taking new pt's til march. Gave him list of [place] clinics including [facility]. [facility]. Have left message for transitional care team thru [facility] to see if they can get him in clinic also. Placed match letter on shadow chart for 34 days of meds at disch.pt and fam req to speak w cm later in day. Pt wants to apply for disability and cm expalined that cm nor sw assist w this. [facility] [employer] co [facility] will speak w pt and if she feels maybe disability approp may get [facility] to help w this process.

## 2015-12-16 NOTE — Progress Notes (Signed)
[facility] PROGRESS NOTE   [name] MMH:[id number] DOB: 05/14/1966 DOA: 12/15/2015 PCP: No PCP Per Patient  HPI/Subjective: Patient feels much better today, blood pressure is improving. MRSA breath is better, off of BiPAP on room air.  Assessment/Plan: Principal Problem:   Hypertensive emergency Active Problems:   Elevated troponin   Pulmonary edema   CKD (chronic kidney disease), stage III   Hepatitis   Acute on chronic systolic heart failure (HCC)   SOB (shortness of breath)   Hypertensive emergency:  -Nitroglycerin gtt for now -Titrate to achieve BP 190-210 mmHg systolic (25-30% reduction from presentation) -Admitted to stepdown, try to titrate off of nitroglycerin. -Added Norvasc, Coreg and hydralazine. -Appreciate cardiology consultation and help.  Acute on chronic systolic CHF:  -2-D echo showed LVEF of 20-25%, nonischemic cardiomyopathy.peat echocardiogram -40 mg of Lasix daily. -Started on Coreg, hydralazine and Lasix by cardiology. Improving.  Acute hypoxic respiratory failure -Secondary to acute on chronic systolic CHF. -Was briefly on BiPAP, tapered off currently on room air. -This is resolved probably with resolution of pulmonary edema.    Elevated troponin:  -Due to #1 and 2. -Trend troponin  CKD stage III:  Stable, baseline appears to be ~1.6-2.0 in the last few years.  Transaminitis This is likely congestive hepatopathy secondary to the acute/chronic systolic CHF.  Pulmonary edema:  Secondary to acute on chronic systolic CHF.   Code Status: Full Code Family Communication: Plan discussed with the patient. Disposition Plan: Remains inpatient Diet: Diet Heart Room service appropriate?: Yes; Fluid consistency:: Thin  Consultants:  Cardiology  Procedures:  2-D echo pending  Antibiotics:  None   Objective: Filed Vitals:   12/16/15 1149 12/16/15 1200  BP: 182/128 170/116  Pulse: 74 78  Temp: 98.1 F (36.7 C)     Resp: 2 19    Intake/Output Summary (Last 24 hours) at 12/16/15 1307 Last data filed at 12/16/15 1019  Gross per 24 hour  Intake 713.53 ml  Output   2525 ml  Net -1811.47 ml   Filed Weights   12/15/15 0330 12/16/15 0332  Weight: 97.523 kg (215 lb) 96.5 kg (212 lb 11.9 oz)    Exam: General: Alert and awake, oriented x3, not in any acute distress. HEENT: anicteric sclera, pupils reactive to light and accommodation, EOMI CVS: S1-S2 clear, no murmur rubs or gallops Chest: clear to auscultation bilaterally, no wheezing, rales or rhonchi Abdomen: soft nontender, nondistended, normal bowel sounds, no organomegaly Extremities: no cyanosis, clubbing or edema noted bilaterally Neuro: Cranial nerves II-XII intact, no focal neurological deficits  Data Reviewed: Basic Metabolic Panel:  Recent Labs Lab 12/15/15 0400 12/15/15 1000 12/16/15 0221  NA 139  --  138  K 3.7  --  4.0  CL 103  --  95*  CO2 23  --  27  GLUCOSE 123*  --  106*  BUN 25*  --  24*  CREATININE 1.87* 2.08* 2.16*  CALCIUM 9.0  --  8.6*   Liver Function Tests:  Recent Labs Lab 12/15/15 0400 12/16/15 0221  AST 63* 53*  ALT 35 34  ALKPHOS 65 61  BILITOT 0.8 2.1*  PROT 7.4 7.1  ALBUMIN 3.6 3.5   No results for input(s): LIPASE, AMYLASE in the last 168 hours. No results for input(s): AMMONIA in the last 168 hours. CBC:  Recent Labs Lab 12/15/15 0400 12/15/15 1000 12/16/15 0221  WBC 8.4 10.0 8.9  NEUTROABS 5.3  --   --   HGB 14.0 13.3 13.3  HCT 40.9  39.1 37.5*  MCV 90.5 90.5 88.9  PLT 292 251 277   Cardiac Enzymes:  Recent Labs Lab 12/15/15 1000 12/15/15 1301 12/15/15 1845  TROPONINI 0.24* 0.31* 0.22*   BNP (last 3 results)  Recent Labs  12/15/15 0400  BNP 683.3*    ProBNP (last 3 results) No results for input(s): PROBNP in the last 8760 hours.  CBG: No results for input(s): GLUCAP in the last 168 hours.  Micro Recent Results (from the past 240 hour(s))  MRSA PCR Screening      Status: None   Collection Time: 12/15/15  7:15 AM  Result Value Ref Range Status   MRSA by PCR NEGATIVE NEGATIVE Final    Comment:        The GeneXpert MRSA Assay (FDA approved for NASAL specimens only), is one component of a comprehensive MRSA colonization surveillance program. It is not intended to diagnose MRSA infection nor to guide or monitor treatment for MRSA infections.      Studies: Dg Chest Port 1 View  12/15/2015  CLINICAL DATA:  Acute onset of respiratory distress. Initial encounter. EXAM: PORTABLE CHEST 1 VIEW COMPARISON:  Chest radiograph performed 09/08/2014 FINDINGS: The lungs are well-aerated. Vascular congestion is noted. Mildly increased interstitial markings may reflect minimal interstitial edema. There is no evidence of pleural effusion or pneumothorax. The cardiomediastinal silhouette is enlarged. No acute osseous abnormalities are seen. IMPRESSION: Vascular congestion and cardiomegaly. Mildly increased interstitial markings may reflect minimal interstitial edema. Electronically Signed   By: Roanna Raider M.D.   On: 12/15/2015 04:26    Scheduled Meds: . amLODipine  5 mg Oral Daily  . aspirin  325 mg Oral Daily  . atorvastatin  80 mg Oral q1800  . carvedilol  3.125 mg Oral BID WC  . enoxaparin (LOVENOX) injection  40 mg Subcutaneous Daily  . furosemide  40 mg Oral Daily  . hydrALAZINE  75 mg Oral 3 times per day  . isosorbide mononitrate  60 mg Oral Daily  . sodium chloride  3 mL Intravenous Q12H   Continuous Infusions:       Time spent: 35 minutes    [facility]  [facility] Pager [phone] If 7PM-7AM, please contact night-coverage at [email or web address], password [facility] 12/16/2015, 1:07 PM  LOS: 1 day

## 2015-12-16 NOTE — Clinical Documentation Improvement (Signed)
Internal Medicine  Can the need for BIPAP be further specified?   Document Acuity - Acute, Chronic, Acute on Chronic  Document Inclusion Of - Hypoxia, Hypercapnia, Combination of Both   Acute Respiratory Failure  Other  Clinically Undetermined  Document any associated diagnoses/conditions. Please update your documentation within the medical record to reflect your response to this query. Thank you.  Supporting Information: (As per notes) "Hypertensive Emergency, Pulmonary Edema, A/C CHF, elevated troponin" "pulmonary edema on chest x-ray, evidence of myocardial necrosis, elevated BNP, and was restless. In the ER, there was initially concern that he may have to be intubated because of restlessness, but his breathing improved with BiPAP, nitroglycerin gtt was started and his blood pressure was brought down to 200/125." SpO2 dropped to 86 in ED.  Please exercise your independent, professional judgment when responding. A specific answer is not anticipated or expected.  Thank You, Nevin Bloodgood, RN, BSN, CCDS,Clinical Documentation Specialist:  [phone]  [phone]=[facility]- Health Information Management

## 2015-12-16 NOTE — Progress Notes (Signed)
    Subjective:  Denies CP or dyspnea   Objective:  Filed Vitals:   12/16/15 0332 12/16/15 0400 12/16/15 0500 12/16/15 0741  BP:  167/121 160/104 169/123  Pulse:  73 72 76  Temp: 98.2 F (36.8 C)   98 F (36.7 C)  TempSrc: Oral   Oral  Resp:  14 16 28   Height:      Weight: 212 lb 11.9 oz (96.5 kg)     SpO2:  95% 97% 97%    Intake/Output from previous day:  Intake/Output Summary (Last 24 hours) at 12/16/15 0747 Last data filed at 12/16/15 0500  Gross per 24 hour  Intake 614.35 ml  Output   4200 ml  Net -3585.65 ml    Physical Exam: Physical exam: Well-developed well-nourished in no acute distress.  Skin is warm and dry.  HEENT is normal.  Neck is supple.  Chest is clear to auscultation with normal expansion.  Cardiovascular exam is regular rate and rhythm.  Abdominal exam nontender or distended. No masses palpated. Extremities show no edema. neuro grossly intact    Lab Results: Basic Metabolic Panel:  Recent Labs  62/22/97 0400 12/15/15 1000 12/16/15 0221  NA 139  --  138  K 3.7  --  4.0  CL 103  --  95*  CO2 23  --  27  GLUCOSE 123*  --  106*  BUN 25*  --  24*  CREATININE 1.87* 2.08* 2.16*  CALCIUM 9.0  --  8.6*   CBC:  Recent Labs  12/15/15 0400 12/15/15 1000 12/16/15 0221  WBC 8.4 10.0 8.9  NEUTROABS 5.3  --   --   HGB 14.0 13.3 13.3  HCT 40.9 39.1 37.5*  MCV 90.5 90.5 88.9  PLT 292 251 277   Cardiac Enzymes:  Recent Labs  12/15/15 1000 12/15/15 1301 12/15/15 1845  TROPONINI 0.24* 0.31* 0.22*     Assessment/Plan:  1 acute on chronic systolic congestive heart failure-Echo shows severely reduced LV function; symptoms improved; Cr mildly increased; change lasix to 40 mg po daily. Follow renal function closely.  2 history of nonischemic cardiomyopathy-likely secondary to hypertension that is uncontrolled. I will avoid ACE inhibitors for now given baseline renal insufficiency and slight worsening with diuresis. Change hydralazine  to 75 mg tid; change IV NTG to imdur 60 mg daily. Add coreg 3.125 mg BID. 3 hypertensive urgency-Increase hydralazine; add coreg and titrate as tolerated; add norvasc 5 mg daily. 4 unstable angina-patient has chest pain that is concerning for angina. Also with history of coronary artery disease. Enzymes with flat trend. Continue aspirin and statin. I would like to ultimately proceed with cardiac catheterization. However we may need to start with a functional study given risk of contrast nephropathy. We will decide based on follow-up lab work. 5 chronic stage III kidney disease-follow renal function closely with diuresis. 6 noncompliance-patient instructed on the importance of compliance with medications. I explained that if he does not take his medications and blood pressure is not controlled his congestive heart failure/LV function will not improve and he may require dialysis in the future. He will need a social work consult to help with medication cost prior to discharge. 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  Olga Millers 12/16/2015, 7:47 AM

## 2015-12-17 LAB — BASIC METABOLIC PANEL
Anion gap: 20 — ABNORMAL HIGH (ref 5–15)
BUN: 21 mg/dL — AB (ref 6–20)
CHLORIDE: 92 mmol/L — AB (ref 101–111)
CO2: 27 mmol/L (ref 22–32)
Calcium: 8.6 mg/dL — ABNORMAL LOW (ref 8.9–10.3)
Creatinine, Ser: 2.11 mg/dL — ABNORMAL HIGH (ref 0.61–1.24)
GFR calc Af Amer: 40 mL/min — ABNORMAL LOW (ref >60)
GFR calc non Af Amer: 35 mL/min — ABNORMAL LOW (ref >60)
GLUCOSE: 110 mg/dL — AB (ref 65–99)
POTASSIUM: 2.8 mmol/L — AB (ref 3.5–5.1)
Sodium: 139 mmol/L (ref 135–145)

## 2015-12-17 MED ORDER — POTASSIUM CHLORIDE CRYS ER 20 MEQ PO TBCR
40.0000 meq | EXTENDED_RELEASE_TABLET | Freq: Two times a day (BID) | ORAL | Status: AC
  Administered 2015-12-17 – 2015-12-18 (×2): 40 meq via ORAL
  Filled 2015-12-17 (×2): qty 2

## 2015-12-17 MED ORDER — POTASSIUM CHLORIDE CRYS ER 20 MEQ PO TBCR
40.0000 meq | EXTENDED_RELEASE_TABLET | Freq: Once | ORAL | Status: AC
  Administered 2015-12-17: 40 meq via ORAL
  Filled 2015-12-17: qty 2

## 2015-12-17 MED ORDER — HYDRALAZINE HCL 10 MG PO TABS: 10.0000 mg | ORAL_TABLET | Freq: Three times a day (TID) | ORAL | Status: DC

## 2015-12-17 MED ORDER — HYDRALAZINE HCL 50 MG PO TABS
100.0000 mg | ORAL_TABLET | Freq: Three times a day (TID) | ORAL | Status: DC
  Administered 2015-12-17 – 2015-12-23 (×19): 100 mg via ORAL
  Filled 2015-12-17 (×18): qty 2

## 2015-12-17 MED ORDER — CARVEDILOL 6.25 MG PO TABS
6.2500 mg | ORAL_TABLET | Freq: Two times a day (BID) | ORAL | Status: DC
  Administered 2015-12-17 – 2015-12-19 (×4): 6.25 mg via ORAL
  Filled 2015-12-17 (×4): qty 1

## 2015-12-17 MED ORDER — LISINOPRIL 20 MG PO TABS
20.0000 mg | ORAL_TABLET | Freq: Every day | ORAL | Status: DC
  Administered 2015-12-17: 20 mg via ORAL
  Filled 2015-12-17: qty 1

## 2015-12-17 MED ORDER — HYDRALAZINE HCL 50 MG PO TABS: 100.0000 mg | ORAL_TABLET | Freq: Three times a day (TID) | ORAL | Status: DC

## 2015-12-17 NOTE — Progress Notes (Signed)
    Subjective:  Denies CP or dyspnea   Objective:  Filed Vitals:   12/17/15 0530 12/17/15 0600 12/17/15 0630 12/17/15 0730  BP: 226/116 215/126 216/133 198/133  Pulse:    81  Temp:    98 F (36.7 C)  TempSrc:    Oral  Resp: 25 34 21 21  Height:      Weight:      SpO2:    98%    Intake/Output from previous day:  Intake/Output Summary (Last 24 hours) at 12/17/15 0805 Last data filed at 12/17/15 0700  Gross per 24 hour  Intake 151.28 ml  Output    300 ml  Net -148.72 ml    Physical Exam: Physical exam: Well-developed well-nourished in no acute distress.  Skin is warm and dry.  HEENT is normal.  Neck is supple.  Chest is clear to auscultation with normal expansion.  Cardiovascular exam is regular rate and rhythm.  Abdominal exam nontender or distended. No masses palpated. Extremities show no edema. neuro grossly intact    Lab Results: Basic Metabolic Panel:  Recent Labs  97/67/34 0221 12/17/15 0450  NA 138 139  K 4.0 2.8*  CL 95* 92*  CO2 27 27  GLUCOSE 106* 110*  BUN 24* 21*  CREATININE 2.16* 2.11*  CALCIUM 8.6* 8.6*   CBC:  Recent Labs  12/15/15 0400 12/15/15 1000 12/16/15 0221  WBC 8.4 10.0 8.9  NEUTROABS 5.3  --   --   HGB 14.0 13.3 13.3  HCT 40.9 39.1 37.5*  MCV 90.5 90.5 88.9  PLT 292 251 277   Cardiac Enzymes:  Recent Labs  12/15/15 1000 12/15/15 1301 12/15/15 1845  TROPONINI 0.24* 0.31* 0.22*     Assessment/Plan:  1 acute on chronic systolic congestive heart failure-Echo shows severely reduced LV function; symptoms improved; Cr mildly increased but appears to be baseline. Follow renal function closely.  2 history of nonischemic cardiomyopathy-likely secondary to hypertension that is uncontrolled. Cr is stable. Add lisinopril 20 mg daily and increase as tolerated; increase hydralazine to 100 mg po TID and continue nitrates; continue coreg but increase to 6.25 mg BID. 3 hypertensive urgency-Increase hydralazine and coreg and  titrate as tolerated; add lisinopril. 4 unstable angina-patient has chest pain that is concerning for angina. Also with history of coronary artery disease. Enzymes with flat trend. Continue aspirin and statin. I would like to ultimately proceed with cardiac catheterization. However we may need to start with a functional study given risk of contrast nephropathy. We will decide based on follow-up lab work. 5 chronic stage III kidney disease-follow renal function closely with diuresis. 6 noncompliance-patient instructed on the importance of compliance with medications. I explained that if he does not take his medications and blood pressure is not controlled his congestive heart failure/LV function will not improve and he may require dialysis in the future. He will need a social work consult to help with medication cost prior to discharge. 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 9 Hypokalemia-supplement  Olga Millers 12/17/2015, 8:05 AM

## 2015-12-17 NOTE — Progress Notes (Signed)
Pt's BP 232/156 at 0500. Scheduled PO hydralazine given. Rechecked pt blood pressure 215/126 at 0600. Cardiology paged. Waiting on response. Will continue to monitor patient closely.

## 2015-12-18 ENCOUNTER — Inpatient Hospital Stay (HOSPITAL_COMMUNITY): Payer: Self-pay

## 2015-12-18 DIAGNOSIS — R0602 Shortness of breath: Secondary | ICD-10-CM

## 2015-12-18 LAB — BASIC METABOLIC PANEL
ANION GAP: 13 (ref 5–15)
BUN: 25 mg/dL — ABNORMAL HIGH (ref 6–20)
CALCIUM: 9.1 mg/dL (ref 8.9–10.3)
CHLORIDE: 98 mmol/L — AB (ref 101–111)
CO2: 27 mmol/L (ref 22–32)
CREATININE: 2.07 mg/dL — AB (ref 0.61–1.24)
GFR calc non Af Amer: 36 mL/min — ABNORMAL LOW (ref >60)
GFR, EST AFRICAN AMERICAN: 41 mL/min — AB (ref >60)
Glucose, Bld: 106 mg/dL — ABNORMAL HIGH (ref 65–99)
Potassium: 3.2 mmol/L — ABNORMAL LOW (ref 3.5–5.1)
SODIUM: 138 mmol/L (ref 135–145)

## 2015-12-18 MED ORDER — HYDRALAZINE HCL 20 MG/ML IJ SOLN
10.0000 mg | Freq: Once | INTRAMUSCULAR | Status: AC
  Administered 2015-12-18: 10 mg via INTRAVENOUS
  Filled 2015-12-18: qty 1

## 2015-12-18 MED ORDER — REGADENOSON 0.4 MG/5ML IV SOLN
0.4000 mg | Freq: Once | INTRAVENOUS | Status: DC
  Filled 2015-12-18: qty 5

## 2015-12-18 MED ORDER — LISINOPRIL 40 MG PO TABS
40.0000 mg | ORAL_TABLET | Freq: Every day | ORAL | Status: DC
  Administered 2015-12-18 – 2015-12-23 (×6): 40 mg via ORAL
  Filled 2015-12-18 (×6): qty 1
  Filled 2015-12-18: qty 2

## 2015-12-18 MED ORDER — POTASSIUM CHLORIDE CRYS ER 20 MEQ PO TBCR
40.0000 meq | EXTENDED_RELEASE_TABLET | Freq: Two times a day (BID) | ORAL | Status: AC
  Administered 2015-12-18 (×2): 40 meq via ORAL
  Filled 2015-12-18 (×2): qty 2

## 2015-12-18 MED ORDER — AMLODIPINE BESYLATE 10 MG PO TABS
10.0000 mg | ORAL_TABLET | Freq: Every day | ORAL | Status: DC
  Administered 2015-12-18 – 2015-12-23 (×6): 10 mg via ORAL
  Filled 2015-12-18 (×7): qty 1

## 2015-12-18 MED ORDER — REGADENOSON 0.4 MG/5ML IV SOLN
INTRAVENOUS | Status: AC
  Filled 2015-12-18: qty 5

## 2015-12-18 NOTE — Progress Notes (Signed)
PROGRESS NOTE  [name] YNW:[id number] GUIDICE03/30 DOA: 12/15/2015 PCP: No PCP Per Patient  HPI: 50 y.o. male with a past medical history significant for NICM with EF 30%, HTN, and CKD without regular medical follow up who presents with dyspnea. The patient reports to me that he has had about 4 months of increasing dyspnea on exertion. This is also is accompanied by leg pain and a sharp chest pain that goes away with rest. 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 Now, in the last several weeks he has had worsening orthopnea, paroxysmal nocturnal dyspnea (waking up at night with dyspnea the last for about 10 minutes and goes away when he sits up and uses his wife's inhaler), ankle and wrist swelling, and then tonight he woke up with the dyspnea and it just wouldn't go away so he came to the ER.  Subjective / 24 H Interval events - feeling better, asking about going home - no chest pain, shortness of breath, no abdominal pain, nausea or vomiting.   Assessment/Plan: Principal Problem:   Hypertensive emergency Active Problems:   Elevated troponin   Pulmonary edema   CKD (chronic kidney disease), stage III   Hepatitis   Acute on chronic systolic heart failure (HCC)   SOB (shortness of breath)    Hypertensive emergency:  - patient initially on Nitroglycerin gtt, now discontinued and on oral medications - Added Norvasc, Coreg and hydralazine. - Appreciate cardiology consultation and help. - increase Norvasc today  Acute on chronic systolic CHF:  - 2-D echo showed LVEF of 20-25%, nonischemic cardiomyopathy.peat echocardiogram - 40 mg of Lasix daily. - Started on Coreg, hydralazine and Lasix by cardiology. Improving. - Myoview, cardiology considering cath   Acute hypoxic respiratory failure -Secondary to acute on chronic systolic CHF. -Was briefly on BiPAP, tapered off currently on room air. -This is resolved probably with resolution of pulmonary  edema.   Elevated troponin:  - Due to #1 and 2. - per cardiology plan for stress test tomorrow  CKD stage III:  - Stable, baseline appears to be ~1.6-2.0 in the last few years. - closely monitor while on Lisinopril and Lasix  Transaminitis - This is likely congestive hepatopathy secondary to the acute/chronic systolic CHF. - improved  Pulmonary edema resulting in acute hypoxic respiratory failure - Secondary to acute on chronic systolic CHF. - no longer requiring BiPAP, on room air this morning - continue Lasix   Diet: Diet NPO time specified Except for: Sips with Meds Diet Heart Room service appropriate?: Yes; Fluid consistency:: Thin Fluids: none  DVT Prophylaxis: Lovenox  Code Status: Full Code Family Communication: no family bedside  Disposition Plan: home when ready   Barriers to discharge: cardiology workup  Consultants:  Cardiology   Procedures:  None    Antibiotics  Anti-infectives    None       Studies  No results found.  Objective  Filed Vitals:   12/18/15 0909 12/18/15 1027 12/18/15 1143 12/18/15 1144  BP: 174/95 165/111 175/112 175/112  Pulse: 70 74    Temp:    97.5 F (36.4 C)  TempSrc:    Oral  Resp: 14   20  Height:      Weight:      SpO2:        Intake/Output Summary (Last 24 hours) at 12/18/15 1148 Last data filed at 12/18/15 0900  Gross per 24 hour  Intake    733 ml  Output   1550 ml  Net   -817 ml   Filed Weights   12/16/15 0332 12/17/15 0442 12/18/15 0300  Weight: 96.5 kg (212 lb 11.9 oz) 95.3 kg (210 lb 1.6 oz) 97 kg (213 lb 13.5 oz)    Exam:  GENERAL: NAD  HEENT: no scleral icterus, PERRL  NECK: supple, no LAD  LUNGS: CTA biL, no wheezing  HEART: RRR without MRG  ABDOMEN: soft, non tender  EXTREMITIES: no clubbing / cyanosis   Data Reviewed: Basic Metabolic Panel:  Recent Labs Lab 12/15/15 0400 12/15/15 1000 12/16/15 0221 12/17/15 0450 12/18/15 0344  NA 139  --  138 139 138  K 3.7  --   4.0 2.8* 3.2*  CL 103  --  95* 92* 98*  CO2 23  --  27 27 27   GLUCOSE 123*  --  106* 110* 106*  BUN 25*  --  24* 21* 25*  CREATININE 1.87* 2.08* 2.16* 2.11* 2.07*  CALCIUM 9.0  --  8.6* 8.6* 9.1   Liver Function Tests:  Recent Labs Lab 12/15/15 0400 12/16/15 0221  AST 63* 53*  ALT 35 34  ALKPHOS 65 61  BILITOT 0.8 2.1*  PROT 7.4 7.1  ALBUMIN 3.6 3.5   CBC:  Recent Labs Lab 12/15/15 0400 12/15/15 1000 12/16/15 0221  WBC 8.4 10.0 8.9  NEUTROABS 5.3  --   --   HGB 14.0 13.3 13.3  HCT 40.9 39.1 37.5*  MCV 90.5 90.5 88.9  PLT 292 251 277   Cardiac Enzymes:  Recent Labs Lab 12/15/15 1000 12/15/15 1301 12/15/15 1845  TROPONINI 0.24* 0.31* 0.22*   BNP (last 3 results)  Recent Labs  12/15/15 0400  BNP 683.3*    Recent Results (from the past 240 hour(s))  MRSA PCR Screening     Status: None   Collection Time: 12/15/15  7:15 AM  Result Value Ref Range Status   MRSA by PCR NEGATIVE NEGATIVE Final    Comment:        The GeneXpert MRSA Assay (FDA approved for NASAL specimens only), is one component of a comprehensive MRSA colonization surveillance program. It is not intended to diagnose MRSA infection nor to guide or monitor treatment for MRSA infections.      Scheduled Meds: . amLODipine  10 mg Oral Daily  . aspirin  325 mg Oral Daily  . atorvastatin  80 mg Oral q1800  . carvedilol  6.25 mg Oral BID WC  . enoxaparin (LOVENOX) injection  40 mg Subcutaneous Daily  . furosemide  40 mg Oral Daily  . hydrALAZINE  100 mg Oral 3 times per day  . isosorbide mononitrate  60 mg Oral Daily  . lisinopril  40 mg Oral Daily  . potassium chloride  40 mEq Oral BID WC  . regadenoson      . sodium chloride  3 mL Intravenous Q12H   Continuous Infusions:   Pamella Pert, MD [facility] Pager [phone]. If 7 PM - 7 AM, please contact night-coverage at [email or web address], password [facility] 12/18/2015, 11:48 AM  LOS: 3 days

## 2015-12-18 NOTE — Progress Notes (Addendum)
Pt down for MV but BP 161/111. Pt to get on treadmill. Dr [name] wishes to defer test till tomorrow. Resting images obtained today. [facility] to read.  Pt also c/o double vision, will let Dr Elvera Lennox know.  Theodore Demark, Cordelia Poche 12/18/2015 10:44 AM Beeper [phone]

## 2015-12-18 NOTE — Progress Notes (Signed)
Pt agreeable to another clinic in [place]. Pt agreeable to seeing dr Ashley Royalty and internal med/sickle cell clinic at [address] [phone], appt at 8:45a on 01-19-16 is first avail appt. Can use match letter for 34 days of meds to help til gets into this clinic. Match letter on chart.

## 2015-12-18 NOTE — Progress Notes (Signed)
At 1620 pt arrived from [facility] via w/c.  Oriented to room. Call bell at reach.  Instructed to call for assistance as needed.  Pt verbalized understanding.  Amanda Pea, Charity fundraiser.

## 2015-12-18 NOTE — Progress Notes (Signed)
    Subjective:  Denies CP or dyspnea   Objective:  Filed Vitals:   12/18/15 0400 12/18/15 0500 12/18/15 0600 12/18/15 0720  BP: 175/108 167/74 169/94 145/100  Pulse:      Temp: 98.1 F (36.7 C)   98.3 F (36.8 C)  TempSrc: Oral   Oral  Resp: 13 14 15 17   Height:      Weight:      SpO2: 100%   100%    Intake/Output from previous day:  Intake/Output Summary (Last 24 hours) at 12/18/15 0752 Last data filed at 12/18/15 0600  Gross per 24 hour  Intake    973 ml  Output   1225 ml  Net   -252 ml    Physical Exam: Physical exam: Well-developed well-nourished in no acute distress.  Skin is warm and dry.  HEENT is normal.  Neck is supple.  Chest is clear to auscultation with normal expansion.  Cardiovascular exam is regular rate and rhythm.  Abdominal exam nontender or distended. No masses palpated. Extremities show no edema. neuro grossly intact    Lab Results: Basic Metabolic Panel:  Recent Labs  11/14/48 0450 12/18/15 0344  NA 139 138  K 2.8* 3.2*  CL 92* 98*  CO2 27 27  GLUCOSE 110* 106*  BUN 21* 25*  CREATININE 2.11* 2.07*  CALCIUM 8.6* 9.1   CBC:  Recent Labs  12/15/15 1000 12/16/15 0221  WBC 10.0 8.9  HGB 13.3 13.3  HCT 39.1 37.5*  MCV 90.5 88.9  PLT 251 277   Cardiac Enzymes:  Recent Labs  12/15/15 1000 12/15/15 1301 12/15/15 1845  TROPONINI 0.24* 0.31* 0.22*     Assessment/Plan:  1 acute on chronic systolic congestive heart failure-Echo shows severely reduced LV function; symptoms improved; Cr mildly increased but appears to be baseline. Continue present dose of lasix. Follow renal function closely.  2 history of nonischemic cardiomyopathy-likely secondary to hypertension that is uncontrolled. Cr is stable. Increase isinopril to 40 mg daily; continue hydralazine and nitrates; continue coreg. 3 hypertensive urgency-Increase amlodipine to 10 mg daily; increase lisinopril to 40 mg daily. 4 unstable angina-patient has chest pain that  is concerning for angina. Also with history of coronary artery disease. Enzymes with flat trend. Continue aspirin and statin. Will plan functional study in AM for risk stratification; low threshold for cath. 5 chronic stage III kidney disease-follow renal function closely with diuresis. 6 noncompliance-patient instructed on the importance of compliance with medications. I explained previously that if he does not take his medications and blood pressure is not controlled his congestive heart failure/LV function will not improve and he may require dialysis in the future. He will need a social work consult to help with medication cost prior to discharge. 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 9 Hypokalemia-supplement Patient can be transferred to telemetry from a cardiac standpoint. Olga Millers 12/18/2015, 7:52 AM

## 2015-12-19 ENCOUNTER — Inpatient Hospital Stay (HOSPITAL_COMMUNITY): Payer: Self-pay

## 2015-12-19 DIAGNOSIS — R079 Chest pain, unspecified: Secondary | ICD-10-CM

## 2015-12-19 LAB — CBC
HEMATOCRIT: 40.8 % (ref 39.0–52.0)
Hemoglobin: 13.6 g/dL (ref 13.0–17.0)
MCH: 30.3 pg (ref 26.0–34.0)
MCHC: 33.3 g/dL (ref 30.0–36.0)
MCV: 90.9 fL (ref 78.0–100.0)
PLATELETS: 297 10*3/uL (ref 150–400)
RBC: 4.49 MIL/uL (ref 4.22–5.81)
RDW: 14.4 % (ref 11.5–15.5)
WBC: 6.7 10*3/uL (ref 4.0–10.5)

## 2015-12-19 LAB — NM MYOCAR MULTI W/SPECT W/WALL MOTION / EF
CHL CUP NUCLEAR SSS: 26
CSEPED: 5 min
CSEPEW: 1 METS
CSEPPHR: 93 {beats}/min
Exercise duration (sec): 2 s
LHR: 0
LV dias vol: 191 mL
LVSYSVOL: 130 mL
NUC STRESS TID: 1.16
Rest HR: 72 {beats}/min
SDS: 14
SRS: 12

## 2015-12-19 LAB — COMPREHENSIVE METABOLIC PANEL
ALT: 19 U/L (ref 17–63)
ANION GAP: 10 (ref 5–15)
AST: 23 U/L (ref 15–41)
Albumin: 3.2 g/dL — ABNORMAL LOW (ref 3.5–5.0)
Alkaline Phosphatase: 54 U/L (ref 38–126)
BILIRUBIN TOTAL: 0.7 mg/dL (ref 0.3–1.2)
BUN: 26 mg/dL — AB (ref 6–20)
CHLORIDE: 104 mmol/L (ref 101–111)
CO2: 24 mmol/L (ref 22–32)
Calcium: 9 mg/dL (ref 8.9–10.3)
Creatinine, Ser: 2.12 mg/dL — ABNORMAL HIGH (ref 0.61–1.24)
GFR, EST AFRICAN AMERICAN: 40 mL/min — AB (ref >60)
GFR, EST NON AFRICAN AMERICAN: 35 mL/min — AB (ref >60)
Glucose, Bld: 91 mg/dL (ref 65–99)
POTASSIUM: 3.6 mmol/L (ref 3.5–5.1)
Sodium: 138 mmol/L (ref 135–145)
TOTAL PROTEIN: 7.3 g/dL (ref 6.5–8.1)

## 2015-12-19 LAB — MAGNESIUM: MAGNESIUM: 1.9 mg/dL (ref 1.7–2.4)

## 2015-12-19 MED ORDER — TECHNETIUM TC 99M SESTAMIBI GENERIC - CARDIOLITE
10.0000 | Freq: Once | INTRAVENOUS | Status: AC | PRN
  Administered 2015-12-19: 10 via INTRAVENOUS

## 2015-12-19 MED ORDER — CARVEDILOL 12.5 MG PO TABS
12.5000 mg | ORAL_TABLET | Freq: Two times a day (BID) | ORAL | Status: DC
  Administered 2015-12-19 – 2015-12-23 (×8): 12.5 mg via ORAL
  Filled 2015-12-19 (×8): qty 1

## 2015-12-19 MED ORDER — TECHNETIUM TC 99M SESTAMIBI - CARDIOLITE
30.0000 | Freq: Once | INTRAVENOUS | Status: AC | PRN
  Administered 2015-12-19: 30 via INTRAVENOUS

## 2015-12-19 MED ORDER — REGADENOSON 0.4 MG/5ML IV SOLN
0.4000 mg | Freq: Once | INTRAVENOUS | Status: AC
  Administered 2015-12-19: 0.4 mg via INTRAVENOUS

## 2015-12-19 MED ORDER — REGADENOSON 0.4 MG/5ML IV SOLN
INTRAVENOUS | Status: AC
  Filled 2015-12-19: qty 5

## 2015-12-19 NOTE — Progress Notes (Signed)
Myoview showed     Findings consistent with ischemia.  This is a high risk study.  The left ventricular ejection fraction is moderately decreased (30-44%).  Defect 1: There is a large defect of severe severity present in the basal inferior, basal inferolateral, basal anterolateral, mid inferior, mid inferolateral and mid anterolateral location.  Plan for cath Monday for definite evaluation of coronary anatomy.   The patient understands that risks include but are not limited to stroke (1 in 1000), death (1 in 1000), kidney failure [usually temporary] (1 in 500), bleeding (1 in 200), allergic reaction [possibly serious] (1 in 200), and agrees to proceed.    Markees Carns, PAC

## 2015-12-19 NOTE — Progress Notes (Signed)
PROGRESS NOTE  [name] JDB:[id number] DOB: 24-May-1966 DOA: 12/15/2015 PCP: No PCP Per Patient  HPI: 50 y.o. male with a past medical history significant for NICM with EF 30%, HTN, and CKD without regular medical follow up who presents with dyspnea. The patient reports to me that he has had about 4 months of increasing dyspnea on exertion. This is also is accompanied by leg pain and a sharp chest pain that goes away with rest. 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 Now, in the last several weeks he has had worsening orthopnea, paroxysmal nocturnal dyspnea (waking up at night with dyspnea the last for about 10 minutes and goes away when he sits up and uses his wife's inhaler), ankle and wrist swelling, and then tonight he woke up with the dyspnea and it just wouldn't go away so he came to the ER.  Subjective / 24 H Interval events - complains of double vision for 1 day - asking again about going home   Assessment/Plan: Principal Problem:   Hypertensive emergency Active Problems:   Elevated troponin   Pulmonary edema   CKD (chronic kidney disease), stage III   Hepatitis   Acute on chronic systolic heart failure (HCC)   SOB (shortness of breath)   Hypertensive emergency:  - patient initially on Nitroglycerin gtt, now discontinued and on oral medications - Added Norvasc, Coreg and hydralazine. - Appreciate cardiology consultation and help, still hypertensive today - with diplopia, obtain MRI  Acute on chronic systolic CHF:  - 2-D echo showed LVEF of 20-25%, nonischemic cardiomyopathy.peat echocardiogram - 40 mg of Lasix daily. - Started on Coreg, hydralazine and Lasix by cardiology. Improving. - Myoview today, part 2  Acute hypoxic respiratory failure -Secondary to acute on chronic systolic CHF. -Was briefly on BiPAP, tapered off currently on room air. -This is resolved probably with resolution of pulmonary edema.   Elevated troponin:  -  Due to #1 and 2. - per cardiology plan for stress test  CKD stage III:  - Stable, baseline appears to be ~1.6-2.0 in the last few years. - closely monitor while on Lisinopril and Lasix  Transaminitis - This is likely congestive hepatopathy secondary to the acute/chronic systolic CHF. - improved  Pulmonary edema resulting in acute hypoxic respiratory failure - Secondary to acute on chronic systolic CHF. - no longer requiring BiPAP, on room air this morning - continue Lasix   Diet: Diet NPO time specified Except for: Sips with Meds Fluids: none  DVT Prophylaxis: Lovenox  Code Status: Full Code Family Communication: no family bedside  Disposition Plan: home when ready   Barriers to discharge: cardiology workup, MRI  Consultants:  Cardiology   Procedures:  None    Antibiotics  Anti-infectives    None       Studies  No results found.  Objective  Filed Vitals:   12/19/15 0912 12/19/15 0949 12/19/15 0950 12/19/15 0952  BP: 174/103 157/100 170/101 166/100  Pulse: 71 84 94 91  Temp:      TempSrc:      Resp:      Height:      Weight:      SpO2:        Intake/Output Summary (Last 24 hours) at 12/19/15 1151 Last data filed at 12/19/15 0948  Gross per 24 hour  Intake    540 ml  Output    502 ml  Net     38 ml  Filed Weights   12/18/15 1841 12/19/15 0622 12/19/15 0628  Weight: 96.163 kg (212 lb) 95.346 kg (210 lb 3.2 oz) 95.346 kg (210 lb 3.2 oz)   Exam:  GENERAL: NAD  HEENT: no scleral icterus, EOMI, no nystagmus  NECK: supple, no LAD  LUNGS: CTA biL, no wheezing  HEART: RRR without MRG  ABDOMEN: soft, non tender  EXTREMITIES: no clubbing / cyanosis  NEURO: non focal   Data Reviewed: Basic Metabolic Panel:  Recent Labs Lab 12/15/15 0400 12/15/15 1000 12/16/15 0221 12/17/15 0450 12/18/15 0344 12/19/15 0432  NA 139  --  138 139 138 138  K 3.7  --  4.0 2.8* 3.2* 3.6  CL 103  --  95* 92* 98* 104  CO2 23  --  GLUCOSE 123*  --  106* 110* 106* 91  BUN 25*  --  24* 21* 25* 26*  CREATININE 1.87* 2.08* 2.16* 2.11* 2.07* 2.12*  CALCIUM 9.0  --  8.6* 8.6* 9.1 9.0  MG  --   --   --   --   --  1.9   Liver Function Tests:  Recent Labs Lab 12/15/15 0400 12/16/15 0221 12/19/15 0432  AST 63* 53* 23  ALT 35 34 19  ALKPHOS 65 61 54  BILITOT 0.8 2.1* 0.7  PROT 7.4 7.1 7.3  ALBUMIN 3.6 3.5 3.2*   CBC:  Recent Labs Lab 12/15/15 0400 12/15/15 1000 12/16/15 0221 12/19/15 0432  WBC 8.4 10.0 8.9 6.7  NEUTROABS 5.3  --   --   --   HGB 14.0 13.3 13.3 13.6  HCT 40.9 39.1 37.5* 40.8  MCV 90.5 90.5 88.9 90.9  PLT 292 251 277 297   Cardiac Enzymes:  Recent Labs Lab 12/15/15 1000 12/15/15 1301 12/15/15 1845  TROPONINI 0.24* 0.31* 0.22*   BNP (last 3 results)  Recent Labs  12/15/15 0400  BNP 683.3*    Recent Results (from the past 240 hour(s))  MRSA PCR Screening     Status: None   Collection Time: 12/15/15  7:15 AM  Result Value Ref Range Status   MRSA by PCR NEGATIVE NEGATIVE Final    Comment:        The GeneXpert MRSA Assay (FDA approved for NASAL specimens only), is one component of a comprehensive MRSA colonization surveillance program. It is not intended to diagnose MRSA infection nor to guide or monitor treatment for MRSA infections.      Scheduled Meds: . amLODipine  10 mg Oral Daily  . aspirin  325 mg Oral Daily  . atorvastatin  80 mg Oral q1800  . carvedilol  6.25 mg Oral BID WC  . enoxaparin (LOVENOX) injection  40 mg Subcutaneous Daily  . furosemide  40 mg Oral Daily  . hydrALAZINE  100 mg Oral 3 times per day  . isosorbide mononitrate  60 mg Oral Daily  . lisinopril  40 mg Oral Daily  . regadenoson      . sodium chloride  3 mL Intravenous Q12H   Continuous Infusions:   Pamella Pert, MD [facility] Pager [phone]. If 7 PM - 7 AM, please contact night-coverage at [email or web address], password [facility] 12/19/2015, 11:51 AM  LOS: 4 days

## 2015-12-19 NOTE — Progress Notes (Signed)
Patient Name: [name] Date of Encounter: 12/19/2015   SUBJECTIVE  Seen in nuc med. Feeling well. No chest pain, sob or palpitations. BP still running high.   CURRENT MEDS . amLODipine  10 mg Oral Daily  . aspirin  325 mg Oral Daily  . atorvastatin  80 mg Oral q1800  . carvedilol  6.25 mg Oral BID WC  . enoxaparin (LOVENOX) injection  40 mg Subcutaneous Daily  . furosemide  40 mg Oral Daily  . hydrALAZINE  100 mg Oral 3 times per day  . isosorbide mononitrate  60 mg Oral Daily  . lisinopril  40 mg Oral Daily  . regadenoson      . sodium chloride  3 mL Intravenous Q12H    OBJECTIVE  Filed Vitals:   12/19/15 0912 12/19/15 0949 12/19/15 0950 12/19/15 0952  BP: 174/103 157/100 170/101 166/100  Pulse: 71 84 94 91  Temp:      TempSrc:      Resp:      Height:      Weight:      SpO2:        Intake/Output Summary (Last 24 hours) at 12/19/15 0954 Last data filed at 12/19/15 0948  Gross per 24 hour  Intake    540 ml  Output    502 ml  Net     38 ml   Filed Weights   12/18/15 1841 12/19/15 0622 12/19/15 0628  Weight: 212 lb (96.163 kg) 210 lb 3.2 oz (95.346 kg) 210 lb 3.2 oz (95.346 kg)    PHYSICAL EXAM  General: Pleasant, NAD. Neuro: Alert and oriented X 3. Moves all extremities spontaneously. Psych: Normal affect. HEENT:  Normal  Neck: Supple without bruits or JVD. Lungs:  Resp regular and unlabored, CTA. Heart: RRR no s3, s4, or murmurs. Abdomen: Soft, non-tender, non-distended, BS + x 4.  Extremities: No clubbing, cyanosis or edema. DP/PT/Radials 2+ and equal bilaterally.  Accessory Clinical Findings  CBC  Recent Labs  12/19/15 0432  WBC 6.7  HGB 13.6  HCT 40.8  MCV 90.9  PLT 297   Basic Metabolic Panel  Recent Labs  12/18/15 0344 12/19/15 0432  NA 138 138  K 3.2* 3.6  CL 98* 104  CO2 27 24  GLUCOSE 106* 91  BUN 25* 26*  CREATININE 2.07* 2.12*  CALCIUM 9.1 9.0  MG  --  1.9   Liver Function Tests  Recent Labs  12/19/15 0432    AST 23  ALT 19  ALKPHOS 54  BILITOT 0.7  PROT 7.3  ALBUMIN 3.2*    TELE  Unable to review as patient seen in nuc med.   Radiology/Studies  Dg Chest Port 1 View  12/15/2015  CLINICAL DATA:  Acute onset of respiratory distress. Initial encounter. EXAM: PORTABLE CHEST 1 VIEW COMPARISON:  Chest radiograph performed 09/08/2014 FINDINGS: The lungs are well-aerated. Vascular congestion is noted. Mildly increased interstitial markings may reflect minimal interstitial edema. There is no evidence of pleural effusion or pneumothorax. The cardiomediastinal silhouette is enlarged. No acute osseous abnormalities are seen. IMPRESSION: Vascular congestion and cardiomegaly. Mildly increased interstitial markings may reflect minimal interstitial edema. Electronically Signed   By: Roanna Raider M.D.   On: 12/15/2015 04:26    ASSESSMENT AND PLAN  1. Hypertensive emergency - BP still running high despite increase in lisinopril and amlodipine. - currently on Norvasc  qd, coreg 6.25mg  BID, hydralazine  TID, imdur  qd, lisinopril  qd and lasix  qd.  -  Will discuss with MD. 2nd HTN workup?   2. Hx of NICM  - likely for uncontrolled HTN. As above  3. acute on chronic systolic congestive heart failure - Echo 12/15/15 showed EF of 20-25% with severe diffuse hypokiness, grade  3 DD. - Diuresed negative 4.9 L with 5lb weight loss. Creatinine stable. Appears euvolemic. Continue current dose of lasix.   4. Unstable angina - Currently chest pain free. Flat troponin trend. Echo with severely reduced EF. Pending Myoview.  - Likely needs cath. Risk for contrast nephropathy.   5. CKD (chronic kidney disease), stage III - Currently stable. Seems baseline creatinine of 1.5-2.0.  6. Cocaine abuse - hx of this. UDS + for cocaine this admission.   7. Hypokalemia - Resolved today. Continue supplement with diuresis.   Signed, Bhagat,Bhavinkumar PA-C Pager [phone] As above, patient seen  and examined. Denies chest pain or dyspnea. Complains of diplopia. Blood pressure continues to be high. Continue hydralazine/nitrates, ACE inhibitor and Lasix. Increase carvedilol to 12.5 mg twice a day and titrate as needed. Await nuclear study given history of moderate coronary disease and recent chest pain. Renal function is stable on present regimen. Patient had an MRI today for diplopia. No acute CVA. Etiology unclear. May need neurology evaluation. Olga Millers

## 2015-12-20 ENCOUNTER — Inpatient Hospital Stay (HOSPITAL_COMMUNITY): Payer: Self-pay

## 2015-12-20 DIAGNOSIS — R079 Chest pain, unspecified: Secondary | ICD-10-CM | POA: Insufficient documentation

## 2015-12-20 DIAGNOSIS — R9439 Abnormal result of other cardiovascular function study: Secondary | ICD-10-CM | POA: Insufficient documentation

## 2015-12-20 DIAGNOSIS — I214 Non-ST elevation (NSTEMI) myocardial infarction: Secondary | ICD-10-CM | POA: Insufficient documentation

## 2015-12-20 DIAGNOSIS — H532 Diplopia: Secondary | ICD-10-CM

## 2015-12-20 DIAGNOSIS — I209 Angina pectoris, unspecified: Secondary | ICD-10-CM

## 2015-12-20 DIAGNOSIS — F1410 Cocaine abuse, uncomplicated: Secondary | ICD-10-CM

## 2015-12-20 DIAGNOSIS — N183 Chronic kidney disease, stage 3 (moderate): Secondary | ICD-10-CM

## 2015-12-20 LAB — COCAINE CONF, UR
BENZOYLECGONINE CONF, UR: 654 ng/mL
COCAINE METAB QUANT UR: POSITIVE — AB

## 2015-12-20 LAB — URINE DRUGS OF ABUSE SCREEN W ALC, ROUTINE (REF LAB)
Amphetamines, Urine: NEGATIVE ng/mL
BARBITURATE, UR: NEGATIVE ng/mL
Benzodiazepine Quant, Ur: NEGATIVE ng/mL
CANNABINOID QUANT UR: NEGATIVE ng/mL
ETHANOL U, QUAN: NEGATIVE %
Methadone Screen, Urine: NEGATIVE ng/mL
Opiate Quant, Ur: NEGATIVE ng/mL
Phencyclidine, Ur: NEGATIVE ng/mL
Propoxyphene, Urine: NEGATIVE ng/mL

## 2015-12-20 LAB — BASIC METABOLIC PANEL
Anion gap: 10 (ref 5–15)
BUN: 33 mg/dL — ABNORMAL HIGH (ref 6–20)
CALCIUM: 8.9 mg/dL (ref 8.9–10.3)
CHLORIDE: 104 mmol/L (ref 101–111)
CO2: 23 mmol/L (ref 22–32)
CREATININE: 2.17 mg/dL — AB (ref 0.61–1.24)
GFR calc non Af Amer: 34 mL/min — ABNORMAL LOW (ref >60)
GFR, EST AFRICAN AMERICAN: 39 mL/min — AB (ref >60)
Glucose, Bld: 103 mg/dL — ABNORMAL HIGH (ref 65–99)
Potassium: 3.6 mmol/L (ref 3.5–5.1)
SODIUM: 137 mmol/L (ref 135–145)

## 2015-12-20 MED ORDER — ISOSORBIDE MONONITRATE ER 60 MG PO TB24
60.0000 mg | ORAL_TABLET | Freq: Once | ORAL | Status: AC
  Administered 2015-12-20: 60 mg via ORAL

## 2015-12-20 MED ORDER — CLOPIDOGREL BISULFATE 75 MG PO TABS
75.0000 mg | ORAL_TABLET | Freq: Every day | ORAL | Status: DC
  Administered 2015-12-20 – 2015-12-21 (×2): 75 mg via ORAL
  Filled 2015-12-20: qty 1

## 2015-12-20 MED ORDER — METHYLPREDNISOLONE SODIUM SUCC 125 MG IJ SOLR
125.0000 mg | Freq: Four times a day (QID) | INTRAMUSCULAR | Status: DC
  Administered 2015-12-20 – 2015-12-22 (×7): 125 mg via INTRAVENOUS
  Filled 2015-12-20 (×5): qty 2

## 2015-12-20 MED ORDER — ISOSORBIDE MONONITRATE ER 60 MG PO TB24
120.0000 mg | ORAL_TABLET | Freq: Every day | ORAL | Status: DC
  Administered 2015-12-21 – 2015-12-23 (×3): 120 mg via ORAL
  Filled 2015-12-20 (×4): qty 2

## 2015-12-20 MED ORDER — FAMOTIDINE 20 MG PO TABS
20.0000 mg | ORAL_TABLET | Freq: Two times a day (BID) | ORAL | Status: DC
  Administered 2015-12-20 – 2015-12-23 (×7): 20 mg via ORAL
  Filled 2015-12-20 (×7): qty 1

## 2015-12-20 NOTE — Progress Notes (Addendum)
PROGRESS NOTE  [name] ZOX:[id number] DOB: [birth year] DOA: 12/15/2015 PCP: No PCP Per Patient  HPI: 50 y.o. male with a past medical history significant for NICM with EF 30%, HTN, and CKD without regular medical follow up who presents with dyspnea. The patient reports to me that he has had about 4 months of increasing dyspnea on exertion. This is also is accompanied by leg pain and a sharp chest pain that goes away with rest. 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 Now, in the last several weeks he has had worsening orthopnea, paroxysmal nocturnal dyspnea (waking up at night with dyspnea the last for about 10 minutes and goes away when he sits up and uses his wife's inhaler), ankle and wrist swelling, and then tonight he woke up with the dyspnea and it just wouldn't go away so he came to the ER.  Subjective / 24 H Interval events - ongoing double vision - no chest pain / dyspnea  Assessment/Plan: Principal Problem:   Hypertensive emergency Active Problems:   Elevated troponin   Pulmonary edema   CKD (chronic kidney disease), stage III   Hepatitis   Acute on chronic systolic heart failure (HCC)   SOB (shortness of breath)   Hypertensive emergency:  - patient initially on Nitroglycerin gtt, now discontinued and on oral medications - Added Norvasc, Coreg and hydralazine. - Appreciate cardiology consultation and help, still hypertensive today  Horizontal diplopia - persistent, MRI negative - consulted neurology, appreciate input  Acute on chronic systolic CHF - 2-D echo showed LVEF of 20-25% - 40 mg of Lasix daily. - Started on Coreg, hydralazine and Lasix by cardiology. Improving. - stress test yesterday high risk, plan for cardiac catheterization on Monday  Acute hypoxic respiratory failure - Secondary to acute on chronic systolic CHF. - Was briefly on BiPAP, tapered off currently on room air. - This is resolved probably with resolution of  pulmonary edema.   Elevated troponin:  - Due to #1 and 2. - cath on Monday   CKD stage III:  - Stable, baseline appears to be ~1.6-2.0 in the last few years. - closely monitor while on Lisinopril and Lasix - overall stable  Transaminitis - This is likely congestive hepatopathy secondary to the acute/chronic systolic CHF. - improved  Pulmonary edema resulting in acute hypoxic respiratory failure - Secondary to acute on chronic systolic CHF. - no longer requiring BiPAP, on room air this morning - continue Lasix   Diet: Diet Heart Room service appropriate?: Yes; Fluid consistency:: Thin Fluids: none  DVT Prophylaxis: Lovenox  Code Status: Full Code Family Communication: no family bedside  Disposition Plan: home when ready   Barriers to discharge: cath Monday, diplopia  Consultants:  Cardiology   Procedures:  None    Antibiotics  Anti-infectives    None       Studies  Mr Brain Wo Contrast  12/19/2015  CLINICAL DATA:  Diplopia. EXAM: MRI HEAD WITHOUT CONTRAST TECHNIQUE: Multiplanar, multiecho pulse sequences of the brain and surrounding structures were obtained without intravenous contrast. COMPARISON:  CT head without contrast 03/17/2013. FINDINGS: The diffusion-weighted images demonstrate no evidence for acute or subacute infarction. No acute hemorrhage or mass lesion is present. Age advanced moderate periventricular white matter disease is present bilaterally. Mild generalized atrophy is somewhat advanced for age as well. The ventricles are proportionate to the degree of atrophy. No significant extra-axial fluid collection is present. The internal auditory canals are within normal limits.  Flow is present in the major intracranial arteries. Globes and orbits are intact. Minimal mucosal thickening is present along the floor of the left maxillary sinus. The skullbase is unremarkable. Midline sagittal images are within normal limits. IMPRESSION: 1. No acute intracranial  abnormality. 2. Age advanced moderate periventricular and subcortical white matter disease bilaterally. The finding is nonspecific but can be seen in the setting of chronic microvascular ischemia, a demyelinating process such as multiple sclerosis, vasculitis, complicated migraine headaches, or as the sequelae of a prior infectious or inflammatory process. 3. Mild generalized atrophy is also advanced for age. Electronically Signed   By: Marin Roberts M.D.   On: 12/19/2015 12:20   Nm Myocar Multi W/spect W/wall Motion / Ef  12/19/2015   Findings consistent with ischemia.  This is a high risk study.  The left ventricular ejection fraction is moderately decreased (30-44%).  Defect 1: There is a large defect of severe severity present in the basal inferior, basal inferolateral, basal anterolateral, mid inferior, mid inferolateral and mid anterolateral location.     Objective  Filed Vitals:   12/19/15 1200 12/19/15 1700 12/19/15 2001 12/20/15 0500  BP: 177/98 168/90 132/68 176/97  Pulse: 67 76 74 83  Temp:   97.9 F (36.6 C) 98.1 F (36.7 C)  TempSrc:   Oral Oral  Resp:   18 18  Height:      Weight:    96.163 kg (212 lb)  SpO2: 99% 99% 96% 100%    Intake/Output Summary (Last 24 hours) at 12/20/15 1049 Last data filed at 12/20/15 1610  Gross per 24 hour  Intake    720 ml  Output    800 ml  Net    -80 ml   Filed Weights   12/19/15 0622 12/19/15 0628 12/20/15 0500  Weight: 95.346 kg (210 lb 3.2 oz) 95.346 kg (210 lb 3.2 oz) 96.163 kg (212 lb)   Exam:  GENERAL: NAD  HEENT: no scleral icterus, EOMI, no nystagmus, PERRL  NECK: supple, no LAD  LUNGS: CTA biL, no wheezing  HEART: RRR without MRG  ABDOMEN: soft, non tender  EXTREMITIES: no clubbing / cyanosis  NEURO: non focal   Data Reviewed: Basic Metabolic Panel:  Recent Labs Lab 12/16/15 0221 12/17/15 0450 12/18/15 0344 12/19/15 0432 12/20/15 0318  NA 138 139 138 138 137  K 4.0 2.8* 3.2* 3.6 3.6  CL 95*  92* 98* 104 104  CO2 27 27 27 24 23   GLUCOSE 106* 110* 106* 91 103*  BUN 24* 21* 25* 26* 33*  CREATININE 2.16* 2.11* 2.07* 2.12* 2.17*  CALCIUM 8.6* 8.6* 9.1 9.0 8.9  MG  --   --   --  1.9  --    Liver Function Tests:  Recent Labs Lab 12/15/15 0400 12/16/15 0221 12/19/15 0432  AST 63* 53* 23  ALT 35 34 19  ALKPHOS 65 61 54  BILITOT 0.8 2.1* 0.7  PROT 7.4 7.1 7.3  ALBUMIN 3.6 3.5 3.2*   CBC:  Recent Labs Lab 12/15/15 0400 12/15/15 1000 12/16/15 0221 12/19/15 0432  WBC 8.4 10.0 8.9 6.7  NEUTROABS 5.3  --   --   --   HGB 14.0 13.3 13.3 13.6  HCT 40.9 39.1 37.5* 40.8  MCV 90.5 90.5 88.9 90.9  PLT 292 251 277 297   Cardiac Enzymes:  Recent Labs Lab 12/15/15 1000 12/15/15 1301 12/15/15 1845  TROPONINI 0.24* 0.31* 0.22*   BNP (last 3 results)  Recent Labs  12/15/15 0400  BNP  683.3*    Recent Results (from the past 240 hour(s))  MRSA PCR Screening     Status: None   Collection Time: 12/15/15  7:15 AM  Result Value Ref Range Status   MRSA by PCR NEGATIVE NEGATIVE Final    Comment:        The GeneXpert MRSA Assay (FDA approved for NASAL specimens only), is one component of a comprehensive MRSA colonization surveillance program. It is not intended to diagnose MRSA infection nor to guide or monitor treatment for MRSA infections.      Scheduled Meds: . amLODipine  10 mg Oral Daily  . aspirin  325 mg Oral Daily  . atorvastatin  80 mg Oral q1800  . carvedilol  12.5 mg Oral BID WC  . enoxaparin (LOVENOX) injection  40 mg Subcutaneous Daily  . furosemide  40 mg Oral Daily  . hydrALAZINE  100 mg Oral 3 times per day  . isosorbide mononitrate  60 mg Oral Daily  . lisinopril  40 mg Oral Daily  . sodium chloride  3 mL Intravenous Q12H   Continuous Infusions:   Pamella Pert, MD [facility] Pager [phone]. If 7 PM - 7 AM, please contact night-coverage at [email or web address], password [facility] 12/20/2015, 10:49 AM  LOS: 5 days

## 2015-12-20 NOTE — Progress Notes (Addendum)
Patient Name: [name] Date of Encounter: 12/20/2015   SUBJECTIVE  Seen in nuc med. Feeling well. No chest pain, sob or palpitations. BP still running high.   CURRENT MEDS . amLODipine  10 mg Oral Daily  . atorvastatin  80 mg Oral q1800  . carvedilol  12.5 mg Oral BID WC  . clopidogrel  75 mg Oral Daily  . enoxaparin (LOVENOX) injection  40 mg Subcutaneous Daily  . famotidine  20 mg Oral BID  . furosemide  40 mg Oral Daily  . hydrALAZINE  100 mg Oral 3 times per day  . isosorbide mononitrate  60 mg Oral Daily  . lisinopril  40 mg Oral Daily  . methylPREDNISolone (SOLU-MEDROL) injection  125 mg Intravenous Q6H  . sodium chloride  3 mL Intravenous Q12H    OBJECTIVE  Filed Vitals:   12/19/15 1200 12/19/15 1700 12/19/15 2001 12/20/15 0500  BP: 177/98 168/90 132/68 176/97  Pulse: 67 76 74 83  Temp:   97.9 F (36.6 C) 98.1 F (36.7 C)  TempSrc:   Oral Oral  Resp:   18 18  Height:      Weight:    212 lb (96.163 kg)  SpO2: 99% 99% 96% 100%    Intake/Output Summary (Last 24 hours) at 12/20/15 1233 Last data filed at 12/20/15 1062  Gross per 24 hour  Intake    720 ml  Output    800 ml  Net    -80 ml   Filed Weights   12/19/15 0622 12/19/15 0628 12/20/15 0500  Weight: 210 lb 3.2 oz (95.346 kg) 210 lb 3.2 oz (95.346 kg) 212 lb (96.163 kg)    PHYSICAL EXAM  General: Pleasant, NAD. Neuro: Alert and oriented X 3. Moves all extremities spontaneously. Psych: Normal affect. HEENT:  Normal  Neck: Supple without bruits or JVD. Lungs:  Resp regular and unlabored, CTA. Heart: RRR no s3, s4, or murmurs. Abdomen: Soft, non-tender, non-distended, BS + x 4.  Extremities: No clubbing, cyanosis or edema. DP/PT/Radials 2+ and equal bilaterally.  Accessory Clinical Findings  CBC  Recent Labs  12/19/15 0432  WBC 6.7  HGB 13.6  HCT 40.8  MCV 90.9  PLT 297   Basic Metabolic Panel  Recent Labs  12/19/15 0432 12/20/15 0318  NA 138 137  K 3.6 3.6  CL 104 104   CO2 24 23  GLUCOSE 91 103*  BUN 26* 33*  CREATININE 2.12* 2.17*  CALCIUM 9.0 8.9  MG 1.9  --    Liver Function Tests  Recent Labs  12/19/15 0432  AST 23  ALT 19  ALKPHOS 54  BILITOT 0.7  PROT 7.3  ALBUMIN 3.2*    TELE  Unable to review as patient seen in nuc med.   Radiology/Studies  Mr Brain Wo Contrast  12/19/2015  CLINICAL DATA:  Diplopia.  IMPRESSION: 1. No acute intracranial abnormality. 2. Age advanced moderate periventricular and subcortical white matter disease bilaterally. The finding is nonspecific but can be seen in the setting of chronic microvascular ischemia, a demyelinating process such as multiple sclerosis, vasculitis, complicated migraine headaches, or as the sequelae of a prior infectious or inflammatory process. 3. Mild generalized atrophy is also advanced for age. Electronically Signed   By: Marin Roberts M.D.   On: 12/19/2015 12:20   Nm Myocar Multi W/spect W/wall Motion / Ef  12/19/2015   Findings consistent with ischemia.  This is a high risk study.  The left ventricular ejection fraction is moderately  decreased (30-44%).  Defect 1: There is a large defect of severe severity present in the basal inferior, basal inferolateral, basal anterolateral, mid inferior, mid inferolateral and mid anterolateral location.    Dg Chest Port 1 View  12/15/2015  CLINICAL DATA:  Acute onset of respiratory distress. Initial encounter. EXAM: PORTABLE CHEST 1 VIEW COMPARISON:  Chest radiograph performed 09/08/2014 FINDINGS: The lungs are well-aerated. Vascular congestion is noted. Mildly increased interstitial markings may reflect minimal interstitial edema. There is no evidence of pleural effusion or pneumothorax. The cardiomediastinal silhouette is enlarged. No acute osseous abnormalities are seen. IMPRESSION: Vascular congestion and cardiomegaly. Mildly increased interstitial markings may reflect minimal interstitial edema. Electronically Signed   By: Roanna Raider M.D.   On: 12/15/2015 04:26   Nuclear stress test; 12/19/2015  Findings consistent with ischemia.  This is a high risk study.  The left ventricular ejection fraction is moderately decreased (30-44%).  Defect 1: There is a large defect of severe severity present in the basal inferior, basal inferolateral, basal anterolateral, mid inferior, mid inferolateral and mid anterolateral location.    ASSESSMENT AND PLAN  1. Hypertensive emergency - BP still running high despite increase in lisinopril ,amlodipine and imdur. Currently on Norvasc  qd, coreg 12.5 mg BID, hydralazine  TID, imdur  qd, lisinopril  qd and lasix  qd.  - Will discuss with MD. 2nd HTN workup?  - increase Imdur to 120 mg po daily.   2. Hx of NICM  - likely for uncontrolled HTN. As above  3. acute on chronic systolic congestive heart failure - Echo 12/15/15 showed EF of 20-25% with severe diffuse hypokiness, grade  3 DD. - Diuresed negative 4.9 L with 5lb weight loss. Creatinine improving. Appears euvolemic. Continue current dose of lasix.   4. Unstable angina - Currently chest pain free. Flat troponin trend. Echo with severely reduced EF. Stress test pending,  - Risk for contrast nephropathy, crea is improving, continue the same low dose lasix.   5. CKD (chronic kidney disease), stage III - Currently stable. Seems baseline creatinine of 1.5-2.0.  6. Cocaine abuse - hx of this. UDS + for cocaine this admission.   7. Hypokalemia - Resolved today. Continue supplement with diuresis.    Lars Masson

## 2015-12-20 NOTE — Consult Note (Signed)
Requesting Physician: Dr.  Elvera Lennox    Reason for consultation: acute binocular diplopia x 2 days  HPI:                                                                                                                                         [name] is an 50 y.o. male patient with Acute onset of binocular diplopia during this hospitalization, couple of days ago.  Denies blurred vision  Through either eye,  Diplopia resolves with closing one eye.  Denies any headaches, speech problems or any new sensory or motor  Symptoms in the limbs.   He has  past medical history significant for NICM with EF 30%, HTN, and CKD without regular medical follow up who presents with dyspnea. When he initially presented to ER, he was hypertensive to 265/156 mmHg, with pulmonary edema on chest x-ray, evidence of myocardial necrosis, elevated BNP. UDS  Positive for cocaine.    patient denies regular cocaine abuse.  He reported using cocaine just prior to the hospitalization ,  apparently [birth year], which is [birth year].     Past Medical History: Past Medical History  Diagnosis Date  . CAD (coronary artery disease)     Small vessel with occluded small OM and nonobstructive large vessel disease (cath 02/2014)  . Obesity   . History of syncope 08/2008    Secondary to hypertension  . Tobacco abuse   . Hypertension, essential, benign     a. renal art [place] (6/14): no RAS  . Noncompliance   . CKD (chronic kidney disease)   . Nonischemic cardiomyopathy (HCC)     EF 45%  . Sleep apnea     Past Surgical History  Procedure Laterality Date  . None    . Left heart catheterization with coronary angiogram N/A 02/21/2014    Procedure: LEFT HEART CATHETERIZATION WITH CORONARY ANGIOGRAM;  Surgeon: Kathleene Hazel, MD;  Location: [facility] CATH LAB;  Service: Cardiovascular;  Laterality: N/A;    Family History: Family History  Problem Relation Age of Onset  . Lupus Mother   . Heart disease Maternal Grandfather    . Coronary artery disease Neg Hx     Social History:   reports that he has been smoking Cigarettes.  He has a 3.2 pack-year smoking history. He does not have any smokeless tobacco history on file. He reports that he does not drink alcohol or use illicit drugs.  Allergies:  Allergies  Allergen Reactions  . Penicillins Other (See Comments)    Patient states that it makes his throat itch really bad, and feels like it is closing up.   . Shrimp [Shellfish Allergy]     Throat swells     Medications:  Current facility-administered medications:  .  acetaminophen (TYLENOL) tablet 650 mg, 650 mg, Oral, Q6H PRN, 650 mg at 12/18/15 0615 **OR** acetaminophen (TYLENOL) suppository 650 mg, 650 mg, Rectal, Q6H PRN, Alberteen Sam, MD .  amLODipine (NORVASC) tablet 10 mg, 10 mg, Oral, Daily, Lewayne Bunting, MD, 10 mg at 12/20/15 1048 .  atorvastatin (LIPITOR) tablet 80 mg, 80 mg, Oral, q1800, Lewayne Bunting, MD, 80 mg at 12/19/15 1727 .  carvedilol (COREG) tablet 12.5 mg, 12.5 mg, Oral, BID WC, Lewayne Bunting, MD, 12.5 mg at 12/20/15 0800 .  clopidogrel (PLAVIX) tablet 75 mg, 75 mg, Oral, Daily, Reynol Arnone Daniel Nones, MD .  enoxaparin (LOVENOX) injection 40 mg, 40 mg, Subcutaneous, Daily, Alberteen Sam, MD, 40 mg at 12/20/15 1000 .  famotidine (PEPCID) tablet 20 mg, 20 mg, Oral, BID, Maxum Cassarino Daniel Nones, MD .  furosemide (LASIX) tablet 40 mg, 40 mg, Oral, Daily, Lewayne Bunting, MD, 40 mg at 12/20/15 1048 .  hydrALAZINE (APRESOLINE) tablet 100 mg, 100 mg, Oral, 3 times per day, Lewayne Bunting, MD, 100 mg at 12/20/15 0569 .  isosorbide mononitrate (IMDUR) 24 hr tablet 60 mg, 60 mg, Oral, Daily, Lewayne Bunting, MD, 60 mg at 12/20/15 1048 .  lisinopril (PRINIVIL,ZESTRIL) tablet 40 mg, 40 mg, Oral, Daily, Lewayne Bunting, MD, 40 mg at 12/20/15  1048 .  methylPREDNISolone sodium succinate (SOLU-MEDROL) 125 mg/2 mL injection 125 mg, 125 mg, Intravenous, Q6H, Shawnell Dykes Daniel Nones, MD .  sodium chloride 0.9 % injection 3 mL, 3 mL, Intravenous, Q12H, Alberteen Sam, MD, 3 mL at 12/20/15 1000   ROS:                                                                                                                                       History obtained from the patient  General ROS: negative for - chills, fatigue, fever, night sweats, weight gain or weight loss Psychological ROS: negative for - behavioral disorder, hallucinations, memory difficulties, mood swings or suicidal ideation Ophthalmic ROS: negative for - blurry vision,  eye pain or loss of vision ENT ROS: negative for - epistaxis, nasal discharge, oral lesions, sore throat, tinnitus or vertigo Allergy and Immunology ROS: negative for - hives or itchy/watery eyes Hematological and Lymphatic ROS: negative for - bleeding problems, bruising or swollen lymph nodes Endocrine ROS: negative for - galactorrhea, hair pattern changes, polydipsia/polyuria or temperature intolerance Respiratory ROS: negative for - cough, hemoptysis, shortness of breath or wheezing Cardiovascular ROS: negative for - chest pain, dyspnea on exertion, edema or irregular heartbeat Gastrointestinal ROS: negative for - abdominal pain, diarrhea, hematemesis, nausea/vomiting or stool incontinence Genito-Urinary ROS: negative for - dysuria, hematuria, incontinence or urinary frequency/urgency Musculoskeletal ROS: negative for - joint swelling or muscular weakness Neurological ROS: as noted in HPI - has diplopia Dermatological ROS: negative for rash and skin lesion changes  Neurologic Examination:  Blood pressure 176/97, pulse 83, temperature 98.1 F (36.7 C), temperature source Oral, resp. rate 18, height 5'  7" (1.702 m), weight 96.163 kg (212 lb), SpO2 100 %.  Evaluation of higher integrative functions including: Level of alertness: Alert,  Oriented to time, place and person Recent and remote memory - intact   Attention span and concentration  - intact   Speech: fluent, no evidence of dysarthria or aphasia noted.  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  Gait: Deferred   Lab Results: Basic Metabolic Panel:  Recent Labs Lab 12/16/15 0221 12/17/15 0450 12/18/15 0344 12/19/15 0432 12/20/15 0318  NA 138 139 138 138 137  K 4.0 2.8* 3.2* 3.6 3.6  CL 95* 92* 98* 104 104  CO2 27 27 27 24 23   GLUCOSE 106* 110* 106* 91 103*  BUN 24* 21* 25* 26* 33*  CREATININE 2.16* 2.11* 2.07* 2.12* 2.17*  CALCIUM 8.6* 8.6* 9.1 9.0 8.9  MG  --   --   --  1.9  --     Liver Function Tests:  Recent Labs Lab 12/15/15 0400 12/16/15 0221 12/19/15 0432  AST 63* 53* 23  ALT 35 34 19  ALKPHOS 65 61 54  BILITOT 0.8 2.1* 0.7  PROT 7.4 7.1 7.3  ALBUMIN 3.6 3.5 3.2*   No results for input(s): LIPASE, AMYLASE in the last 168 hours. No results for input(s): AMMONIA in the last 168 hours.  CBC:  Recent Labs Lab 12/15/15 0400 12/15/15 1000 12/16/15 0221 12/19/15 0432  WBC 8.4 10.0 8.9 6.7  NEUTROABS 5.3  --   --   --   HGB 14.0 13.3 13.3 13.6  HCT 40.9 39.1 37.5* 40.8  MCV 90.5 90.5 88.9 90.9  PLT 292 251 277 297    Cardiac Enzymes:  Recent Labs Lab 12/15/15 1000 12/15/15 1301 12/15/15 1845  TROPONINI 0.24*  0.31* 0.22*    Lipid Panel: No results for input(s): CHOL, TRIG, HDL, CHOLHDL, VLDL, LDLCALC in the last 168 hours.  CBG: No results for input(s): GLUCAP in the last 168 hours.  Microbiology: Results for orders placed or performed during the hospital encounter of 12/15/15  MRSA PCR Screening     Status: None   Collection Time: 12/15/15  7:15 AM  Result Value Ref Range Status   MRSA by PCR NEGATIVE NEGATIVE Final    Comment:        The GeneXpert MRSA Assay (FDA approved for NASAL specimens only), is one component of a comprehensive MRSA colonization surveillance program. It is not intended to diagnose MRSA infection nor to guide or monitor treatment for MRSA infections.      Imaging: Mr Sherrin Daisy Contrast  12/19/2015  CLINICAL DATA:  Diplopia. EXAM: MRI HEAD WITHOUT CONTRAST TECHNIQUE: Multiplanar, multiecho pulse sequences of the brain and surrounding structures were obtained without intravenous contrast. COMPARISON:  CT head without contrast 03/17/2013. FINDINGS: The diffusion-weighted images demonstrate no evidence for acute or subacute infarction. No acute hemorrhage or mass lesion is present. Age advanced moderate periventricular white matter disease is present bilaterally. Mild generalized atrophy  is somewhat advanced for age as well. The ventricles are proportionate to the degree of atrophy. No significant extra-axial fluid collection is present. The internal auditory canals are within normal limits. Flow is present in the major intracranial arteries. Globes and orbits are intact. Minimal mucosal thickening is present along the floor of the left maxillary sinus. The skullbase is unremarkable. Midline sagittal images are within normal limits. IMPRESSION: 1. No acute intracranial abnormality. 2. Age advanced moderate periventricular and subcortical white matter disease bilaterally. The finding is nonspecific but can be seen in the setting of chronic microvascular ischemia, a  demyelinating process such as multiple sclerosis, vasculitis, complicated migraine headaches, or as the sequelae of a prior infectious or inflammatory process. 3. Mild generalized atrophy is also advanced for age. Electronically Signed   By: Marin Roberts M.D.   On: 12/19/2015 12:20   Nm Myocar Multi W/spect W/wall Motion / Ef  12/19/2015   Findings consistent with ischemia.  This is a high risk study.  The left ventricular ejection fraction is moderately decreased (30-44%).  Defect 1: There is a large defect of severe severity present in the basal inferior, basal inferolateral, basal anterolateral, mid inferior, mid inferolateral and mid anterolateral location.     Assessment and plan:   [name] is an 50 y.o. male patient with acute onset of binocular diplopia.  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  MRI of the brain was completed further neurodiagnostic workup. On my personal review, extensive white matter changes were seen,  the topography does raise some suspicion for possible primary demyelinating disease,  Although chronic hypertensive microvessel changes can have similar appearance.   given this suspicion for edema leading disease on brain MRI, recommend  A trial of solumedrol iv 125 mg q6h for at least 24 hours to see for any improvement in the 6 CN palsy.  check A1C, accuchecks, ordered pepcid 20 mg bid.   Switch asa to Plavix 75 mg qd.  Discussed assessment and plan with the primary hospitalist.  Neurology service will continue to follow up. Please call for any further questions.

## 2015-12-21 ENCOUNTER — Inpatient Hospital Stay (HOSPITAL_COMMUNITY): Payer: Self-pay

## 2015-12-21 DIAGNOSIS — H4920 Sixth [abducent] nerve palsy, unspecified eye: Secondary | ICD-10-CM | POA: Insufficient documentation

## 2015-12-21 DIAGNOSIS — H4922 Sixth [abducent] nerve palsy, left eye: Secondary | ICD-10-CM

## 2015-12-21 DIAGNOSIS — R079 Chest pain, unspecified: Secondary | ICD-10-CM | POA: Insufficient documentation

## 2015-12-21 LAB — GLUCOSE, CAPILLARY
GLUCOSE-CAPILLARY: 109 mg/dL — AB (ref 65–99)
Glucose-Capillary: 168 mg/dL — ABNORMAL HIGH (ref 65–99)

## 2015-12-21 LAB — CBC
HEMATOCRIT: 38.4 % — AB (ref 39.0–52.0)
Hemoglobin: 12.6 g/dL — ABNORMAL LOW (ref 13.0–17.0)
MCH: 30.1 pg (ref 26.0–34.0)
MCHC: 32.8 g/dL (ref 30.0–36.0)
MCV: 91.6 fL (ref 78.0–100.0)
PLATELETS: 276 10*3/uL (ref 150–400)
RBC: 4.19 MIL/uL — ABNORMAL LOW (ref 4.22–5.81)
RDW: 14.4 % (ref 11.5–15.5)
WBC: 5.8 10*3/uL (ref 4.0–10.5)

## 2015-12-21 LAB — BASIC METABOLIC PANEL
Anion gap: 9 (ref 5–15)
BUN: 30 mg/dL — ABNORMAL HIGH (ref 6–20)
CALCIUM: 8.5 mg/dL — AB (ref 8.9–10.3)
CO2: 22 mmol/L (ref 22–32)
CREATININE: 1.92 mg/dL — AB (ref 0.61–1.24)
Chloride: 106 mmol/L (ref 101–111)
GFR calc non Af Amer: 39 mL/min — ABNORMAL LOW (ref >60)
GFR, EST AFRICAN AMERICAN: 45 mL/min — AB (ref >60)
GLUCOSE: 103 mg/dL — AB (ref 65–99)
Potassium: 3.3 mmol/L — ABNORMAL LOW (ref 3.5–5.1)
Sodium: 137 mmol/L (ref 135–145)

## 2015-12-21 MED ORDER — SODIUM CHLORIDE 0.9% FLUSH: 3.0000 mL | INTRAVENOUS | Status: DC | PRN

## 2015-12-21 MED ORDER — SODIUM CHLORIDE 0.9 % IV SOLN: 250.0000 mL | INTRAVENOUS | Status: DC | PRN

## 2015-12-21 MED ORDER — LORAZEPAM 2 MG/ML IJ SOLN
0.5000 mg | Freq: Once | INTRAMUSCULAR | Status: DC
  Filled 2015-12-21: qty 1

## 2015-12-21 MED ORDER — SODIUM CHLORIDE 0.9% FLUSH
3.0000 mL | Freq: Two times a day (BID) | INTRAVENOUS | Status: DC
  Administered 2015-12-22: 3 mL via INTRAVENOUS

## 2015-12-21 MED ORDER — SODIUM CHLORIDE 0.9 % WEIGHT BASED INFUSION
1.0000 mL/kg/h | INTRAVENOUS | Status: DC
  Administered 2015-12-21: 1 mL/kg/h via INTRAVENOUS

## 2015-12-21 MED ORDER — ASPIRIN 81 MG PO CHEW
81.0000 mg | CHEWABLE_TABLET | ORAL | Status: AC
  Administered 2015-12-22: 81 mg via ORAL
  Filled 2015-12-21: qty 1

## 2015-12-21 NOTE — H&P (Signed)
   Pre cath evaluation reveals systolic HF, EF < 35%, high risk nuclear, severe hypertension, morbid obesity, tobacco, obesity, and CKD stage 4.  High risk for acute kidney failure.  Will discuss with patient prior to procedure.

## 2015-12-21 NOTE — Progress Notes (Signed)
Pt sent transport away again today. Per transporter Pt exclaims he doesn't care what medicine he is given he will not do another MRI. RN is aware. Please let us know if we can be of further assistance to this pt.

## 2015-12-21 NOTE — Progress Notes (Signed)
Patient continues to refuse MRI of brain MD informed.

## 2015-12-21 NOTE — Progress Notes (Signed)
PROGRESS NOTE  [name] ZOX:[id number] DOB: 1966-04-23 DOA: 12/15/2015 PCP: No PCP Per Patient  HPI: 50 y.o. male with a past medical history significant for NICM with EF 30%, HTN, and CKD without regular medical follow up who presents with dyspnea. The patient reports to me that he has had about 4 months of increasing dyspnea on exertion. This is also is accompanied by leg pain and a sharp chest pain that goes away with rest. 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 Now, in the last several weeks he has had worsening orthopnea, paroxysmal nocturnal dyspnea (waking up at night with dyspnea the last for about 10 minutes and goes away when he sits up and uses his wife's inhaler), ankle and wrist swelling, and then tonight he woke up with the dyspnea and it just wouldn't go away so he came to the ER.  Subjective / 24 H Interval events - ongoing double vision, no changes. Started on steroids per neurology - no chest pain / dyspnea  Assessment/Plan: Principal Problem:   Hypertensive emergency Active Problems:   Elevated troponin   Pulmonary edema   CKD (chronic kidney disease), stage III   Hepatitis   Acute on chronic systolic heart failure (HCC)   SOB (shortness of breath)   Chest pain   NSTEMI (non-ST elevated myocardial infarction) (HCC)   Abnormal stress test   Hypertensive emergency:  - patient initially on Nitroglycerin gtt, now discontinued and on oral medications - stable, BP much better but still elevated - currently on Norvasc 10, Coreg 12.5 BID, Lasix 40 daily, hydralazine 100 TID, Imdur 120 daily, Lisinopril 40 daily,   Horizontal diplopia due to left CN VI palsy - neurology consulted 1/28, 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 - Was briefly on BiPAP, tapered off currently on room air. - This is resolved probably with resolution of pulmonary edema.   Elevated troponin - cath on Monday   CKD stage III:  - Stable, baseline appears to be ~1.6-2.0 in the last few years. - closely monitor while on Lisinopril and Lasix - overall stable  Transaminitis - This is likely congestive hepatopathy secondary to the acute/chronic systolic CHF. - improved   Diet: Diet Heart Room service appropriate?: Yes; Fluid consistency:: Thin Fluids: none  DVT Prophylaxis: Lovenox  Code Status: Full Code Family Communication: no family bedside  Disposition Plan: home when ready   Barriers to discharge: cath Monday  Consultants:  Cardiology   Neurology   Procedures:  None    Antibiotics  Anti-infectives    None       Studies  Mr Brain Wo Contrast  12/19/2015  CLINICAL DATA:  Diplopia. EXAM: MRI HEAD WITHOUT CONTRAST TECHNIQUE: Multiplanar, multiecho pulse sequences of the brain and surrounding structures were obtained without intravenous contrast. COMPARISON:  CT head without contrast 03/17/2013. FINDINGS: The diffusion-weighted images demonstrate no evidence for acute or subacute infarction. No acute hemorrhage or mass lesion is present. Age advanced moderate periventricular white matter disease is present bilaterally. Mild generalized atrophy is somewhat advanced for age as well. The ventricles are proportionate to the degree of atrophy. No significant extra-axial fluid collection is  present. The internal auditory canals are within normal limits. Flow is present in the major intracranial arteries. Globes and orbits are intact. Minimal mucosal thickening is present along the floor of the left maxillary sinus. The skullbase is unremarkable. Midline sagittal images are within normal limits. IMPRESSION: 1. No acute  intracranial abnormality. 2. Age advanced moderate periventricular and subcortical white matter disease bilaterally. The finding is nonspecific but can be seen in the setting of chronic microvascular ischemia, a demyelinating process such as multiple sclerosis, vasculitis, complicated migraine headaches, or as the sequelae of a prior infectious or inflammatory process. 3. Mild generalized atrophy is also advanced for age. Electronically Signed   By: Marin Roberts M.D.   On: 12/19/2015 12:20   Nm Myocar Multi W/spect W/wall Motion / Ef  12/19/2015   Findings consistent with ischemia.  This is a high risk study.  The left ventricular ejection fraction is moderately decreased (30-44%).  Defect 1: There is a large defect of severe severity present in the basal inferior, basal inferolateral, basal anterolateral, mid inferior, mid inferolateral and mid anterolateral location.     Objective  Filed Vitals:   12/20/15 0500 12/20/15 1257 12/20/15 2028 12/21/15 0435  BP: 176/97 143/86 144/77 169/97  Pulse: 83 79 77 83  Temp: 98.1 F (36.7 C) 98 F (36.7 C) 99.1 F (37.3 C) 98.6 F (37 C)  TempSrc: Oral Oral Oral Oral  Resp: 18 20 20 20   Height:      Weight: 96.163 kg (212 lb)   96.299 kg (212 lb 4.8 oz)  SpO2: 100% 97% 100% 100%    Intake/Output Summary (Last 24 hours) at 12/21/15 0947 Last data filed at 12/21/15 0918  Gross per 24 hour  Intake    720 ml  Output    600 ml  Net    120 ml   Filed Weights   12/19/15 0628 12/20/15 0500 12/21/15 0435  Weight: 95.346 kg (210 lb 3.2 oz) 96.163 kg (212 lb) 96.299 kg (212 lb 4.8 oz)   Exam:  GENERAL: NAD  HEENT: no scleral icterus, EOMI, no nystagmus, PERRL  NECK: supple, no LAD  LUNGS: CTA biL, no wheezing  HEART: RRR without MRG  ABDOMEN: soft, non tender  EXTREMITIES: no clubbing / cyanosis  NEURO: non focal   Data Reviewed: Basic Metabolic Panel:  Recent Labs Lab 12/17/15 0450 12/18/15 0344 12/19/15 0432  12/20/15 0318 12/21/15 0423  NA 139 138 138 137 137  K 2.8* 3.2* 3.6 3.6 3.3*  CL 92* 98* 104 104 106  CO2 27 27 24 23 22   GLUCOSE 110* 106* 91 103* 103*  BUN 21* 25* 26* 33* 30*  CREATININE 2.11* 2.07* 2.12* 2.17* 1.92*  CALCIUM 8.6* 9.1 9.0 8.9 8.5*  MG  --   --  1.9  --   --    Liver Function Tests:  Recent Labs Lab 12/15/15 0400 12/16/15 0221 12/19/15 0432  AST 63* 53* 23  ALT 35 34 19  ALKPHOS 65 61 54  BILITOT 0.8 2.1* 0.7  PROT 7.4 7.1 7.3  ALBUMIN 3.6 3.5 3.2*   CBC:  Recent Labs Lab 12/15/15 0400 12/15/15 1000 12/16/15 0221 12/19/15 0432 12/21/15 0423  WBC 8.4 10.0 8.9 6.7 5.8  NEUTROABS 5.3  --   --   --   --   HGB 14.0 13.3 13.3 13.6 12.6*  HCT 40.9 39.1 37.5* 40.8 38.4*  MCV 90.5 90.5 88.9 90.9 91.6  PLT 292 251 277 297 276   Cardiac  Enzymes:  Recent Labs Lab 12/15/15 1000 12/15/15 1301 12/15/15 1845  TROPONINI 0.24* 0.31* 0.22*   BNP (last 3 results)  Recent Labs  12/15/15 0400  BNP 683.3*    Recent Results (from the past 240 hour(s))  MRSA PCR Screening     Status: None   Collection Time: 12/15/15  7:15 AM  Result Value Ref Range Status   MRSA by PCR NEGATIVE NEGATIVE Final    Comment:        The GeneXpert MRSA Assay (FDA approved for NASAL specimens only), is one component of a comprehensive MRSA colonization surveillance program. It is not intended to diagnose MRSA infection nor to guide or monitor treatment for MRSA infections.      Scheduled Meds: . amLODipine  10 mg Oral Daily  . atorvastatin  80 mg Oral q1800  . carvedilol  12.5 mg Oral BID WC  . clopidogrel  75 mg Oral Daily  . enoxaparin (LOVENOX) injection  40 mg Subcutaneous Daily  . famotidine  20 mg Oral BID  . furosemide  40 mg Oral Daily  . hydrALAZINE  100 mg Oral 3 times per day  . isosorbide mononitrate  120 mg Oral Daily  . lisinopril  40 mg Oral Daily  . methylPREDNISolone (SOLU-MEDROL) injection  125 mg Intravenous Q6H  . sodium chloride  3 mL  Intravenous Q12H   Continuous Infusions:   Pamella Pert, MD [facility] Pager [phone]. If 7 PM - 7 AM, please contact night-coverage at [email or web address], password [facility] 12/21/2015, 9:47 AM  LOS: 6 days

## 2015-12-21 NOTE — Progress Notes (Signed)
 Patient Name: [name] Date of Encounter: 12/21/2015   SUBJECTIVE  Feeling well. No chest pain, sob or palpitations. BP still running high.   CURRENT MEDS . amLODipine  10 mg Oral Daily  . atorvastatin  80 mg Oral q1800  . carvedilol  12.5 mg Oral BID WC  . clopidogrel  75 mg Oral Daily  . enoxaparin (LOVENOX) injection  40 mg Subcutaneous Daily  . famotidine  20 mg Oral BID  . furosemide  40 mg Oral Daily  . hydrALAZINE  100 mg Oral 3 times per day  . isosorbide mononitrate  120 mg Oral Daily  . lisinopril  40 mg Oral Daily  . LORazepam  0.5 mg Intravenous Once  . methylPREDNISolone (SOLU-MEDROL) injection  125 mg Intravenous Q6H  . sodium chloride  3 mL Intravenous Q12H    OBJECTIVE  Filed Vitals:   12/20/15 0500 12/20/15 1257 12/20/15 2028 12/21/15 0435  BP: 176/97 143/86 144/77 169/97  Pulse: 83 79 77 83  Temp: 98.1 F (36.7 C) 98 F (36.7 C) 99.1 F (37.3 C) 98.6 F (37 C)  TempSrc: Oral Oral Oral Oral  Resp: 18 20 20 20  Height:      Weight: 212 lb (96.163 kg)   212 lb 4.8 oz (96.299 kg)  SpO2: 100% 97% 100% 100%    Intake/Output Summary (Last 24 hours) at 12/21/15 1240 Last data filed at 12/21/15 0918  Gross per 24 hour  Intake    720 ml  Output    600 ml  Net    120 ml   Filed Weights   12/19/15 0628 12/20/15 0500 12/21/15 0435  Weight: 210 lb 3.2 oz (95.346 kg) 212 lb (96.163 kg) 212 lb 4.8 oz (96.299 kg)    PHYSICAL EXAM  General: Pleasant, NAD. Neuro: Alert and oriented X 3. Moves all extremities spontaneously. Psych: Normal affect. HEENT:  Normal  Neck: Supple without bruits or JVD. Lungs:  Resp regular and unlabored, CTA. Heart: RRR no s3, s4, or murmurs. Abdomen: Soft, non-tender, non-distended, BS + x 4.  Extremities: No clubbing, cyanosis or edema. DP/PT/Radials 2+ and equal bilaterally.  Accessory Clinical Findings  CBC  Recent Labs  12/19/15 0432 12/21/15 0423  WBC 6.7 5.8  HGB 13.6 12.6*  HCT 40.8 38.4*  MCV 90.9  91.6  PLT 297 276   Basic Metabolic Panel  Recent Labs  12/19/15 0432 12/20/15 0318 12/21/15 0423  NA 138 137 137  K 3.6 3.6 3.3*  CL 104 104 106  CO2 24 23 22  GLUCOSE 91 103* 103*  BUN 26* 33* 30*  CREATININE 2.12* 2.17* 1.92*  CALCIUM 9.0 8.9 8.5*  MG 1.9  --   --    Liver Function Tests  Recent Labs  12/19/15 0432  AST 23  ALT 19  ALKPHOS 54  BILITOT 0.7  PROT 7.3  ALBUMIN 3.2*    TELE  Unable to review as patient seen in nuc med.   Radiology/Studies  Mr Brain Wo Contrast  12/19/2015  CLINICAL DATA:  Diplopia.  IMPRESSION: 1. No acute intracranial abnormality. 2. Age advanced moderate periventricular and subcortical white matter disease bilaterally. The finding is nonspecific but can be seen in the setting of chronic microvascular ischemia, a demyelinating process such as multiple sclerosis, vasculitis, complicated migraine headaches, or as the sequelae of a prior infectious or inflammatory process. 3. Mild generalized atrophy is also advanced for age. Electronically Signed   By: Christopher  Mattern M.D.   On:   12/19/2015 12:20   Nm Myocar Multi W/spect W/wall Motion / Ef  12/19/2015   Findings consistent with ischemia.  This is a high risk study.  The left ventricular ejection fraction is moderately decreased (30-44%).  Defect 1: There is a large defect of severe severity present in the basal inferior, basal inferolateral, basal anterolateral, mid inferior, mid inferolateral and mid anterolateral location.    Dg Chest Port 1 View  12/15/2015  CLINICAL DATA:  Acute onset of respiratory distress. Initial encounter. EXAM: PORTABLE CHEST 1 VIEW COMPARISON:  Chest radiograph performed 09/08/2014 FINDINGS: The lungs are well-aerated. Vascular congestion is noted. Mildly increased interstitial markings may reflect minimal interstitial edema. There is no evidence of pleural effusion or pneumothorax. The cardiomediastinal silhouette is enlarged. No acute osseous  abnormalities are seen. IMPRESSION: Vascular congestion and cardiomegaly. Mildly increased interstitial markings may reflect minimal interstitial edema. Electronically Signed   By: Roanna Raider M.D.   On: 12/15/2015 04:26   Nuclear stress test; 12/19/2015  Findings consistent with ischemia.  This is a high risk study.  The left ventricular ejection fraction is moderately decreased (30-44%).  Defect 1: There is a large defect of severe severity present in the basal inferior, basal inferolateral, basal anterolateral, mid inferior, mid inferolateral and mid anterolateral location.    ASSESSMENT AND PLAN  1. Hypertensive emergency - BP still running high despite increase in lisinopril ,amlodipine and imdur. Currently on Norvasc 10mg  qd, coreg 12.5 mg BID, hydralazine 100mg  TID, imdur 60mg  qd, lisinopril 40mg  qd and lasix 40mg  qd.  - Will discuss with MD. 2nd HTN workup?  - increase Imdur to 120 mg po daily.   2. Hx of NICM  - likely for uncontrolled HTN. As above  3. acute on chronic systolic congestive heart failure - Echo 12/15/15 showed EF of 20-25% with severe diffuse hypokiness, grade  3 DD. - Diuresed negative 4.9 L with 5lb weight loss. Creatinine improving. Appears euvolemic. Continue current dose of lasix.   4. Unstable angina - Currently chest pain free. Flat troponin trend. Echo with severely reduced EF. High risk stress test with a large area of ischemia in the inferior and inferolateral walls.   - Risk for contrast nephropathy, crea is improving, we will hold lasix.   5. CKD (chronic kidney disease), stage III - Currently stable. Seems baseline creatinine of 1.5-2.0.  6. Cocaine abuse - hx of this. UDS + for cocaine this admission.   7. Hypokalemia - Resolved today. Continue supplement with diuresis.    Lars Masson

## 2015-12-21 NOTE — Progress Notes (Signed)
Interval History:                                                                                                                      [name] is an 50 y.o. 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   Past Medical History: Past Medical History  Diagnosis Date  . CAD (coronary artery disease)     Small vessel with occluded small OM and nonobstructive large vessel disease (cath 02/2014)  . Obesity   . History of syncope 08/2008    Secondary to hypertension  . Tobacco abuse   . Hypertension, essential, benign     a. renal art [place] (6/14): no RAS  . Noncompliance   . CKD (chronic kidney disease)   . Nonischemic cardiomyopathy (HCC)     EF 45%  . Sleep apnea     Past Surgical History  Procedure Laterality Date  . None    . Left heart catheterization with coronary angiogram N/A 02/21/2014    Procedure: LEFT HEART CATHETERIZATION WITH CORONARY ANGIOGRAM;  Surgeon: Kathleene Hazel, MD;  Location: [facility] CATH LAB;  Service: Cardiovascular;  Laterality: N/A;    Family History: Family History  Problem Relation Age of Onset  . Lupus Mother   . Heart disease Maternal Grandfather   . Coronary artery disease Neg Hx     Social History:   reports that he has been smoking Cigarettes.  He has a 3.2 pack-year smoking history. He does not have any smokeless tobacco history on file. He reports that he does not drink alcohol or use illicit drugs.  Allergies:  Allergies  Allergen Reactions  . Penicillins Other (See Comments)    Patient states that it makes his throat itch really bad, and feels like it is closing up.   . Shrimp [Shellfish Allergy]     Throat swells     Medications:                                                                                                                         Current facility-administered medications:  .  acetaminophen (TYLENOL) tablet 650 mg, 650 mg, Oral, Q6H PRN, 650 mg at 12/18/15 0615 **OR** acetaminophen  (TYLENOL) suppository 650 mg, 650 mg, Rectal, Q6H PRN, Alberteen Sam, MD .  amLODipine (NORVASC) tablet 10 mg, 10 mg, Oral, Daily, Lewayne Bunting, MD, 10 mg at 12/21/15 1055 .  atorvastatin (LIPITOR) tablet 80 mg, 80 mg, Oral,  [id number], Lewayne Bunting, MD, 80 mg at 12/21/15 1800 .  carvedilol (COREG) tablet 12.5 mg, 12.5 mg, Oral, BID WC, Lewayne Bunting, MD, 12.5 mg at 12/21/15 1700 .  clopidogrel (PLAVIX) tablet 75 mg, 75 mg, Oral, Daily, Raven Harmes Daniel Nones, MD, 75 mg at 12/21/15 1056 .  enoxaparin (LOVENOX) injection 40 mg, 40 mg, Subcutaneous, Daily, Alberteen Sam, MD, 40 mg at 12/21/15 1000 .  famotidine (PEPCID) tablet 20 mg, 20 mg, Oral, BID, Aneyah Lortz Daniel Nones, MD, 20 mg at 12/21/15 1056 .  furosemide (LASIX) tablet 40 mg, 40 mg, Oral, Daily, Lewayne Bunting, MD, 40 mg at 12/21/15 1056 .  hydrALAZINE (APRESOLINE) tablet 100 mg, 100 mg, Oral, 3 times per day, Lewayne Bunting, MD, 100 mg at 12/21/15 1400 .  isosorbide mononitrate (IMDUR) 24 hr tablet 120 mg, 120 mg, Oral, Daily, Lars Masson, MD, 120 mg at 12/21/15 1055 .  lisinopril (PRINIVIL,ZESTRIL) tablet 40 mg, 40 mg, Oral, Daily, Lewayne Bunting, MD, 40 mg at 12/21/15 1000 .  LORazepam (ATIVAN) injection 0.5 mg, 0.5 mg, Intravenous, Once, Benecio Kluger Daniel Nones, MD, 0.5 mg at 12/21/15 1215 .  methylPREDNISolone sodium succinate (SOLU-MEDROL) 125 mg/2 mL injection 125 mg, 125 mg, Intravenous, Q6H, Ahria Slappey Daniel Nones, MD, 125 mg at 12/21/15 1400 .  sodium chloride 0.9 % injection 3 mL, 3 mL, Intravenous, Q12H, Alberteen Sam, MD, 3 mL at 12/21/15 1000   Neurologic Examination:                                                                                                      Blood pressure 134/66, pulse 80, temperature 97.7 F (36.5 C), temperature source Oral, resp. rate 20, height 5\' 7"  (1.702 m), weight 96.299 kg (212 lb 4.8 oz), SpO2 92 %.  Evaluation of higher  integrative functions including: Level of alertness: Alert,  Oriented to time, place and person Attention span and concentration  - intact   Speech: fluent, no evidence of dysarthria or aphasia noted.  Test the following cranial nerves: 2-5, 7-12 grossly intact. Left sixth nerve palsy, unchanged Motor examination: Normal tone, bulk, full 5/5 motor strength in all 4 extremities Examination of sensation : Normal and symmetric sensation to pinprick in all 4 extremities and on face Examination of deep tendon reflexes: 2+, normal and symmetric in all extremities, normal plantars bilaterally Test coordination: Normal finger nose testing, with no evidence of limb appendicular ataxia or abnormal involuntary movements or tremors noted.  Gait: Deferred   Lab Results: Basic Metabolic Panel:  Recent Labs Lab 12/17/15 0450 12/18/15 0344 12/19/15 0432 12/20/15 0318 12/21/15 0423  NA 139 138 138 137 137  K 2.8* 3.2* 3.6 3.6 3.3*  CL 92* 98* 104 104 106  CO2 27 27 24 23 22   GLUCOSE 110* 106* 91 103* 103*  BUN 21* 25* 26* 33* 30*  CREATININE 2.11* 2.07* 2.12* 2.17* 1.92*  CALCIUM 8.6* 9.1 9.0 8.9 8.5*  MG  --   --  1.9  --   --     Liver  Function Tests:  Recent Labs Lab 12/15/15 0400 12/16/15 0221 12/19/15 0432  AST 63* 53* 23  ALT 35 34 19  ALKPHOS 65 61 54  BILITOT 0.8 2.1* 0.7  PROT 7.4 7.1 7.3  ALBUMIN 3.6 3.5 3.2*   No results for input(s): LIPASE, AMYLASE in the last 168 hours. No results for input(s): AMMONIA in the last 168 hours.  CBC:  Recent Labs Lab 12/15/15 0400 12/15/15 1000 12/16/15 0221 12/19/15 0432 12/21/15 0423  WBC 8.4 10.0 8.9 6.7 5.8  NEUTROABS 5.3  --   --   --   --   HGB 14.0 13.3 13.3 13.6 12.6*  HCT 40.9 39.1 37.5* 40.8 38.4*  MCV 90.5 90.5 88.9 90.9 91.6  PLT 292 251 277 297 276    Cardiac Enzymes:  Recent Labs Lab 12/15/15 1000 12/15/15 1301 12/15/15 1845  TROPONINI 0.24* 0.31* 0.22*    Lipid Panel: No results for input(s):  CHOL, TRIG, HDL, CHOLHDL, VLDL, LDLCALC in the last 168 hours.  CBG:  Recent Labs Lab 12/21/15 1747  GLUCAP 168*    Microbiology: Results for orders placed or performed during the hospital encounter of 12/15/15  MRSA PCR Screening     Status: None   Collection Time: 12/15/15  7:15 AM  Result Value Ref Range Status   MRSA by PCR NEGATIVE NEGATIVE Final    Comment:        The GeneXpert MRSA Assay (FDA approved for NASAL specimens only), is one component of a comprehensive MRSA colonization surveillance program. It is not intended to diagnose MRSA infection nor to guide or monitor treatment for MRSA infections.     Imaging: Mr Sherrin Daisy Contrast  12/19/2015  CLINICAL DATA:  Diplopia. EXAM: MRI HEAD WITHOUT CONTRAST TECHNIQUE: Multiplanar, multiecho pulse sequences of the brain and surrounding structures were obtained without intravenous contrast. COMPARISON:  CT head without contrast 03/17/2013. FINDINGS: The diffusion-weighted images demonstrate no evidence for acute or subacute infarction. No acute hemorrhage or mass lesion is present. Age advanced moderate periventricular white matter disease is present bilaterally. Mild generalized atrophy is somewhat advanced for age as well. The ventricles are proportionate to the degree of atrophy. No significant extra-axial fluid collection is present. The internal auditory canals are within normal limits. Flow is present in the major intracranial arteries. Globes and orbits are intact. Minimal mucosal thickening is present along the floor of the left maxillary sinus. The skullbase is unremarkable. Midline sagittal images are within normal limits. IMPRESSION: 1. No acute intracranial abnormality. 2. Age advanced moderate periventricular and subcortical white matter disease bilaterally. The finding is nonspecific but can be seen in the setting of chronic microvascular ischemia, a demyelinating process such as multiple sclerosis, vasculitis,  complicated migraine headaches, or as the sequelae of a prior infectious or inflammatory process. 3. Mild generalized atrophy is also advanced for age. Electronically Signed   By: Marin Roberts M.D.   On: 12/19/2015 12:20   Nm Myocar Multi W/spect W/wall Motion / Ef  12/19/2015   Findings consistent with ischemia.  This is a high risk study.  The left ventricular ejection fraction is moderately decreased (30-44%).  Defect 1: There is a large defect of severe severity present in the basal inferior, basal inferolateral, basal anterolateral, mid inferior, mid inferolateral and mid anterolateral location.     Assessment and plan:   [name] is an 50 y.o. 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 Patient refused contrast MRI of the brain due to  claustrophobia.   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 Discontinue steroids. He is on Plavix 75 mg daily.   Follow-up with outpatient ophthalmologist in 6 weeks. If no significant improvement at that time, would benefit from corrective prisms. Discussed with primary hospitalist.    No further neurological recommendations at this time. Will sign off. Please call for any further questions

## 2015-12-22 ENCOUNTER — Encounter (HOSPITAL_COMMUNITY): Payer: Self-pay | Admitting: Interventional Cardiology

## 2015-12-22 ENCOUNTER — Encounter (HOSPITAL_COMMUNITY)
Admission: EM | Disposition: A | Discharge status: Home / Self Care | Payer: Self-pay | Source: Home / Self Care | Attending: Internal Medicine

## 2015-12-22 DIAGNOSIS — G4733 Obstructive sleep apnea (adult) (pediatric): Secondary | ICD-10-CM

## 2015-12-22 DIAGNOSIS — I2510 Atherosclerotic heart disease of native coronary artery without angina pectoris: Secondary | ICD-10-CM

## 2015-12-22 HISTORY — PX: CARDIAC CATHETERIZATION: SHX172

## 2015-12-22 LAB — BASIC METABOLIC PANEL
ANION GAP: 11 (ref 5–15)
BUN: 31 mg/dL — AB (ref 6–20)
CALCIUM: 9.1 mg/dL (ref 8.9–10.3)
CO2: 21 mmol/L — ABNORMAL LOW (ref 22–32)
Chloride: 106 mmol/L (ref 101–111)
Creatinine, Ser: 1.98 mg/dL — ABNORMAL HIGH (ref 0.61–1.24)
GFR calc Af Amer: 44 mL/min — ABNORMAL LOW (ref >60)
GFR, EST NON AFRICAN AMERICAN: 38 mL/min — AB (ref >60)
Glucose, Bld: 137 mg/dL — ABNORMAL HIGH (ref 65–99)
POTASSIUM: 3.8 mmol/L (ref 3.5–5.1)
SODIUM: 138 mmol/L (ref 135–145)

## 2015-12-22 LAB — PROTIME-INR
INR: >10 (ref 0.00–1.49)
INR: 1.09 (ref 0.00–1.49)
PROTHROMBIN TIME: 14.3 s (ref 11.6–15.2)

## 2015-12-22 LAB — CREATININE, SERUM
Creatinine, Ser: 2.06 mg/dL — ABNORMAL HIGH (ref 0.61–1.24)
GFR calc non Af Amer: 36 mL/min — ABNORMAL LOW (ref >60)
GFR, EST AFRICAN AMERICAN: 42 mL/min — AB (ref >60)

## 2015-12-22 LAB — GLUCOSE, CAPILLARY
GLUCOSE-CAPILLARY: 140 mg/dL — AB (ref 65–99)
Glucose-Capillary: 139 mg/dL — ABNORMAL HIGH (ref 65–99)
Glucose-Capillary: 137 mg/dL — ABNORMAL HIGH (ref 65–99)
Glucose-Capillary: 117 mg/dL — ABNORMAL HIGH (ref 65–99)

## 2015-12-22 LAB — CBC
HCT: 37 % — ABNORMAL LOW (ref 39.0–52.0)
Hemoglobin: 12.5 g/dL — ABNORMAL LOW (ref 13.0–17.0)
MCH: 30.5 pg (ref 26.0–34.0)
MCHC: 33.8 g/dL (ref 30.0–36.0)
MCV: 90.2 fL (ref 78.0–100.0)
PLATELETS: 305 10*3/uL (ref 150–400)
RBC: 4.1 MIL/uL — ABNORMAL LOW (ref 4.22–5.81)
RDW: 14.2 % (ref 11.5–15.5)
WBC: 9.4 10*3/uL (ref 4.0–10.5)

## 2015-12-22 LAB — HEMOGLOBIN A1C
HEMOGLOBIN A1C: 5.8 % — AB (ref 4.8–5.6)
MEAN PLASMA GLUCOSE: 120 mg/dL

## 2015-12-22 SURGERY — LEFT HEART CATH AND CORONARY ANGIOGRAPHY
Anesthesia: LOCAL

## 2015-12-22 MED ORDER — IOHEXOL 350 MG/ML SOLN
INTRAVENOUS | Status: DC | PRN
  Administered 2015-12-22: 65 mL via INTRAVENOUS

## 2015-12-22 MED ORDER — ACETAMINOPHEN 325 MG PO TABS: 650.0000 mg | ORAL_TABLET | ORAL | Status: DC | PRN

## 2015-12-22 MED ORDER — SODIUM CHLORIDE 0.9% FLUSH: 3.0000 mL | INTRAVENOUS | Status: DC | PRN

## 2015-12-22 MED ORDER — SODIUM CHLORIDE 0.9% FLUSH
3.0000 mL | Freq: Two times a day (BID) | INTRAVENOUS | Status: DC
  Administered 2015-12-23: 3 mL via INTRAVENOUS

## 2015-12-22 MED ORDER — SODIUM CHLORIDE 0.9 % IV SOLN
INTRAVENOUS | Status: AC
  Administered 2015-12-22: 13:00:00 via INTRAVENOUS

## 2015-12-22 MED ORDER — LIDOCAINE HCL (PF) 1 % IJ SOLN
INTRAMUSCULAR | Status: DC | PRN
  Administered 2015-12-22: 12:00:00

## 2015-12-22 MED ORDER — VERAPAMIL HCL 2.5 MG/ML IV SOLN
INTRAVENOUS | Status: AC
  Filled 2015-12-22: qty 2

## 2015-12-22 MED ORDER — LIDOCAINE HCL (PF) 1 % IJ SOLN
INTRAMUSCULAR | Status: AC
  Filled 2015-12-22: qty 30

## 2015-12-22 MED ORDER — ENOXAPARIN SODIUM 40 MG/0.4ML ~~LOC~~ SOLN
40.0000 mg | SUBCUTANEOUS | Status: DC
  Administered 2015-12-23: 40 mg via SUBCUTANEOUS
  Filled 2015-12-22: qty 0.4

## 2015-12-22 MED ORDER — VERAPAMIL HCL 2.5 MG/ML IV SOLN
INTRAVENOUS | Status: DC | PRN
  Administered 2015-12-22: 10 mL via INTRA_ARTERIAL

## 2015-12-22 MED ORDER — OXYCODONE-ACETAMINOPHEN 5-325 MG PO TABS: 1.0000 | ORAL_TABLET | ORAL | Status: DC | PRN

## 2015-12-22 MED ORDER — ONDANSETRON HCL 4 MG/2ML IJ SOLN: 4.0000 mg | Freq: Four times a day (QID) | INTRAMUSCULAR | Status: DC | PRN

## 2015-12-22 MED ORDER — SODIUM CHLORIDE 0.9 % IV SOLN: 250.0000 mL | INTRAVENOUS | Status: DC | PRN

## 2015-12-22 MED ORDER — HEPARIN (PORCINE) IN NACL 2-0.9 UNIT/ML-% IJ SOLN
INTRAMUSCULAR | Status: AC
  Filled 2015-12-22: qty 1500

## 2015-12-22 SURGICAL SUPPLY — 6 items
CATH INFINITI 5FR MULTPACK ANG (CATHETERS) ×2 IMPLANT
DEVICE WIRE ANGIOSEAL 6FR (Vascular Products) ×2 IMPLANT
KIT HEART LEFT (KITS) ×2 IMPLANT
PACK CARDIAC CATHETERIZATION (CUSTOM PROCEDURE TRAY) ×2 IMPLANT
SHEATH PINNACLE 5F 10CM (SHEATH) ×2 IMPLANT
WIRE EMERALD 3MM-J .035X150CM (WIRE) ×2 IMPLANT

## 2015-12-22 NOTE — H&P (View-Only) (Signed)
Patient Name: [name] Date of Encounter: 12/21/2015   SUBJECTIVE  Feeling well. No chest pain, sob or palpitations. BP still running high.   CURRENT MEDS . amLODipine  10 mg Oral Daily  . atorvastatin  80 mg Oral q1800  . carvedilol  12.5 mg Oral BID WC  . clopidogrel  75 mg Oral Daily  . enoxaparin (LOVENOX) injection  40 mg Subcutaneous Daily  . famotidine  20 mg Oral BID  . furosemide  40 mg Oral Daily  . hydrALAZINE  100 mg Oral 3 times per day  . isosorbide mononitrate  120 mg Oral Daily  . lisinopril  40 mg Oral Daily  . LORazepam  0.5 mg Intravenous Once  . methylPREDNISolone (SOLU-MEDROL) injection  125 mg Intravenous Q6H  . sodium chloride  3 mL Intravenous Q12H    OBJECTIVE  Filed Vitals:   12/20/15 0500 12/20/15 1257 12/20/15 2028 12/21/15 0435  BP: 176/97 143/86 144/77 169/97  Pulse: 83 79 77 83  Temp: 98.1 F (36.7 C) 98 F (36.7 C) 99.1 F (37.3 C) 98.6 F (37 C)  TempSrc: Oral Oral Oral Oral  Resp: Height:      Weight: 212 lb (96.163 kg)   212 lb 4.8 oz (96.299 kg)  SpO2: 100% 97% 100% 100%    Intake/Output Summary (Last 24 hours) at 12/21/15 1240 Last data filed at 12/21/15 0918  Gross per 24 hour  Intake    720 ml  Output    600 ml  Net    120 ml   Filed Weights   12/19/15 0628 12/20/15 0500 12/21/15 0435  Weight: 210 lb 3.2 oz (95.346 kg) 212 lb (96.163 kg) 212 lb 4.8 oz (96.299 kg)    PHYSICAL EXAM  General: Pleasant, NAD. Neuro: Alert and oriented X 3. Moves all extremities spontaneously. Psych: Normal affect. HEENT:  Normal  Neck: Supple without bruits or JVD. Lungs:  Resp regular and unlabored, CTA. Heart: RRR no s3, s4, or murmurs. Abdomen: Soft, non-tender, non-distended, BS + x 4.  Extremities: No clubbing, cyanosis or edema. DP/PT/Radials 2+ and equal bilaterally.  Accessory Clinical Findings  CBC  Recent Labs  12/19/15 0432 12/21/15 0423  WBC 6.7 5.8  HGB 13.6 12.6*  HCT 40.8 38.4*  MCV 90.9  91.6  PLT 297 276   Basic Metabolic Panel  Recent Labs  12/19/15 0432 12/20/15 0318 12/21/15 0423  NA 138 137 137  K 3.6 3.6 3.3*  CL 104 104 106  CO2 GLUCOSE 91 103* 103*  BUN 26* 33* 30*  CREATININE 2.12* 2.17* 1.92*  CALCIUM 9.0 8.9 8.5*  MG 1.9  --   --    Liver Function Tests  Recent Labs  12/19/15 0432  AST 23  ALT 19  ALKPHOS 54  BILITOT 0.7  PROT 7.3  ALBUMIN 3.2*    TELE  Unable to review as patient seen in nuc med.   Radiology/Studies  Mr Brain Wo Contrast  12/19/2015  CLINICAL DATA:  Diplopia.  IMPRESSION: 1. No acute intracranial abnormality. 2. Age advanced moderate periventricular and subcortical white matter disease bilaterally. The finding is nonspecific but can be seen in the setting of chronic microvascular ischemia, a demyelinating process such as multiple sclerosis, vasculitis, complicated migraine headaches, or as the sequelae of a prior infectious or inflammatory process. 3. Mild generalized atrophy is also advanced for age. Electronically Signed   By: Marin Roberts M.D.   On:  12/19/2015 12:20   Nm Myocar Multi W/spect W/wall Motion / Ef  12/19/2015   Findings consistent with ischemia.  This is a high risk study.  The left ventricular ejection fraction is moderately decreased (30-44%).  Defect 1: There is a large defect of severe severity present in the basal inferior, basal inferolateral, basal anterolateral, mid inferior, mid inferolateral and mid anterolateral location.    Dg Chest Port 1 View  12/15/2015  CLINICAL DATA:  Acute onset of respiratory distress. Initial encounter. EXAM: PORTABLE CHEST 1 VIEW COMPARISON:  Chest radiograph performed 09/08/2014 FINDINGS: The lungs are well-aerated. Vascular congestion is noted. Mildly increased interstitial markings may reflect minimal interstitial edema. There is no evidence of pleural effusion or pneumothorax. The cardiomediastinal silhouette is enlarged. No acute osseous  abnormalities are seen. IMPRESSION: Vascular congestion and cardiomegaly. Mildly increased interstitial markings may reflect minimal interstitial edema. Electronically Signed   By: Roanna Raider M.D.   On: 12/15/2015 04:26   Nuclear stress test; 12/19/2015  Findings consistent with ischemia.  This is a high risk study.  The left ventricular ejection fraction is moderately decreased (30-44%).  Defect 1: There is a large defect of severe severity present in the basal inferior, basal inferolateral, basal anterolateral, mid inferior, mid inferolateral and mid anterolateral location.    ASSESSMENT AND PLAN  1. Hypertensive emergency - BP still running high despite increase in lisinopril ,amlodipine and imdur. Currently on Norvasc 10mg  qd, coreg 12.5 mg BID, hydralazine 100mg  TID, imdur 60mg  qd, lisinopril 40mg  qd and lasix 40mg  qd.  - Will discuss with MD. 2nd HTN workup?  - increase Imdur to 120 mg po daily.   2. Hx of NICM  - likely for uncontrolled HTN. As above  3. acute on chronic systolic congestive heart failure - Echo 12/15/15 showed EF of 20-25% with severe diffuse hypokiness, grade  3 DD. - Diuresed negative 4.9 L with 5lb weight loss. Creatinine improving. Appears euvolemic. Continue current dose of lasix.   4. Unstable angina - Currently chest pain free. Flat troponin trend. Echo with severely reduced EF. High risk stress test with a large area of ischemia in the inferior and inferolateral walls.   - Risk for contrast nephropathy, crea is improving, we will hold lasix.   5. CKD (chronic kidney disease), stage III - Currently stable. Seems baseline creatinine of 1.5-2.0.  6. Cocaine abuse - hx of this. UDS + for cocaine this admission.   7. Hypokalemia - Resolved today. Continue supplement with diuresis.    Lars Masson

## 2015-12-22 NOTE — Progress Notes (Signed)
PROGRESS NOTE  [name] KCC:[id number] DOB: 01-Apr-1966 DOA: 12/15/2015 PCP: No PCP Per Patient  HPI: 50 y.o. male with a past medical history significant for NICM with EF 30%, HTN, and CKD without regular medical follow up who presents with dyspnea. The patient reports to me that he has had about 4 months of increasing dyspnea on exertion. This is also is accompanied by leg pain and a sharp chest pain that goes away with rest. 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 Now, in the last several weeks he has had worsening orthopnea, paroxysmal nocturnal dyspnea (waking up at night with dyspnea the last for about 10 minutes and goes away when he sits up and uses his wife's inhaler), ankle and wrist swelling, and then tonight he woke up with the dyspnea and it just wouldn't go away so he came to the ER.  Subjective / 24 H Interval events - ongoing double vision, no changes. Started on steroids per neurology - no chest pain / dyspnea  Assessment/Plan: Principal Problem:   Hypertensive emergency Active Problems:   Elevated troponin   Pulmonary edema   CKD (chronic kidney disease), stage III   Hepatitis   Acute on chronic systolic heart failure (HCC)   SOB (shortness of breath)   Chest pain   NSTEMI (non-ST elevated myocardial infarction) (HCC)   Abnormal stress test   Pain in the chest   Cranial nerve VI palsy   Hypertensive emergency:  - patient initially on Nitroglycerin gtt, now discontinued and on oral medications - stable, BP much better but still elevated - currently on Norvasc 10, Coreg 12.5 BID, Lasix 40 daily, hydralazine 100 TID, Imdur 120 daily, Lisinopril 40 daily,   Horizontal diplopia due to left CN VI palsy - neurology consulted 1/28, 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 Per cardiology, will continue medical management for now and PCI if symptoms recur.  Acute hypoxic respiratory failure due to Pulmonary edema in the setting of Acute on chronic systolic CHF. - Was briefly on BiPAP, tapered off currently on room air. - This is resolved probably with resolution of pulmonary edema.   CKD stage III:  - Stable, baseline appears to be ~1.6-2.0 in the last few years. - closely monitor post cardiac catheterization, gentle fluids overnight  Transaminitis - This is likely congestive hepatopathy secondary to the acute/chronic systolic CHF. - improved   Diet: Diet Heart Room service appropriate?: Yes; Fluid consistency:: Thin Fluids: none  DVT Prophylaxis: Lovenox  Code Status: Full Code Family Communication: no family bedside  Disposition Plan: home when ready   Barriers to discharge: Post cath observation  Consultants:  Cardiology   Neurology   Procedures:  Cardiac catheterization 1/30  1. Ost 1st Mrg to 1st Mrg lesion, 90% stenosed. 2. Lat Ramus lesion, 100% stenosed. 3. Ost LAD to Mid LAD lesion, 50% stenosed. 4. Prox Cx to Mid Cx lesion, 70% stenosed. 5. Dist LAD lesion, 80% stenosed. 6. Prox RCA to Mid RCA lesion, 65% stenosed.   Antibiotics  Anti-infectives    None      Studies  No results found.  Objective  Filed Vitals:   12/22/15 1403 12/22/15 1500 12/22/15 1530 12/22/15 1532  BP:  171/106 179/104 165/99  Pulse: 81 85 75  88  Temp:  97.7 F (36.5 C)    TempSrc:      Resp:  18    Height:      Weight:      SpO2:  100%      Intake/Output Summary (Last 24 hours) at 12/22/15 1632 Last data filed at 12/22/15 1411  Gross per 24 hour  Intake 922.08 ml  Output    675 ml  Net 247.08 ml   Filed Weights   12/20/15 0500 12/21/15 0435 12/22/15 0442  Weight: 96.163 kg (212 lb) 96.299 kg (212 lb 4.8 oz) 96.752 kg (213 lb 4.8 oz)   Exam:  GENERAL: NAD  HEENT: no scleral icterus, EOMI, no  nystagmus, PERRL  NECK: supple, no LAD  LUNGS: CTA biL, no wheezing  HEART: RRR without MRG  ABDOMEN: soft, non tender  EXTREMITIES: no clubbing / cyanosis  NEURO: non focal   Data Reviewed: Basic Metabolic Panel:  Recent Labs Lab 12/18/15 0344 12/19/15 0432 12/20/15 0318 12/21/15 0423 12/22/15 0451 12/22/15 1451  NA 138 138 137 137 138  --   K 3.2* 3.6 3.6 3.3* 3.8  --   CL 98* 104 104 106 106  --   CO2 21*  --   GLUCOSE 106* 91 103* 103* 137*  --   BUN 25* 26* 33* 30* 31*  --   CREATININE 2.07* 2.12* 2.17* 1.92* 1.98* 2.06*  CALCIUM 9.1 9.0 8.9 8.5* 9.1  --   MG  --  1.9  --   --   --   --    Liver Function Tests:  Recent Labs Lab 12/16/15 0221 12/19/15 0432  AST 53* 23  ALT 34 19  ALKPHOS 61 54  BILITOT 2.1* 0.7  PROT 7.1 7.3  ALBUMIN 3.5 3.2*   CBC:  Recent Labs Lab 12/16/15 0221 12/19/15 0432 12/21/15 0423 12/22/15 1451  WBC 8.9 6.7 5.8 9.4  HGB 13.3 13.6 12.6* 12.5*  HCT 37.5* 40.8 38.4* 37.0*  MCV 88.9 90.9 91.6 90.2  PLT 277 297 276 305   Cardiac Enzymes:  Recent Labs Lab 12/15/15 1845  TROPONINI 0.22*   BNP (last 3 results)  Recent Labs  12/15/15 0400  BNP 683.3*   Recent Results (from the past 240 hour(s))  MRSA PCR Screening     Status: None   Collection Time: 12/15/15  7:15 AM  Result Value Ref Range Status   MRSA by PCR NEGATIVE NEGATIVE Final    Comment:        The GeneXpert MRSA Assay (FDA approved for NASAL specimens only), is one component of a comprehensive MRSA colonization surveillance program. It is not intended to diagnose MRSA infection nor to guide or monitor treatment for MRSA infections.    Scheduled Meds: . amLODipine  10 mg Oral Daily  . atorvastatin  80 mg Oral q1800  . carvedilol  12.5 mg Oral BID WC  . [START ON 12/23/2015] enoxaparin (LOVENOX) injection  40 mg Subcutaneous Q24H  . famotidine  20 mg Oral BID  . furosemide  40 mg Oral Daily  . hydrALAZINE  100 mg Oral 3 times  per day  . isosorbide mononitrate  120 mg Oral Daily  . lisinopril  40 mg Oral Daily  . LORazepam  0.5 mg Intravenous Once  . sodium chloride flush  3 mL Intravenous Q12H   Continuous Infusions: . sodium chloride 75 mL/hr at 12/22/15 1248    Pamella Pert, MD [facility] Pager [phone]. If  7 PM - 7 AM, please contact night-coverage at [email or web address], password [facility] 12/22/2015, 4:32 PM  LOS: 7 days

## 2015-12-22 NOTE — Interval H&P Note (Signed)
Cath Lab Visit (complete for each Cath Lab visit)  Clinical Evaluation Leading to the Procedure:   ACS: No.  Non-ACS:    Anginal Classification: CCS II  Anti-ischemic medical therapy: Maximal Therapy (2 or more classes of medications)  Non-Invasive Test Results: High-risk stress test findings: cardiac mortality >3%/year  Prior CABG: No previous CABG

## 2015-12-22 NOTE — Progress Notes (Signed)
Patient Name: [name] Date of Encounter: 12/22/2015  Principal Problem:   Hypertensive emergency Active Problems:   Elevated troponin   Pulmonary edema   CKD (chronic kidney disease), stage III   Hepatitis   Acute on chronic systolic heart failure (HCC)   SOB (shortness of breath)   Chest pain   NSTEMI (non-ST elevated myocardial infarction) (HCC)   Abnormal stress test   Pain in the chest   Cranial nerve VI palsy    Primary Cardiologist: Dr. [name] Patient Profile: 50 yo male w/ PMH of medical noncompliance, HTN, nonischemic cardiomyopathy (EF 20-25% by echo this admission), renal insufficiency, chronic systolic CHF, and HTN admitted on 12/15/2015 for acute on chronic CHF.  SUBJECTIVE: Denies any chest pain, dyspnea, or palpitations overnight. Wanting to leave the hospital this AM due to home stressors of his daughter being involved in a MVA. Explained the results of his stress test to him and the need for a cardiac catheterization. He is in agreement to stay for the procedure.  OBJECTIVE Filed Vitals:   12/21/15 0435 12/21/15 1244 12/21/15 2055 12/22/15 0442  BP: 169/97 134/66 140/74 163/89  Pulse: 83 80 79 78  Temp: 98.6 F (37 C) 97.7 F (36.5 C) 98.2 F (36.8 C) 97.8 F (36.6 C)  TempSrc: Oral Oral Oral Oral  Resp: 20 20 16 16   Height:      Weight: 212 lb 4.8 oz (96.299 kg)   213 lb 4.8 oz (96.752 kg)  SpO2: 100% 92% 100% 96%    Intake/Output Summary (Last 24 hours) at 12/22/15 1052 Last data filed at 12/22/15 0752  Gross per 24 hour  Intake 682.08 ml  Output    675 ml  Net   7.08 ml   Filed Weights   12/20/15 0500 12/21/15 0435 12/22/15 0442  Weight: 212 lb (96.163 kg) 212 lb 4.8 oz (96.299 kg) 213 lb 4.8 oz (96.752 kg)    PHYSICAL EXAM General: Well developed, well nourished, male in no acute distress. Head: Normocephalic, atraumatic.  Neck: Supple without bruits, JVD not elevated. Lungs:  Resp regular and unlabored, CTA without wheezing  or rales. Heart: RRR, S1, S2, no S3, S4, or murmur; no rub. Abdomen: Soft, non-tender, non-distended with normoactive bowel sounds. No hepatomegaly. No rebound/guarding. No obvious abdominal masses. Extremities: No clubbing, cyanosis, or edema. Distal pedal pulses are 2+ bilaterally. Neuro: Alert and oriented X 3. Moves all extremities spontaneously. Psych: Normal affect.   LABS: CBC: Recent Labs  12/21/15 0423  WBC 5.8  HGB 12.6*  HCT 38.4*  MCV 91.6  PLT 276   INR: Recent Labs  12/22/15 0950  INR 1.09   Basic Metabolic Panel: Recent Labs  12/21/15 0423 12/22/15 0451  NA 137 138  K 3.3* 3.8  CL 106 106  CO2 22 21*  GLUCOSE 103* 137*  BUN 30* 31*  CREATININE 1.92* 1.98*  CALCIUM 8.5* 9.1   BNP:  B NATRIURETIC PEPTIDE  Date/Time Value Ref Range Status  12/15/2015 04:00 AM 683.3* 0.0 - 100.0 pg/mL Final   PRO B NATRIURETIC PEPTIDE (BNP)  Date/Time Value Ref Range Status  09/08/2014 06:45 AM 1623.0* 0 - 125 pg/mL Final  01/24/2014 12:15 PM 2207.0* 0 - 125 pg/mL Final   D-dimer:No results for input(s): DDIMER in the last 72 hours. Hemoglobin A1C: Recent Labs  12/20/15 1230  HGBA1C 5.8*    TELE:  NSR. Tachycardiac at times into the 120's. No atopic events noted.  ECHO: 12/15/2015 Study Conclusions - Procedure narrative: Transthoracic echocardiography. Image quality was suboptimal. The study was technically difficult, as a result of poor sound wave transmission and body habitus. Intravenous contrast (Definity) was administered. - Left ventricle: The cavity size was normal. There was moderate concentric hypertrophy. Systolic function was severely reduced. The estimated ejection fraction was in the range of 20% to 25%. Severe diffuse hypokinesis. There was a reduced contribution of atrial contraction to ventricular filling, due to increased ventricular diastolic pressure or atrial contractile dysfunction. Doppler parameters are  consistent with a reversible restrictive pattern, indicative of decreased left ventricular diastolic compliance and/or increased left atrial pressure (grade 3 diastolic dysfunction). Doppler parameters are consistent with high ventricular filling pressure. - Aortic valve: Transvalvular velocity was within the normal range. There was no stenosis. There was no regurgitation. - Mitral valve: Transvalvular velocity was within the normal range. There was no evidence for stenosis. There was no regurgitation. - Left atrium: The atrium was severely dilated. - Right ventricle: The cavity size was normal. Wall thickness was normal. Systolic function was normal. - Right atrium: The atrium was moderately dilated. - Inferior vena cava: The vessel was normal in size. The respirophasic diameter changes were blunted (< 50%), consistent with elevated central venous pressure. - Pericardium, extracardiac: A trivial pericardial effusion was identified.  Nuclear stress test; 12/19/2015  Findings consistent with ischemia.  This is a high risk study.  The left ventricular ejection fraction is moderately decreased (30-44%).  Defect 1: There is a large defect of severe severity present in the basal inferior, basal inferolateral, basal anterolateral, mid inferior, mid inferolateral and mid anterolateral location.   Current Medications:  . amLODipine  10 mg Oral Daily  . atorvastatin  80 mg Oral q1800  . carvedilol  12.5 mg Oral BID WC  . famotidine  20 mg Oral BID  . furosemide  40 mg Oral Daily  . hydrALAZINE  100 mg Oral 3 times per day  . isosorbide mononitrate  120 mg Oral Daily  . lisinopril  40 mg Oral Daily  . LORazepam  0.5 mg Intravenous Once  . sodium chloride  3 mL Intravenous Q12H  . sodium chloride flush  3 mL Intravenous Q12H   . sodium chloride 1 mL/kg/hr (12/21/15 2315)    ASSESSMENT AND PLAN:  1. Hypertensive emergency - BP has remained elevated to 140/74 -  203/133 in the past 24 hours,. - Currently on Amlodipine  daily, Coreg 12.5mg  BID, Hydralazine  TID, Imdur  daily, and Lisinopril  daily. Imdur recently increased from  to  on 12/21/2015. Could consider further increasing his Coreg today with episodes of tachycardia noted on telemetry.  2. Hx of NICM  - likely due to uncontrolled HTN - as above.  3. Acute on chronic systolic congestive heart failure - Echo 12/15/15 showed EF of 20-25% with severe diffuse hypokiness, grade 3 DD. - Diuresed -4.9 L this admission. Does not appear volume overloaded on exam.  - continue Lasix  PO daily.  4. Unstable angina - Currently chest pain free. Flat troponin trend. Echo with severely reduced EF. High risk stress test with a large area of ischemia in the inferior and inferolateral walls.  - for Cardiac Catheterization with Dr. Katrinka Blazing today. Was initially held due to INR > 10, but was normal on repeat lab draw. Has been NPO since midnight.  5. CKD (chronic kidney disease), stage III - Baseine creatinine of 1.5-2.0. - stable at 1.98 on 12/22/2015.  6. Cocaine abuse -  UDS + for cocaine this admission.   7. Hypokalemia - Potassium 3.8 on 12/22/2015 - currently resolved.  Lorri Frederick , PA-C 10:52 AM 12/22/2015 Pager: [phone]    Patient seen and examined. Agree with assessment and plan.  Cardiac Cath findings from today: Widely patent proximal to distal LAD. Circumflex is widely patent with 70% stenosis in the mid vessel. A branch of the first obtuse marginal is totally occluded. There is 80% stenosis in the proximal portion of the second obtuse marginal. The right coronary contains eccentric 50-60% proximal narrowing. When the coronary anatomy is compared to the most recent angiogram from 1-1/2 years ago there is progression of stenosis in the second obtuse marginal. Severe elevation of left ventricular end-diastolic pressure to 40 mmHg. This in  conjunction with the echo EF of 25-30% suggests severe systolic heart failure and the need for more aggressive medical therapy including diuresis. Left ventricular dysfunction would appear to be out of proportion to the degree of coronary disease.  Will hydrate post cath; hold lisinoprril today; f/u renal  In am.  Will maximize nitrates with hydralzine with significant LV dysfunction. Tomorrow plan to titrate carvedilol. Plan initial medical therapy, but if recurrent symptoms consider PCI to OM of LCX.   Pt's body habitus is highly suggestive of OSA.  He states that in the past he had a sleep study which was + for OSA but he never instituted CPAP therapy due to cost. Should pursue CPAP initiation.  Lennette Bihari, MD, [facility] 12/22/2015 2:54 PM

## 2015-12-23 ENCOUNTER — Telehealth: Payer: Self-pay | Admitting: Physician Assistant

## 2015-12-23 DIAGNOSIS — G4733 Obstructive sleep apnea (adult) (pediatric): Secondary | ICD-10-CM | POA: Insufficient documentation

## 2015-12-23 LAB — BASIC METABOLIC PANEL
Anion gap: 8 (ref 5–15)
BUN: 36 mg/dL — AB (ref 6–20)
CALCIUM: 8.6 mg/dL — AB (ref 8.9–10.3)
CO2: 24 mmol/L (ref 22–32)
CREATININE: 1.95 mg/dL — AB (ref 0.61–1.24)
Chloride: 105 mmol/L (ref 101–111)
GFR calc Af Amer: 44 mL/min — ABNORMAL LOW (ref >60)
GFR, EST NON AFRICAN AMERICAN: 38 mL/min — AB (ref >60)
GLUCOSE: 115 mg/dL — AB (ref 65–99)
Potassium: 3.7 mmol/L (ref 3.5–5.1)
SODIUM: 137 mmol/L (ref 135–145)

## 2015-12-23 LAB — CBC
HCT: 34.6 % — ABNORMAL LOW (ref 39.0–52.0)
Hemoglobin: 11.8 g/dL — ABNORMAL LOW (ref 13.0–17.0)
MCH: 31.1 pg (ref 26.0–34.0)
MCHC: 34.1 g/dL (ref 30.0–36.0)
MCV: 91.3 fL (ref 78.0–100.0)
Platelets: 253 10*3/uL (ref 150–400)
RBC: 3.79 MIL/uL — ABNORMAL LOW (ref 4.22–5.81)
RDW: 14.3 % (ref 11.5–15.5)
WBC: 10.8 10*3/uL — ABNORMAL HIGH (ref 4.0–10.5)

## 2015-12-23 LAB — GLUCOSE, CAPILLARY: GLUCOSE-CAPILLARY: 112 mg/dL — AB (ref 65–99)

## 2015-12-23 MED ORDER — ASPIRIN EC 81 MG PO TBEC: 81.0000 mg | DELAYED_RELEASE_TABLET | Freq: Every day | ORAL | Status: AC

## 2015-12-23 MED ORDER — HYDRALAZINE HCL 100 MG PO TABS: 100.0000 mg | ORAL_TABLET | Freq: Three times a day (TID) | ORAL | Status: DC

## 2015-12-23 MED ORDER — NITROGLYCERIN 0.4 MG SL SUBL: 0.4000 mg | SUBLINGUAL_TABLET | SUBLINGUAL | Status: DC | PRN

## 2015-12-23 MED ORDER — CARVEDILOL 12.5 MG PO TABS
12.5000 mg | ORAL_TABLET | Freq: Two times a day (BID) | ORAL | Status: DC
Start: 2015-12-23 — End: 2016-04-08

## 2015-12-23 MED ORDER — POTASSIUM CHLORIDE CRYS ER 20 MEQ PO TBCR: 20.0000 meq | EXTENDED_RELEASE_TABLET | Freq: Two times a day (BID) | ORAL | Status: DC

## 2015-12-23 MED ORDER — NICOTINE 21 MG/24HR TD PT24: 21.0000 mg | MEDICATED_PATCH | Freq: Every day | TRANSDERMAL | Status: DC

## 2015-12-23 MED ORDER — LISINOPRIL 40 MG PO TABS: 40.0000 mg | ORAL_TABLET | Freq: Every day | ORAL | Status: DC

## 2015-12-23 MED ORDER — AMLODIPINE BESYLATE 10 MG PO TABS: 10.0000 mg | ORAL_TABLET | Freq: Every day | ORAL | Status: DC

## 2015-12-23 MED ORDER — FUROSEMIDE 40 MG PO TABS: 40.0000 mg | ORAL_TABLET | Freq: Every day | ORAL | Status: DC

## 2015-12-23 MED ORDER — ISOSORBIDE MONONITRATE ER 120 MG PO TB24: 120.0000 mg | ORAL_TABLET | Freq: Every day | ORAL | Status: DC

## 2015-12-23 MED ORDER — ATORVASTATIN CALCIUM 80 MG PO TABS: 80.0000 mg | ORAL_TABLET | Freq: Every day | ORAL | Status: DC

## 2015-12-23 NOTE — Telephone Encounter (Signed)
Error  [employer]

## 2015-12-23 NOTE — Progress Notes (Signed)
Heart Failure Navigator Consult Note  Presentation: [name] is a 50 y.o. male with a past medical history significant for NICM with EF 30%, HTN, and CKD without regular medical follow up who presents with dyspnea.  The patient reports to me that he has had about 4 months of increasing dyspnea on exertion. This is also is accompanied by leg pain and a sharp chest pain that goes away with rest. 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 Now, in the last several weeks he has had worsening orthopnea, paroxysmal nocturnal dyspnea (waking up at night with dyspnea the last for about 10 minutes and goes away when he sits up and uses his wife's inhaler), ankle and wrist swelling, and then tonight he woke up with the dyspnea and it just wouldn't go away so he came to the ER.  In the ED, the patient was initially hypertensive to 265/156 mmHg. Hip pulmonary edema on chest x-ray, evidence of myocardial necrosis, elevated BNP, and was restless. In the ER, there was initially concern that he may have to be intubated because of restlessness, but his breathing improved with BiPAP, nitroglycerin gtt was started and his blood pressure was brought down to 200/125. The case was discussed with PCCM, and TRH were asked to evaluate for admission.  The patient currently does not have a PCP, he does not take any of his medicines. He does not smoke. He denies cocaine use.  Past Medical History  Diagnosis Date  . CAD (coronary artery disease)     Small vessel with occluded small OM and nonobstructive large vessel disease (cath 02/2014)  . Obesity   . History of syncope 08/2008    Secondary to hypertension  . Tobacco abuse   . Hypertension, essential, benign     a. renal art [place] (6/14): no RAS  . Noncompliance   . CKD (chronic kidney disease)   . Nonischemic cardiomyopathy (HCC)     EF 45%  . Sleep apnea     Social History   Social History  . Marital Status: Single    Spouse Name:  N/A  . Number of Children: 5  . Years of Education: N/A   Occupational History  . [employer]   .     Social History Main Topics  . Smoking status: Current Some Day Smoker -- 0.10 packs/day for 32 years    Types: Cigarettes  . Smokeless tobacco: None     Comment: Started smoking at age 1 (33).  Cutting back  . Alcohol Use: No  . Drug Use: No  . Sexual Activity: Not Currently   Other Topics Concern  . None   Social History Narrative   Lives with girlfriend.    ECHO:Study Conclusions--12/15/15  - Procedure narrative: Transthoracic echocardiography. Image quality was suboptimal. The study was technically difficult, as a result of poor sound wave transmission and body habitus. Intravenous contrast (Definity) was administered. - Left ventricle: The cavity size was normal. There was moderate concentric hypertrophy. Systolic function was severely reduced. The estimated ejection fraction was in the range of 20% to 25%. Severe diffuse hypokinesis. There was a reduced contribution of atrial contraction to ventricular filling, due to increased ventricular diastolic pressure or atrial contractile dysfunction. Doppler parameters are consistent with a reversible restrictive pattern, indicative of decreased left ventricular diastolic compliance and/or increased left atrial pressure (grade 3 diastolic dysfunction). Doppler parameters are consistent with high ventricular filling pressure. - Aortic valve: Transvalvular  velocity was within the normal range. There was no stenosis. There was no regurgitation. - Mitral valve: Transvalvular velocity was within the normal range. There was no evidence for stenosis. There was no regurgitation. - Left atrium: The atrium was severely dilated. - Right ventricle: The cavity size was normal. Wall thickness was normal. Systolic function was normal. - Right atrium: The atrium was moderately dilated. - Inferior  vena cava: The vessel was normal in size. The respirophasic diameter changes were blunted (< 50%), consistent with elevated central venous pressure. - Pericardium, extracardiac: A trivial pericardial effusion was identified.  Transthoracic echocardiography. M-mode, complete 2D, spectral Doppler, and color Doppler. Birthdate: Patient birthdate: 30-Dec-1965. Age: Patient is 50 yr old. Sex: Gender: male. BMI: 33.7 kg/m^2. Blood pressure:   232/145 Patient status: Inpatient. Study date: Study date: 12/15/2015. Study time: 07:53 AM. Location: ICU/CCU  BNP    Component Value Date/Time   BNP 683.3* 12/15/2015 0400    ProBNP    Component Value Date/Time   PROBNP 1623.0* 09/08/2014 0645     Education Assessment and Provision:  Detailed education and instructions provided on heart failure disease management including the following:  Signs and symptoms of Heart Failure When to call the physician Importance of daily weights Low sodium diet Fluid restriction Medication management Anticipated future follow-up appointments  Patient education given on each of the above topics.  Patient acknowledges understanding and acceptance of all instructions.  I spoke briefly with patient regarding his HF as he was quite anxious to leave.   He lives in [place] and will return there with his girlfriend.  He has an aunt and cousin who are present in the room for education and can provide additional support as needed.  He did not have a scale and I have provided one for his home use.  I reviewed the importance of daily weights and how weight gains are related to signs and symptoms of HF.  I also briefly discussed a low sodium diet and high sodium foods to avoid.  He denies any issues with getting or taking prescribed medications.  He will follow with [facility] on 01/06/16 at 0930 am.  Education Materials:  "Living Better With Heart Failure" Booklet, Daily Weight Tracker  Tool   High Risk Criteria for Readmission and/or Poor Patient Outcomes:   EF <30%- 20-25 % EF  2 or more admissions in 6 months- No  Difficult social situation- No   Demonstrates medication noncompliance- denies    Barriers of Care:  Knowledge and compliance  Discharge Planning:   Plans to return home in [place] with girlfriend.

## 2015-12-23 NOTE — Progress Notes (Signed)
Patient Name: [name] Date of Encounter: 12/23/2015   SUBJECTIVE  Feeling well. No chest pain, sob or palpitations.   CURRENT MEDS . amLODipine  10 mg Oral Daily  . atorvastatin  80 mg Oral q1800  . carvedilol  12.5 mg Oral BID WC  . enoxaparin (LOVENOX) injection  40 mg Subcutaneous Q24H  . famotidine  20 mg Oral BID  . furosemide  40 mg Oral Daily  . hydrALAZINE  100 mg Oral 3 times per day  . isosorbide mononitrate  120 mg Oral Daily  . lisinopril  40 mg Oral Daily  . LORazepam  0.5 mg Intravenous Once  . sodium chloride flush  3 mL Intravenous Q12H    OBJECTIVE  Filed Vitals:   12/22/15 2306 12/23/15 0053 12/23/15 0553 12/23/15 0948  BP:  161/83 158/102 151/81  Pulse:   73 77  Temp:  98 F (36.7 C) 98 F (36.7 C)   TempSrc:  Oral Oral   Resp:  18 18   Height: 6' (1.829 m)     Weight: 218 lb 9.6 oz (99.156 kg)  218 lb 9.6 oz (99.156 kg)   SpO2:  100% 100% 100%    Intake/Output Summary (Last 24 hours) at 12/23/15 1333 Last data filed at 12/23/15 0906  Gross per 24 hour  Intake 1517.5 ml  Output   1100 ml  Net  417.5 ml   Filed Weights   12/22/15 0442 12/22/15 2306 12/23/15 0553  Weight: 213 lb 4.8 oz (96.752 kg) 218 lb 9.6 oz (99.156 kg) 218 lb 9.6 oz (99.156 kg)    PHYSICAL EXAM  General: Pleasant, NAD. Neuro: Alert and oriented X 3. Moves all extremities spontaneously. Psych: Normal affect. HEENT:  Normal  Neck: Supple without bruits or JVD. Lungs:  Resp regular and unlabored, CTA. Heart: RRR no s3, s4, or murmurs. Abdomen: Soft, non-tender, non-distended, BS + x 4.  Extremities: No clubbing, cyanosis or edema. DP/PT/Radials 2+ and equal bilaterally. R femoral cath site without hematoma.   Accessory Clinical Findings  CBC  Recent Labs  12/22/15 1451 12/23/15 0543  WBC 9.4 10.8*  HGB 12.5* 11.8*  HCT 37.0* 34.6*  MCV 90.2 91.3  PLT 305 253   Basic Metabolic Panel  Recent Labs  12/22/15 0451 12/22/15 1451 12/23/15 0543  NA  138  --  137  K 3.8  --  3.7  CL 106  --  105  CO2 21*  --  24  GLUCOSE 137*  --  115*  BUN 31*  --  36*  CREATININE 1.98* 2.06* 1.95*  CALCIUM 9.1  --  8.6*   TELE  NSR  Radiology/Studies  Mr Brain Wo Contrast  12/19/2015  CLINICAL DATA:  Diplopia. EXAM: MRI HEAD WITHOUT CONTRAST TECHNIQUE: Multiplanar, multiecho pulse sequences of the brain and surrounding structures were obtained without intravenous contrast. COMPARISON:  CT head without contrast 03/17/2013. FINDINGS: The diffusion-weighted images demonstrate no evidence for acute or subacute infarction. No acute hemorrhage or mass lesion is present. Age advanced moderate periventricular white matter disease is present bilaterally. Mild generalized atrophy is somewhat advanced for age as well. The ventricles are proportionate to the degree of atrophy. No significant extra-axial fluid collection is present. The internal auditory canals are within normal limits. Flow is present in the major intracranial arteries. Globes and orbits are intact. Minimal mucosal thickening is present along the floor of the left maxillary sinus. The skullbase is unremarkable. Midline sagittal images are within normal limits. IMPRESSION:  1. No acute intracranial abnormality. 2. Age advanced moderate periventricular and subcortical white matter disease bilaterally. The finding is nonspecific but can be seen in the setting of chronic microvascular ischemia, a demyelinating process such as multiple sclerosis, vasculitis, complicated migraine headaches, or as the sequelae of a prior infectious or inflammatory process. 3. Mild generalized atrophy is also advanced for age. Electronically Signed   By: Marin Roberts M.D.   On: 12/19/2015 12:20   Nm Myocar Multi W/spect W/wall Motion / Ef  12/19/2015   Findings consistent with ischemia.  This is a high risk study.  The left ventricular ejection fraction is moderately decreased (30-44%).  Defect 1: There is a large  defect of severe severity present in the basal inferior, basal inferolateral, basal anterolateral, mid inferior, mid inferolateral and mid anterolateral location.    Dg Chest Port 1 View  12/15/2015  CLINICAL DATA:  Acute onset of respiratory distress. Initial encounter. EXAM: PORTABLE CHEST 1 VIEW COMPARISON:  Chest radiograph performed 09/08/2014 FINDINGS: The lungs are well-aerated. Vascular congestion is noted. Mildly increased interstitial markings may reflect minimal interstitial edema. There is no evidence of pleural effusion or pneumothorax. The cardiomediastinal silhouette is enlarged. No acute osseous abnormalities are seen. IMPRESSION: Vascular congestion and cardiomegaly. Mildly increased interstitial markings may reflect minimal interstitial edema. Electronically Signed   By: Roanna Raider M.D.   On: 12/15/2015 04:26   Cath  Conclusion    1. Ost 1st Mrg to 1st Mrg lesion, 90% stenosed. 2. Lat Ramus lesion, 100% stenosed. 3. Ost LAD to Mid LAD lesion, 50% stenosed. 4. Prox Cx to Mid Cx lesion, 70% stenosed. 5. Dist LAD lesion, 80% stenosed. 6. Prox RCA to Mid RCA lesion, 65% stenosed.   Widely patent proximal to distal LAD. Circumflex is widely patent with 70% stenosis in the mid vessel. A branch of the first obtuse marginal is totally occluded. There is 80% stenosis in the proximal portion of the second obtuse marginal. The right coronary contains eccentric 50-60% proximal narrowing.  When the coronary anatomy is compared to the most recent angiogram from 1-1/2 years ago there is progression of stenosis in the second obtuse marginal.  Severe elevation of left ventricular end-diastolic pressure to 40 mmHg. This in conjunction with the echo EF of 25-30% suggests severe systolic heart failure and the need for more aggressive medical therapy including diuresis. Left ventricular dysfunction would appear to be out of proportion to the degree of coronary  disease.   Recommendation:   With significant kidney impairment, PCI on the obtuse marginal could be considered at a later date. If angina is not present, I will continue to treat medically.  Aggressive medical therapy for systolic heart failure should include aggressive blood pressure control and diuresis.  80 cc of contrast used during the diagnostic procedure. Gentle IV hydration will be given over the next 24 hours. Follow-up kidney function in a.m.    ASSESSMENT AND PLAN Principal Problem:   Hypertensive emergency Active Problems:   Elevated troponin   Pulmonary edema   CKD (chronic kidney disease), stage III   Hepatitis   Acute on chronic systolic heart failure (HCC)   SOB (shortness of breath)   Chest pain   NSTEMI (non-ST elevated myocardial infarction) (HCC)   Abnormal stress test   Pain in the chest   Cranial nerve VI palsy    Plan: cath showed patent proximal to distal LAD, first obtuse totally occluded. 80% stenosis of 2nd obtuse marginal artery. Plan for PCI at  later time. Aggressive medical therapy for systolic HF. Continue ASA, statin, BB, hydralazine, imdur, lasix and lisinopril. Will need BMET in one week. Scr improved overnight. MD to see.  - Care manager to help set up CPAP.   Signed, Manson Passey PA-C    Patient seen and examined. Agree with assessment and plan. Feels well; renal function slightly improved today.  Continue further med titration as outpatient with carvedilol.  Will most likely need to be scheduled for a new sleep study (split night at [facility] sleep lab) prior to CPAP initiation.  DC today.   Lennette Bihari, MD, [facility] 12/23/2015 2:19 PM  Pager [phone]

## 2015-12-23 NOTE — Discharge Instructions (Signed)
Follow with Cardiology 2-4 weeks  Please get a complete blood count and chemistry panel checked by your Primary MD at your next visit, and again as instructed by your Primary MD. Please get your medications reviewed and adjusted by your Primary MD.  Please request your Primary MD to go over all Hospital Tests and Procedure/Radiological results at the follow up, please get all Hospital records sent to your Prim MD by signing hospital release before you go home.  If you had Pneumonia of Lung problems at the Hospital: Please get a 2 view Chest X ray done in 6-8 weeks after hospital discharge or sooner if instructed by your Primary MD.  If you have Congestive Heart Failure: Please call your Cardiologist or Primary MD anytime you have any of the following symptoms:  1) 3 pound weight gain in 24 hours or 5 pounds in 1 week  2) shortness of breath, with or without a dry hacking cough  3) swelling in the hands, feet or stomach  4) if you have to sleep on extra pillows at night in order to breathe  Follow cardiac low salt diet and 1.5 lit/day fluid restriction.  If you have diabetes Accuchecks 4 times/day, Once in AM empty stomach and then before each meal. Log in all results and show them to your primary doctor at your next visit. If any glucose reading is under 80 or above 300 call your primary MD immediately.  If you have Seizure/Convulsions/Epilepsy: Please do not drive, operate heavy machinery, participate in activities at heights or participate in high speed sports until you have seen by Primary MD or a Neurologist and advised to do so again.  If you had Gastrointestinal Bleeding: Please ask your Primary MD to check a complete blood count within one week of discharge or at your next visit. Your endoscopic/colonoscopic biopsies that are pending at the time of discharge, will also need to followed by your Primary MD.  Get Medicines reviewed and adjusted. Please take all your medications with  you for your next visit with your Primary MD  Please request your Primary MD to go over all hospital tests and procedure/radiological results at the follow up, please ask your Primary MD to get all Hospital records sent to his/her office.  If you experience worsening of your admission symptoms, develop shortness of breath, life threatening emergency, suicidal or homicidal thoughts you must seek medical attention immediately by calling 911 or calling your MD immediately  if symptoms less severe.  You must read complete instructions/literature along with all the possible adverse reactions/side effects for all the Medicines you take and that have been prescribed to you. Take any new Medicines after you have completely understood and accpet all the possible adverse reactions/side effects.   Do not drive or operate heavy machinery when taking Pain medications.   Do not take more than prescribed Pain, Sleep and Anxiety Medications  Special Instructions: If you have smoked or chewed Tobacco  in the last 2 yrs please stop smoking, stop any regular Alcohol  and or any Recreational drug use.  Wear Seat belts while driving.  Please note You were cared for by a hospitalist during your hospital stay. If you have any questions about your discharge medications or the care you received while you were in the hospital after you are discharged, you can call the unit and asked to speak with the hospitalist on call if the hospitalist that took care of you is not available. Once you are discharged,  your primary care physician will handle any further medical issues. Please note that NO REFILLS for any discharge medications will be authorized once you are discharged, as it is imperative that you return to your primary care physician (or establish a relationship with a primary care physician if you do not have one) for your aftercare needs so that they can reassess your need for medications and monitor your lab  values.  You can reach the hospitalist office at phone [phone] or fax [phone]   If you do not have a primary care physician, you can call [phone] for a physician referral.  Activity: As tolerated with Full fall precautions use walker/cane & assistance as needed  Diet: heart healthy  Disposition Home

## 2015-12-23 NOTE — Discharge Summary (Signed)
Physician Discharge Summary  [name] WCH:[id number] DOB: 19-Dec-1965 DOA: 12/15/2015  PCP: No PCP Per Patient  Admit date: 12/15/2015 Discharge date: 12/23/2015  Time spent: > 30 minutes  Recommendations for Outpatient Follow-up:  1. Follow up with cardiology in 2 weeks as scheduled  Discharge Diagnoses:  Principal Problem:   Hypertensive emergency Active Problems:   Elevated troponin   Pulmonary edema   CKD (chronic kidney disease), stage III   Hepatitis   Acute on chronic systolic heart failure (HCC)   SOB (shortness of breath)   Chest pain   NSTEMI (non-ST elevated myocardial infarction) (HCC)   Abnormal stress test   Pain in the chest   Cranial nerve VI palsy   OSA (obstructive sleep apnea)  Discharge Condition: stable  Diet recommendation: heart healthy  Filed Weights   12/22/15 0442 12/22/15 2306 12/23/15 0553  Weight: 96.752 kg (213 lb 4.8 oz) 99.156 kg (218 lb 9.6 oz) 99.156 kg (218 lb 9.6 oz)   History of present illness:  See H&P, Labs, Consult and Test reports for all details in brief, patient is a 50 y.o. male with a past medical history significant for NICM with EF 30%, HTN, and CKD without regular medical follow up who presents with dyspnea. The patient reports to me that he has had about 4 months of increasing dyspnea on exertion. This is also is accompanied by leg pain and a sharp chest pain that goes away with rest. 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 Now, in the last several weeks he has had worsening orthopnea, paroxysmal nocturnal dyspnea (waking up at night with dyspnea the last for about 10 minutes and goes away when he sits up and uses his wife's inhaler), ankle and wrist swelling, and then tonight he woke up with the dyspnea and it just wouldn't go away so he came to the ER.  Hospital Course:  Hypertensive emergency - patient was admitted to stepdown and place initially on Nitroglycerin gtt with improvement in his  blood pressure. He was transitioned to and has been maintained on Norvasc 10, Coreg 12.5 BID, Lasix 40 daily, hydralazine 100 TID, Imdur 120 daily, Lisinopril 40 daily with improvement in his blood pressure. He will likely still need up titration as an outpatient. I'm concerned however about the patient's compliance given history of noncompliance. 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 MRI was negative for acute findings.  Acute on chronic systolic CHF - 2-D echo showed LVEF of 20-25%. Cardiology was consulted and have followed patient while hospitalized. He underwent a stress test which was deemed as a high risk and patient underwent cardiac catheterization, which is as below.  1. Ost 1st Mrg to 1st Mrg lesion, 90% stenosed. 2. Lat Ramus lesion, 100% stenosed. 3. Ost LAD to Mid LAD lesion, 50% stenosed. 4. Prox Cx to Mid Cx lesion, 70% stenosed. 5. Dist LAD lesion, 80% stenosed. 6. Prox RCA to Mid RCA lesion, 65% stenosed.  At this point no PCI was indicated, he is to be optimized on medical management, and re-intervention with stenting is to be considered if his symptoms recur. He has follow-up with cardiology as an outpatient in 2 weeks. Acute hypoxic respiratory failure due to Pulmonary edema in the setting of Acute on chronic systolic CHF - Was briefly on BiPAP, tapered off currently on  room air, this is resolved probably with resolution of pulmonary edema.  CKD stage III - Stable, baseline appears to be ~1.6-2.0 in the last few years. Patient seems to be tolerating ACE inhibitor and Lasix, he tolerated cardiac catheterization without major fluctuation in his renal function, his creatinine is probably at baseline. Continue to monitor as an outpatient. Transaminitis - This is likely congestive hepatopathy secondary to the acute/chronic systolic CHF.  improved   Procedures:  Stress test  Cardiac catheterization  2-D echo   Consultations:  Cardiology   Discharge Exam: Filed Vitals:   12/22/15 2306 12/23/15 0053 12/23/15 0553 12/23/15 0948  BP:  161/83 158/102 151/81  Pulse:   73 77  Temp:  98 F (36.7 C) 98 F (36.7 C)   TempSrc:  Oral Oral   Resp:  18 18   Height: 6' (1.829 m)     Weight: 99.156 kg (218 lb 9.6 oz)  99.156 kg (218 lb 9.6 oz)   SpO2:  100% 100% 100%    General: no acute distress Cardiovascular: Regular rate and rhythm Respiratory: Clear to auscultation bilaterally   Discharge Instructions Activity:  As tolerated   Get Medicines reviewed and adjusted: Please take all your medications with you for your next visit with your Primary MD  Please request your Primary MD to go over all hospital tests and procedure/radiological results at the follow up, please ask your Primary MD to get all Hospital records sent to his/her office.  If you experience worsening of your admission symptoms, develop shortness of breath, life threatening emergency, suicidal or homicidal thoughts you must seek medical attention immediately by calling 911 or calling your MD immediately if symptoms less severe.  You must read complete instructions/literature along with all the possible adverse reactions/side effects for all the Medicines you take and that have been prescribed to you. Take any new Medicines after you have completely understood and accpet all the possible adverse reactions/side effects.   Do not drive when taking Pain medications.   Do not take more than prescribed Pain, Sleep and Anxiety Medications  Special Instructions: If you have smoked or chewed Tobacco in the last 2 yrs please stop smoking, stop any regular Alcohol and or any Recreational drug use.  Wear Seat belts while driving.  Please note  You were cared for by a hospitalist during your hospital stay. Once you are discharged, your primary care  physician will handle any further medical issues. Please note that NO REFILLS for any discharge medications will be authorized once you are discharged, as it is imperative that you return to your primary care physician (or establish a relationship with a primary care physician if you do not have one) for your aftercare needs so that they can reassess your need for medications and monitor your lab values.    Medication List    STOP taking these medications        benazepril 40 MG tablet  Commonly known as:  LOTENSIN      TAKE these medications        amLODipine 10 MG tablet  Commonly known as:  NORVASC  Take 1 tablet (10 mg total) by mouth daily.     aspirin EC 81 MG tablet  Take 1 tablet (81 mg total) by mouth daily.     atorvastatin 80 MG tablet  Commonly known as:  LIPITOR  Take 1 tablet (80 mg total) by mouth daily at 6 PM.     carvedilol 12.5 MG tablet  Commonly known as:  COREG  Take 1 tablet (12.5 mg total) by mouth 2 (two) times daily with a meal.     furosemide 40 MG tablet  Commonly known as:  LASIX  Take 1 tablet (40 mg total) by mouth daily.     hydrALAZINE 100 MG tablet  Commonly known as:  APRESOLINE  Take 1 tablet (100 mg total) by mouth every 8 (eight) hours.     isosorbide mononitrate 120 MG 24 hr tablet  Commonly known as:  IMDUR  Take 1 tablet (120 mg total) by mouth daily.     lisinopril 40 MG tablet  Commonly known as:  PRINIVIL,ZESTRIL  Take 1 tablet (40 mg total) by mouth daily.     multivitamin with minerals Tabs tablet  Take 1 tablet by mouth daily.     nicotine 21 mg/24hr patch  Commonly known as:  NICODERM CQ - dosed in mg/24 hours  Place 1 patch (21 mg total) onto the skin daily.     nitroGLYCERIN 0.4 MG SL tablet  Commonly known as:  NITROSTAT  Place 1 tablet (0.4 mg total) under the tongue every 5 (five) minutes as needed. For chest pain     potassium chloride SA 20 MEQ tablet  Commonly known as:  K-DUR,KLOR-CON  Take 1 tablet (20  mEq total) by mouth 2 (two) times daily.           Follow-up Information    Follow up with [facility] On 12/19/2015.   Why:  they see adult medicine patients, appt at 8:45am, [phone]   Contact information:   [address] [place] [address]       Follow up with [facility]. Go on 01/06/2016.   Specialty:  Cardiology   Why:  @9 :30 for post hospital with Darrol Jump, PA-C   Contact information:   [address] [place] [address] [phone]      The results of significant diagnostics from this hospitalization (including imaging, microbiology, ancillary and laboratory) are listed below for reference.    Significant Diagnostic Studies: Mr Brain Wo Contrast  12/19/2015  CLINICAL DATA:  Diplopia. EXAM: MRI HEAD WITHOUT CONTRAST TECHNIQUE: Multiplanar, multiecho pulse sequences of the brain and surrounding structures were obtained without intravenous contrast. COMPARISON:  CT head without contrast 03/17/2013. FINDINGS: The diffusion-weighted images demonstrate no evidence for acute or subacute infarction. No acute hemorrhage or mass lesion is present. Age advanced moderate periventricular white matter disease is present bilaterally. Mild generalized atrophy is somewhat advanced for age as well. The ventricles are proportionate to the degree of atrophy. No significant extra-axial fluid collection is present. The internal auditory canals are within normal limits. Flow is present in the major intracranial arteries. Globes and orbits are intact. Minimal mucosal thickening is present along the floor of the left maxillary sinus. The skullbase is unremarkable. Midline sagittal images are within normal limits. IMPRESSION: 1. No acute intracranial abnormality. 2. Age advanced moderate periventricular and subcortical white matter disease bilaterally. The finding is nonspecific but can be seen in the setting of chronic  microvascular ischemia, a demyelinating process such as multiple sclerosis, vasculitis, complicated migraine headaches, or as the sequelae of a prior infectious or inflammatory process. 3. Mild generalized atrophy is also advanced for age. Electronically Signed   By: Marin Roberts M.D.   On: 12/19/2015 12:20   Nm Myocar Multi W/spect W/wall Motion / Ef  12/19/2015   Findings consistent with  ischemia.  This is a high risk study.  The left ventricular ejection fraction is moderately decreased (30-44%).  Defect 1: There is a large defect of severe severity present in the basal inferior, basal inferolateral, basal anterolateral, mid inferior, mid inferolateral and mid anterolateral location.    Dg Chest Port 1 View  12/15/2015  CLINICAL DATA:  Acute onset of respiratory distress. Initial encounter. EXAM: PORTABLE CHEST 1 VIEW COMPARISON:  Chest radiograph performed 09/08/2014 FINDINGS: The lungs are well-aerated. Vascular congestion is noted. Mildly increased interstitial markings may reflect minimal interstitial edema. There is no evidence of pleural effusion or pneumothorax. The cardiomediastinal silhouette is enlarged. No acute osseous abnormalities are seen. IMPRESSION: Vascular congestion and cardiomegaly. Mildly increased interstitial markings may reflect minimal interstitial edema. Electronically Signed   By: Roanna Raider M.D.   On: 12/15/2015 04:26    Microbiology: Recent Results (from the past 240 hour(s))  MRSA PCR Screening     Status: None   Collection Time: 12/15/15  7:15 AM  Result Value Ref Range Status   MRSA by PCR NEGATIVE NEGATIVE Final    Comment:        The GeneXpert MRSA Assay (FDA approved for NASAL specimens only), is one component of a comprehensive MRSA colonization surveillance program. It is not intended to diagnose MRSA infection nor to guide or monitor treatment for MRSA infections.      Labs: Basic Metabolic Panel:  Recent Labs Lab  12/17/15 0450 12/18/15 0344 12/19/15 0432 12/20/15 0318 12/21/15 0423 12/22/15 0451 12/22/15 1451 12/23/15 0543  NA 139 138 138 137 137 138  --  137  K 2.8* 3.2* 3.6 3.6 3.3* 3.8  --  3.7  CL 92* 98* 104 104 106 106  --  105  CO2 27 27 24 23 22  21*  --  24  GLUCOSE 110* 106* 91 103* 103* 137*  --  115*  BUN 21* 25* 26* 33* 30* 31*  --  36*  CREATININE 2.11* 2.07* 2.12* 2.17* 1.92* 1.98* 2.06* 1.95*  CALCIUM 8.6* 9.1 9.0 8.9 8.5* 9.1  --  8.6*  MG  --   --  1.9  --   --   --   --   --    Liver Function Tests:  Recent Labs Lab 12/19/15 0432  AST 23  ALT 19  ALKPHOS 54  BILITOT 0.7  PROT 7.3  ALBUMIN 3.2*   CBC:  Recent Labs Lab 12/19/15 0432 12/21/15 0423 12/22/15 1451 12/23/15 0543  WBC 6.7 5.8 9.4 10.8*  HGB 13.6 12.6* 12.5* 11.8*  HCT 40.8 38.4* 37.0* 34.6*  MCV 90.9 91.6 90.2 91.3  PLT 297 276 305 253   BNP: BNP (last 3 results)  Recent Labs  12/15/15 0400  BNP 683.3*   CBG:  Recent Labs Lab 12/22/15 0623 12/22/15 1235 12/22/15 1625 12/22/15 2140 12/23/15 0549  GLUCAP 139* 137* 117* 140* 112*    Signed:  Maurissa Ambrose  [facility] 12/23/2015, 3:11 PM

## 2015-12-23 NOTE — Progress Notes (Signed)
Pt is waiting for the cardiologist at this point, according to him, "he will leave anyway 1pm if nobody shows up", Pt already signed the AMA paper, consequences of leaving AMA has been described to the patient, Medicines prescriptions has been given to patient provided by MD, will continue to monitor.

## 2015-12-23 NOTE — Progress Notes (Signed)
Talked to patient about CPAP machine, patient does not have insurance and has applied for Medicaid. Jermaine with [facility] in to talk to patient about the cost/ financial agreement. Patient wants to wait and discuss it later with his PCP in 2 weeks about the need for having a CPAP. Abelino Derrick [facility] [phone]

## 2015-12-23 NOTE — Discharge Summary (Signed)
Pt got discharged to home, discharge instructions provided and patient showed understanding to it, IV taken out,Telemonitor DC,pt left unit ambulatory with all of the belongings accompanied with a family members, weight scale provided.

## 2015-12-26 ENCOUNTER — Telehealth (HOSPITAL_COMMUNITY): Payer: Self-pay | Admitting: Surgery

## 2015-12-26 NOTE — Telephone Encounter (Signed)
Heart Failure Nurse Navigator Post Discharge Telephone Call  I called to check on [name] after his recent hospitalization.  He tells me that he has been feeling "much better".  He says that weight today was 205 lbs versus weight on day of D/C 1/31 being 218.6 lbs?  I encouraged him to avoid high sodium foods and briefly explained a low sodium diet.   He denies any issues getting or taking prescribed medications.  He can verify that follow-up is on 01/06/16 0930 am at [facility] and he has plans to be there for that appt.  I encouraged him to call me back with any questions or concerns regarding his HF.

## 2016-01-06 ENCOUNTER — Ambulatory Visit (INDEPENDENT_AMBULATORY_CARE_PROVIDER_SITE_OTHER): Payer: Self-pay | Admitting: Physician Assistant

## 2016-01-06 ENCOUNTER — Encounter: Payer: Self-pay | Admitting: Physician Assistant

## 2016-01-06 VITALS — BP 110/84 | HR 72 | Ht 72.0 in | Wt 210.0 lb

## 2016-01-06 DIAGNOSIS — I5022 Chronic systolic (congestive) heart failure: Secondary | ICD-10-CM

## 2016-01-06 DIAGNOSIS — I10 Essential (primary) hypertension: Secondary | ICD-10-CM

## 2016-01-06 DIAGNOSIS — Z5181 Encounter for therapeutic drug level monitoring: Secondary | ICD-10-CM

## 2016-01-06 NOTE — Progress Notes (Signed)
Cardiology Office Note   Date:  01/06/2016   ID:  [name], DOB 1966/05/27, MRN [id number]  PCP:  No PCP Per Patient  Cardiologist:  Dr Rosemarie Beath, PA-C   Chief Complaint  Patient presents with  . Follow-up    occ chest pain, some shortness of breath, no swelliing, no cramping, occ lightheadedness    History of Present Illness: [name] is a 50 y.o. male with a history of NICM with EF 30%, HTN, and CKD III.  D/c 01/31 after admit for CHF  [name] presents for  Post hospital follow-up.   since discharge from the hospital, [name] has done very well. He is trying to eat all when necessary follow low sodium diet. He has gotten information on this and does not need more. On his home scales, his weight is trending down and he has lost 4 pounds since discharge. He denies lower extremity edema, orthopnea or PND. His dyspnea on exertion is still there, but is improved from prior to admission.   he states he is compliant with his medications and his blood pressure is well controlled today. He is not having any significant side effects. He is not smoking. He feels he is doing very well.   Past Medical History  Diagnosis Date  . CAD (coronary artery disease)     Small vessel with occluded small OM and nonobstructive large vessel disease (cath 02/2014)  . Obesity   . History of syncope 08/2008    Secondary to hypertension  . Tobacco abuse   . Hypertension, essential, benign     a. renal art [place] (6/14): no RAS  . Noncompliance   . CKD (chronic kidney disease)   . Nonischemic cardiomyopathy (HCC)     EF 45%  . Sleep apnea     Past Surgical History  Procedure Laterality Date  . None    . Left heart catheterization with coronary angiogram N/A 02/21/2014    Procedure: LEFT HEART CATHETERIZATION WITH CORONARY ANGIOGRAM;  Surgeon: Kathleene Hazel, MD;  Location: [facility] CATH LAB;  Service: Cardiovascular;  Laterality: N/A;  . Cardiac  catheterization N/A 12/22/2015    Procedure: Left Heart Cath and Coronary Angiography;  Surgeon: Lyn Records, MD;  Location: [facility] INVASIVE CV LAB;  Service: Cardiovascular;  Laterality: N/A;    Current Outpatient Prescriptions  Medication Sig Dispense Refill  . amLODipine (NORVASC) 10 MG tablet Take 1 tablet (10 mg total) by mouth daily. 30 tablet 1  . aspirin EC 81 MG tablet Take 1 tablet (81 mg total) by mouth daily. 90 tablet 3  . atorvastatin (LIPITOR) 80 MG tablet Take 1 tablet (80 mg total) by mouth daily at 6 PM. 30 tablet 1  . carvedilol (COREG) 12.5 MG tablet Take 1 tablet (12.5 mg total) by mouth 2 (two) times daily with a meal. 60 tablet 1  . furosemide (LASIX) 40 MG tablet Take 1 tablet (40 mg total) by mouth daily. 30 tablet 1  . hydrALAZINE (APRESOLINE) 100 MG tablet Take 1 tablet (100 mg total) by mouth every 8 (eight) hours. 90 tablet 1  . isosorbide mononitrate (IMDUR) 120 MG 24 hr tablet Take 1 tablet (120 mg total) by mouth daily. 30 tablet 1  . lisinopril (PRINIVIL,ZESTRIL) 40 MG tablet Take 1 tablet (40 mg total) by mouth daily. 30 tablet 1  . Multiple Vitamin (MULTIVITAMIN WITH MINERALS) TABS tablet Take 1 tablet by mouth daily.    . nicotine (  NICODERM CQ - DOSED IN MG/24 HOURS) 21 mg/24hr patch Place 1 patch (21 mg total) onto the skin daily. 28 patch 0  . nitroGLYCERIN (NITROSTAT) 0.4 MG SL tablet Place 1 tablet (0.4 mg total) under the tongue every 5 (five) minutes as needed. For chest pain 30 tablet 1  . potassium chloride SA (K-DUR,KLOR-CON) 20 MEQ tablet Take 1 tablet (20 mEq total) by mouth 2 (two) times daily. 30 tablet 1   No current facility-administered medications for this visit.    Allergies:   Penicillins and Shrimp    Social History:  The patient  reports that he has been smoking Cigarettes.  He has a 3.2 pack-year smoking history. He does not have any smokeless tobacco history on file. He reports that he does not drink alcohol or use illicit drugs.    Family History:  The patient's family history includes Heart disease in his maternal grandfather; Lupus in his mother. There is no history of Coronary artery disease.    ROS:  Please see the history of present illness. All other systems are reviewed and negative.    PHYSICAL EXAM: VS:  BP 110/84 mmHg  Pulse 72  Ht 6' (1.829 m)  Wt 210 lb (95.255 kg)  BMI 28.47 kg/m2 , BMI Body mass index is 28.47 kg/(m^2). GEN: Well nourished, well developed, male in no acute distress HEENT: normal for age  Neck:  minimal JVD, no carotid bruit, no masses Cardiac: RRR;  soft murmur, no rubs, or gallops Respiratory:  clear to auscultation bilaterally, normal work of breathing GI: soft, nontender, nondistended, + BS MS: no deformity or atrophy; no edema; distal pulses are 2+ in all 4 extremities  Skin: warm and dry, no rash Neuro:  Strength and sensation are intact Psych: euthymic mood, full affect   EKG:  EKG is not ordered today.   Recent Labs: 12/15/2015: B Natriuretic Peptide 683.3* 12/19/2015: ALT 19; Magnesium 1.9 12/23/2015: BUN 36*; Creatinine, Ser 1.95*; Hemoglobin 11.8*; Platelets 253; Potassium 3.7; Sodium 137    Lipid Panel    Component Value Date/Time   CHOL 260* 01/25/2014 0232   TRIG 150* 01/25/2014 0232   HDL 41 01/25/2014 0232   CHOLHDL 6.3 01/25/2014 0232   VLDL 30 01/25/2014 0232   LDLCALC 189* 01/25/2014 0232   LDLDIRECT 165.5 08/14/2013 1050     Wt Readings from Last 3 Encounters:  01/06/16 210 lb (95.255 kg)  12/23/15 218 lb 9.6 oz (99.156 kg)  09/11/14 216 lb 14.9 oz (98.4 kg)     Other studies Reviewed: Additional studies/ records that were reviewed today include:  Previous hospitalization records, office records and testing.  ASSESSMENT AND PLAN:  1.   Chronic systolic CHF : he is encouraged to continue daily weights and a low sodium diet. He is encouraged to continue to increase his activity. We will check a BMET today to make sure his renal function and  electrolytes are tolerating the current medication regimen. No medication changes to it are needed if his BMET is stable.  Follow-up with M.D. In 3 months   2. Hypertension: his blood pressure is under good control today, he is encouraged to continue to be compliant with his medications.   Current medicines are reviewed at length with the patient today.  The patient does not have concerns regarding medicines.  The following changes have been made:  no change  Labs/ tests ordered today include:   Orders Placed This Encounter  Procedures  . Basic metabolic panel  Disposition:   FU with  Dr. [name]  Signed, Leanna Battles  01/06/2016 12:40 PM    [facility] [address] [place], [place], [place]  [address] Phone: [phone]; Fax: [phone]

## 2016-01-06 NOTE — Patient Instructions (Signed)
Continue daily weights and low-sodium diet.  Call [phone] if weight climbs more than 3 pounds in a day or 5 pounds in a week. No salt to very little salt in your diet.  No more than 2000 mg in a day. Call if increased shortness of breath or increased swelling.  Your physician recommends that you return for lab work in TODAY.  Rhonda Barrett, PA-C, recommends that you schedule a follow-up appointment in 3 months with Dr [name].  If you need a refill on your cardiac medications before your next appointment, please call your pharmacy.

## 2016-01-19 ENCOUNTER — Ambulatory Visit (INDEPENDENT_AMBULATORY_CARE_PROVIDER_SITE_OTHER): Payer: Self-pay | Admitting: Family Medicine

## 2016-01-19 ENCOUNTER — Encounter: Payer: Self-pay | Admitting: Family Medicine

## 2016-01-19 VITALS — BP 142/80 | HR 77 | Temp 98.1°F | Ht 67.0 in | Wt 206.0 lb

## 2016-01-19 DIAGNOSIS — Z7689 Persons encountering health services in other specified circumstances: Secondary | ICD-10-CM

## 2016-01-19 DIAGNOSIS — Z0000 Encounter for general adult medical examination without abnormal findings: Secondary | ICD-10-CM

## 2016-01-19 DIAGNOSIS — Z7189 Other specified counseling: Secondary | ICD-10-CM

## 2016-01-19 LAB — COMPLETE METABOLIC PANEL WITH GFR
ALBUMIN: 4.1 g/dL (ref 3.6–5.1)
ALT: 8 U/L — AB (ref 9–46)
AST: 12 U/L (ref 10–35)
Alkaline Phosphatase: 70 U/L (ref 40–115)
BUN: 17 mg/dL (ref 7–25)
CALCIUM: 9.5 mg/dL (ref 8.6–10.3)
CHLORIDE: 98 mmol/L (ref 98–110)
CO2: 28 mmol/L (ref 20–31)
CREATININE: 1.4 mg/dL — AB (ref 0.70–1.33)
GFR, Est African American: 67 mL/min (ref >=60)
GFR, Est Non African American: 58 mL/min — ABNORMAL LOW (ref >=60)
Glucose, Bld: 109 mg/dL — ABNORMAL HIGH (ref 65–99)
Potassium: 3.5 mmol/L (ref 3.5–5.3)
Sodium: 139 mmol/L (ref 135–146)
Total Bilirubin: 0.5 mg/dL (ref 0.2–1.2)
Total Protein: 7.6 g/dL (ref 6.1–8.1)

## 2016-01-19 LAB — LIPID PANEL
Cholesterol: 176 mg/dL (ref 125–200)
HDL: 33 mg/dL — AB (ref >=40)
LDL CALC: 108 mg/dL (ref <130)
Total CHOL/HDL Ratio: 5.3 Ratio — ABNORMAL HIGH (ref <=5.0)
Triglycerides: 174 mg/dL — ABNORMAL HIGH (ref <150)
VLDL: 35 mg/dL — ABNORMAL HIGH (ref <30)

## 2016-01-19 LAB — CBC WITH DIFFERENTIAL/PLATELET
Basophils Absolute: 0 10*3/uL (ref 0.0–0.1)
Basophils Relative: 0 % (ref 0–1)
EOS PCT: 2 % (ref 0–5)
Eosinophils Absolute: 0.1 10*3/uL (ref 0.0–0.7)
HEMATOCRIT: 41.3 % (ref 39.0–52.0)
Hemoglobin: 13.7 g/dL (ref 13.0–17.0)
LYMPHS ABS: 1.7 10*3/uL (ref 0.7–4.0)
LYMPHS PCT: 24 % (ref 12–46)
MCH: 29.7 pg (ref 26.0–34.0)
MCHC: 33.2 g/dL (ref 30.0–36.0)
MCV: 89.4 fL (ref 78.0–100.0)
MONO ABS: 0.8 10*3/uL (ref 0.1–1.0)
MPV: 9.6 fL (ref 8.6–12.4)
Monocytes Relative: 11 % (ref 3–12)
Neutro Abs: 4.4 10*3/uL (ref 1.7–7.7)
Neutrophils Relative %: 63 % (ref 43–77)
Platelets: 352 10*3/uL (ref 150–400)
RBC: 4.62 MIL/uL (ref 4.22–5.81)
RDW: 14 % (ref 11.5–15.5)
WBC: 7 10*3/uL (ref 4.0–10.5)

## 2016-01-19 LAB — HEPATITIS C ANTIBODY: HCV Ab: REACTIVE — AB

## 2016-01-19 LAB — HEPATITIS A ANTIBODY, TOTAL: Hep A Total Ab: REACTIVE — AB

## 2016-01-19 LAB — HEPATITIS B SURFACE ANTIBODY,QUALITATIVE: Hep B S Ab: POSITIVE — AB

## 2016-01-19 LAB — HEPATITIS B CORE ANTIBODY, TOTAL: HEP B C TOTAL AB: REACTIVE — AB

## 2016-01-19 NOTE — Patient Instructions (Signed)
Follow-up with cardiology as planned Check with your insurance and make sure they cover care with [place]. We will call if any of your labs need attention Once we know about your insurance we can look at ordering a sleep study and a screening for colon cancer.

## 2016-01-19 NOTE — Progress Notes (Signed)
Patient ID: [name], male   DOB: 09/20/1966, 50 y.o.   MRN: [id number]   Torrin Kalicki, is a 50 y.o. male  DIY:[id number]  MHW:[id number]  DOB - 08/16/1966  CC:  Chief Complaint  Patient presents with  . new patient/establish care    sees Dr [name] for CHF, recent hospitalization for cardiac issues, needs PCP, declines flu       HPI: [name] is a 50 y.o. male here to establish care. Patient has a cardiologist who manages his CAD, hypertension, CHF. He reports having a history of sleep apnea but does not use CPap. He reports having a sleep study about 3 years ago. He was recently in hospital with chest pain and dyspnea. He had a cath with showed blockages. He is currently being managed medically. He also has a history of CKD III. He has a history of non-compliance with medical treatment. He has had a follow-up with cardiology since discharge from hospital. He was referred here to establish primary care. He reports having insurance with [employer] but it is unclear if he has been assigned a PCP by [employer]. He has a history of tobacco abuse but reports she has stopped smoking. He declines a flu shot today. Will hold on ordering repeat sleep study and/or colonoscopy until we know about his insurance. His medications have been reviewed and are listed in his medication list.   Allergies  Allergen Reactions  . Penicillins Other (See Comments)    Patient states that it makes his throat itch really bad, and feels like it is closing up.   . Shrimp [Shellfish Allergy]     Throat swells   Past Medical History  Diagnosis Date  . CAD (coronary artery disease)     Small vessel with occluded small OM and nonobstructive large vessel disease (cath 02/2014)  . Obesity   . History of syncope 08/2008    Secondary to hypertension  . Tobacco abuse   . Hypertension, essential, benign     a. renal art [place] (6/14): no RAS  . Noncompliance   . CKD (chronic kidney disease)   . Nonischemic  cardiomyopathy (HCC)     EF 45%  . Sleep apnea   . CHF (congestive heart failure) (HCC)    Current Outpatient Prescriptions on File Prior to Visit  Medication Sig Dispense Refill  . amLODipine (NORVASC) 10 MG tablet Take 1 tablet (10 mg total) by mouth daily. 30 tablet 1  . aspirin EC 81 MG tablet Take 1 tablet (81 mg total) by mouth daily. 90 tablet 3  . atorvastatin (LIPITOR) 80 MG tablet Take 1 tablet (80 mg total) by mouth daily at 6 PM. 30 tablet 1  . carvedilol (COREG) 12.5 MG tablet Take 1 tablet (12.5 mg total) by mouth 2 (two) times daily with a meal. 60 tablet 1  . furosemide (LASIX) 40 MG tablet Take 1 tablet (40 mg total) by mouth daily. 30 tablet 1  . hydrALAZINE (APRESOLINE) 100 MG tablet Take 1 tablet (100 mg total) by mouth every 8 (eight) hours. 90 tablet 1  . isosorbide mononitrate (IMDUR) 120 MG 24 hr tablet Take 1 tablet (120 mg total) by mouth daily. 30 tablet 1  . lisinopril (PRINIVIL,ZESTRIL) 40 MG tablet Take 1 tablet (40 mg total) by mouth daily. 30 tablet 1  . Multiple Vitamin (MULTIVITAMIN WITH MINERALS) TABS tablet Take 1 tablet by mouth daily.    . nicotine (NICODERM CQ - DOSED IN MG/24 HOURS) 21 mg/24hr patch Place  1 patch (21 mg total) onto the skin daily. 28 patch 0  . nitroGLYCERIN (NITROSTAT) 0.4 MG SL tablet Place 1 tablet (0.4 mg total) under the tongue every 5 (five) minutes as needed. For chest pain 30 tablet 1  . potassium chloride SA (K-DUR,KLOR-CON) 20 MEQ tablet Take 1 tablet (20 mEq total) by mouth 2 (two) times daily. 30 tablet 1   No current facility-administered medications on file prior to visit.   Family History  Problem Relation Age of Onset  . Lupus Mother   . Heart disease Maternal Grandfather   . Coronary artery disease Neg Hx    Social History   Social History  . Marital Status: Single    Spouse Name: N/A  . Number of Children: 5  . Years of Education: N/A   Occupational History  . [employer]   .     Social History Main  Topics  . Smoking status: Former Smoker -- 0.10 packs/day for 32 years    Types: Cigarettes  . Smokeless tobacco: Not on file     Comment: Started smoking at age 9 (74).  Cutting back  . Alcohol Use: No  . Drug Use: No  . Sexual Activity: Not Currently   Other Topics Concern  . Not on file   Social History Narrative   Lives with girlfriend.    Review of Systems: Constitutional: Negative for fever, chills, appetite change, weight loss,  Fatigue. Skin: Negative for rashes or lesions of concern. HENT: Negative for ear pain, ear discharge.nose bleeds Eyes: Negative for pain, discharge, redness, itching. Positive for occassional blurred vision.  Neck: Negative for pain, stiffness Respiratory: Negative for cough, shortness of breath at this time Cardiovascular: Negative for chest pain, palpitations and leg swelling at this time Gastrointestinal: Negative for abdominal pain, nausea, vomiting, diarrhea, constipations Genitourinary: Negative for dysuria, urgency, frequency, hematuria,  Musculoskeletal: Positive  For chronic  back pain. Negative for other joint pain, joint  swelling, and gait problem.Negative for weakness. Neurological: Negative for dizziness, tremors, seizures, syncope,   light-headedness, numbness and headaches.  Hematological: Negative for easy bruising or bleeding Psychiatric/Behavioral: Negative for depression, anxiety, decreased concentration, confusion   Objective:   Filed Vitals:   01/19/16 0911  BP: 149/72  Pulse: 77  Temp: 98.1 F (36.7 C)    Physical Exam: Constitutional: Patient appears well-developed and well-nourished. No distress. HENT: Normocephalic, atraumatic, External right and left ear normal. Oropharynx is clear and moist.  Eyes: Conjunctivae and EOM are normal. PERRLA, no scleral icterus. Neck: Normal ROM. Neck supple. No lymphadenopathy, No thyromegaly. CVS: RRR, S1/S2 +, no murmurs, no gallops, no rubs Pulmonary: Effort and breath  sounds normal, no stridor, rhonchi, wheezes, rales.  Abdominal: Soft. Normoactive BS,, no distension, tenderness, rebound or guarding.  Musculoskeletal: Normal range of motion. No edema and no tenderness.  Neuro: Alert.Normal muscle tone coordination. Non-focal Skin: Skin is warm and dry. No rash noted. Not diaphoretic. No erythema. No pallor. Psychiatric: Normal mood and affect. Behavior, judgment, thought content normal.  Lab Results  Component Value Date   WBC 10.8* 12/23/2015   HGB 11.8* 12/23/2015   HCT 34.6* 12/23/2015   MCV 91.3 12/23/2015   PLT 253 12/23/2015   Lab Results  Component Value Date   CREATININE 1.95* 12/23/2015   BUN 36* 12/23/2015   NA 137 12/23/2015   K 3.7 12/23/2015   CL 105 12/23/2015   CO2 24 12/23/2015    Lab Results  Component Value Date   HGBA1C  5.8* 12/20/2015   Lipid Panel     Component Value Date/Time   CHOL 260* 01/25/2014 0232   TRIG 150* 01/25/2014 0232   HDL 41 01/25/2014 0232   CHOLHDL 6.3 01/25/2014 0232   VLDL 30 01/25/2014 0232   LDLCALC 189* 01/25/2014 0232       Assessment and plan:   1. Health care maintenance  - COMPLETE METABOLIC PANEL WITH GFR - CBC with Differential - Lipid panel - PSA - Hepatitis A antibody, total - Hepatitis B core antibody, total - Hepatitis B e antibody - Hepatitis B surface antibody - Hepatitis C antibody  2. Visit to establish care - I have reviewed information provided by the patient and pertinent information from his chart.  3. Reported Sleep Apnea -Will have him check on his insurance and switch to [place] if he is assigned elsewhere and then order sleep study.  4. Need for colon cancer screening -Will follow-up once we know about his insurance  5. Hepatits listed in his problem list. He is unaware -Chronic Hepatitis panel.     Return in about 6 months (around 07/18/2016).  The patient was given clear instructions to go to ER or return to medical center if symptoms don't  improve, worsen or new problems develop. The patient verbalized understanding.    Henrietta Hoover FNP  01/19/2016, 9:46 AM

## 2016-01-20 LAB — PSA: PSA: 4.33 ng/mL — AB (ref <=4.00)

## 2016-01-22 LAB — HEPATITIS C RNA QUANTITATIVE

## 2016-01-22 LAB — HEPATITIS B E ANTIBODY: HEPATITIS BE ANTIBODY: NONREACTIVE

## 2016-01-22 NOTE — Progress Notes (Signed)
Left message for patient to call back.  Linda advised patient should have another PSA drawn in 30 days and check on his insurance.  Pt had [place] at last visit and we need to be sure we accept his current insurance. 

## 2016-01-22 NOTE — Progress Notes (Signed)
Left message for patient to call back.  [name] advised patient should have another PSA drawn in 30 days and check on his insurance.  Pt had [employer] at last visit and we need to be sure we accept his current insurance.

## 2016-01-26 ENCOUNTER — Other Ambulatory Visit: Payer: Self-pay | Admitting: Family Medicine

## 2016-01-26 DIAGNOSIS — B1910 Unspecified viral hepatitis B without hepatic coma: Secondary | ICD-10-CM

## 2016-02-27 ENCOUNTER — Other Ambulatory Visit: Payer: Self-pay

## 2016-03-21 ENCOUNTER — Encounter (HOSPITAL_COMMUNITY): Payer: Self-pay | Admitting: Emergency Medicine

## 2016-03-21 ENCOUNTER — Emergency Department (HOSPITAL_COMMUNITY)
Admission: EM | Admit: 2016-03-21 | Discharge: 2016-03-21 | Disposition: A | Discharge status: Home / Self Care | Payer: Self-pay | Attending: Emergency Medicine | Admitting: Emergency Medicine

## 2016-03-21 ENCOUNTER — Emergency Department (HOSPITAL_COMMUNITY): Payer: Self-pay

## 2016-03-21 DIAGNOSIS — Z880 Allergy status to penicillin: Secondary | ICD-10-CM | POA: Insufficient documentation

## 2016-03-21 DIAGNOSIS — I509 Heart failure, unspecified: Secondary | ICD-10-CM | POA: Insufficient documentation

## 2016-03-21 DIAGNOSIS — I129 Hypertensive chronic kidney disease with stage 1 through stage 4 chronic kidney disease, or unspecified chronic kidney disease: Secondary | ICD-10-CM | POA: Insufficient documentation

## 2016-03-21 DIAGNOSIS — Z9119 Patient's noncompliance with other medical treatment and regimen: Secondary | ICD-10-CM | POA: Insufficient documentation

## 2016-03-21 DIAGNOSIS — Z79899 Other long term (current) drug therapy: Secondary | ICD-10-CM | POA: Insufficient documentation

## 2016-03-21 DIAGNOSIS — Z9889 Other specified postprocedural states: Secondary | ICD-10-CM | POA: Insufficient documentation

## 2016-03-21 DIAGNOSIS — E669 Obesity, unspecified: Secondary | ICD-10-CM | POA: Insufficient documentation

## 2016-03-21 DIAGNOSIS — Z87891 Personal history of nicotine dependence: Secondary | ICD-10-CM | POA: Insufficient documentation

## 2016-03-21 DIAGNOSIS — Z7982 Long term (current) use of aspirin: Secondary | ICD-10-CM | POA: Insufficient documentation

## 2016-03-21 DIAGNOSIS — Z8669 Personal history of other diseases of the nervous system and sense organs: Secondary | ICD-10-CM | POA: Insufficient documentation

## 2016-03-21 DIAGNOSIS — I2510 Atherosclerotic heart disease of native coronary artery without angina pectoris: Secondary | ICD-10-CM | POA: Insufficient documentation

## 2016-03-21 DIAGNOSIS — N189 Chronic kidney disease, unspecified: Secondary | ICD-10-CM | POA: Insufficient documentation

## 2016-03-21 LAB — CBC
HCT: 42.1 % (ref 39.0–52.0)
Hemoglobin: 14.2 g/dL (ref 13.0–17.0)
MCH: 31.1 pg (ref 26.0–34.0)
MCHC: 33.7 g/dL (ref 30.0–36.0)
MCV: 92.3 fL (ref 78.0–100.0)
PLATELETS: 265 10*3/uL (ref 150–400)
RBC: 4.56 MIL/uL (ref 4.22–5.81)
RDW: 15 % (ref 11.5–15.5)
WBC: 6.5 10*3/uL (ref 4.0–10.5)

## 2016-03-21 LAB — BASIC METABOLIC PANEL
Anion gap: 9 (ref 5–15)
BUN: 17 mg/dL (ref 6–20)
CO2: 23 mmol/L (ref 22–32)
Calcium: 8.7 mg/dL — ABNORMAL LOW (ref 8.9–10.3)
Chloride: 105 mmol/L (ref 101–111)
Creatinine, Ser: 1.7 mg/dL — ABNORMAL HIGH (ref 0.61–1.24)
GFR calc Af Amer: 52 mL/min — ABNORMAL LOW (ref >60)
GFR calc non Af Amer: 45 mL/min — ABNORMAL LOW (ref >60)
Glucose, Bld: 121 mg/dL — ABNORMAL HIGH (ref 65–99)
Potassium: 4.6 mmol/L (ref 3.5–5.1)
Sodium: 137 mmol/L (ref 135–145)

## 2016-03-21 LAB — BRAIN NATRIURETIC PEPTIDE: B Natriuretic Peptide: 867.1 pg/mL — ABNORMAL HIGH (ref 0.0–100.0)

## 2016-03-21 LAB — I-STAT TROPONIN, ED: TROPONIN I, POC: 0.08 ng/mL (ref 0.00–0.08)

## 2016-03-21 MED ORDER — NITROGLYCERIN 0.4 MG SL SUBL
0.4000 mg | SUBLINGUAL_TABLET | SUBLINGUAL | Status: DC | PRN
  Administered 2016-03-21: 0.4 mg via SUBLINGUAL
  Filled 2016-03-21: qty 1

## 2016-03-21 MED ORDER — FUROSEMIDE 40 MG PO TABS: 40.0000 mg | ORAL_TABLET | Freq: Every day | ORAL | Status: DC

## 2016-03-21 MED ORDER — HYDRALAZINE HCL 20 MG/ML IJ SOLN
20.0000 mg | INTRAMUSCULAR | Status: AC
  Administered 2016-03-21: 20 mg via INTRAVENOUS
  Filled 2016-03-21: qty 1

## 2016-03-21 MED ORDER — FUROSEMIDE 10 MG/ML IJ SOLN
40.0000 mg | INTRAMUSCULAR | Status: AC
  Administered 2016-03-21: 40 mg via INTRAVENOUS
  Filled 2016-03-21: qty 4

## 2016-03-21 NOTE — ED Notes (Signed)
Pt. Stated, I've had a little difficulty breathing

## 2016-03-21 NOTE — ED Provider Notes (Signed)
Medical screening examination/treatment/procedure(s) were conducted as a shared visit with non-physician practitioner(s) and myself.  I personally evaluated the patient during the encounter.  Out of lasix for the last few days, here with increasing weight gain and mild dyspnea. On my exam no edema, S3 or JVD. Slightly tachpneic at 22 but no respiratory distress. HTN as well. Plan to give iv lasix here and double his lasix for the next few days until he can follow up with Dr. [name].    EKG Interpretation   Date/Time:  Sunday March 21 2016 08:08:58 EDT Ventricular Rate:  82 PR Interval:  152 QRS Duration: 98 QT Interval:  432 QTC Calculation: 504 R Axis:   22 Text Interpretation:  Normal sinus rhythm Possible Left atrial enlargement  Left ventricular hypertrophy T wave abnormality, consider lateral ischemia  Prolonged QT Abnormal ECG Confirmed by [facility] MD, Barbara Cower [phone]) on  03/21/2016 8:48:48 AM        Marily Memos, MD 03/21/16 1500

## 2016-03-21 NOTE — Discharge Instructions (Signed)
Recommend to double your lasix for the next 3 days (80mg = 2 tablets) and then go back to normal dose (40mg  daily= 1 tablet). Please follow-up with your primary care physician and/or cardiologist. Return here for any new or worsening symptoms-- worsening shortness of breath, chest pain, dizziness, weakness, significant weight gain, etc.

## 2016-03-21 NOTE — ED Provider Notes (Signed)
CSN: [id number]     Arrival date & time 03/21/16  4098 History   First MD Initiated Contact with Patient 03/21/16 1046     Chief Complaint  Patient presents with  . Shortness of Breath     (Consider location/radiation/quality/duration/timing/severity/associated sxs/prior Treatment) Patient is a 50 y.o. male presenting with shortness of breath. The history is provided by the patient and medical records.  Shortness of Breath Associated symptoms: cough     50 year old male with history of coronary artery disease, hypertension, chronic kidney disease, congestive heart failure, sleep apnea, presenting to the ED for shortness of breath. Patient states over the past several days his breathing has become more labored and he feels he cannot catch his breath. Symptoms are worse with lying flat and exertional activity. He denies any chest pain. He states he has been out of his Lasix for the past 3 days as there were no refills left on his prescription and he is waiting for his cardiologist to authorize further refills. He states he has felt a little weight gain at home, however has not actually weighed himself. He states he feels like he is back in congestive heart failure. He was admitted for the same earlier this year with similar symptoms. Also reports cough with some green/yellow sputum. No fever or chills. Patient hypertensive on arrival, state he has taken his home meds this morning already.  Past Medical History  Diagnosis Date  . CAD (coronary artery disease)     Small vessel with occluded small OM and nonobstructive large vessel disease (cath 02/2014)  . Obesity   . History of syncope 08/2008    Secondary to hypertension  . Tobacco abuse   . Hypertension, essential, benign     a. renal art [place] (6/14): no RAS  . Noncompliance   . CKD (chronic kidney disease)   . Nonischemic cardiomyopathy (HCC)     EF 45%  . Sleep apnea   . CHF (congestive heart failure) [facility])    Past Surgical History   Procedure Laterality Date  . None    . Left heart catheterization with coronary angiogram N/A 02/21/2014    Procedure: LEFT HEART CATHETERIZATION WITH CORONARY ANGIOGRAM;  Surgeon: Kathleene Hazel, MD;  Location: [facility] CATH LAB;  Service: Cardiovascular;  Laterality: N/A;  . Cardiac catheterization N/A 12/22/2015    Procedure: Left Heart Cath and Coronary Angiography;  Surgeon: Lyn Records, MD;  Location: [facility] INVASIVE CV LAB;  Service: Cardiovascular;  Laterality: N/A;   Family History  Problem Relation Age of Onset  . Lupus Mother   . Heart disease Maternal Grandfather   . Coronary artery disease Neg Hx    Social History  Substance Use Topics  . Smoking status: Former Smoker -- 0.10 packs/day for 32 years    Types: Cigarettes  . Smokeless tobacco: None     Comment: Started smoking at age 39 (22).  Cutting back  . Alcohol Use: No    Review of Systems  Respiratory: Positive for cough and shortness of breath.   All other systems reviewed and are negative.     Allergies  Penicillins and Shrimp  Home Medications   Prior to Admission medications   Medication Sig Start Date End Date Taking? Authorizing Provider  amLODipine (NORVASC) 10 MG tablet Take 1 tablet (10 mg total) by mouth daily. 12/23/15   Costin Otelia Sergeant, MD  aspirin EC 81 MG tablet Take 1 tablet (81 mg total) by mouth daily. 12/23/15   Costin  Otelia Sergeant, MD  atorvastatin (LIPITOR) 80 MG tablet Take 1 tablet (80 mg total) by mouth daily at 6 PM. 12/23/15   Costin Otelia Sergeant, MD  carvedilol (COREG) 12.5 MG tablet Take 1 tablet (12.5 mg total) by mouth 2 (two) times daily with a meal. 12/23/15   Costin Otelia Sergeant, MD  furosemide (LASIX) 40 MG tablet Take 1 tablet (40 mg total) by mouth daily. 12/23/15   Costin Otelia Sergeant, MD  hydrALAZINE (APRESOLINE) 100 MG tablet Take 1 tablet (100 mg total) by mouth every 8 (eight) hours. 12/23/15   Costin Otelia Sergeant, MD  isosorbide mononitrate (IMDUR) 120 MG 24 hr tablet Take 1 tablet (120  mg total) by mouth daily. 12/23/15   Costin Otelia Sergeant, MD  lisinopril (PRINIVIL,ZESTRIL) 40 MG tablet Take 1 tablet (40 mg total) by mouth daily. 12/23/15   Costin Otelia Sergeant, MD  Multiple Vitamin (MULTIVITAMIN WITH MINERALS) TABS tablet Take 1 tablet by mouth daily.    Historical Provider, MD  nicotine (NICODERM CQ - DOSED IN MG/24 HOURS) 21 mg/24hr patch Place 1 patch (21 mg total) onto the skin daily. 12/23/15   Costin Otelia Sergeant, MD  nitroGLYCERIN (NITROSTAT) 0.4 MG SL tablet Place 1 tablet (0.4 mg total) under the tongue every 5 (five) minutes as needed. For chest pain 12/23/15   Leatha Gilding, MD  potassium chloride SA (K-DUR,KLOR-CON) 20 MEQ tablet Take 1 tablet (20 mEq total) by mouth 2 (two) times daily. 12/23/15   Costin Otelia Sergeant, MD   BP 188/115 mmHg  Pulse 78  Temp(Src) 98.2 F (36.8 C) (Oral)  Resp 18  Wt 98.975 kg  SpO2 98%   Physical Exam  Constitutional: He is oriented to person, place, and time. He appears well-developed and well-nourished. No distress.  HENT:  Head: Normocephalic and atraumatic.  Mouth/Throat: Oropharynx is clear and moist. No oropharyngeal exudate.  Eyes: Conjunctivae and EOM are normal. Pupils are equal, round, and reactive to light.  Neck: Normal range of motion. Neck supple.  Cardiovascular: Normal rate, regular rhythm and normal heart sounds.   Pulmonary/Chest: Effort normal. No respiratory distress. He has no wheezes. He has no rhonchi. He has rales.  Normal work of breathing, no distress, able to speak in full sentences without difficulty, mild rales noted at bases  Abdominal: Soft. Bowel sounds are normal.  Musculoskeletal: Normal range of motion.  Trace pitting edema of bilateral ankles  Neurological: He is alert and oriented to person, place, and time.  Skin: Skin is warm. He is not diaphoretic.  Psychiatric: He has a normal mood and affect.  Nursing note and vitals reviewed.   ED Course  Procedures (including critical care time) Labs  Review Labs Reviewed  BASIC METABOLIC PANEL - Abnormal; Notable for the following:    Glucose, Bld 121 (*)    Creatinine, Ser 1.70 (*)    Calcium 8.7 (*)    GFR calc non Af Amer 45 (*)    GFR calc Af Amer 52 (*)    All other components within normal limits  BRAIN NATRIURETIC PEPTIDE - Abnormal; Notable for the following:    B Natriuretic Peptide 867.1 (*)    All other components within normal limits  CBC  I-STAT TROPOININ, ED    Imaging Review Dg Chest 2 View  03/21/2016  CLINICAL DATA:  Shortness of breath, perspiration and productive cough. Former smoker. History of hypertension and CHF. EXAM: CHEST  2 VIEW COMPARISON:  Chest x-ray dated 12/15/2015. FINDINGS: Suspect chronic bronchitic  changes centrally in the perihilar regions. Lungs otherwise clear. No evidence of consolidating pneumonia. No pleural effusion or pneumothorax seen. Cardiomegaly is stable.  No evidence of active CHF at this time. Osseous and soft tissue structures about the chest are unremarkable. Mild degenerative change again noted within the thoracolumbar spine. IMPRESSION: 1. No acute findings.  No evidence of pneumonia. 2. Suspect chronic bronchitic changes centrally. 3. Stable cardiomegaly.  No evidence of active CHF at this time. Electronically Signed   By: Bary Richard M.D.   On: 03/21/2016 09:21   I have personally reviewed and evaluated these images and lab results as part of my medical decision-making.   EKG Interpretation   Date/Time:  Sunday March 21 2016 08:08:58 EDT Ventricular Rate:  82 PR Interval:  152 QRS Duration: 98 QT Interval:  432 QTC Calculation: 504 R Axis:   22 Text Interpretation:  Normal sinus rhythm Possible Left atrial enlargement  Left ventricular hypertrophy T wave abnormality, consider lateral ischemia  Prolonged QT Abnormal ECG Confirmed by [facility]ESNER MD, Barbara Cower ([id number]) on  03/21/2016 8:48:48 AM      MDM   Final diagnoses:  Acute on chronic congestive heart failure, unspecified  congestive heart failure type (HCC)   50 year old male here with shortness of breath. History of CHF, has been out of his Lasix for the past 3 days. He is hypertensive, reports he has been fine with his blood pressure medications. He denies any chest pain currently. Labs with elevated BNP at 867, SrCr appears baseline.  Troponin negative.  Chest x-ray without vascular congestion or noted pulmonary edema. Patient was given dose of IV Lasix here in the ED with improvement of his symptoms. He was also given extra dose of hydralazine with improvement of his blood pressure. He states he is overall feeling better. His oxygen saturation has remained stable on room air, his breathing is unlabored. Discussed possibility of admission for continued diuresis and close monitoring of his blood pressure, however patient prefers to go home and he is comfortable doing so. Feel this is reasonable. Will write prescription for his Lasix, recommended to double dose for the next 3 days then resume normal dosing.  Instructed to monitor his weight closely over the next several days as well as monitoring his blood pressure. He is to follow-up with his PCP as well as cardiologist as soon as possible.  Discussed plan with patient, he/she acknowledged understanding and agreed with plan of care.  Return precautions given for new or worsening symptoms.  Case discussed with attending physician, Dr. Clayborne Dana, who evaluated patient and agrees with assessment and plan of care.  Garlon Hatchet, PA-C 03/21/16 1459  Marily Memos, MD 03/24/16 [phone]

## 2016-03-31 NOTE — Progress Notes (Signed)
HPI: FU CAD and hypertension. Cardiac catheterization January 2017 showed a 90% first marginal, occluded ramus, 50% ost LAD, 70% circumflex, 80% distal LAD and 65% right coronary artery; EF 25-30; LVEDP 40. Echocardiogram January 2017 showed ejection fraction 20-25%, moderate left ventricular hypertrophy, restrictive filling with elevated left ventricular filling pressures, severe left atrial enlargement and moderate right atrial enlargement. Patient was seen in the emergency room recently for congestive heart failure and Lasix increased to 80 mg daily. Since last seen, He notes dyspnea on exertion and occasional orthopnea. He denies pedal edema or syncope. He has chest pain with exertion which is unchanged compared to the time of his recent catheterization. He is taking hydralazine only twice daily and is not taking imdur because of side effects.  Current Outpatient Prescriptions  Medication Sig Dispense Refill  . amLODipine (NORVASC) 10 MG tablet Take 1 tablet (10 mg total) by mouth daily. 30 tablet 1  . aspirin EC 81 MG tablet Take 1 tablet (81 mg total) by mouth daily. 90 tablet 3  . atorvastatin (LIPITOR) 80 MG tablet Take 1 tablet (80 mg total) by mouth daily at 6 PM. 30 tablet 1  . carvedilol (COREG) 12.5 MG tablet Take 1 tablet (12.5 mg total) by mouth 2 (two) times daily with a meal. 60 tablet 1  . furosemide (LASIX) 40 MG tablet Take 1 tablet (40 mg total) by mouth daily. 30 tablet 0  . hydrALAZINE (APRESOLINE) 100 MG tablet Take 1 tablet (100 mg total) by mouth every 8 (eight) hours. 90 tablet 1  . isosorbide mononitrate (IMDUR) 120 MG 24 hr tablet Take 1 tablet (120 mg total) by mouth daily. 30 tablet 1  . lisinopril (PRINIVIL,ZESTRIL) 40 MG tablet Take 1 tablet (40 mg total) by mouth daily. 30 tablet 1  . Multiple Vitamin (MULTIVITAMIN WITH MINERALS) TABS tablet Take 1 tablet by mouth daily.    . nitroGLYCERIN (NITROSTAT) 0.4 MG SL tablet Place 1 tablet (0.4 mg total) under the  tongue every 5 (five) minutes as needed. For chest pain 30 tablet 1  . potassium chloride SA (K-DUR,KLOR-CON) 20 MEQ tablet Take 1 tablet (20 mEq total) by mouth 2 (two) times daily. 30 tablet 1   No current facility-administered medications for this visit.     Past Medical History  Diagnosis Date  . CAD (coronary artery disease)     Small vessel with occluded small OM and nonobstructive large vessel disease (cath 02/2014)  . Obesity   . History of syncope 08/2008    Secondary to hypertension  . Tobacco abuse   . Hypertension, essential, benign     a. renal art [place] (6/14): no RAS  . Noncompliance   . CKD (chronic kidney disease)   . Nonischemic cardiomyopathy (HCC)     EF 45%  . Sleep apnea   . CHF (congestive heart failure) [facility])     Past Surgical History  Procedure Laterality Date  . None    . Left heart catheterization with coronary angiogram N/A 02/21/2014    Procedure: LEFT HEART CATHETERIZATION WITH CORONARY ANGIOGRAM;  Surgeon: Kathleene Hazel, MD;  Location: [facility] CATH LAB;  Service: Cardiovascular;  Laterality: N/A;  . Cardiac catheterization N/A 12/22/2015    Procedure: Left Heart Cath and Coronary Angiography;  Surgeon: Lyn Records, MD;  Location: [facility] INVASIVE CV LAB;  Service: Cardiovascular;  Laterality: N/A;    Social History   Social History  . Marital Status: Single    Spouse Name:  N/A  . Number of Children: 5  . Years of Education: N/A   Occupational History  . [employer]   .     Social History Main Topics  . Smoking status: Former Smoker -- 0.10 packs/day for 32 years    Types: Cigarettes  . Smokeless tobacco: Not on file     Comment: Started smoking at age 50 (13).  Cutting back  . Alcohol Use: No  . Drug Use: No  . Sexual Activity: Not Currently   Other Topics Concern  . Not on file   Social History Narrative   Lives with girlfriend.    Family History  Problem Relation Age of Onset  . Lupus Mother   . Heart disease Maternal  Grandfather   . Coronary artery disease Neg Hx     ROS: no fevers or chills, productive cough, hemoptysis, dysphasia, odynophagia, melena, hematochezia, dysuria, hematuria, rash, seizure activity, orthopnea, PND, pedal edema, claudication. Remaining systems are negative.  Physical Exam: Well-developed well-nourished in no acute distress.  Skin is warm and dry.  HEENT is normal.  Neck is supple.  Chest is clear to auscultation with normal expansion.  Cardiovascular exam is regular rate and rhythm.  Abdominal exam nontender or distended. No masses palpated. Extremities show no edema. neuro grossly intact

## 2016-04-08 ENCOUNTER — Other Ambulatory Visit: Payer: Self-pay

## 2016-04-08 ENCOUNTER — Encounter: Payer: Self-pay | Admitting: Cardiology

## 2016-04-08 ENCOUNTER — Ambulatory Visit (INDEPENDENT_AMBULATORY_CARE_PROVIDER_SITE_OTHER): Payer: Self-pay | Admitting: Cardiology

## 2016-04-08 VITALS — BP 190/100 | HR 82 | Ht 67.0 in | Wt 216.2 lb

## 2016-04-08 DIAGNOSIS — I5023 Acute on chronic systolic (congestive) heart failure: Secondary | ICD-10-CM

## 2016-04-08 DIAGNOSIS — I10 Essential (primary) hypertension: Secondary | ICD-10-CM

## 2016-04-08 DIAGNOSIS — N183 Chronic kidney disease, stage 3 unspecified: Secondary | ICD-10-CM

## 2016-04-08 MED ORDER — ISOSORBIDE MONONITRATE ER 30 MG PO TB24: 30.0000 mg | ORAL_TABLET | Freq: Every day | ORAL | Status: DC

## 2016-04-08 MED ORDER — CARVEDILOL 25 MG PO TABS: 25.0000 mg | ORAL_TABLET | Freq: Two times a day (BID) | ORAL | Status: DC

## 2016-04-08 MED ORDER — FUROSEMIDE 40 MG PO TABS: 80.0000 mg | ORAL_TABLET | Freq: Every day | ORAL | Status: DC

## 2016-04-08 MED ORDER — HYDRALAZINE HCL 100 MG PO TABS: 100.0000 mg | ORAL_TABLET | Freq: Three times a day (TID) | ORAL | Status: DC

## 2016-04-08 NOTE — Patient Instructions (Signed)
Medication Instructions:   INCREASE HYDRALAZINE TO 100 MG THREE TIMES DAILY  START ISOSORBIDE 30 MG ONCE DAILY= TAKE 1/2 TABLET ONCE DAILY FOR THE FIRST 3 DAYS  INCREASE CARVEDILOL TO 25 MG TWICE DAILY= 2 OF THE 12.5 MG TABLETS TWICE DAILY  Labwork:  Your physician recommends that you HAVE LAB WORK TODAY  Testing/Procedures:  Your physician has requested that you have a renal artery duplex. During this test, an ultrasound is used to evaluate blood flow to the kidneys. Allow one hour for this exam. Do not eat after midnight the day before and avoid carbonated beverages. Take your medications as you usually do.    Follow-Up:  Your physician recommends that you schedule a follow-up appointment in: ONE WEEK IN THE HYPERTENSION CLINIC  Your physician recommends that you schedule a follow-up appointment in: 3 MONTHS WITH DR [name]

## 2016-04-08 NOTE — Assessment & Plan Note (Addendum)
Patient's blood pressure is elevated and is likely the cause of his cardiomyopathy and renal insufficiency. I asked him to increase his hydralazine to 100 mg 3 times a day. I will resume isosorbide 30 mg daily to see if he will tolerate. Increase carvedilol to 25 mg twice a day. He will return in one week to have his blood pressure checked. I have asked him to purchase a monitor for home use and keep records of his blood pressure. I instructed him on low-sodium diet. I also explained the complications of hypertension including congestive heart failure and renal failure. Check renal Dopplers to screen for renal artery stenosis.

## 2016-04-08 NOTE — Assessment & Plan Note (Signed)
Patient's LV function is reduced. This is likely secondary to uncontrolled hypertension.Increase hydralazine and Coreg as outlined under hypertension. Add isosorbide. Continue ACE inhibitor. His Lasix was increased recently to 80 mg daily. Check potassium and renal function. He understands low-sodium diet. He weighs himself daily and take additional 40 mg for weight gain of 2 pounds. Once his pressure is controlled I would like to repeat his echocardiogram to see if LV function has improved. If not he may require ICD in the future if he can demonstrate compliance and avoidance of substance abuse.

## 2016-04-08 NOTE — Assessment & Plan Note (Signed)
Continue aspirin and statin. He has some exertional chest pain which is unchanged compared to when he had his heart catheterization.

## 2016-04-08 NOTE — Assessment & Plan Note (Signed)
Check potassium and renal function after increasingLasix.

## 2016-04-13 ENCOUNTER — Inpatient Hospital Stay (HOSPITAL_COMMUNITY): Admission: RE | Admit: 2016-04-13 | Payer: Self-pay | Source: Ambulatory Visit

## 2016-04-15 ENCOUNTER — Encounter (HOSPITAL_COMMUNITY): Payer: Self-pay

## 2016-04-16 ENCOUNTER — Ambulatory Visit: Payer: Self-pay | Admitting: Pharmacist Clinician (PhC)/ Clinical Pharmacy Specialist

## 2016-04-20 ENCOUNTER — Other Ambulatory Visit: Payer: Self-pay | Admitting: Cardiology

## 2016-04-20 ENCOUNTER — Inpatient Hospital Stay (HOSPITAL_COMMUNITY): Admission: RE | Admit: 2016-04-20 | Payer: Self-pay | Source: Ambulatory Visit

## 2016-04-20 DIAGNOSIS — I10 Essential (primary) hypertension: Secondary | ICD-10-CM

## 2016-04-30 ENCOUNTER — Emergency Department (HOSPITAL_COMMUNITY): Payer: Self-pay

## 2016-04-30 ENCOUNTER — Inpatient Hospital Stay (HOSPITAL_COMMUNITY)
Admission: EM | Admit: 2016-04-30 | Discharge: 2016-05-03 | DRG: 291 | Disposition: A | Discharge status: Home / Self Care | Payer: Self-pay | Attending: Cardiovascular Disease | Admitting: Cardiovascular Disease

## 2016-04-30 ENCOUNTER — Encounter (HOSPITAL_COMMUNITY): Payer: Self-pay

## 2016-04-30 DIAGNOSIS — R001 Bradycardia, unspecified: Secondary | ICD-10-CM | POA: Diagnosis not present

## 2016-04-30 DIAGNOSIS — N183 Chronic kidney disease, stage 3 unspecified: Secondary | ICD-10-CM | POA: Diagnosis present

## 2016-04-30 DIAGNOSIS — I5031 Acute diastolic (congestive) heart failure: Secondary | ICD-10-CM

## 2016-04-30 DIAGNOSIS — I10 Essential (primary) hypertension: Secondary | ICD-10-CM

## 2016-04-30 DIAGNOSIS — I130 Hypertensive heart and chronic kidney disease with heart failure and stage 1 through stage 4 chronic kidney disease, or unspecified chronic kidney disease: Principal | ICD-10-CM | POA: Diagnosis present

## 2016-04-30 DIAGNOSIS — I428 Other cardiomyopathies: Secondary | ICD-10-CM | POA: Diagnosis present

## 2016-04-30 DIAGNOSIS — E876 Hypokalemia: Secondary | ICD-10-CM | POA: Diagnosis present

## 2016-04-30 DIAGNOSIS — I5043 Acute on chronic combined systolic (congestive) and diastolic (congestive) heart failure: Secondary | ICD-10-CM | POA: Diagnosis present

## 2016-04-30 DIAGNOSIS — I2583 Coronary atherosclerosis due to lipid rich plaque: Secondary | ICD-10-CM | POA: Diagnosis present

## 2016-04-30 DIAGNOSIS — Z9114 Patient's other noncompliance with medication regimen: Secondary | ICD-10-CM

## 2016-04-30 DIAGNOSIS — I2510 Atherosclerotic heart disease of native coronary artery without angina pectoris: Secondary | ICD-10-CM | POA: Diagnosis present

## 2016-04-30 DIAGNOSIS — E669 Obesity, unspecified: Secondary | ICD-10-CM | POA: Diagnosis present

## 2016-04-30 DIAGNOSIS — Z87891 Personal history of nicotine dependence: Secondary | ICD-10-CM

## 2016-04-30 DIAGNOSIS — I160 Hypertensive urgency: Secondary | ICD-10-CM | POA: Diagnosis present

## 2016-04-30 DIAGNOSIS — E785 Hyperlipidemia, unspecified: Secondary | ICD-10-CM

## 2016-04-30 DIAGNOSIS — Z7982 Long term (current) use of aspirin: Secondary | ICD-10-CM

## 2016-04-30 DIAGNOSIS — G4730 Sleep apnea, unspecified: Secondary | ICD-10-CM | POA: Diagnosis present

## 2016-04-30 DIAGNOSIS — I472 Ventricular tachycardia: Secondary | ICD-10-CM | POA: Diagnosis present

## 2016-04-30 DIAGNOSIS — Z79899 Other long term (current) drug therapy: Secondary | ICD-10-CM

## 2016-04-30 DIAGNOSIS — I509 Heart failure, unspecified: Secondary | ICD-10-CM | POA: Insufficient documentation

## 2016-04-30 DIAGNOSIS — Z6832 Body mass index (BMI) 32.0-32.9, adult: Secondary | ICD-10-CM

## 2016-04-30 HISTORY — DX: Hypokalemia: E87.6

## 2016-04-30 HISTORY — DX: Other cardiomyopathies: I42.8

## 2016-04-30 LAB — CBC
HCT: 41.3 % (ref 39.0–52.0)
HEMATOCRIT: 40.4 % (ref 39.0–52.0)
HEMOGLOBIN: 13.7 g/dL (ref 13.0–17.0)
Hemoglobin: 13.3 g/dL (ref 13.0–17.0)
MCH: 30.4 pg (ref 26.0–34.0)
MCH: 30.6 pg (ref 26.0–34.0)
MCHC: 32.9 g/dL (ref 30.0–36.0)
MCHC: 33.2 g/dL (ref 30.0–36.0)
MCV: 92.2 fL (ref 78.0–100.0)
MCV: 92.4 fL (ref 78.0–100.0)
PLATELETS: 261 10*3/uL (ref 150–400)
Platelets: 252 10*3/uL (ref 150–400)
RBC: 4.38 MIL/uL (ref 4.22–5.81)
RBC: 4.47 MIL/uL (ref 4.22–5.81)
RDW: 14.5 % (ref 11.5–15.5)
RDW: 14.5 % (ref 11.5–15.5)
WBC: 6.5 10*3/uL (ref 4.0–10.5)
WBC: 8.7 10*3/uL (ref 4.0–10.5)

## 2016-04-30 LAB — BASIC METABOLIC PANEL
Anion gap: 9 (ref 5–15)
BUN: 27 mg/dL — AB (ref 6–20)
CHLORIDE: 105 mmol/L (ref 101–111)
CO2: 25 mmol/L (ref 22–32)
CREATININE: 1.78 mg/dL — AB (ref 0.61–1.24)
Calcium: 8.9 mg/dL (ref 8.9–10.3)
GFR calc Af Amer: 50 mL/min — ABNORMAL LOW (ref >60)
GFR calc non Af Amer: 43 mL/min — ABNORMAL LOW (ref >60)
Glucose, Bld: 124 mg/dL — ABNORMAL HIGH (ref 65–99)
POTASSIUM: 3.4 mmol/L — AB (ref 3.5–5.1)
Sodium: 139 mmol/L (ref 135–145)

## 2016-04-30 LAB — BRAIN NATRIURETIC PEPTIDE: B Natriuretic Peptide: 1257.6 pg/mL — ABNORMAL HIGH (ref 0.0–100.0)

## 2016-04-30 LAB — CREATININE, SERUM
CREATININE: 1.76 mg/dL — AB (ref 0.61–1.24)
GFR calc Af Amer: 50 mL/min — ABNORMAL LOW (ref >60)
GFR, EST NON AFRICAN AMERICAN: 43 mL/min — AB (ref >60)

## 2016-04-30 LAB — TROPONIN I: Troponin I: 0.08 ng/mL — ABNORMAL HIGH (ref <0.031)

## 2016-04-30 MED ORDER — HYDRALAZINE HCL 20 MG/ML IJ SOLN
10.0000 mg | INTRAMUSCULAR | Status: DC | PRN
  Administered 2016-04-30 – 2016-05-02 (×7): 10 mg via INTRAVENOUS
  Filled 2016-04-30 (×7): qty 1

## 2016-04-30 MED ORDER — HEPARIN SODIUM (PORCINE) 5000 UNIT/ML IJ SOLN
5000.0000 [IU] | Freq: Three times a day (TID) | INTRAMUSCULAR | Status: DC
  Administered 2016-04-30 – 2016-05-03 (×10): 5000 [IU] via SUBCUTANEOUS
  Filled 2016-04-30 (×9): qty 1

## 2016-04-30 MED ORDER — HYDRALAZINE HCL 50 MG PO TABS
100.0000 mg | ORAL_TABLET | Freq: Three times a day (TID) | ORAL | Status: DC
  Administered 2016-04-30 – 2016-05-01 (×5): 100 mg via ORAL
  Filled 2016-04-30 (×5): qty 2

## 2016-04-30 MED ORDER — ONDANSETRON HCL 4 MG/2ML IJ SOLN
4.0000 mg | Freq: Four times a day (QID) | INTRAMUSCULAR | Status: DC | PRN
Start: 2016-04-30 — End: 2016-05-03
  Administered 2016-05-02: 4 mg via INTRAVENOUS
  Filled 2016-04-30: qty 2

## 2016-04-30 MED ORDER — LISINOPRIL 40 MG PO TABS
40.0000 mg | ORAL_TABLET | Freq: Every day | ORAL | Status: DC
  Administered 2016-05-01 – 2016-05-03 (×3): 40 mg via ORAL
  Filled 2016-04-30 (×3): qty 1

## 2016-04-30 MED ORDER — CARVEDILOL 25 MG PO TABS
25.0000 mg | ORAL_TABLET | Freq: Two times a day (BID) | ORAL | Status: DC
  Administered 2016-04-30 – 2016-05-01 (×4): 25 mg via ORAL
  Filled 2016-04-30 (×4): qty 1

## 2016-04-30 MED ORDER — ISOSORBIDE MONONITRATE ER 30 MG PO TB24: 30.0000 mg | ORAL_TABLET | Freq: Every day | ORAL | Status: DC

## 2016-04-30 MED ORDER — SODIUM CHLORIDE 0.9% FLUSH: 3.0000 mL | INTRAVENOUS | Status: DC | PRN

## 2016-04-30 MED ORDER — SODIUM CHLORIDE 0.9 % IV SOLN: 250.0000 mL | INTRAVENOUS | Status: DC | PRN

## 2016-04-30 MED ORDER — AMLODIPINE BESYLATE 5 MG PO TABS
5.0000 mg | ORAL_TABLET | Freq: Every day | ORAL | Status: DC
  Administered 2016-04-30 – 2016-05-01 (×2): 5 mg via ORAL
  Filled 2016-04-30: qty 1

## 2016-04-30 MED ORDER — ISOSORBIDE MONONITRATE ER 30 MG PO TB24
30.0000 mg | ORAL_TABLET | Freq: Every day | ORAL | Status: DC
  Administered 2016-04-30: 15 mg via ORAL
  Administered 2016-05-01 – 2016-05-03 (×3): 30 mg via ORAL
  Filled 2016-04-30 (×5): qty 1

## 2016-04-30 MED ORDER — CARVEDILOL 25 MG PO TABS: 25.0000 mg | ORAL_TABLET | Freq: Two times a day (BID) | ORAL | Status: DC

## 2016-04-30 MED ORDER — HYDRALAZINE HCL 25 MG PO TABS
100.0000 mg | ORAL_TABLET | Freq: Once | ORAL | Status: AC
  Administered 2016-04-30: 100 mg via ORAL
  Filled 2016-04-30: qty 4

## 2016-04-30 MED ORDER — LISINOPRIL 20 MG PO TABS
40.0000 mg | ORAL_TABLET | Freq: Once | ORAL | Status: AC
  Administered 2016-04-30: 40 mg via ORAL
  Filled 2016-04-30: qty 2

## 2016-04-30 MED ORDER — SODIUM CHLORIDE 0.9% FLUSH
3.0000 mL | Freq: Two times a day (BID) | INTRAVENOUS | Status: DC
  Administered 2016-04-30 – 2016-05-02 (×6): 3 mL via INTRAVENOUS

## 2016-04-30 MED ORDER — NITROGLYCERIN 0.4 MG SL SUBL: 0.4000 mg | SUBLINGUAL_TABLET | SUBLINGUAL | Status: DC | PRN

## 2016-04-30 MED ORDER — ASPIRIN EC 81 MG PO TBEC
81.0000 mg | DELAYED_RELEASE_TABLET | Freq: Every day | ORAL | Status: DC
  Administered 2016-04-30 – 2016-05-03 (×4): 81 mg via ORAL
  Filled 2016-04-30 (×4): qty 1

## 2016-04-30 MED ORDER — POTASSIUM CHLORIDE CRYS ER 20 MEQ PO TBCR
60.0000 meq | EXTENDED_RELEASE_TABLET | Freq: Once | ORAL | Status: AC
  Administered 2016-04-30: 60 meq via ORAL
  Filled 2016-04-30: qty 3

## 2016-04-30 MED ORDER — FUROSEMIDE 10 MG/ML IJ SOLN
80.0000 mg | Freq: Once | INTRAMUSCULAR | Status: AC
  Administered 2016-04-30: 80 mg via INTRAVENOUS
  Filled 2016-04-30: qty 8

## 2016-04-30 MED ORDER — ATORVASTATIN CALCIUM 80 MG PO TABS
80.0000 mg | ORAL_TABLET | Freq: Every day | ORAL | Status: DC
  Administered 2016-04-30 – 2016-05-02 (×3): 80 mg via ORAL
  Filled 2016-04-30 (×3): qty 1

## 2016-04-30 MED ORDER — ACETAMINOPHEN 325 MG PO TABS
650.0000 mg | ORAL_TABLET | ORAL | Status: DC | PRN
  Administered 2016-04-30 – 2016-05-01 (×3): 650 mg via ORAL
  Filled 2016-04-30 (×3): qty 2

## 2016-04-30 MED ORDER — FUROSEMIDE 10 MG/ML IJ SOLN
80.0000 mg | Freq: Two times a day (BID) | INTRAMUSCULAR | Status: DC
  Administered 2016-04-30 – 2016-05-01 (×3): 80 mg via INTRAVENOUS
  Filled 2016-04-30 (×3): qty 8

## 2016-04-30 NOTE — ED Notes (Signed)
Pt here with c/o SOB and cough, onset last night before bed. RR does appear slightly labored. Denies CP now but states he did have CP last night.

## 2016-04-30 NOTE — ED Notes (Signed)
Morrie Sheldon from 3W informed us that pt's status was being upgraded to step-down

## 2016-04-30 NOTE — Progress Notes (Signed)
Some improvement in SOB since admission but he still feels dyspneic. Will start Lasix 80 IV BID and follow renal function closely.   Corine Shelter PA-C 04/30/2016 2:39 PM

## 2016-04-30 NOTE — H&P (Signed)
Patient ID: [name] MRN: [id number], DOB/AGE: [id number]   Admit date: 04/30/2016  Requesting Physician: Primary Physician: Concepcion Living, NP Primary Cardiologist: Maryln Manuel Reason for admission: ADHF, HTN urgency  Pt. Profile:  [name] is a 50 y.o. male with a history of refractory HTN, CAD, dyslipidemia, and HFrEF who presented to the ED with 2 days of worsening SOB and orthopnea.  The pt reports that 2 days prior to admission he noted increased dyspnea with moderate exertion, and this has progressed over the past 48H to SOB at rest.  He also c/o orthopnea, but denies PND.  He has not noted increased LE edema but has noted increased abdominal girth.  [name] denies any recent illness, dietary indiscretions, or missed medication doses including his diuretic.  He endorses strict adherence to his entire prescribed medication regimen.  Since arrival to the ED, the pt has remained hemodynamically stable and is saturating well on RA, although he continues to experience dyspnea.  Given concern for ADHF, as well as HTN urgency, the ED physicians have requested cardiology admission.   Problem List  Past Medical History  Diagnosis Date  . CAD (coronary artery disease)     Small vessel with occluded small OM and nonobstructive large vessel disease (cath 02/2014)  . Obesity   . History of syncope 08/2008    Secondary to hypertension  . Tobacco abuse   . Hypertension, essential, benign     a. renal art [place] (6/14): no RAS  . Noncompliance   . CKD (chronic kidney disease)   . Nonischemic cardiomyopathy (HCC)     EF 45%  . Sleep apnea   . CHF (congestive heart failure) [facility])     Past Surgical History  Procedure Laterality Date  . None    . Left heart catheterization with coronary angiogram N/A 02/21/2014    Procedure: LEFT HEART CATHETERIZATION WITH CORONARY ANGIOGRAM;  Surgeon: Kathleene Hazel, MD;  Location: [facility] CATH LAB;  Service: Cardiovascular;  Laterality:  N/A;  . Cardiac catheterization N/A 12/22/2015    Procedure: Left Heart Cath and Coronary Angiography;  Surgeon: Lyn Records, MD;  Location: [facility] INVASIVE CV LAB;  Service: Cardiovascular;  Laterality: N/A;     Allergies  Allergies  Allergen Reactions  . Penicillins Other (See Comments)    Patient states that it makes his throat itch really bad, and feels like it is closing up.   . Shrimp [Shellfish Allergy]     Throat swells     Home Medications  Prior to Admission medications   Medication Sig Start Date End Date Taking? Authorizing Provider  aspirin EC 81 MG tablet Take 1 tablet (81 mg total) by mouth daily. 12/23/15  Yes Costin Otelia Sergeant, MD  atorvastatin (LIPITOR) 80 MG tablet Take 1 tablet (80 mg total) by mouth daily at 6 PM. 12/23/15  Yes Costin Otelia Sergeant, MD  carvedilol (COREG) 25 MG tablet Take 1 tablet (25 mg total) by mouth 2 (two) times daily with a meal. 04/08/16  Yes Lewayne Bunting, MD  furosemide (LASIX) 40 MG tablet Take 2 tablets (80 mg total) by mouth daily. 04/08/16  Yes Lewayne Bunting, MD  hydrALAZINE (APRESOLINE) 100 MG tablet Take 1 tablet (100 mg total) by mouth 3 (three) times daily. 04/08/16  Yes Lewayne Bunting, MD  isosorbide mononitrate (IMDUR) 30 MG 24 hr tablet Take 1 tablet (30 mg total) by mouth daily. 04/08/16  Yes Lewayne Bunting, MD  lisinopril (  PRINIVIL,ZESTRIL) 40 MG tablet Take 1 tablet (40 mg total) by mouth daily. 12/23/15  Yes Costin Otelia Sergeant, MD  Multiple Vitamin (MULTIVITAMIN WITH MINERALS) TABS tablet Take 1 tablet by mouth daily.   Yes Historical Provider, MD  nitroGLYCERIN (NITROSTAT) 0.4 MG SL tablet Place 1 tablet (0.4 mg total) under the tongue every 5 (five) minutes as needed. For chest pain 12/23/15  Yes Costin Otelia Sergeant, MD  potassium chloride SA (K-DUR,KLOR-CON) 20 MEQ tablet Take 1 tablet (20 mEq total) by mouth 2 (two) times daily. 12/23/15  Yes Costin Otelia Sergeant, MD    Family History  Family History  Problem Relation Age of Onset    . Lupus Mother   . Heart disease Maternal Grandfather   . Coronary artery disease Neg Hx    Family Status  Relation Status Death Age  . Mother Deceased 47s  . Father Deceased 59s     Social History  Social History   Social History  . Marital Status: Single    Spouse Name: N/A  . Number of Children: 5  . Years of Education: N/A   Occupational History  . [employer]   .     Social History Main Topics  . Smoking status: Former Smoker -- 0.10 packs/day for 32 years    Types: Cigarettes  . Smokeless tobacco: Not on file     Comment: Started smoking at age 63 (47).  Cutting back  . Alcohol Use: No  . Drug Use: No  . Sexual Activity: Not Currently   Other Topics Concern  . Not on file   Social History Narrative   Lives with girlfriend.     Review of Systems General:  No chills, fever, night sweats or weight changes.  Cardiovascular:  No chest pain, dyspnea on exertion, edema, orthopnea, palpitations, paroxysmal nocturnal dyspnea. Dermatological: No rash, lesions/masses Respiratory: No cough, dyspnea Urologic: No hematuria, dysuria Abdominal:   No nausea, vomiting, diarrhea, bright red blood per rectum, melena, or hematemesis Neurologic:  No visual changes, wkns, changes in mental status. All other systems reviewed and are otherwise negative except as noted above.  Physical Exam  Blood pressure 209/129, pulse 77, temperature 97.9 F (36.6 C), temperature source Oral, resp. rate 30, SpO2 98 %.  General: Pleasant, NAD Psych: Normal affect. Neuro: Alert and oriented X 3. Moves all extremities spontaneously. HEENT: Normal  Neck: Supple without bruits Lungs: diffuse bilaterally expiratory wheezing, soft bibasilar crackles Heart: RRR, +S1 +S2, no appreciable m/r/g, +JVD with JVP to the level of the mandible at 45 degrees, trace LE edema Abdomen: Soft, non-tender, distended with +fluid wave, BS + x 4.  Extremities: No clubbing or cyanosis . DP/PT/Radials 2+ and  equal bilaterally.  Labs   Recent Labs  04/30/16 0257  TROPONINI 0.08*   Lab Results  Component Value Date   WBC 6.5 04/30/2016   HGB 13.3 04/30/2016   HCT 40.4 04/30/2016   MCV 92.2 04/30/2016   PLT 252 04/30/2016    Recent Labs Lab 04/30/16 0257  NA 139  K 3.4*  CL 105  CO2 25  BUN 27*  CREATININE 1.78*  CALCIUM 8.9  GLUCOSE 124*   Lab Results  Component Value Date   CHOL 176 01/19/2016   HDL 33* 01/19/2016   LDLCALC 108 01/19/2016   TRIG 174* 01/19/2016   Lab Results  Component Value Date   DDIMER  11/12/2008    0.27        AT THE INHOUSE ESTABLISHED CUTOFF  VALUE OF 0.48 ug/mL FEU, THIS ASSAY HAS BEEN DOCUMENTED IN THE LITERATURE TO HAVE     Radiology/Studies  Dg Chest 2 View  04/30/2016  CLINICAL DATA:  Shortness of breath EXAM: CHEST  2 VIEW COMPARISON:  03/21/2016 FINDINGS: Chronic cardiomegaly. Interstitial coarsening with [employer]. No pleural effusion. Stable aortic and hilar contours. Prominent spondylosis for age. IMPRESSION: Mild CHF. Electronically Signed   By: Marnee Spring M.D.   On: 04/30/2016 03:30   TTE (12/15/15) - Procedure narrative: Transthoracic echocardiography. Image  quality was suboptimal. The study was technically difficult, as a  result of poor sound wave transmission and body habitus.  Intravenous contrast (Definity) was administered. - Left ventricle: The cavity size was normal. There was moderate  concentric hypertrophy. Systolic function was severely reduced.  The estimated ejection fraction was in the range of 20% to 25%.  Severe diffuse hypokinesis. There was a reduced contribution of  atrial contraction to ventricular filling, due to increased  ventricular diastolic pressure or atrial contractile dysfunction.  Doppler parameters are consistent with a reversible restrictive  pattern, indicative of decreased left ventricular diastolic  compliance and/or increased left atrial pressure (grade 3  diastolic  dysfunction). Doppler parameters are consistent with  high ventricular filling pressure. - Aortic valve: Transvalvular velocity was within the normal range.  There was no stenosis. There was no regurgitation. - Mitral valve: Transvalvular velocity was within the normal range.  There was no evidence for stenosis. There was no regurgitation. - Left atrium: The atrium was severely dilated. - Right ventricle: The cavity size was normal. Wall thickness was  normal. Systolic function was normal. - Right atrium: The atrium was moderately dilated. - Inferior vena cava: The vessel was normal in size. The  respirophasic diameter changes were blunted (< 50%), consistent  with elevated central venous pressure. - Pericardium, extracardiac: A trivial pericardial effusion was  identified.  CORONARY ANGIOGRAPHY (12/22/15)  Widely patent proximal to distal LAD. Circumflex is widely patent with 70% stenosis in the mid vessel. A branch of the first obtuse marginal is totally occluded. There is 80% stenosis in the proximal portion of the second obtuse marginal. The right coronary contains eccentric 50-60% proximal narrowing.  When the coronary anatomy is compared to the most recent angiogram from 1-1/2 years ago there is progression of stenosis in the second obtuse marginal.  Severe elevation of left ventricular end-diastolic pressure to 40 mmHg. This in conjunction with the echo EF of 25-30% suggests severe systolic heart failure and the need for more aggressive medical therapy including diuresis. Left ventricular dysfunction would appear to be out of proportion to the degree of coronary disease.  ECG - per my review, NSR with LAD, normal intervals, LVH w/ resultant repolarization abnormality, no ST or Twave changes indicative of ishcemia   ASSESSMENT AND PLAN  50 y.o. male with a history of refractory HTN, CAD, dyslipidemia, and HFrEF who presented to the ED with 2 days of worsening SOB and  orthopnea in the setting of acute decompensated heart failure.  Mr. Klocke is hemodynamically stable now, breathing comfortably on RA, and thus far responded well to IV diuretics.  I have asked the ED physicians to give all of his scheduled am anti-hypertensives now as noted below, given his significantly elevated SBP.   1) ADHF; +SOB/orthopnea, elevated BNP, evidence of elevated central venous pressure on exam.  Furosemide 80mg  IV x 1 in the ED, and then further IV dosing today for goal -1.5-2.0L.  Continue home Lisinopril and  Carvedilol.  2) HTN; baseline SBP=190s and thus SBP=220 not felt to be indicative of HTN emergency.   Have requested ED physicians to give all home anti-hypertensives first dose now including carvedilol 25mg  PO, hydralazine 100mg  PO, lisinopril 40mg  PO, and Imdur 30mg  PO  3) Renal Insufficiency; GFR appears to be at recent baseline, diuresis as per above  4) Hypokalemia; pt notes that he has not taken his prescribed KCl in several days after running out of this medication. KCl now for goal K>4.0  5) dyslipidemia; atorvastatin 80mg  PO QDAY  6) CAD; no chest pain over recent days, no ischemic changes on ECG, only mild troponin elevation in the setting of ADHF and elevated BP - ASA 81mg  PO QDAY, no indication for DAPT or systemic anticoagulation at this time   Signed, Azalee Course, MD 04/30/2016, 5:45 AM

## 2016-04-30 NOTE — Care Management Note (Signed)
Case Management Note Donn Pierini RN, BSN Unit 2W-Case Manager [phone]  Patient Details  Name: [name] MRN: [id number] Date of Birth: [birth year]  Subjective/Objective:     Pt admitted with CHF               Action/Plan: PTA pt lived at home with sig. Other- anticipate return home- CM will follow for any potential d/c needs  Expected Discharge Date:                  Expected Discharge Plan:  Home/Self Care  In-House Referral:     Discharge planning Services  CM Consult  Post Acute Care Choice:    Choice offered to:     DME Arranged:    DME Agency:     HH Arranged:    HH Agency:     Status of Service:  In process, will continue to follow  Medicare Important Message Given:    Date Medicare IM Given:    Medicare IM give by:    Date Additional Medicare IM Given:    Additional Medicare Important Message give by:     If discussed at Long Length of Stay Meetings, dates discussed:    Additional Comments:  Darrold Span, RN 04/30/2016, 2:59 PM

## 2016-04-30 NOTE — Progress Notes (Signed)
Utilization review completed.  

## 2016-04-30 NOTE — ED Provider Notes (Signed)
CSN: [id number]     Arrival date & time 04/30/16  0247 History   First MD Initiated Contact with Patient 04/30/16 [phone]     Chief Complaint  Patient presents with  . Shortness of Breath     (Consider location/radiation/quality/duration/timing/severity/associated sxs/prior Treatment) HPI Comments: Patient with a history of CHF, HTN, medication noncompliance, sleep apnea presents with complaint of SOB x 2 days. He reports symptoms are worse with exertion and worse when he lies down at night. No chest pain, vomiting, fever. He denies lower extremity edema. He reports he has been taking his medications compliantly and that his doctor has recently increased his blood pressure medications as well as doubled his Lasix all without significant improvement.   Patient is a 50 y.o. male presenting with shortness of breath. The history is provided by the patient. No language interpreter was used.  Shortness of Breath Severity:  Moderate Onset quality:  Gradual Duration:  2 days Progression:  Worsening Chronicity:  Recurrent Associated symptoms: no chest pain, no fever, no vomiting and no wheezing     Past Medical History  Diagnosis Date  . CAD (coronary artery disease)     Small vessel with occluded small OM and nonobstructive large vessel disease (cath 02/2014)  . Obesity   . History of syncope 08/2008    Secondary to hypertension  . Tobacco abuse   . Hypertension, essential, benign     a. renal art [place] (6/14): no RAS  . Noncompliance   . CKD (chronic kidney disease)   . Nonischemic cardiomyopathy (HCC)     EF 45%  . Sleep apnea   . CHF (congestive heart failure) [facility])    Past Surgical History  Procedure Laterality Date  . None    . Left heart catheterization with coronary angiogram N/A 02/21/2014    Procedure: LEFT HEART CATHETERIZATION WITH CORONARY ANGIOGRAM;  Surgeon: Kathleene Hazel, MD;  Location: [facility] CATH LAB;  Service: Cardiovascular;  Laterality: N/A;  . Cardiac  catheterization N/A 12/22/2015    Procedure: Left Heart Cath and Coronary Angiography;  Surgeon: Lyn Records, MD;  Location: [facility] INVASIVE CV LAB;  Service: Cardiovascular;  Laterality: N/A;   Family History  Problem Relation Age of Onset  . Lupus Mother   . Heart disease Maternal Grandfather   . Coronary artery disease Neg Hx    Social History  Substance Use Topics  . Smoking status: Former Smoker -- 0.10 packs/day for 32 years    Types: Cigarettes  . Smokeless tobacco: None     Comment: Started smoking at age 77 (49).  Cutting back  . Alcohol Use: No    Review of Systems  Constitutional: Negative for fever and chills.  Respiratory: Positive for shortness of breath. Negative for wheezing.   Cardiovascular: Negative.  Negative for chest pain and leg swelling.  Gastrointestinal: Positive for abdominal distention. Negative for vomiting.  Musculoskeletal: Negative.  Negative for myalgias.  Skin: Negative.  Negative for color change.  Neurological: Negative.       Allergies  Penicillins and Shrimp  Home Medications   Prior to Admission medications   Medication Sig Start Date End Date Taking? Authorizing Provider  amLODipine (NORVASC) 10 MG tablet Take 1 tablet (10 mg total) by mouth daily. 12/23/15   Costin Otelia Sergeant, MD  aspirin EC 81 MG tablet Take 1 tablet (81 mg total) by mouth daily. 12/23/15   Costin Otelia Sergeant, MD  atorvastatin (LIPITOR) 80 MG tablet Take 1 tablet (80 mg total)  by mouth daily at 6 PM. 12/23/15   Leatha Gilding, MD  carvedilol (COREG) 25 MG tablet Take 1 tablet (25 mg total) by mouth 2 (two) times daily with a meal. 04/08/16   Lewayne Bunting, MD  furosemide (LASIX) 40 MG tablet Take 2 tablets (80 mg total) by mouth daily. 04/08/16   Lewayne Bunting, MD  hydrALAZINE (APRESOLINE) 100 MG tablet Take 1 tablet (100 mg total) by mouth 3 (three) times daily. 04/08/16   Lewayne Bunting, MD  isosorbide mononitrate (IMDUR) 30 MG 24 hr tablet Take 1 tablet (30 mg  total) by mouth daily. 04/08/16   Lewayne Bunting, MD  lisinopril (PRINIVIL,ZESTRIL) 40 MG tablet Take 1 tablet (40 mg total) by mouth daily. 12/23/15   Costin Otelia Sergeant, MD  Multiple Vitamin (MULTIVITAMIN WITH MINERALS) TABS tablet Take 1 tablet by mouth daily.    Historical Provider, MD  nitroGLYCERIN (NITROSTAT) 0.4 MG SL tablet Place 1 tablet (0.4 mg total) under the tongue every 5 (five) minutes as needed. For chest pain 12/23/15   Leatha Gilding, MD  potassium chloride SA (K-DUR,KLOR-CON) 20 MEQ tablet Take 1 tablet (20 mEq total) by mouth 2 (two) times daily. 12/23/15   Costin Otelia Sergeant, MD   BP 209/129 mmHg  Pulse 77  Temp(Src) 97.9 F (36.6 C) (Oral)  Resp 30  SpO2 98% Physical Exam  Constitutional: He is oriented to person, place, and time. He appears well-developed and well-nourished.  HENT:  Head: Normocephalic.  Neck: Normal range of motion. Neck supple.  Cardiovascular: Normal rate and regular rhythm.   No murmur heard. Pulmonary/Chest: Effort normal and breath sounds normal. He exhibits no tenderness.  Prolonged expirations. There are significant rales bilaterally, worse at the bilateral bases. Shortened respirations.  Abdominal: Soft. Bowel sounds are normal. He exhibits distension. There is no tenderness. There is no rebound and no guarding.  Musculoskeletal: Normal range of motion. He exhibits no edema.  Neurological: He is alert and oriented to person, place, and time.  Skin: Skin is warm and dry. No rash noted.  Psychiatric: He has a normal mood and affect.    ED Course  Procedures (including critical care time) Labs Review Labs Reviewed  BASIC METABOLIC PANEL - Abnormal; Notable for the following:    Potassium 3.4 (*)    Glucose, Bld 124 (*)    BUN 27 (*)    Creatinine, Ser 1.78 (*)    GFR calc non Af Amer 43 (*)    GFR calc Af Amer 50 (*)    All other components within normal limits  TROPONIN I - Abnormal; Notable for the following:    Troponin I 0.08 (*)     All other components within normal limits  CBC  BRAIN NATRIURETIC PEPTIDE    Imaging Review Dg Chest 2 View  04/30/2016  CLINICAL DATA:  Shortness of breath EXAM: CHEST  2 VIEW COMPARISON:  03/21/2016 FINDINGS: Chronic cardiomegaly. Interstitial coarsening with [employer]. No pleural effusion. Stable aortic and hilar contours. Prominent spondylosis for age. IMPRESSION: Mild CHF. Electronically Signed   By: Marnee Spring M.D.   On: 04/30/2016 03:30   I have personally reviewed and evaluated these images and lab results as part of my medical decision-making.   EKG Interpretation   Date/Time:  Friday April 30 2016 02:52:01 EDT Ventricular Rate:  85 PR Interval:  156 QRS Duration: 96 QT Interval:  414 QTC Calculation: 492 R Axis:   2 Text Interpretation:  Normal  sinus rhythm Biatrial enlargement Left  ventricular hypertrophy Nonspecific T wave abnormality Prolonged QT No  significant change since last tracing Confirmed by Anitra Lauth  MD, Alphonzo Lemmings  [phone]) on 04/30/2016 4:06:30 AM      MDM   Final diagnoses:  None    1. CHF exacerbation  The patient presents with progressive SOB w/orthopnea similar to previous episodes of CHF exacerbation. No peripheral edema. No fever. Mild CHF on CXR, clinically he presents as CHF. Blood pressure concerningly high. NTG given for pressure as well as for treatment of CHF, as well as IV lasix.   Discussed with cardiology. PO medications per patient's regular dosing was recommended and ordered. Cardiology to see and admit.   Elpidio Anis, PA-C 05/01/16 9935  Gwyneth Sprout, MD 05/01/16 [phone]

## 2016-05-01 ENCOUNTER — Inpatient Hospital Stay (HOSPITAL_COMMUNITY): Payer: MEDICAID

## 2016-05-01 ENCOUNTER — Encounter (HOSPITAL_COMMUNITY): Payer: Self-pay | Admitting: Cardiology

## 2016-05-01 DIAGNOSIS — I2583 Coronary atherosclerosis due to lipid rich plaque: Secondary | ICD-10-CM

## 2016-05-01 DIAGNOSIS — I160 Hypertensive urgency: Secondary | ICD-10-CM

## 2016-05-01 DIAGNOSIS — N183 Chronic kidney disease, stage 3 (moderate): Secondary | ICD-10-CM

## 2016-05-01 DIAGNOSIS — I509 Heart failure, unspecified: Secondary | ICD-10-CM

## 2016-05-01 DIAGNOSIS — I2510 Atherosclerotic heart disease of native coronary artery without angina pectoris: Secondary | ICD-10-CM

## 2016-05-01 DIAGNOSIS — I5043 Acute on chronic combined systolic (congestive) and diastolic (congestive) heart failure: Secondary | ICD-10-CM

## 2016-05-01 LAB — BASIC METABOLIC PANEL
ANION GAP: 10 (ref 5–15)
BUN: 23 mg/dL — ABNORMAL HIGH (ref 6–20)
CHLORIDE: 100 mmol/L — AB (ref 101–111)
CO2: 29 mmol/L (ref 22–32)
Calcium: 9 mg/dL (ref 8.9–10.3)
Creatinine, Ser: 1.84 mg/dL — ABNORMAL HIGH (ref 0.61–1.24)
GFR, EST AFRICAN AMERICAN: 48 mL/min — AB (ref >60)
GFR, EST NON AFRICAN AMERICAN: 41 mL/min — AB (ref >60)
GLUCOSE: 117 mg/dL — AB (ref 65–99)
POTASSIUM: 3.5 mmol/L (ref 3.5–5.1)
SODIUM: 139 mmol/L (ref 135–145)

## 2016-05-01 LAB — ECHOCARDIOGRAM COMPLETE
CHL CUP MV DEC (S): 158
E decel time: 158 msec
E/e' ratio: 18.38
FS: 16 % — AB (ref 28–44)
IVS/LV PW RATIO, ED: 0.99
LA vol index: 35.1 mL/m2
LADIAMINDEX: 2 cm/m2
LASIZE: 43 mm
LAVOL: 75.5 mL
LAVOLA4C: 55.6 mL
LDCA: 3.46 cm2
LEFT ATRIUM END SYS DIAM: 43 mm
LV E/e'average: 18.38
LV PW d: 12.7 mm — AB (ref 0.6–1.1)
LVEEMED: 18.38
LVELAT: 5.55 cm/s
LVOT diameter: 21 mm
Lateral S' vel: 12.6 cm/s
MV Peak grad: 4 mmHg
MV pk E vel: 102 m/s
MVPKAVEL: 73.7 m/s
TAPSE: 20.8 mm
TDI e' lateral: 5.55
TDI e' medial: 5.77
WEIGHTICAEL: 3348.8 [oz_av]

## 2016-05-01 MED ORDER — OXYCODONE-ACETAMINOPHEN 5-325 MG PO TABS
1.0000 | ORAL_TABLET | Freq: Four times a day (QID) | ORAL | Status: AC | PRN
  Administered 2016-05-01 – 2016-05-02 (×2): 1 via ORAL
  Filled 2016-05-01 (×2): qty 1

## 2016-05-01 MED ORDER — AMLODIPINE BESYLATE 10 MG PO TABS
10.0000 mg | ORAL_TABLET | Freq: Every day | ORAL | Status: DC
  Administered 2016-05-02 – 2016-05-03 (×2): 10 mg via ORAL
  Filled 2016-05-01 (×2): qty 1

## 2016-05-01 MED ORDER — AMLODIPINE BESYLATE 5 MG PO TABS
5.0000 mg | ORAL_TABLET | Freq: Once | ORAL | Status: AC
  Administered 2016-05-01: 5 mg via ORAL
  Filled 2016-05-01: qty 1

## 2016-05-01 MED ORDER — PERFLUTREN LIPID MICROSPHERE
1.0000 mL | INTRAVENOUS | Status: AC | PRN
  Administered 2016-05-01: 4 mL via INTRAVENOUS
  Filled 2016-05-01: qty 10

## 2016-05-01 NOTE — Progress Notes (Addendum)
Patient resting on right side had short run nsvt 5 bts then back S.R. 60

## 2016-05-01 NOTE — Progress Notes (Signed)
While sleeping tonight patient h.r. Drop to 37 for a few beats then back up to 60

## 2016-05-01 NOTE — Progress Notes (Addendum)
SUBJECTIVE:  Feeling much better.  SOB has resolved.  No LE edema.    OBJECTIVE:   Vitals:   Filed Vitals:   04/30/16 1700 04/30/16 2103 04/30/16 2355 05/01/16 0542  BP: 171/91 180/103 158/79 187/111  Pulse: 83 68 66 73  Temp:  98.2 F (36.8 C)  98.4 F (36.9 C)  TempSrc:  Oral  Oral  Resp:  18  20  Weight:    209 lb 4.8 oz (94.938 kg)  SpO2:  95%  97%   I&O's:   Intake/Output Summary (Last 24 hours) at 05/01/16 1053 Last data filed at 04/30/16 2200  Gross per 24 hour  Intake    600 ml  Output      0 ml  Net    600 ml   TELEMETRY: Reviewed telemetry pt in NSR with 6 beat run of WCT:     PHYSICAL EXAM General: Well developed, well nourished, in no acute distress Head: Eyes PERRLA, No xanthomas.   Normal cephalic and atramatic  Lungs:   Clear bilaterally to auscultation and percussion. Heart:   HRRR S1 S2 Pulses are 2+ & equal. Abdomen: Bowel sounds are positive, abdomen soft and non-tender without masses  Msk:  Back normal, normal gait. Normal strength and tone for age. Extremities:   No clubbing, cyanosis or edema.  DP +1 Neuro: Alert and oriented X 3. Psych:  Good affect, responds appropriately   LABS: Basic Metabolic Panel:  Recent Labs  94/85/46 0257 04/30/16 1015 05/01/16 0406  NA 139  --  139  K 3.4*  --  3.5  CL 105  --  100*  CO2 25  --  29  GLUCOSE 124*  --  117*  BUN 27*  --  23*  CREATININE 1.78* 1.76* 1.84*  CALCIUM 8.9  --  9.0   Liver Function Tests: No results for input(s): AST, ALT, ALKPHOS, BILITOT, PROT, ALBUMIN in the last 72 hours. No results for input(s): LIPASE, AMYLASE in the last 72 hours. CBC:  Recent Labs  04/30/16 0257 04/30/16 1015  WBC 6.5 8.7  HGB 13.3 13.7  HCT 40.4 41.3  MCV 92.2 92.4  PLT 252 261   Cardiac Enzymes:  Recent Labs  04/30/16 0257  TROPONINI 0.08*   BNP: Invalid input(s): POCBNP D-Dimer: No results for input(s): DDIMER in the last 72 hours. Hemoglobin A1C: No results for input(s):  HGBA1C in the last 72 hours. Fasting Lipid Panel: No results for input(s): CHOL, HDL, LDLCALC, TRIG, CHOLHDL, LDLDIRECT in the last 72 hours. Thyroid Function Tests: No results for input(s): TSH, T4TOTAL, T3FREE, THYROIDAB in the last 72 hours.  Invalid input(s): FREET3 Anemia Panel: No results for input(s): VITAMINB12, FOLATE, FERRITIN, TIBC, IRON, RETICCTPCT in the last 72 hours. Coag Panel:   Lab Results  Component Value Date   INR 1.09 12/22/2015   INR >10.00* 12/22/2015   INR 1.1* 02/18/2014    RADIOLOGY: Dg Chest 2 View  04/30/2016  CLINICAL DATA:  Shortness of breath EXAM: CHEST  2 VIEW COMPARISON:  03/21/2016 FINDINGS: Chronic cardiomegaly. Interstitial coarsening with [employer]. No pleural effusion. Stable aortic and hilar contours. Prominent spondylosis for age. IMPRESSION: Mild CHF. Electronically Signed   By: Marnee Spring M.D.   On: 04/30/2016 03:30    ASSESSMENT AND PLAN  50 y.o. male with a history of refractory HTN, CAD, dyslipidemia, and HFrEF who presented to the ED with 2 days of worsening SOB and orthopnea in the setting of acute decompensated heart failure.  1) Acute on chronic combined systolic/diastolic CHF: I suspect this exacerbation is due to poorly controlled BP at home. He says that his SBP runs 180's at home.  He admits to being compliant with meds at home and has only missed his potassium.  He denies any added salt in his diet and follows a low sodium diet.   I&O's are incomplete. Clinically on exam appears euvolemic.  His lungs are clear and no LE edema.  Strict I&O's and daily weight.  Continue home Lisinopril and Carvedilol.  Will change back to PO Lasix since his creatinine has bumped.  2) HTN:  BP poorly controlled at home despite compliance with meds.  He had a renal duplex in 2014 with no evidence of renal artery stenosis.  Check TSH.  BP remains elevated.  Started on amlodipine  yesterday.  Will increase amlodipine to  daily.  Continue  Coreg  BID, hydralazine  TID and Lisinopril  daily.    3) Renal Insufficiency:  Creatinine bumped so will change back to PO Lasix.  4) Hypokalemia; repleated.  Follow closely.  5) dyslipidemia; atorvastatin  PO QDAY  6) CAD: cath in January with 90% OM1, occluded ramus, 50% LAD, 70% LCx, 80% distal LAD, 65% prox to mid RCA on medical management due to CKD.   He denies any chest pain. No ischemic changes on ECG, only mild troponin elevation in the setting of Acute on CHF and elevated BP - Continue ASA/statin/BB  7)  Nonsustained ventricular tachycardia - his last EF assessment was 11/2015 with EF 20-25%.  In setting of ischemic CM and NSVT on maximum medical therapy, will repeat echo and if EF still < 35% will need EP eval for AICD placement for primary prevention.  I have spent a total of 35 minutes with patient reviewing prior hospital notes , telemetry, EKGs, labs, echo, prior cath report and examining patient as well as establishing an assessment and plan that was discussed with the patient.  > 50% of time was spent in direct patient care.    Armanda Magic, MD  05/01/2016  10:53 AM

## 2016-05-01 NOTE — Progress Notes (Signed)
  Echocardiogram 2D Echocardiogram has been performed.  Delcie Roch 05/01/2016, 4:12 PM

## 2016-05-02 LAB — BASIC METABOLIC PANEL
ANION GAP: 8 (ref 5–15)
Anion gap: 10 (ref 5–15)
BUN: 29 mg/dL — AB (ref 6–20)
BUN: 26 mg/dL — AB (ref 6–20)
CO2: 27 mmol/L (ref 22–32)
CO2: 28 mmol/L (ref 22–32)
Calcium: 9 mg/dL (ref 8.9–10.3)
Calcium: 9 mg/dL (ref 8.9–10.3)
Chloride: 101 mmol/L (ref 101–111)
Chloride: 101 mmol/L (ref 101–111)
Creatinine, Ser: 1.9 mg/dL — ABNORMAL HIGH (ref 0.61–1.24)
Creatinine, Ser: 1.86 mg/dL — ABNORMAL HIGH (ref 0.61–1.24)
GFR calc Af Amer: 46 mL/min — ABNORMAL LOW (ref >60)
GFR calc Af Amer: 47 mL/min — ABNORMAL LOW (ref >60)
GFR, EST NON AFRICAN AMERICAN: 40 mL/min — AB (ref >60)
GFR, EST NON AFRICAN AMERICAN: 41 mL/min — AB (ref >60)
GLUCOSE: 106 mg/dL — AB (ref 65–99)
GLUCOSE: 114 mg/dL — AB (ref 65–99)
POTASSIUM: 2.9 mmol/L — AB (ref 3.5–5.1)
POTASSIUM: 3.6 mmol/L (ref 3.5–5.1)
Sodium: 138 mmol/L (ref 135–145)
Sodium: 137 mmol/L (ref 135–145)

## 2016-05-02 MED ORDER — POTASSIUM CHLORIDE CRYS ER 20 MEQ PO TBCR
40.0000 meq | EXTENDED_RELEASE_TABLET | Freq: Once | ORAL | Status: AC
  Administered 2016-05-02: 40 meq via ORAL
  Filled 2016-05-02: qty 2

## 2016-05-02 MED ORDER — POTASSIUM CHLORIDE CRYS ER 20 MEQ PO TBCR
20.0000 meq | EXTENDED_RELEASE_TABLET | Freq: Two times a day (BID) | ORAL | Status: DC
  Administered 2016-05-02 – 2016-05-03 (×3): 20 meq via ORAL
  Filled 2016-05-02 (×3): qty 1

## 2016-05-02 MED ORDER — CARVEDILOL 12.5 MG PO TABS
12.5000 mg | ORAL_TABLET | Freq: Two times a day (BID) | ORAL | Status: DC
  Administered 2016-05-02 – 2016-05-03 (×2): 12.5 mg via ORAL
  Filled 2016-05-02 (×2): qty 1

## 2016-05-02 MED ORDER — FUROSEMIDE 40 MG PO TABS
40.0000 mg | ORAL_TABLET | Freq: Two times a day (BID) | ORAL | Status: DC
  Administered 2016-05-02 – 2016-05-03 (×3): 40 mg via ORAL
  Filled 2016-05-02 (×3): qty 1

## 2016-05-02 MED ORDER — HYDRALAZINE HCL 50 MG PO TABS
100.0000 mg | ORAL_TABLET | Freq: Four times a day (QID) | ORAL | Status: DC
  Administered 2016-05-02 – 2016-05-03 (×4): 100 mg via ORAL
  Filled 2016-05-02 (×4): qty 2

## 2016-05-02 NOTE — Progress Notes (Signed)
Pt reports he vomited in the bathroom once - Zofran 4mg  IV given per prn orders - pt advised to notify RN if he vomits again for thorough evaluation.

## 2016-05-02 NOTE — Progress Notes (Signed)
Pt reports a decrease in headache intensity, now rating 3/10 after Percocet. Heart rate noted to be 45-50 (sinus brady) - morning labs evaluated at noted a Potassium level of 2.9 obtained around 0230 - contacted provider on call for Corotto for replacement and concern of current Lasix 80mg  IV due at 0800, orders provided by Tereso Newcomer, PA to administer a one time dose of Potassium 40 mEq PO, hold Lasix 80mg  IV administration for approximately one hour after Potassium given, hold Coreg due to heart rate at this time, recheck potassium this evening and in the morning, and will add Potassium 20 mEq PO BID.

## 2016-05-02 NOTE — Progress Notes (Signed)
  05/02/2016 8:08 AM  Called by RN. K+ 2.9. Patient with HR in 40s. Give K+ 40 mEq now. Start K+ 20 mEq bid Hold Coreg until seen on rounds. BMET this PM at 2:00 and repeat in AM. Tereso Newcomer, PA-C   05/02/2016 8:09 AM

## 2016-05-02 NOTE — Progress Notes (Signed)
SUBJECTIVE:  No complaints  OBJECTIVE:   Vitals:   Filed Vitals:   05/01/16 2023 05/02/16 0452 05/02/16 0551 05/02/16 0732  BP: 151/75 173/86 178/89 158/82  Pulse: 65 63    Temp: 98.2 F (36.8 C) 98.3 F (36.8 C)    TempSrc: Oral Oral    Resp: 18 18    Height:      Weight:  208 lb 5.4 oz (94.5 kg)    SpO2: 100% 100%     I&O's:   Intake/Output Summary (Last 24 hours) at 05/02/16 0942 Last data filed at 05/02/16 0900  Gross per 24 hour  Intake    240 ml  Output      0 ml  Net    240 ml   TELEMETRY: Reviewed telemetry pt in NSR:     PHYSICAL EXAM General: Well developed, well nourished, in no acute distress Head: Eyes PERRLA, No xanthomas.   Normal cephalic and atramatic  Lungs:   Clear bilaterally to auscultation and percussion. Heart:   HRRR S1 S2 Pulses are 2+ & equal.            No carotid bruit. No JVD.  No abdominal bruits. No femoral bruits. Abdomen: Bowel sounds are positive, abdomen soft and non-tender without masses or                  Hernia's noted. Msk:  Back normal, normal gait. Normal strength and tone for age. Extremities:   No clubbing, cyanosis or edema.  DP +1 Neuro: Alert and oriented X 3. Psych:  Good affect, responds appropriately   LABS: Basic Metabolic Panel:  Recent Labs  16/10/96 0406 05/02/16 0246  NA 139 138  K 3.5 2.9*  CL 100* 101  CO2 29 27  GLUCOSE 117* 106*  BUN 23* 29*  CREATININE 1.84* 1.90*  CALCIUM 9.0 9.0   Liver Function Tests: No results for input(s): AST, ALT, ALKPHOS, BILITOT, PROT, ALBUMIN in the last 72 hours. No results for input(s): LIPASE, AMYLASE in the last 72 hours. CBC:  Recent Labs  04/30/16 0257 04/30/16 1015  WBC 6.5 8.7  HGB 13.3 13.7  HCT 40.4 41.3  MCV 92.2 92.4  PLT 252 261   Cardiac Enzymes:  Recent Labs  04/30/16 0257  TROPONINI 0.08*   BNP: Invalid input(s): POCBNP D-Dimer: No results for input(s): DDIMER in the last 72 hours. Hemoglobin A1C: No results for input(s):  HGBA1C in the last 72 hours. Fasting Lipid Panel: No results for input(s): CHOL, HDL, LDLCALC, TRIG, CHOLHDL, LDLDIRECT in the last 72 hours. Thyroid Function Tests: No results for input(s): TSH, T4TOTAL, T3FREE, THYROIDAB in the last 72 hours.  Invalid input(s): FREET3 Anemia Panel: No results for input(s): VITAMINB12, FOLATE, FERRITIN, TIBC, IRON, RETICCTPCT in the last 72 hours. Coag Panel:   Lab Results  Component Value Date   INR 1.09 12/22/2015   INR >10.00* 12/22/2015   INR 1.1* 02/18/2014    RADIOLOGY: Dg Chest 2 View  04/30/2016  CLINICAL DATA:  Shortness of breath EXAM: CHEST  2 VIEW COMPARISON:  03/21/2016 FINDINGS: Chronic cardiomegaly. Interstitial coarsening with [employer]. No pleural effusion. Stable aortic and hilar contours. Prominent spondylosis for age. IMPRESSION: Mild CHF. Electronically Signed   By: Marnee Spring M.D.   On: 04/30/2016 03:30    ASSESSMENT AND PLAN  50 y.o. male with a history of refractory HTN, CAD, dyslipidemia, and HFrEF who presented to the ED with 2 days of worsening SOB and orthopnea in the  setting of acute decompensated heart failure.  1) Acute on chronic combined systolic/diastolic CHF: I suspect this exacerbation is due to poorly controlled BP at home. He says that his SBP runs 180's at home. He admits to being compliant with meds at home and has only missed his potassium. He denies any added salt in his diet and follows a low sodium diet. I&O's are incomplete. Clinically on exam appears euvolemic. His lungs are clear and no LE edema. Strict I&O's and daily weight. Continue home Lisinopril dose. Creatinine bumped yesterday and again today so will change Lasix  back to PO.  2) HTN: BP better but still not well controlled after increasing amlodipine to 10mg  daily. He had a renal duplex in 2014 with no evidence of renal artery stenosis. Check TSH.  HR in the 40's early this am and Coreg held.  Will decrease Coreg to 12.5mg  BID  starting this afternoon.  Increase hydralazine to 100mg  QID and continue Lisinopril 40mg  daily. May need to add Clonidine.  3) Renal Insufficiency: Creatinine slightly increased from yesterday (1.78-->1.84-->1.9).  Continue to follow.  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  Might consider renal consult.  Change lasix back to PO.  4) Hypokalemia: repleat. Follow closely.  5) Dyslipidemia: Atorvastatin 80mg  daily  6) CAD: cath in January with 90% OM1, occluded ramus, 50% LAD, 70% LCx, 80% distal LAD, 65% prox to mid RCA on medical management due to CKD. He denies any chest pain. No ischemic changes on ECG, only mild troponin elevation in the setting of Acute on CHF and elevated BP - Continue ASA/statin/BB  7) Nonsustained ventricular tachycardia - his last EF assessment was 11/2015 with EF 20-25%.Repeat echo yesterday showed EF 35-40% so no indication for AICD for primary prevention at this time.  Continue BB and keep K > 4.   Armanda Magic, MD  05/02/2016  9:42 AM

## 2016-05-03 ENCOUNTER — Other Ambulatory Visit: Payer: Self-pay | Admitting: Cardiology

## 2016-05-03 ENCOUNTER — Encounter (HOSPITAL_COMMUNITY): Payer: Self-pay | Admitting: Cardiology

## 2016-05-03 ENCOUNTER — Encounter (HOSPITAL_COMMUNITY): Payer: Self-pay

## 2016-05-03 ENCOUNTER — Telehealth: Payer: Self-pay | Admitting: Nurse Practitioner

## 2016-05-03 DIAGNOSIS — N183 Chronic kidney disease, stage 3 (moderate): Secondary | ICD-10-CM

## 2016-05-03 DIAGNOSIS — E876 Hypokalemia: Secondary | ICD-10-CM

## 2016-05-03 DIAGNOSIS — I428 Other cardiomyopathies: Secondary | ICD-10-CM

## 2016-05-03 DIAGNOSIS — R001 Bradycardia, unspecified: Secondary | ICD-10-CM

## 2016-05-03 HISTORY — DX: Other cardiomyopathies: I42.8

## 2016-05-03 HISTORY — DX: Hypokalemia: E87.6

## 2016-05-03 LAB — BASIC METABOLIC PANEL
Anion gap: 11 (ref 5–15)
BUN: 26 mg/dL — ABNORMAL HIGH (ref 6–20)
CO2: 26 mmol/L (ref 22–32)
Calcium: 8.8 mg/dL — ABNORMAL LOW (ref 8.9–10.3)
Chloride: 100 mmol/L — ABNORMAL LOW (ref 101–111)
Creatinine, Ser: 2.01 mg/dL — ABNORMAL HIGH (ref 0.61–1.24)
GFR calc Af Amer: 43 mL/min — ABNORMAL LOW (ref >60)
GFR calc non Af Amer: 37 mL/min — ABNORMAL LOW (ref >60)
Glucose, Bld: 98 mg/dL (ref 65–99)
Potassium: 3.7 mmol/L (ref 3.5–5.1)
Sodium: 137 mmol/L (ref 135–145)

## 2016-05-03 MED ORDER — HYDRALAZINE HCL 100 MG PO TABS: 100.0000 mg | ORAL_TABLET | Freq: Four times a day (QID) | ORAL | Status: DC

## 2016-05-03 MED ORDER — AMLODIPINE BESYLATE 10 MG PO TABS: 10.0000 mg | ORAL_TABLET | Freq: Every day | ORAL | Status: DC

## 2016-05-03 MED ORDER — CARVEDILOL 12.5 MG PO TABS: 12.5000 mg | ORAL_TABLET | Freq: Two times a day (BID) | ORAL | Status: DC

## 2016-05-03 MED ORDER — POTASSIUM CHLORIDE ER 20 MEQ PO TBCR: 60.0000 meq | EXTENDED_RELEASE_TABLET | Freq: Every day | ORAL | Status: DC

## 2016-05-03 MED ORDER — POTASSIUM CHLORIDE CRYS ER 20 MEQ PO TBCR
20.0000 meq | EXTENDED_RELEASE_TABLET | Freq: Once | ORAL | Status: AC
  Administered 2016-05-03: 20 meq via ORAL
  Filled 2016-05-03: qty 1

## 2016-05-03 NOTE — Telephone Encounter (Signed)
New message      TCM appt on 05-10-16 with Ledell Peoples per Nada Boozer.

## 2016-05-03 NOTE — Discharge Summary (Signed)
Physician Discharge Summary       Patient ID: [name] MRN: [id number] DOB/AGE: 1966-06-23 50 y.o.  Admit date: 04/30/2016 Discharge date: 05/03/2016 Primary Cardiologist:Dr. [name]   Discharge Diagnoses:  Principal Problem:   Acute on chronic combined systolic and diastolic congestive heart failure, NYHA class 3 (HCC) Active Problems:   HYPERTENSION, BENIGN   Coronary atherosclerosis due to lipid rich plaque   Accelerated hypertension   Hypertensive urgency   CKD (chronic kidney disease), stage III   Hypokalemia   Cardiomyopathy, nonischemic (HCC)   Discharged Condition: good  Procedures: no cardiac cath  ECHO 05/01/16 ------------------------------------------------------------------- LV EF: 35% - 40%  ------------------------------------------------------------------- Indications: CHF - 428.0.  ------------------------------------------------------------------- History: Risk factors: Obstructive sleep apnea. Chronic kidney disease. Hypertension.  ------------------------------------------------------------------- Study Conclusions  - Left ventricle: The cavity size was moderately dilated. Wall  thickness was increased in a pattern of moderate LVH. There was  mild concentric hypertrophy. Systolic function was moderately  reduced. The estimated ejection fraction was in the range of 35%  to 40%. Diffuse hypokinesis. Doppler parameters are consistent  with both elevated ventricular end-diastolic filling pressure and  elevated left atrial filling pressure. - Left atrium: The atrium was mildly dilated. - Atrial septum: No defect or patent foramen ovale was identified.  Hospital Course: 50 y.o. male with a history of refractory HTN, CAD, dyslipidemia, and HFrEF who presented to the ED  04/30/16 with 2 days of worsening SOB and orthopnea. The pt reported that 2 days prior to admission he noted increased dyspnea with moderate exertion, and  progressed over the 48H prior to admit to SOB at rest. He also c/o orthopnea, but denies PND. He has not noted increased LE edema but has noted increased abdominal girth. He denied any recent illness, dietary indiscretions, or missed medication doses including his diuretic. He has been adherent to his entire prescribed medication regimen. After arrival to the ED, the pt  remained hemodynamically stable and was saturating well on RA, although he continued to experience dyspnea. Given concern for ADHF with elevated BNP, as well as HTN urgency, the ED physicians have requested cardiology admission.  Pt haas been diuresed but is +1800 though unsure if correct since admit.  His BP has improved from 223/131 on admit- but still elevated.  Would like to change to BiDil to improve compliance but dose is very elevated.  His Echo does have improved EF now 35-40% up from 20-25% in Jan/2017.  He has hx of normal renal arteries on duplex doppler 2014.  His EF has improved, no need for ICD. He did have bradycardia to 37, S. Brady and coreg decreased to 12.5 mg BID.   Troponin mildly elevated with HTN.  His K+ has been difficult to replace so going home on higher dose and check labs tomorrow.     He is on ACE, BB, diuretic.     Pt seen and evaluated by Dr. Antoine Poche and found stable for discharge though with close follow up. Discussed setting alarm on phone to take meds.  He tells me he will take.    Needs BMP tomorrow and follow up in 7 days.  Will need to adjust meds as needed, currently on lower dose of BB for [facility] and higher dose of hydralazine. NEEDS OUTPATIENT SPLIT SLEEP STUDY- please arrange on visit.  Previously dx. But could not afford the CPAP.  Now he can.     Consults: None  Significant Diagnostic Studies:  BMP Latest Ref Rng 05/03/2016 05/02/2016 05/02/2016  Glucose 65 - 99 mg/dL 98 270(W) 237(S)  BUN 6 - 20 mg/dL 28(B) 15(V) 76(H)  Creatinine 0.61 - 1.24 mg/dL 6.07(P) 7.10(G) 2.69(S)  Sodium  135 - 145 mmol/L 137 137 138  Potassium 3.5 - 5.1 mmol/L 3.7 3.6 2.9(L)  Chloride 101 - 111 mmol/L 100(L) 101 101  CO2 22 - 32 mmol/L 26 28 27   Calcium 8.9 - 10.3 mg/dL 8.5(I) 9.0 9.0   CBC Latest Ref Rng 04/30/2016 04/30/2016 03/21/2016  WBC 4.0 - 10.5 K/uL 8.7 6.5 6.5  Hemoglobin 13.0 - 17.0 g/dL 62.7 03.5 00.9  Hematocrit 39.0 - 52.0 % 41.3 40.4 42.1  Platelets 150 - 400 K/uL 261 252 265   Troponin 0.08  BNP    Component Value Date/Time   BNP 1257.6* 04/30/2016 0257    CHEST 2 VIEW  COMPARISON: 03/21/2016  FINDINGS: Chronic cardiomegaly. Interstitial coarsening with [employer]. No pleural effusion. Stable aortic and hilar contours. Prominent spondylosis for age.  IMPRESSION: Mild CHF.      Discharge Exam: Blood pressure 159/95, pulse 64, temperature 98.4 F (36.9 C), temperature source Oral, resp. rate 16, height 5\' 7"  (1.702 m), weight 212 lb 1.6 oz (96.208 kg), SpO2 100 %.  Disposition: 01-Home or Self Care     Medication List    STOP taking these medications        potassium chloride SA 20 MEQ tablet  Commonly known as:  K-DUR,KLOR-CON      TAKE these medications        amLODipine 10 MG tablet  Commonly known as:  NORVASC  Take 1 tablet (10 mg total) by mouth daily.     aspirin EC 81 MG tablet  Take 1 tablet (81 mg total) by mouth daily.     atorvastatin 80 MG tablet  Commonly known as:  LIPITOR  Take 1 tablet (80 mg total) by mouth daily at 6 PM.     carvedilol 12.5 MG tablet  Commonly known as:  COREG  Take 1 tablet (12.5 mg total) by mouth 2 (two) times daily with a meal.     furosemide 40 MG tablet  Commonly known as:  LASIX  Take 2 tablets (80 mg total) by mouth daily.     hydrALAZINE 100 MG tablet  Commonly known as:  APRESOLINE  Take 1 tablet (100 mg total) by mouth every 6 (six) hours.     isosorbide mononitrate 30 MG 24 hr tablet  Commonly known as:  IMDUR  Take 1 tablet (30 mg total) by mouth daily.     lisinopril 40 MG  tablet  Commonly known as:  PRINIVIL,ZESTRIL  Take 1 tablet (40 mg total) by mouth daily.     multivitamin with minerals Tabs tablet  Take 1 tablet by mouth daily.     nitroGLYCERIN 0.4 MG SL tablet  Commonly known as:  NITROSTAT  Place 1 tablet (0.4 mg total) under the tongue every 5 (five) minutes as needed. For chest pain     Potassium Chloride ER 20 MEQ Tbcr  Take 60 mEq by mouth daily.       Follow-up Information    Follow up with Norma Fredrickson, NP On 05/10/2016.   Specialties:  Nurse Practitioner, Interventional Cardiology, Cardiology, Radiology   Why:  at 9:00 AM with Dr. Ludwig Clarks Nurse Prectitioner- [facility] information:   [address] [place] [address] [phone]        Discharge Instructions: You need lab work in our office  tomorrow the 13th very important.  Need to re-evaluate your potassium.  Have blood work done on 3rd floor of our [facility].   Weigh daily Call [phone] if weight climbs more than 3 pounds in a day or 5 pounds in a week. No salt to very little salt in your diet.  No more than 2000 mg in a day. Call if increased shortness of breath or increased swelling.  Heart Healthy Low salt diet, no fast foods.    We adjusted your medications.  Follow directions.  Please keep follow up appointment.    Signed: Nada Boozer Nurse Practitioner-Certified [facility]: [facility] 05/03/2016, 10:59 AM  Time spent on discharge : > 30 minutes.    Patient seen and examined.  Plan as discussed in my rounding note for today and outlined above. Fayrene Fearing [facility]  05/03/2016  11:41 AM

## 2016-05-03 NOTE — Progress Notes (Signed)
Discussed with the patient and all questioned fully answered. He will call me if any problems arise.   IV removed, telemetry removed, CCMD notified.  Utilized teach back. Pt able to repeat when to call the MD related to daily weights and s/s of fluid overload.  Leonidas Romberg, RN

## 2016-05-03 NOTE — Discharge Instructions (Signed)
You need lab work in our office tomorrow the 13th very important.  Need to re-evaluate your potassium.  Have blood work done on 3rd floor of our [facility].   Weigh daily Call [phone] if weight climbs more than 3 pounds in a day or 5 pounds in a week. No salt to very little salt in your diet.  No more than 2000 mg in a day. Call if increased shortness of breath or increased swelling.  Heart Healthy Low salt diet, no fast foods.    We adjusted your medications.  Follow directions.  Please keep follow up appointment.

## 2016-05-03 NOTE — Progress Notes (Signed)
SUBJECTIVE:  Breathing at baseline.  No pain.  Wants to go home.    PHYSICAL EXAM Filed Vitals:   05/02/16 2142 05/02/16 2324 05/03/16 0415 05/03/16 0607  BP: 146/58 152/94 169/94 159/95  Pulse: 63  63   Temp: 98 F (36.7 C)  98.4 F (36.9 C)   TempSrc: Oral  Oral   Resp: 16  16   Height:      Weight:   212 lb 1.6 oz (96.208 kg)   SpO2: 99%  100%    General:  No distress Lungs:  Clear Heart:  RRR Abdomen:  Positive bowel sounds, no rebound no guarding\ Extremities:  No edema   LABS:  Results for orders placed or performed during the hospital encounter of 04/30/16 (from the past 24 hour(s))  Basic metabolic panel     Status: Abnormal   Collection Time: 05/02/16  2:10 PM  Result Value Ref Range   Sodium 137 135 - 145 mmol/L   Potassium 3.6 3.5 - 5.1 mmol/L   Chloride 101 101 - 111 mmol/L   CO2 28 22 - 32 mmol/L   Glucose, Bld 114 (H) 65 - 99 mg/dL   BUN 26 (H) 6 - 20 mg/dL   Creatinine, Ser 9.32 (H) 0.61 - 1.24 mg/dL   Calcium 9.0 8.9 - 67.1 mg/dL   GFR calc non Af Amer 41 (L) >60 mL/min   GFR calc Af Amer 47 (L) >60 mL/min   Anion gap 8 5 - 15  Basic metabolic panel     Status: Abnormal   Collection Time: 05/03/16  3:47 AM  Result Value Ref Range   Sodium 137 135 - 145 mmol/L   Potassium 3.7 3.5 - 5.1 mmol/L   Chloride 100 (L) 101 - 111 mmol/L   CO2 26 22 - 32 mmol/L   Glucose, Bld 98 65 - 99 mg/dL   BUN 26 (H) 6 - 20 mg/dL   Creatinine, Ser 2.45 (H) 0.61 - 1.24 mg/dL   Calcium 8.8 (L) 8.9 - 10.3 mg/dL   GFR calc non Af Amer 37 (L) >60 mL/min   GFR calc Af Amer 43 (L) >60 mL/min   Anion gap 11 5 - 15    Intake/Output Summary (Last 24 hours) at 05/03/16 0752 Last data filed at 05/02/16 1800  Gross per 24 hour  Intake    960 ml  Output      0 ml  Net    960 ml    ASSESSMENT AND PLAN:  ACUTE ON CHRONIC DIASTOLIC AND SYSTOLIC  HF:   EF is 35 - 40%.    Euvolemic.   HTN:  BP is still slightly elevated but much improved compared to baseline.   Compliance might still be an issue.  I am worried about adding clonidine secondary to problems with the past with compliance.   Need to try to consolidate hydralazine/nitrates to BiDil.    CKD:   Creat has bumped.  He is on oral Lasix.  Will follow closely.   Check creat tomorrow.    CAD:  No ischemic symptoms.  No inpatient work up.    HYPOKALEMIA:  Increase potassium today.    He got an extra 60 meq.  Send home on 60 daily.  I would like a BMET tomorrow.  Follow up in clinic no later than 7 days.   NSVT:  Telemetry reveals no NSVT.  Keep potassium above 4.  Continue beta blocker.   Rollene Rotunda 05/03/2016 7:52 AM

## 2016-05-04 ENCOUNTER — Other Ambulatory Visit: Payer: Self-pay

## 2016-05-05 NOTE — Telephone Encounter (Signed)
LMTCB

## 2016-05-06 NOTE — Telephone Encounter (Signed)
LMTCB

## 2016-05-10 ENCOUNTER — Encounter: Payer: Self-pay | Admitting: Nurse Practitioner

## 2016-05-10 ENCOUNTER — Ambulatory Visit (INDEPENDENT_AMBULATORY_CARE_PROVIDER_SITE_OTHER): Payer: Self-pay | Admitting: Nurse Practitioner

## 2016-05-10 ENCOUNTER — Other Ambulatory Visit: Payer: Self-pay | Admitting: *Deleted

## 2016-05-10 VITALS — BP 168/92 | HR 70 | Ht 67.0 in | Wt 214.0 lb

## 2016-05-10 DIAGNOSIS — N183 Chronic kidney disease, stage 3 unspecified: Secondary | ICD-10-CM

## 2016-05-10 DIAGNOSIS — I5022 Chronic systolic (congestive) heart failure: Secondary | ICD-10-CM

## 2016-05-10 DIAGNOSIS — G4733 Obstructive sleep apnea (adult) (pediatric): Secondary | ICD-10-CM

## 2016-05-10 DIAGNOSIS — I10 Essential (primary) hypertension: Secondary | ICD-10-CM

## 2016-05-10 LAB — BASIC METABOLIC PANEL
BUN: 23 mg/dL (ref 7–25)
CO2: 25 mmol/L (ref 20–31)
Calcium: 8.8 mg/dL (ref 8.6–10.3)
Chloride: 103 mmol/L (ref 98–110)
Creat: 1.56 mg/dL — ABNORMAL HIGH (ref 0.70–1.33)
Glucose, Bld: 115 mg/dL — ABNORMAL HIGH (ref 65–99)
Potassium: 3.7 mmol/L (ref 3.5–5.3)
Sodium: 140 mmol/L (ref 135–146)

## 2016-05-10 LAB — BRAIN NATRIURETIC PEPTIDE: Brain Natriuretic Peptide: 557 pg/mL — ABNORMAL HIGH (ref <100)

## 2016-05-10 MED ORDER — CARVEDILOL 12.5 MG PO TABS: 12.5000 mg | ORAL_TABLET | Freq: Two times a day (BID) | ORAL | Status: DC

## 2016-05-10 MED ORDER — HYDRALAZINE HCL 100 MG PO TABS: 100.0000 mg | ORAL_TABLET | Freq: Four times a day (QID) | ORAL | Status: DC

## 2016-05-10 NOTE — Patient Instructions (Addendum)
We will be checking the following labs today - BMET and BNP   Medication Instructions:    Continue with your current medicines. BUT go by this list.  Coreg is just 2 times a day  Hydralazine is 4 times a day    Testing/Procedures To Be Arranged:  N/A  Follow-Up:   See me this Friday at 10 AM - bring all your medicine bottles to this visit.  Referral to [facility]    Other Special Instructions:   Split night sleep study    If you need a refill on your cardiac medications before your next appointment, please call your pharmacy.   Call the [facility] office at [phone] if you have any questions, problems or concerns.

## 2016-05-10 NOTE — Progress Notes (Signed)
CARDIOLOGY OFFICE NOTE  Date:  05/10/2016    [name] Date of Birth: 1966-08-24 Medical Record #[id number]  PCP:  Concepcion Living, NP  Cardiologist:  [name]    Chief Complaint  Patient presents with  . Congestive Heart Failure  . Cardiomyopathy  . Hypertension    Post hospital visit - seen for Dr. [name]    History of Present Illness: [name] is a 50 y.o. male who presents today for a post hospital visit. Seen for Dr. [name].   This was to be a TOC visit - however, no phone call documented.   He has a history of refractory HTN, CAD with last cath in January of 2017 following abnormal Myoview, dyslipidemia, and HFrEF.   He presented to the ED on 04/30/16 with 2 days of worsening SOB and orthopnea.  After arrival to the ED, the ptremained hemodynamically stable and was saturating well on RA, although he continued to experience dyspnea. Given concern for ADHF with elevated BNP, as well as HTN urgency, he was seen and admitted by cardiology.   He was diuresed. His BP improved but still not at goal.  Echo now with EF at 35-40% up from 20-25% in Jan/2017. He has hx of normal renal arteries on duplex doppler 2014. His EF has improved, no need for ICD. He did have bradycardia to 37, and coreg was decreased to 12.5 mg BID.   Was to have had outpatient labs - not done. Needs outpatient split night sleep study - previously could not afford CPAP - apparently now he can.   Comes back today. Here alone. Says he remains short of breath - better than what led to his admission but still with symptoms. Says he is always short of breath no matter what and with just minimal exertion. Not able to work any more. Says he is not smoking. Does have chest tightness with exertion as well. His medicines are mixed up - taking Coreg three times a day and hydralazine only twice. Says he does not get salt. Reports weight is less at home. Says he has his medicines. Never seen Renal.    Past Medical History  Diagnosis Date  . CAD (coronary artery disease)     Small vessel with occluded small OM and nonobstructive large vessel disease (cath 02/2014)  . Obesity   . History of syncope 08/2008    Secondary to hypertension  . Tobacco abuse   . Hypertension, essential, benign     a. renal art [place] (6/14): no RAS  . Noncompliance   . CKD (chronic kidney disease)   . Nonischemic cardiomyopathy (HCC)     EF 45%  . Sleep apnea   . CHF (congestive heart failure) (HCC)   . Acute on chronic combined systolic and diastolic congestive heart failure, NYHA class 3 (HCC) 12/16/2015  . Coronary atherosclerosis due to lipid rich plaque 02/18/2009    Small OM occlusion with collaterals, 60% RCA, 40% LAD. April 2015    . Hypokalemia 05/03/2016  . Cardiomyopathy, nonischemic (HCC) 05/03/2016    Past Surgical History  Procedure Laterality Date  . None    . Left heart catheterization with coronary angiogram N/A 02/21/2014    Procedure: LEFT HEART CATHETERIZATION WITH CORONARY ANGIOGRAM;  Surgeon: Kathleene Hazel, MD;  Location: [facility] CATH LAB;  Service: Cardiovascular;  Laterality: N/A;  . Cardiac catheterization N/A 12/22/2015    Procedure: Left Heart Cath and Coronary Angiography;  Surgeon: Lyn Records, MD;  Location: [facility] INVASIVE CV LAB;  Service: Cardiovascular;  Laterality: N/A;     Medications: Current Outpatient Prescriptions  Medication Sig Dispense Refill  . amLODipine (NORVASC) 10 MG tablet Take 1 tablet (10 mg total) by mouth daily. 30 tablet 6  . aspirin EC 81 MG tablet Take 1 tablet (81 mg total) by mouth daily. 90 tablet 3  . atorvastatin (LIPITOR) 80 MG tablet Take 1 tablet (80 mg total) by mouth daily at 6 PM. 30 tablet 1  . carvedilol (COREG) 12.5 MG tablet Take 1 tablet (12.5 mg total) by mouth 2 (two) times daily with a meal. 60 tablet 6  . furosemide (LASIX) 40 MG tablet Take 2 tablets (80 mg total) by mouth daily. 60 tablet 12  . hydrALAZINE (APRESOLINE) 100 MG  tablet Take 1 tablet (100 mg total) by mouth every 6 (six) hours. 120 tablet 6  . isosorbide mononitrate (IMDUR) 30 MG 24 hr tablet Take 1 tablet (30 mg total) by mouth daily. 30 tablet 12  . lisinopril (PRINIVIL,ZESTRIL) 40 MG tablet Take 1 tablet (40 mg total) by mouth daily. 30 tablet 1  . Multiple Vitamin (MULTIVITAMIN WITH MINERALS) TABS tablet Take 1 tablet by mouth daily.    . nitroGLYCERIN (NITROSTAT) 0.4 MG SL tablet Place 1 tablet (0.4 mg total) under the tongue every 5 (five) minutes as needed. For chest pain 30 tablet 1  . potassium chloride 20 MEQ TBCR Take 60 mEq by mouth daily. 90 tablet 6   No current facility-administered medications for this visit.    Allergies: Allergies  Allergen Reactions  . Penicillins Other (See Comments)    Patient states that it makes his throat itch really bad, and feels like it is closing up.   . Shrimp [Shellfish Allergy]     Throat swells    Social History: The patient  reports that he has quit smoking. His smoking use included Cigarettes. He has a 3.2 pack-year smoking history. He does not have any smokeless tobacco history on file. He reports that he does not drink alcohol or use illicit drugs.   Family History: The patient's family history includes Heart disease in his maternal grandfather; Lupus in his mother. There is no history of Coronary artery disease.   Review of Systems: Please see the history of present illness.   Otherwise, the review of systems is positive for none.   All other systems are reviewed and negative.   Physical Exam: VS:  BP 168/92 mmHg  Pulse 70  Ht  (1.702 m)  Wt 214 lb (97.07 kg)  BMI 33.51 kg/m2 .  BMI Body mass index is 33.51 kg/(m^2).  Wt Readings from Last 3 Encounters:  05/10/16 214 lb (97.07 kg)  05/03/16 212 lb 1.6 oz (96.208 kg)  04/08/16 216 lb 3.2 oz (98.068 kg)    General: Alert black male who is alert and in no acute distress.  HEENT: Normal. Neck: Supple, no JVD, carotid bruits, or  masses noted.  Cardiac: Regular rate and rhythm. No murmurs, rubs, or gallops. No edema.  Respiratory:  Lungs are clear to auscultation bilaterally with normal work of breathing.  GI: Soft and nontender.  MS: No deformity or atrophy. Gait and ROM intact. Skin: Warm and dry. Color is normal.  Neuro:  Strength and sensation are intact and no gross focal deficits noted.  Psych: Alert, appropriate and with normal affect.   LABORATORY DATA:  EKG:  EKG is not ordered today.  Lab Results  Component Value Date  WBC 8.7 04/30/2016   HGB 13.7 04/30/2016   HCT 41.3 04/30/2016   PLT 261 04/30/2016   GLUCOSE 98 05/03/2016   CHOL 176 01/19/2016   TRIG 174* 01/19/2016   HDL 33* 01/19/2016   LDLDIRECT 165.5 08/14/2013   LDLCALC 108 01/19/2016   ALT 8* 01/19/2016   AST 12 01/19/2016   NA 137 05/03/2016   K 3.7 05/03/2016   CL 100* 05/03/2016   CREATININE 2.01* 05/03/2016   BUN 26* 05/03/2016   CO2 26 05/03/2016   TSH 0.875 09/08/2014   PSA 4.33* 01/19/2016   INR 1.09 12/22/2015   HGBA1C 5.8* 12/20/2015    BNP (last 3 results)  Recent Labs  12/15/15 0400 03/21/16 1112 04/30/16 0257  BNP 683.3* 867.1* 1257.6*    ProBNP (last 3 results) No results for input(s): PROBNP in the last 8760 hours.   Other Studies Reviewed Today:  Echo Study Conclusions from 04/2016  - Left ventricle: The cavity size was moderately dilated. Wall  thickness was increased in a pattern of moderate LVH. There was  mild concentric hypertrophy. Systolic function was moderately  reduced. The estimated ejection fraction was in the range of 35%  to 40%. Diffuse hypokinesis. Doppler parameters are consistent  with both elevated ventricular end-diastolic filling pressure and  elevated left atrial filling pressure. - Left atrium: The atrium was mildly dilated. - Atrial septum: No defect or patent foramen ovale was identified.   Procedures    Left Heart Cath and Coronary Angiography      Conclusion    1. Ost 1st Mrg to 1st Mrg lesion, 90% stenosed. 2. Lat Ramus lesion, 100% stenosed. 3. Ost LAD to Mid LAD lesion, 50% stenosed. 4. Prox Cx to Mid Cx lesion, 70% stenosed. 5. Dist LAD lesion, 80% stenosed. 6. Prox RCA to Mid RCA lesion, 65% stenosed.   Widely patent proximal to distal LAD. Circumflex is widely patent with 70% stenosis in the mid vessel. A branch of the first obtuse marginal is totally occluded. There is 80% stenosis in the proximal portion of the second obtuse marginal. The right coronary contains eccentric 50-60% proximal narrowing.  When the coronary anatomy is compared to the most recent angiogram from 1-1/2 years ago there is progression of stenosis in the second obtuse marginal.  Severe elevation of left ventricular end-diastolic pressure to 40 mmHg. This in conjunction with the echo EF of 25-30% suggests severe systolic heart failure and the need for more aggressive medical therapy including diuresis. Left ventricular dysfunction would appear to be out of proportion to the degree of coronary disease.   Recommendation:   With significant kidney impairment, PCI on the obtuse marginal could be considered at a later date. If angina is not present, I will continue to treat medically.  Aggressive medical therapy for systolic heart failure should include aggressive blood pressure control and diuresis.  80 cc of contrast used during the diagnostic procedure. Gentle IV hydration will be given over the next 24 hours. Follow-up kidney function in a.m.     Assessment/Plan: 1. Acute on chronic systolic failure - EF has improved to 35 to 40% by most recent echo - seems some better clinically but still with symptoms. Medicines are not being taken as directed.   2. Known CAD with progressive CAD on cath from January - may still need to consider PCI - now almost 6 months out and may need actual repeat cath as well.  3. CKD - he is on ACE - BP not controlled.  Needs to see Renal.  4. HTN - uncontrolled - not taking medicines correctly. Coreg cut back due to bradycardia. Not noted today. May benefit from referral to HTN clinic here. May need to consider Clonidine.   5. Chronic shortness of breath - check sats here in the office today. He is a past smoker. Oxygen sat is 98% here in the office today. Last CXR was with mild CHF. May need CPX testing to be considered.   6. Noncompliance - will need to see back on Friday and have him bring his bottles.   Current medicines are reviewed with the patient today.  The patient does not have concerns regarding medicines other than what has been noted above.  The following changes have been made:  See above.  Labs/ tests ordered today include:    Orders Placed This Encounter  Procedures  . Brain natriuretic peptide  . Basic metabolic panel  . Ambulatory referral to Nephrology     Disposition:   FU with me this Friday at 10 Am - he is to bring in all his medicine bottles.   Patient is agreeable to this plan and will call if any problems develop in the interim.   Signed: Rosalio Macadamia, RN, ANP-C 05/10/2016 9:29 AM  [facility] [address] [place], [place]  [address] Phone: [phone] Fax: [phone]

## 2016-05-10 NOTE — Telephone Encounter (Signed)
Pt seen today

## 2016-05-14 ENCOUNTER — Ambulatory Visit: Payer: Self-pay | Admitting: Nurse Practitioner

## 2016-05-19 ENCOUNTER — Encounter: Payer: Self-pay | Admitting: Nurse Practitioner

## 2016-07-05 ENCOUNTER — Ambulatory Visit (HOSPITAL_BASED_OUTPATIENT_CLINIC_OR_DEPARTMENT_OTHER): Payer: Self-pay | Attending: Nurse Practitioner

## 2016-07-15 NOTE — Progress Notes (Signed)
HPI: FU CAD and hypertension. Renal Dopplers June 2014 normal. Cardiac catheterization January 2017 showed a 90% first marginal, occluded ramus, 50% ost LAD, 70% circumflex, 80% distal LAD and 65% right coronary artery; EF 25-30; LVEDP 40. Echocardiogram January 2017 showed ejection fraction 20-25%, moderate left ventricular hypertrophy, restrictive filling with elevated left ventricular filling pressures, severe left atrial enlargement and moderate right atrial enlargement. Echocardiogram repeated June 2017 showed ejection fraction 35-40% and mild left atrial enlargement. Carvedilol reduced previously because of bradycardia. Patient with history of noncompliance. Since last seen, Patient has some dyspnea on exertion and orthopnea. He denies pedal edema, chest pain or syncope.  Current Outpatient Prescriptions  Medication Sig Dispense Refill  . amLODipine (NORVASC) 10 MG tablet Take 1 tablet (10 mg total) by mouth daily. 30 tablet 6  . aspirin EC 81 MG tablet Take 1 tablet (81 mg total) by mouth daily. 90 tablet 3  . atorvastatin (LIPITOR) 80 MG tablet Take 1 tablet (80 mg total) by mouth daily at 6 PM. 30 tablet 1  . carvedilol (COREG) 12.5 MG tablet Take 1 tablet (12.5 mg total) by mouth 2 (two) times daily with a meal. 60 tablet 6  . furosemide (LASIX) 40 MG tablet Take 2 tablets (80 mg total) by mouth daily. 60 tablet 12  . hydrALAZINE (APRESOLINE) 100 MG tablet Take 1 tablet (100 mg total) by mouth every 6 (six) hours. 120 tablet 6  . isosorbide mononitrate (IMDUR) 30 MG 24 hr tablet Take 1 tablet (30 mg total) by mouth daily. 30 tablet 12  . lisinopril (PRINIVIL,ZESTRIL) 40 MG tablet Take 1 tablet (40 mg total) by mouth daily. 30 tablet 1  . Multiple Vitamin (MULTIVITAMIN WITH MINERALS) TABS tablet Take 1 tablet by mouth daily.    . nitroGLYCERIN (NITROSTAT) 0.4 MG SL tablet Place 1 tablet (0.4 mg total) under the tongue every 5 (five) minutes as needed. For chest pain 30 tablet 1  .  potassium chloride 20 MEQ TBCR Take 60 mEq by mouth daily. 90 tablet 6   No current facility-administered medications for this visit.      Past Medical History:  Diagnosis Date  . Acute on chronic combined systolic and diastolic congestive heart failure, NYHA class 3 (HCC) 12/16/2015  . CAD (coronary artery disease)    Small vessel with occluded small OM and nonobstructive large vessel disease (cath 02/2014)  . Cardiomyopathy, nonischemic (HCC) 05/03/2016  . CHF (congestive heart failure) (HCC)   . CKD (chronic kidney disease)   . Coronary atherosclerosis due to lipid rich plaque 02/18/2009   Small OM occlusion with collaterals, 60% RCA, 40% LAD. April 2015    . History of syncope 08/2008   Secondary to hypertension  . Hypertension, essential, benign    a. renal art [place] (6/14): no RAS  . Hypokalemia 05/03/2016  . Noncompliance   . Nonischemic cardiomyopathy (HCC)    EF 45%  . Obesity   . Sleep apnea   . Tobacco abuse     Past Surgical History:  Procedure Laterality Date  . CARDIAC CATHETERIZATION N/A 12/22/2015   Procedure: Left Heart Cath and Coronary Angiography;  Surgeon: Lyn Records, MD;  Location: [facility] INVASIVE CV LAB;  Service: Cardiovascular;  Laterality: N/A;  . LEFT HEART CATHETERIZATION WITH CORONARY ANGIOGRAM N/A 02/21/2014   Procedure: LEFT HEART CATHETERIZATION WITH CORONARY ANGIOGRAM;  Surgeon: Kathleene Hazel, MD;  Location: [facility] CATH LAB;  Service: Cardiovascular;  Laterality: N/A;  . None  Social History   Social History  . Marital status: Single    Spouse name: N/A  . Number of children: 5  . Years of education: N/A   Occupational History  . [employer]   .  [employer]   Social History Main Topics  . Smoking status: Former Smoker    Packs/day: 0.10    Years: 32.00    Types: Cigarettes  . Smokeless tobacco: Not on file     Comment: Started smoking at age 50 18(1980).  Cutting back  . Alcohol use No  . Drug use: No  . Sexual activity: Not  Currently   Other Topics Concern  . Not on file   Social History Narrative   Lives with girlfriend.    Family History  Problem Relation Age of Onset  . Lupus Mother   . Heart disease Maternal Grandfather   . Coronary artery disease Neg Hx     ROS: no fevers or chills, productive cough, hemoptysis, dysphasia, odynophagia, melena, hematochezia, dysuria, hematuria, rash, seizure activity, orthopnea, PND, pedal edema, claudication. Remaining systems are negative.  Physical Exam: Well-developed well-nourished in no acute distress.  Skin is warm and dry.  HEENT is normal.  Neck is supple.  Chest is clear to auscultation with normal expansion.  Cardiovascular exam is regular rate and rhythm.  Abdominal exam nontender or distended. No masses palpated. Extremities show no edema. neuro grossly intact  ECG  A/P  1 Hypertension-blood pressure remains uncontrolled. He states he has been compliant with medications although that is not completely clear. Discontinue carvedilol. Add labetalol 200 mg twice a day. Increase Lasix to 80 mg twice a day. I have not added spironolactone given baseline renal insufficiency. Check potassium and renal function in 1 week. Check renal Dopplers. I have asked him to purchase a blood pressure cuff and monitor at home. I will refer him to hypertension clinic for more frequent adjustments of his blood pressure medications. I think this is his primary issue. I have explained the consequences of uncontrolled hypertension including CHF, CVA and dialysis.  2 coronary artery disease-continue aspirin and statin.  3 hyperlipidemia-continue statin.  4 Cardiomyopathy-this is felt secondary to hypertension. Continue lisinopril. Change carvedilol to labetalol for better blood pressure control. His most recent echocardiogram showed some improvement in LV function. I would like to repeat his echocardiogram once his blood pressure is fully controlled.  5 chronic kidney  disease stage III-check potassium and renal function in 1 week.  Olga MillersBrian Recardo Linn, MD

## 2016-07-19 ENCOUNTER — Ambulatory Visit (INDEPENDENT_AMBULATORY_CARE_PROVIDER_SITE_OTHER): Payer: Self-pay | Admitting: Cardiology

## 2016-07-19 ENCOUNTER — Encounter: Payer: Self-pay | Admitting: Cardiology

## 2016-07-19 ENCOUNTER — Encounter (INDEPENDENT_AMBULATORY_CARE_PROVIDER_SITE_OTHER): Payer: Self-pay

## 2016-07-19 VITALS — BP 190/110 | HR 77 | Ht 67.0 in | Wt 214.0 lb

## 2016-07-19 DIAGNOSIS — I2510 Atherosclerotic heart disease of native coronary artery without angina pectoris: Secondary | ICD-10-CM

## 2016-07-19 DIAGNOSIS — Z79899 Other long term (current) drug therapy: Secondary | ICD-10-CM

## 2016-07-19 DIAGNOSIS — E785 Hyperlipidemia, unspecified: Secondary | ICD-10-CM

## 2016-07-19 DIAGNOSIS — I10 Essential (primary) hypertension: Secondary | ICD-10-CM

## 2016-07-19 MED ORDER — LABETALOL HCL 200 MG PO TABS: 200.0000 mg | ORAL_TABLET | Freq: Two times a day (BID) | ORAL | 3 refills | Status: DC

## 2016-07-19 MED ORDER — FUROSEMIDE 80 MG PO TABS: 80.0000 mg | ORAL_TABLET | Freq: Two times a day (BID) | ORAL | 3 refills | Status: DC

## 2016-07-19 NOTE — Patient Instructions (Signed)
Medication Instructions:  Your physician has recommended you make the following change in your medication:  1- STOP TAKING YOUR Carvedilol. 2- INCREASE Furosemide to 80 mg by mouth twice per day. 3- START TAKING Labetalol 200mg  (1 tablet) by mouth twice per day.   Labwork: Your physician recommends that you return for lab work IN ONE WEEK.   Testing/Procedures: Your physician has requested that you have a renal artery duplex. During this test, an ultrasound is used to evaluate blood flow to the kidneys. Allow one hour for this exam. Do not eat after midnight the day before and avoid carbonated beverages. Take your medications as you usually do.    Follow-Up: You have been referred to  OUR PHARMACY FOR THE BLOOD PRESSURE CLINIC IN 1 WEEK, PLEASE.  Your physician has requested that you regularly monitor and record your blood pressure readings at home. Please use the same machine at the same time of day to check your readings and record them to bring to your follow-up visit.   Your physician recommends that you schedule a follow-up appointment in: 3 MONTHS WITH DR CRENSHAW.   If you need a refill on your cardiac medications before your next appointment, please call your pharmacy.

## 2016-07-22 ENCOUNTER — Ambulatory Visit: Payer: Self-pay | Admitting: Family Medicine

## 2016-07-27 ENCOUNTER — Ambulatory Visit: Payer: Self-pay

## 2016-07-27 NOTE — Progress Notes (Deleted)
Patient ID: [name]                 DOB: 11-10-1966                      MRN: [id number]     HPI: RIOT SIPP is a 50 y.o. male patient of Dr. [name] with PMH below who presents today for hypertension evaluation.   Cardiac Hx: HTN, CHF, cardiomyopathy, bradycardia, OSA, CKD  Current HTN meds:  Furosemide 80mg  BID Isosorbide mononitrate 30mg   Labetalol 200mg  BID Amlodipine 10mg  Hydralazine 100mg  Q6H Lisinopril 40mg     Previously tried:   BP goal:   Family History:   Social History:   Diet:   Exercise:   Home BP readings:   Wt Readings from Last 3 Encounters:  07/19/16 214 lb (97.1 kg)  05/10/16 214 lb (97.1 kg)  05/03/16 212 lb 1.6 oz (96.2 kg)   BP Readings from Last 3 Encounters:  07/19/16 (!) 190/110  05/10/16 (!) 168/92  05/03/16 (!) 159/95   Pulse Readings from Last 3 Encounters:  07/19/16 77  05/10/16 70  05/03/16 64    Renal function: CrCl cannot be calculated (Patient's most recent lab result is older than the maximum 21 days allowed.).  Past Medical History:  Diagnosis Date  . Acute on chronic combined systolic and diastolic congestive heart failure, NYHA class 3 (HCC) 12/16/2015  . CAD (coronary artery disease)    Small vessel with occluded small OM and nonobstructive large vessel disease (cath 02/2014)  . Cardiomyopathy, nonischemic (HCC) 05/03/2016  . CHF (congestive heart failure) (HCC)   . CKD (chronic kidney disease)   . Coronary atherosclerosis due to lipid rich plaque 02/18/2009   Small OM occlusion with collaterals, 60% RCA, 40% LAD. April 2015    . History of syncope 08/2008   Secondary to hypertension  . Hypertension, essential, benign    a. renal art [place] (6/14): no RAS  . Hypokalemia 05/03/2016  . Noncompliance   . Nonischemic cardiomyopathy (HCC)    EF 45%  . Obesity   . Sleep apnea   . Tobacco abuse     Current Outpatient Prescriptions on File Prior to Visit  Medication Sig Dispense Refill  . amLODipine (NORVASC)  10 MG tablet Take 1 tablet (10 mg total) by mouth daily. 30 tablet 6  . aspirin EC 81 MG tablet Take 1 tablet (81 mg total) by mouth daily. 90 tablet 3  . atorvastatin (LIPITOR) 80 MG tablet Take 1 tablet (80 mg total) by mouth daily at 6 PM. 30 tablet 1  . furosemide (LASIX) 80 MG tablet Take 1 tablet (80 mg total) by mouth 2 (two) times daily. 180 tablet 3  . hydrALAZINE (APRESOLINE) 100 MG tablet Take 1 tablet (100 mg total) by mouth every 6 (six) hours. 120 tablet 6  . isosorbide mononitrate (IMDUR) 30 MG 24 hr tablet Take 1 tablet (30 mg total) by mouth daily. 30 tablet 12  . labetalol (NORMODYNE) 200 MG tablet Take 1 tablet (200 mg total) by mouth 2 (two) times daily. 180 tablet 3  . lisinopril (PRINIVIL,ZESTRIL) 40 MG tablet Take 1 tablet (40 mg total) by mouth daily. 30 tablet 1  . Multiple Vitamin (MULTIVITAMIN WITH MINERALS) TABS tablet Take 1 tablet by mouth daily.    . nitroGLYCERIN (NITROSTAT) 0.4 MG SL tablet Place 1 tablet (0.4 mg total) under the tongue every 5 (five) minutes as needed. For chest pain 30 tablet 1  .  potassium chloride 20 MEQ TBCR Take 60 mEq by mouth daily. 90 tablet 6   No current facility-administered medications on file prior to visit.     Allergies  Allergen Reactions  . Penicillins Other (See Comments)    Patient states that it makes his throat itch really bad, and feels like it is closing up.   . Shrimp [Shellfish Allergy]     Throat swells    There were no vitals taken for this visit.   Assessment/Plan: Hypertension:    Thank you, Freddie ApleyKelley M. Cleatis PolkaAuten, PharmD  [facility]  07/27/2016 9:39 AM

## 2016-08-06 ENCOUNTER — Inpatient Hospital Stay (HOSPITAL_COMMUNITY): Admission: RE | Admit: 2016-08-06 | Payer: Self-pay | Source: Ambulatory Visit

## 2016-08-15 ENCOUNTER — Emergency Department (HOSPITAL_COMMUNITY): Payer: Self-pay

## 2016-08-15 ENCOUNTER — Inpatient Hospital Stay (HOSPITAL_COMMUNITY)
Admission: EM | Admit: 2016-08-15 | Discharge: 2016-08-16 | DRG: 305 | Disposition: A | Discharge status: Home / Self Care | Payer: Self-pay | Attending: Internal Medicine | Admitting: Internal Medicine

## 2016-08-15 ENCOUNTER — Encounter (HOSPITAL_COMMUNITY): Payer: Self-pay

## 2016-08-15 DIAGNOSIS — Z6832 Body mass index (BMI) 32.0-32.9, adult: Secondary | ICD-10-CM

## 2016-08-15 DIAGNOSIS — I420 Dilated cardiomyopathy: Secondary | ICD-10-CM | POA: Diagnosis present

## 2016-08-15 DIAGNOSIS — R7989 Other specified abnormal findings of blood chemistry: Secondary | ICD-10-CM

## 2016-08-15 DIAGNOSIS — Z79899 Other long term (current) drug therapy: Secondary | ICD-10-CM

## 2016-08-15 DIAGNOSIS — Z9119 Patient's noncompliance with other medical treatment and regimen: Secondary | ICD-10-CM

## 2016-08-15 DIAGNOSIS — Z87891 Personal history of nicotine dependence: Secondary | ICD-10-CM

## 2016-08-15 DIAGNOSIS — I169 Hypertensive crisis, unspecified: Secondary | ICD-10-CM | POA: Insufficient documentation

## 2016-08-15 DIAGNOSIS — I2583 Coronary atherosclerosis due to lipid rich plaque: Secondary | ICD-10-CM | POA: Diagnosis present

## 2016-08-15 DIAGNOSIS — Z880 Allergy status to penicillin: Secondary | ICD-10-CM

## 2016-08-15 DIAGNOSIS — E669 Obesity, unspecified: Secondary | ICD-10-CM | POA: Diagnosis present

## 2016-08-15 DIAGNOSIS — Z91199 Patient's noncompliance with other medical treatment and regimen due to unspecified reason: Secondary | ICD-10-CM

## 2016-08-15 DIAGNOSIS — I2510 Atherosclerotic heart disease of native coronary artery without angina pectoris: Secondary | ICD-10-CM | POA: Diagnosis present

## 2016-08-15 DIAGNOSIS — Z7982 Long term (current) use of aspirin: Secondary | ICD-10-CM

## 2016-08-15 DIAGNOSIS — I5043 Acute on chronic combined systolic (congestive) and diastolic (congestive) heart failure: Secondary | ICD-10-CM

## 2016-08-15 DIAGNOSIS — I160 Hypertensive urgency: Principal | ICD-10-CM | POA: Diagnosis present

## 2016-08-15 DIAGNOSIS — G4730 Sleep apnea, unspecified: Secondary | ICD-10-CM | POA: Diagnosis present

## 2016-08-15 DIAGNOSIS — I5042 Chronic combined systolic (congestive) and diastolic (congestive) heart failure: Secondary | ICD-10-CM | POA: Diagnosis present

## 2016-08-15 DIAGNOSIS — Z8249 Family history of ischemic heart disease and other diseases of the circulatory system: Secondary | ICD-10-CM

## 2016-08-15 DIAGNOSIS — I248 Other forms of acute ischemic heart disease: Secondary | ICD-10-CM | POA: Diagnosis present

## 2016-08-15 DIAGNOSIS — I509 Heart failure, unspecified: Secondary | ICD-10-CM

## 2016-08-15 DIAGNOSIS — I130 Hypertensive heart and chronic kidney disease with heart failure and stage 1 through stage 4 chronic kidney disease, or unspecified chronic kidney disease: Secondary | ICD-10-CM | POA: Diagnosis present

## 2016-08-15 DIAGNOSIS — I161 Hypertensive emergency: Secondary | ICD-10-CM

## 2016-08-15 DIAGNOSIS — Z9114 Patient's other noncompliance with medication regimen: Secondary | ICD-10-CM

## 2016-08-15 DIAGNOSIS — Z91120 Patient's intentional underdosing of medication regimen due to financial hardship: Secondary | ICD-10-CM

## 2016-08-15 DIAGNOSIS — I255 Ischemic cardiomyopathy: Secondary | ICD-10-CM

## 2016-08-15 DIAGNOSIS — E876 Hypokalemia: Secondary | ICD-10-CM | POA: Diagnosis present

## 2016-08-15 DIAGNOSIS — I10 Essential (primary) hypertension: Secondary | ICD-10-CM

## 2016-08-15 DIAGNOSIS — N183 Chronic kidney disease, stage 3 unspecified: Secondary | ICD-10-CM | POA: Diagnosis present

## 2016-08-15 DIAGNOSIS — R778 Other specified abnormalities of plasma proteins: Secondary | ICD-10-CM | POA: Diagnosis present

## 2016-08-15 LAB — I-STAT ARTERIAL BLOOD GAS, ED
Acid-Base Excess: 5 mmol/L — ABNORMAL HIGH (ref 0.0–2.0)
Bicarbonate: 28.5 mmol/L — ABNORMAL HIGH (ref 20.0–28.0)
O2 SAT: 98 %
PH ART: 7.485 — AB (ref 7.350–7.450)
TCO2: 30 mmol/L (ref 0–100)
pCO2 arterial: 37.8 mmHg (ref 32.0–48.0)
pO2, Arterial: 99 mmHg (ref 83.0–108.0)

## 2016-08-15 LAB — BASIC METABOLIC PANEL
ANION GAP: 11 (ref 5–15)
BUN: 13 mg/dL (ref 6–20)
CHLORIDE: 97 mmol/L — AB (ref 101–111)
CO2: 26 mmol/L (ref 22–32)
CREATININE: 1.74 mg/dL — AB (ref 0.61–1.24)
Calcium: 9 mg/dL (ref 8.9–10.3)
GFR calc non Af Amer: 44 mL/min — ABNORMAL LOW (ref >60)
GFR, EST AFRICAN AMERICAN: 51 mL/min — AB (ref >60)
Glucose, Bld: 126 mg/dL — ABNORMAL HIGH (ref 65–99)
POTASSIUM: 2.8 mmol/L — AB (ref 3.5–5.1)
SODIUM: 134 mmol/L — AB (ref 135–145)

## 2016-08-15 LAB — CBC
HCT: 47.7 % (ref 39.0–52.0)
HEMOGLOBIN: 16.2 g/dL (ref 13.0–17.0)
MCH: 30.7 pg (ref 26.0–34.0)
MCHC: 34 g/dL (ref 30.0–36.0)
MCV: 90.5 fL (ref 78.0–100.0)
PLATELETS: 281 10*3/uL (ref 150–400)
RBC: 5.27 MIL/uL (ref 4.22–5.81)
RDW: 13.1 % (ref 11.5–15.5)
WBC: 6 10*3/uL (ref 4.0–10.5)

## 2016-08-15 LAB — MRSA PCR SCREENING: MRSA BY PCR: NEGATIVE

## 2016-08-15 LAB — I-STAT TROPONIN, ED: Troponin i, poc: 0.07 ng/mL (ref 0.00–0.08)

## 2016-08-15 LAB — TROPONIN I
TROPONIN I: 0.09 ng/mL — AB (ref <0.03)
TROPONIN I: 0.09 ng/mL — AB (ref <0.03)

## 2016-08-15 LAB — BRAIN NATRIURETIC PEPTIDE: B NATRIURETIC PEPTIDE 5: 851 pg/mL — AB (ref 0.0–100.0)

## 2016-08-15 LAB — CBG MONITORING, ED: GLUCOSE-CAPILLARY: 141 mg/dL — AB (ref 65–99)

## 2016-08-15 MED ORDER — ATORVASTATIN CALCIUM 80 MG PO TABS: 80.0000 mg | ORAL_TABLET | Freq: Every day | ORAL | Status: DC

## 2016-08-15 MED ORDER — LABETALOL HCL 5 MG/ML IV SOLN
20.0000 mg | Freq: Once | INTRAVENOUS | Status: AC
  Administered 2016-08-15: 20 mg via INTRAVENOUS
  Filled 2016-08-15: qty 4

## 2016-08-15 MED ORDER — HYDRALAZINE HCL 100 MG PO TABS: 100.0000 mg | ORAL_TABLET | Freq: Two times a day (BID) | ORAL | Status: DC

## 2016-08-15 MED ORDER — NITROGLYCERIN IN D5W 200-5 MCG/ML-% IV SOLN: 0.0000 ug/min | Freq: Once | INTRAVENOUS | Status: DC

## 2016-08-15 MED ORDER — FUROSEMIDE 80 MG PO TABS
80.0000 mg | ORAL_TABLET | Freq: Two times a day (BID) | ORAL | Status: DC
  Administered 2016-08-15 – 2016-08-16 (×2): 80 mg via ORAL
  Filled 2016-08-15 (×2): qty 1

## 2016-08-15 MED ORDER — LISINOPRIL 40 MG PO TABS
40.0000 mg | ORAL_TABLET | Freq: Every day | ORAL | Status: DC
  Administered 2016-08-16: 40 mg via ORAL
  Filled 2016-08-15: qty 1

## 2016-08-15 MED ORDER — NITROGLYCERIN IN D5W 200-5 MCG/ML-% IV SOLN
0.0000 ug/min | Freq: Once | INTRAVENOUS | Status: AC
  Administered 2016-08-15: 5 ug/min via INTRAVENOUS
  Filled 2016-08-15: qty 250

## 2016-08-15 MED ORDER — ASPIRIN EC 81 MG PO TBEC
81.0000 mg | DELAYED_RELEASE_TABLET | Freq: Every day | ORAL | Status: DC
  Administered 2016-08-16: 81 mg via ORAL
  Filled 2016-08-15: qty 1

## 2016-08-15 MED ORDER — POTASSIUM CHLORIDE CRYS ER 20 MEQ PO TBCR
40.0000 meq | EXTENDED_RELEASE_TABLET | Freq: Once | ORAL | Status: AC
  Administered 2016-08-15: 40 meq via ORAL
  Filled 2016-08-15: qty 2

## 2016-08-15 MED ORDER — MORPHINE SULFATE (PF) 2 MG/ML IV SOLN
1.0000 mg | INTRAVENOUS | Status: DC | PRN
  Administered 2016-08-15 – 2016-08-16 (×2): 1 mg via INTRAVENOUS
  Filled 2016-08-15 (×2): qty 1

## 2016-08-15 MED ORDER — NITROGLYCERIN 0.4 MG SL SUBL: 0.4000 mg | SUBLINGUAL_TABLET | SUBLINGUAL | Status: DC | PRN

## 2016-08-15 MED ORDER — LABETALOL HCL 200 MG PO TABS
400.0000 mg | ORAL_TABLET | Freq: Two times a day (BID) | ORAL | Status: DC
  Administered 2016-08-15 – 2016-08-16 (×2): 400 mg via ORAL
  Filled 2016-08-15 (×2): qty 2

## 2016-08-15 MED ORDER — MORPHINE SULFATE (PF) 4 MG/ML IV SOLN
4.0000 mg | Freq: Once | INTRAVENOUS | Status: AC
  Administered 2016-08-15: 4 mg via INTRAVENOUS
  Filled 2016-08-15: qty 1

## 2016-08-15 MED ORDER — AMLODIPINE BESYLATE 10 MG PO TABS
10.0000 mg | ORAL_TABLET | Freq: Every day | ORAL | Status: DC
  Administered 2016-08-16: 10 mg via ORAL
  Filled 2016-08-15: qty 1

## 2016-08-15 MED ORDER — HYDRALAZINE HCL 50 MG PO TABS
100.0000 mg | ORAL_TABLET | Freq: Two times a day (BID) | ORAL | Status: DC
  Administered 2016-08-16: 100 mg via ORAL
  Filled 2016-08-15: qty 2

## 2016-08-15 MED ORDER — POTASSIUM CHLORIDE 10 MEQ/100ML IV SOLN
10.0000 meq | INTRAVENOUS | Status: DC
  Administered 2016-08-15: 10 meq via INTRAVENOUS
  Filled 2016-08-15 (×2): qty 100

## 2016-08-15 MED ORDER — FUROSEMIDE 10 MG/ML IJ SOLN
40.0000 mg | Freq: Once | INTRAMUSCULAR | Status: AC
  Administered 2016-08-15: 40 mg via INTRAVENOUS

## 2016-08-15 MED ORDER — ISOSORBIDE MONONITRATE ER 30 MG PO TB24
30.0000 mg | ORAL_TABLET | Freq: Every day | ORAL | Status: DC
  Administered 2016-08-16: 30 mg via ORAL
  Filled 2016-08-15: qty 1

## 2016-08-15 MED ORDER — ACETAMINOPHEN 325 MG PO TABS
650.0000 mg | ORAL_TABLET | Freq: Four times a day (QID) | ORAL | Status: DC | PRN
  Administered 2016-08-15 – 2016-08-16 (×2): 650 mg via ORAL
  Filled 2016-08-15 (×2): qty 2

## 2016-08-15 MED ORDER — HYDRALAZINE HCL 20 MG/ML IJ SOLN
10.0000 mg | Freq: Once | INTRAMUSCULAR | Status: AC
  Administered 2016-08-15: 10 mg via INTRAVENOUS
  Filled 2016-08-15: qty 1

## 2016-08-15 MED ORDER — POTASSIUM CHLORIDE 10 MEQ/100ML IV SOLN
10.0000 meq | INTRAVENOUS | Status: AC
  Administered 2016-08-15: 10 meq via INTRAVENOUS

## 2016-08-15 MED ORDER — FUROSEMIDE 10 MG/ML IJ SOLN
80.0000 mg | Freq: Once | INTRAMUSCULAR | Status: DC
  Filled 2016-08-15: qty 8

## 2016-08-15 MED ORDER — NITROGLYCERIN IN D5W 200-5 MCG/ML-% IV SOLN
0.0000 ug/min | INTRAVENOUS | Status: DC
  Administered 2016-08-16: 55 ug/min via INTRAVENOUS
  Filled 2016-08-15: qty 250

## 2016-08-15 MED ORDER — ENOXAPARIN SODIUM 40 MG/0.4ML ~~LOC~~ SOLN
40.0000 mg | SUBCUTANEOUS | Status: DC
  Administered 2016-08-16: 40 mg via SUBCUTANEOUS
  Filled 2016-08-15: qty 0.4

## 2016-08-15 NOTE — ED Notes (Signed)
Critical Lab value: Troponin 0.09 Dr. Manus Gunning made aware. Dr. Manus Gunning also made aware pt HA pain 5/10, pt increasingly SOB.

## 2016-08-15 NOTE — ED Triage Notes (Addendum)
PER EMS: pt from home with c/o non-radiating sharp, anterior gradual onset CP that started Friday associated with reports of dyspnea. RR unlabored at this time. 98% room air. BP-175/101, HR-72 regular. EMS adm 324 aspirin and 1 SL nitro en route.

## 2016-08-15 NOTE — ED Notes (Signed)
Dr. Manus Gunning informed pts BP in right arm of 121/85 and came to assess patient in room. BP rechecked in right arm 112/67. Pt still reports chest pain. Nitro drip stopped.

## 2016-08-15 NOTE — ED Provider Notes (Signed)
[facility]-EMERGENCY DEPT Provider Note   CSN: [id number] Arrival date & time: 08/15/16  1478     History   Chief Complaint Chief Complaint  Patient presents with  . Chest Pain    HPI CAROL LOFTIN is a 50 y.o. male.  Patient presents from home by EMS with 3 days of constant chest pain is briskly worsening. Pain is in the center of his chest associated with shortness of breath. Patient reports history of coronary artery blockages but does not have any stents. nothing Makes the pain better or worse. He reports difficulty breathing today that is worse than usual.  patient with history of systolic heart failure as well as CAD. States compliance with his medications. Denies any leg swelling. Denies any headache, abdominal pain, nausea vomiting or back pain. Denies any fever. He was given aspirin and nitroglycerin by EMS with minimal relief   The history is provided by the patient.  Chest Pain   Associated symptoms include shortness of breath. Pertinent negatives include no abdominal pain, no dizziness, no fever, no headaches, no nausea, no vomiting and no weakness.    Past Medical History:  Diagnosis Date  . Acute on chronic combined systolic and diastolic congestive heart failure, NYHA class 3 (HCC) 12/16/2015  . CAD (coronary artery disease)    Small vessel with occluded small OM and nonobstructive large vessel disease (cath 02/2014)  . Cardiomyopathy, nonischemic (HCC) 05/03/2016  . CHF (congestive heart failure) (HCC)   . CKD (chronic kidney disease)   . Coronary atherosclerosis due to lipid rich plaque 02/18/2009   Small OM occlusion with collaterals, 60% RCA, 40% LAD. April 2015    . History of syncope 08/2008   Secondary to hypertension  . Hypertension, essential, benign    a. renal art [place] (6/14): no RAS  . Hypokalemia 05/03/2016  . Noncompliance   . Nonischemic cardiomyopathy (HCC)    EF 45%  . Obesity   . Sleep apnea   . Tobacco abuse     Patient Active Problem List   Diagnosis Date Noted  . Hypokalemia 05/03/2016  . Cardiomyopathy, nonischemic (HCC) 05/03/2016  . Sinus bradycardia 05/03/2016  . ADHF (acute decompensated heart failure) (HCC) 04/30/2016  . OSA (obstructive sleep apnea)   . Pain in the chest   . Cranial nerve VI palsy   . Chest pain   . NSTEMI (non-ST elevated myocardial infarction) (HCC)   . Abnormal stress test   . Acute on chronic combined systolic and diastolic congestive heart failure, NYHA class 3 (HCC) 12/16/2015  . SOB (shortness of breath)   . Hypertensive emergency 12/15/2015  . Elevated troponin 12/15/2015  . Pulmonary edema 12/15/2015  . CKD (chronic kidney disease), stage III 12/15/2015  . Hepatitis 12/15/2015  . Hypertensive urgency 09/08/2014  . Accelerated hypertension 01/24/2014  . Erectile dysfunction 10/29/2013  . OBSTRUCTIVE SLEEP APNEA 11/24/2009  . OBESITY 02/18/2009  . HYPERTENSION, BENIGN 02/18/2009  . Coronary atherosclerosis due to lipid rich plaque 02/18/2009  . CHEST PAIN-UNSPECIFIED 02/18/2009  . SYNCOPE, HX OF 02/18/2009    Past Surgical History:  Procedure Laterality Date  . CARDIAC CATHETERIZATION N/A 12/22/2015   Procedure: Left Heart Cath and Coronary Angiography;  Surgeon: Lyn Records, MD;  Location: [facility] INVASIVE CV LAB;  Service: Cardiovascular;  Laterality: N/A;  . LEFT HEART CATHETERIZATION WITH CORONARY ANGIOGRAM N/A 02/21/2014   Procedure: LEFT HEART CATHETERIZATION WITH CORONARY ANGIOGRAM;  Surgeon: Kathleene Hazel, MD;  Location: [facility] CATH LAB;  Service: Cardiovascular;  Laterality:  N/A;  . None      OB History    No data available       Home Medications    Prior to Admission medications   Medication Sig Start Date End Date Taking? Authorizing Provider  amLODipine (NORVASC) 10 MG tablet Take 1 tablet (10 mg total) by mouth daily. 05/03/16   Leone Brand, NP  aspirin EC 81 MG tablet Take 1 tablet (81 mg total) by mouth daily. 12/23/15   Costin Otelia Sergeant, MD  atorvastatin  (LIPITOR) 80 MG tablet Take 1 tablet (80 mg total) by mouth daily at 6 PM. 12/23/15   Costin Otelia Sergeant, MD  furosemide (LASIX) 80 MG tablet Take 1 tablet (80 mg total) by mouth 2 (two) times daily. 07/19/16 10/17/16  Lewayne Bunting, MD  hydrALAZINE (APRESOLINE) 100 MG tablet Take 1 tablet (100 mg total) by mouth every 6 (six) hours. 05/10/16   Rosalio Macadamia, NP  isosorbide mononitrate (IMDUR) 30 MG 24 hr tablet Take 1 tablet (30 mg total) by mouth daily. 04/08/16   Lewayne Bunting, MD  labetalol (NORMODYNE) 200 MG tablet Take 1 tablet (200 mg total) by mouth 2 (two) times daily. 07/19/16   Lewayne Bunting, MD  lisinopril (PRINIVIL,ZESTRIL) 40 MG tablet Take 1 tablet (40 mg total) by mouth daily. 12/23/15   Costin Otelia Sergeant, MD  Multiple Vitamin (MULTIVITAMIN WITH MINERALS) TABS tablet Take 1 tablet by mouth daily.    Historical Provider, MD  nitroGLYCERIN (NITROSTAT) 0.4 MG SL tablet Place 1 tablet (0.4 mg total) under the tongue every 5 (five) minutes as needed. For chest pain 12/23/15   Leatha Gilding, MD  potassium chloride 20 MEQ TBCR Take 60 mEq by mouth daily. 05/03/16   Leone Brand, NP    Family History Family History  Problem Relation Age of Onset  . Lupus Mother   . Heart disease Maternal Grandfather   . Coronary artery disease Neg Hx     Social History Social History  Substance Use Topics  . Smoking status: Former Smoker    Packs/day: 0.10    Years: 32.00    Types: Cigarettes  . Smokeless tobacco: Never Used     Comment: Started smoking at age 9 (45).  Cutting back  . Alcohol use No     Allergies   Penicillins and Shrimp [shellfish allergy]   Review of Systems Review of Systems  Constitutional: Positive for activity change, appetite change and fatigue. Negative for fever.  HENT: Negative for congestion.   Respiratory: Positive for chest tightness and shortness of breath.   Cardiovascular: Positive for chest pain. Negative for leg swelling.  Gastrointestinal:  Negative for abdominal pain, nausea and vomiting.  Genitourinary: Negative for dysuria, hematuria and urgency.  Musculoskeletal: Negative for arthralgias.  Skin: Negative for rash.  Neurological: Negative for dizziness, weakness and headaches.   A complete 10 system review of systems was obtained and all systems are negative except as noted in the HPI and PMH.    Physical Exam Updated Vital Signs BP (!) 179/118 (BP Location: Left Arm)   Pulse 97   Temp 98.6 F (37 C) (Oral)   Resp 18   SpO2 98%   Physical Exam  Constitutional: He is oriented to person, place, and time. He appears well-developed and well-nourished. He appears distressed.  Increased work of breathing  HENT:  Head: Normocephalic and atraumatic.  Mouth/Throat: Oropharynx is clear and moist. No oropharyngeal exudate.  Eyes: Conjunctivae and EOM  are normal. Pupils are equal, round, and reactive to light.  Neck: Normal range of motion. Neck supple.  No meningismus.  Cardiovascular: Normal rate, regular rhythm, normal heart sounds and intact distal pulses.   No murmur heard. Pulmonary/Chest: Effort normal. No respiratory distress. He has rales. He exhibits no tenderness.  Crackles at bases Dyspneic with converation  Abdominal: Soft. There is no tenderness. There is no rebound and no guarding.  Musculoskeletal: Normal range of motion. He exhibits no edema or tenderness.  Neurological: He is alert and oriented to person, place, and time. No cranial nerve deficit. He exhibits normal muscle tone. Coordination normal.   5/5 strength throughout. CN 2-12 intact.Equal grip strength.   Skin: Skin is warm.  Psychiatric: He has a normal mood and affect. His behavior is normal.  Nursing note and vitals reviewed.    ED Treatments / Results  Labs (all labs ordered are listed, but only abnormal results are displayed) Labs Reviewed  BASIC METABOLIC PANEL - Abnormal; Notable for the following:       Result Value   Sodium 134  (*)    Potassium 2.8 (*)    Chloride 97 (*)    Glucose, Bld 126 (*)    Creatinine, Ser 1.74 (*)    GFR calc non Af Amer 44 (*)    GFR calc Af Amer 51 (*)    All other components within normal limits  BRAIN NATRIURETIC PEPTIDE - Abnormal; Notable for the following:    B Natriuretic Peptide 851.0 (*)    All other components within normal limits  TROPONIN I - Abnormal; Notable for the following:    Troponin I 0.09 (*)    All other components within normal limits  I-STAT ARTERIAL BLOOD GAS, ED - Abnormal; Notable for the following:    pH, Arterial 7.485 (*)    Bicarbonate 28.5 (*)    Acid-Base Excess 5.0 (*)    All other components within normal limits  CBG MONITORING, ED - Abnormal; Notable for the following:    Glucose-Capillary 141 (*)    All other components within normal limits  MRSA PCR SCREENING  CBC  ALDOSTERONE + RENIN ACTIVITY W/ RATIO  TROPONIN I  TROPONIN I  TROPONIN I  BASIC METABOLIC PANEL  CBC  I-STAT TROPOININ, ED    EKG  EKG Interpretation  Date/Time:  Sunday August 15 2016 07:25:30 EDT Ventricular Rate:  97 PR Interval:    QRS Duration: 96 QT Interval:  373 QTC Calculation: 474 R Axis:   19 Text Interpretation:  Sinus rhythm Biatrial enlargement LVH with secondary repolarization abnormality Anterior ST elevation, probably due to LVH similar ST elevation in v2 and v3, T wave inversions latereally Confirmed by Manus GunningANCOUR  MD, Rhyen Mazariego [phone]([id number]) on 08/15/2016 7:32:12 AM       Radiology Dg Chest 2 View  Result Date: 08/15/2016 CLINICAL DATA:  Chest pain for the past 2 days. Shortness of breath for several days. EXAM: CHEST  2 VIEW COMPARISON:  04/30/2016. FINDINGS: Enlarged cardiac silhouette with improvement. Clear lungs. Mild to moderate central peribronchial thickening, also improved. Thoracic and upper lumbar spine degenerative changes. IMPRESSION: No acute abnormality. Mild to moderate bronchitic changes with improvement. Electronically Signed   By:  Beckie SaltsSteven  Reid M.D.   On: 08/15/2016 08:31    Procedures Procedures (including critical care time)  Medications Ordered in ED Medications  nitroGLYCERIN 50 mg in dextrose 5 % 250 mL (0.2 mg/mL) infusion (not administered)  furosemide (LASIX) injection 80 mg (not administered)  potassium chloride 10 mEq in 100 mL IVPB (not administered)  potassium chloride SA (K-DUR,KLOR-CON) CR tablet 40 mEq (not administered)     Initial Impression / Assessment and Plan / ED Course  I have reviewed the triage vital signs and the nursing notes.  Pertinent labs & imaging results that were available during my care of the patient were reviewed by me and considered in my medical decision making (see chart for details).  Clinical Course   Worsening SOB, CP since Friday.  EKG shows unchanged LVH with ST depressions T-wave inversions inferior laterally  Patient is very hypertensive, dyspneic with conversation we'll start IV nitroglycerin, given IV Lasix  MOst recent cath report:  Widely patent proximal to distal LAD. Circumflex is widely patent with 70% stenosis in the mid vessel. A branch of the first obtuse marginal is totally occluded. There is 80% stenosis in the proximal portion of the second obtuse marginal. The right coronary contains eccentric 50-60% proximal narrowing.  When the coronary anatomy is compared to the most recent angiogram from 1-1/2 years ago there is progression of stenosis in the second obtuse marginal.  Severe elevation of left ventricular end-diastolic pressure to 40 mmHg. This in conjunction with the echo EF of 25-30% suggests severe systolic heart failure and the need for more aggressive medical therapy including diuresis. Left ventricular dysfunction would appear to be out of proportion to the degree of coronary disease.   Patient shows no significant edema on chest x-ray. He remains significantly hypertensive which may be driving his dyspnea. Troponin mildly elevated similar  to previous. Continue IV Lasix, IV hydralazine, IV nitroglycerin.  Discussed with Dr. Graciela Husbands cardiology will evaluate. Plan admission for hypertensive emergency and CHF.  ABG reassuring. Patient not distressed. Hold on bipap at this time.  CRITICAL CARE Performed by: Glynn Octave Total critical care time: 40 minutes Critical care time was exclusive of separately billable procedures and treating other patients. Critical care was necessary to treat or prevent imminent or life-threatening deterioration. Critical care was time spent personally by me on the following activities: development of treatment plan with patient and/or surrogate as well as nursing, discussions with consultants, evaluation of patient's response to treatment, examination of patient, obtaining history from patient or surrogate, ordering and performing treatments and interventions, ordering and review of laboratory studies, ordering and review of radiographic studies, pulse oximetry and re-evaluation of patient's condition.  Final Clinical Impressions(s) / ED Diagnoses   Final diagnoses:  Hypertensive emergency  Acute on chronic congestive heart failure, unspecified congestive heart failure type [facility])    New Prescriptions New Prescriptions   No medications on file     Glynn Octave, MD 08/15/16 1811

## 2016-08-15 NOTE — Progress Notes (Signed)
Patient with complaints of 10/10 headache. MD paged and notified, new orders received and implemented will continue to monitor.

## 2016-08-15 NOTE — H&P (Addendum)
History & Physical    Patient ID: [name] MRN: [id number], DOB/AGE: 50-May-1967   Admit date: 08/15/2016  Primary Physician: Concepcion Living, NP Primary Cardiologist: Dr. [name]  History of Present Illness    [name] is a 50 y.o. male with past medical history of of CAD (cath 11/2015 showing 90% OM1, occluded ramus, 50% LAD, 70% LCx, 80% distal LAD, 65% prox to mid RCA on medical management due to CKD), medication noncompliance, HTN, Stage 3 CKD and chronic combined systolic and diastolic CHF (EF 40-98% by echo in 04/2016) who presented to [facility] ED on 08/15/2016 for evaluation of chest discomfort.   Patient reports his chest pain started on Friday. Notes it was worse this morning when he awoke to go to the restroom. He says it feels like a pressure along his left pectoral region. Also notes worsening dyspnea with exertion for the past several days. He can usually walk 200+ feet without symptoms but this has worsened that he cannot walk from room to room at home without symptoms. Denies any orthopnea or PND but notes he sleeps in a recliner on most occasions.   Says he is unable to afford medications at times due to not having a stable job. Is awaiting Medicaid. Says he has been as complaint as possible with his medications despite this but does miss doses. Unable to take Hydralazine as prescribed due to compliance with QID dosing.  Current anti-hypertensive medications include Amlodipine 10mg  daily, Lasix 80mg  BID, Hydralazine 100 Q6H, Imdur 30mg  daily, Labetalol 200mg  BID, and Lisinopril 40mg  daily.    Labs show a WBC of 6.0, Hgb 16.2, and platelets 281. K+ 2.8. Creatinine 1.74. BNP 851. Initial troponin 0.09. CXR shows no acute abnormality with mild to moderate bronchitic changes. EKG shows NSR, HR 67, with LVH and repolarization abnormalities noted.   Past Medical History    Past Medical History:  Diagnosis Date  . Acute on chronic combined systolic and diastolic  congestive heart failure, NYHA class 3 (HCC) 12/16/2015  . CAD (coronary artery disease)    Small vessel with occluded small OM and nonobstructive large vessel disease (cath 02/2014)  . Cardiomyopathy, nonischemic (HCC) 05/03/2016  . CHF (congestive heart failure) (HCC)   . CKD (chronic kidney disease)   . Coronary atherosclerosis due to lipid rich plaque 02/18/2009   Small OM occlusion with collaterals, 60% RCA, 40% LAD. April 2015    . History of syncope 08/2008   Secondary to hypertension  . Hypertension, essential, benign    a. renal art [place] (6/14): no RAS  . Hypokalemia 05/03/2016  . Noncompliance   . Nonischemic cardiomyopathy (HCC)    EF 45%  . Obesity   . Sleep apnea   . Tobacco abuse     Past Surgical History:  Procedure Laterality Date  . CARDIAC CATHETERIZATION N/A 12/22/2015   Procedure: Left Heart Cath and Coronary Angiography;  Surgeon: Lyn Records, MD;  Location: [facility] INVASIVE CV LAB;  Service: Cardiovascular;  Laterality: N/A;  . LEFT HEART CATHETERIZATION WITH CORONARY ANGIOGRAM N/A 02/21/2014   Procedure: LEFT HEART CATHETERIZATION WITH CORONARY ANGIOGRAM;  Surgeon: Kathleene Hazel, MD;  Location: [facility] CATH LAB;  Service: Cardiovascular;  Laterality: N/A;  . None       Allergies  Allergies  Allergen Reactions  . Penicillins Other (See Comments)    Patient states that it makes his throat itch really bad, and feels like it is closing up.   . Shrimp [Shellfish  Allergy]     Throat swells     Home Medications    Prior to Admission medications   Medication Sig Start Date End Date Taking? Authorizing Provider  amLODipine (NORVASC) 10 MG tablet Take 1 tablet (10 mg total) by mouth daily. 05/03/16  Yes Leone BrandLaura R Ingold, NP  aspirin EC 81 MG tablet Take 1 tablet (81 mg total) by mouth daily. 12/23/15  Yes Costin Otelia SergeantM Gherghe, MD  atorvastatin (LIPITOR) 80 MG tablet Take 1 tablet (80 mg total) by mouth daily at 6 PM. 12/23/15  Yes Costin Otelia SergeantM Gherghe, MD  furosemide (LASIX) 80  MG tablet Take 1 tablet (80 mg total) by mouth 2 (two) times daily. 07/19/16 10/17/16 Yes Lewayne BuntingBrian S Crenshaw, MD  hydrALAZINE (APRESOLINE) 100 MG tablet Take 1 tablet (100 mg total) by mouth every 6 (six) hours. 05/10/16  Yes Rosalio MacadamiaLori C Gerhardt, NP  isosorbide mononitrate (IMDUR) 30 MG 24 hr tablet Take 1 tablet (30 mg total) by mouth daily. 04/08/16  Yes Lewayne BuntingBrian S Crenshaw, MD  labetalol (NORMODYNE) 200 MG tablet Take 1 tablet (200 mg total) by mouth 2 (two) times daily. 07/19/16  Yes Lewayne BuntingBrian S Crenshaw, MD  lisinopril (PRINIVIL,ZESTRIL) 40 MG tablet Take 1 tablet (40 mg total) by mouth daily. 12/23/15  Yes Costin Otelia SergeantM Gherghe, MD  Multiple Vitamin (MULTIVITAMIN WITH MINERALS) TABS tablet Take 1 tablet by mouth daily.   Yes Historical Provider, MD  nitroGLYCERIN (NITROSTAT) 0.4 MG SL tablet Place 1 tablet (0.4 mg total) under the tongue every 5 (five) minutes as needed. For chest pain 12/23/15  Yes Costin Otelia SergeantM Gherghe, MD  potassium chloride 20 MEQ TBCR Take 60 mEq by mouth daily. 05/03/16  Yes Leone BrandLaura R Ingold, NP    Family History    Family History  Problem Relation Age of Onset  . Lupus Mother   . Heart disease Maternal Grandfather   . Coronary artery disease Neg Hx     Social History    Social History   Social History  . Marital status: Single    Spouse name: N/A  . Number of children: 5  . Years of education: N/A   Occupational History  . [employer]   .  [employer]   Social History Main Topics  . Smoking status: Former Smoker    Packs/day: 0.10    Years: 32.00    Types: Cigarettes  . Smokeless tobacco: Never Used     Comment: Started smoking at age [age](1980).  Cutting back  . Alcohol use No  . Drug use: No  . Sexual activity: Not Currently   Other Topics Concern  . Not on file   Social History Narrative   Lives with girlfriend.     Review of Systems    General:  No chills, fever, night sweats or weight changes.  Cardiovascular:  No edema, orthopnea, palpitations, paroxysmal  nocturnal dyspnea. Positive for chest pain and dyspnea with exertion.  Dermatological: No rash, lesions/masses Respiratory: No cough, Positive for dyspnea Urologic: No hematuria, dysuria Abdominal:   No nausea, vomiting, diarrhea, bright red blood per rectum, melena, or hematemesis Neurologic:  No visual changes, wkns, changes in mental status. All other systems reviewed and are otherwise negative except as noted above.  Physical Exam    Blood pressure (!) 199/105, pulse 67, temperature 98.6 F (37 C), temperature source Oral, resp. rate 18, SpO2 98 %.  General: Well developed, obese AA,male appearing in no acute distress. Head: Normocephalic, atraumatic, sclera non-icteric, no xanthomas, nares  are without discharge. Neck: No carotid bruits. JVD >10 Lungs: Respirations regular and unlabored, without wheezes or rales.  Heart: Regular rate and rhythm. + S4.  No murmur, no rubs, or gallops appreciated. Abdomen: Soft, non-tender, non-distended with normoactive bowel sounds. No hepatomegaly. No rebound/guarding. No obvious abdominal masses. Msk:  Strength and tone appear normal for age. No joint deformities or effusions. Extremities: No clubbing or cyanosis. No edema.  Distal pedal pulses are 2+ bilaterally. Neuro: Alert and oriented X 3. Moves all extremities spontaneously. No focal deficits noted. Psych:  Responds to questions appropriately with a normal affect. Skin: No rashes or lesions noted  Labs    Troponin [facility] Test)  Recent Labs  08/15/16 0800  TROPIPOC 0.07    Recent Labs  08/15/16 0735  TROPONINI 0.09*   Lab Results  Component Value Date   WBC 6.0 08/15/2016   HGB 16.2 08/15/2016   HCT 47.7 08/15/2016   MCV 90.5 08/15/2016   PLT 281 08/15/2016    Recent Labs Lab 08/15/16 0735  NA 134*  K 2.8*  CL 97*  CO2 26  BUN 13  CREATININE 1.74*  CALCIUM 9.0  GLUCOSE 126*   Lab Results  Component Value Date   CHOL 176 01/19/2016   HDL 33 (L)  01/19/2016   LDLCALC 108 01/19/2016   TRIG 174 (H) 01/19/2016   Lab Results  Component Value Date   DDIMER  11/12/2008    0.27        AT THE INHOUSE ESTABLISHED CUTOFF VALUE OF 0.48 ug/mL FEU, THIS ASSAY HAS BEEN DOCUMENTED IN THE LITERATURE TO HAVE     Brain Natriuretic Peptide  Date/Time Value Ref Range Status  05/10/2016 09:43 AM 557.0 (H) <100 pg/mL Final    Comment:      BNP levels increase with age in the general population with the highest values seen in individuals greater than 20 years of age. Reference: Earlyne Iba Cardiol 2002; 70:962-83.      B Natriuretic Peptide  Date/Time Value Ref Range Status  08/15/2016 07:35 AM 851.0 (H) 0.0 - 100.0 pg/mL Final  04/30/2016 02:57 AM 1,257.6 (H) 0.0 - 100.0 pg/mL Final  03/21/2016 11:12 AM 867.1 (H) 0.0 - 100.0 pg/mL Final   Pro B Natriuretic peptide (BNP)  Date/Time Value Ref Range Status  09/08/2014 06:45 AM 1,623.0 (H) 0 - 125 pg/mL Final  01/24/2014 12:15 PM 2,207.0 (H) 0 - 125 pg/mL Final   No results for input(s): INR in the last 72 hours.    Radiology Studies    Dg Chest 2 View  Result Date: 08/15/2016 CLINICAL DATA:  Chest pain for the past 2 days. Shortness of breath for several days. EXAM: CHEST  2 VIEW COMPARISON:  04/30/2016. FINDINGS: Enlarged cardiac silhouette with improvement. Clear lungs. Mild to moderate central peribronchial thickening, also improved. Thoracic and upper lumbar spine degenerative changes. IMPRESSION: No acute abnormality. Mild to moderate bronchitic changes with improvement. Electronically Signed   By: Beckie Salts M.D.   On: 08/15/2016 08:31    EKG & Cardiac Imaging    EKG: NSR, HR 67, with LVH and repolarization abnormalities noted.   ECHOCARDIOGRAM: 04/2016 Study Conclusions - Left ventricle: The cavity size was moderately dilated. Wall   thickness was increased in a pattern of moderate LVH. There was   mild concentric hypertrophy. Systolic function was moderately   reduced.  The estimated ejection fraction was in the range of 35%   to 40%. Diffuse hypokinesis. Doppler parameters  are consistent   with both elevated ventricular end-diastolic filling pressure and   elevated left atrial filling pressure. - Left atrium: The atrium was mildly dilated. - Atrial septum: No defect or patent foramen ovale was identified.   Cardiac Catheterization: 11/2015 1. Ost 1st Mrg to 1st Mrg lesion, 90% stenosed. 2. Lat Ramus lesion, 100% stenosed. 3. Ost LAD to Mid LAD lesion, 50% stenosed. 4. Prox Cx to Mid Cx lesion, 70% stenosed. 5. Dist LAD lesion, 80% stenosed. 6. Prox RCA to Mid RCA lesion, 65% stenosed.    Widely patent proximal to distal LAD. Circumflex is widely patent with 70% stenosis in the mid vessel. A branch of the first obtuse marginal is totally occluded. There is 80% stenosis in the proximal portion of the second obtuse marginal. The right coronary contains eccentric 50-60% proximal narrowing.  When the coronary anatomy is compared to the most recent angiogram from 1-1/2 years ago there is progression of stenosis in the second obtuse marginal.  Severe elevation of left ventricular end-diastolic pressure to 40 mmHg. This in conjunction with the echo EF of 25-30% suggests severe systolic heart failure and the need for more aggressive medical therapy including diuresis. Left ventricular dysfunction would appear to be out of proportion to the degree of coronary disease.   Recommendation:   With significant kidney impairment, PCI on the obtuse marginal could be considered at a later date. If angina is not present, I will continue to treat medically.  Aggressive medical therapy for systolic heart failure should include aggressive blood pressure control and diuresis.  80 cc of contrast used during the diagnostic procedure. Gentle IV hydration will be given over the next 24 hours. Follow-up kidney function in a.m.   Assessment & Plan        Signed, Ellsworth Lennox, PA-C 08/15/2016, 1:05 PM Pager: [phone]  Hypertensive crisis  Positive troponin  Ischemic and nonischemic cardiomyopathy  Chronic systolic and diastolic heart failure class III  Untreated sleep apnea  Chronic renal insufficiency grade 3  Hypokalemia  Obesity  Noncompliance/financial issues    Patient seen interviewed and examined in conjunction witB Strader. Note is modified  The patient is poorly controlled hypertension and has been poor poorly compliant with medications in the long-term as well as the short term. He presents with chest discomfort and worsening shortness of breath and was found to have blood pressure systolic of greater than 200. BNP was 850 and his potassium was low at 2.8.  On a good day he can walk 100 feet; he cannot walk a flight of stairs. He has chronic 2-3 pillow orthopnea which is worsened of late. He has not had significant edema.  Echo and catheterization over the last year and had ejection fractions measured 25-35% LVEDP was 40 @ catheterization    His hypertension is long-standing and has been coupled on numerous occasions although back to 2012 (earliest data) with hypokalemia. I wonder whether there could be a component of hyperaldosteronism;  we will check a renin aldosterone ratio and begin him on spironolactone.  Cycle enzymes  Resume medications decreasing hydralazine frequency to twice a day and increasing his labetalol dose 200--400 to try to improve compliance  Increase Imdur 30--60  Case manager consultation to help with medications and Medicaid application  K repletion   When available he needs CPAP therapy

## 2016-08-16 ENCOUNTER — Encounter (HOSPITAL_COMMUNITY): Payer: Self-pay | Admitting: Physician Assistant

## 2016-08-16 DIAGNOSIS — Z91199 Patient's noncompliance with other medical treatment and regimen due to unspecified reason: Secondary | ICD-10-CM

## 2016-08-16 DIAGNOSIS — I25119 Atherosclerotic heart disease of native coronary artery with unspecified angina pectoris: Secondary | ICD-10-CM

## 2016-08-16 DIAGNOSIS — I5042 Chronic combined systolic (congestive) and diastolic (congestive) heart failure: Secondary | ICD-10-CM

## 2016-08-16 DIAGNOSIS — R7989 Other specified abnormal findings of blood chemistry: Secondary | ICD-10-CM

## 2016-08-16 DIAGNOSIS — I160 Hypertensive urgency: Principal | ICD-10-CM

## 2016-08-16 DIAGNOSIS — I10 Essential (primary) hypertension: Secondary | ICD-10-CM

## 2016-08-16 DIAGNOSIS — G4730 Sleep apnea, unspecified: Secondary | ICD-10-CM | POA: Diagnosis present

## 2016-08-16 DIAGNOSIS — Z9119 Patient's noncompliance with other medical treatment and regimen: Secondary | ICD-10-CM

## 2016-08-16 DIAGNOSIS — N183 Chronic kidney disease, stage 3 (moderate): Secondary | ICD-10-CM

## 2016-08-16 LAB — BASIC METABOLIC PANEL
Anion gap: 11 (ref 5–15)
BUN: 18 mg/dL (ref 6–20)
CHLORIDE: 99 mmol/L — AB (ref 101–111)
CO2: 25 mmol/L (ref 22–32)
CREATININE: 2.1 mg/dL — AB (ref 0.61–1.24)
Calcium: 9.1 mg/dL (ref 8.9–10.3)
GFR calc non Af Amer: 35 mL/min — ABNORMAL LOW (ref >60)
GFR, EST AFRICAN AMERICAN: 41 mL/min — AB (ref >60)
Glucose, Bld: 119 mg/dL — ABNORMAL HIGH (ref 65–99)
Potassium: 3 mmol/L — ABNORMAL LOW (ref 3.5–5.1)
Sodium: 135 mmol/L (ref 135–145)

## 2016-08-16 LAB — TROPONIN I
Troponin I: 0.07 ng/mL (ref <0.03)
Troponin I: 0.08 ng/mL (ref <0.03)

## 2016-08-16 LAB — CBC
HCT: 42.8 % (ref 39.0–52.0)
Hemoglobin: 14.2 g/dL (ref 13.0–17.0)
MCH: 30.3 pg (ref 26.0–34.0)
MCHC: 33.2 g/dL (ref 30.0–36.0)
MCV: 91.3 fL (ref 78.0–100.0)
PLATELETS: 259 10*3/uL (ref 150–400)
RBC: 4.69 MIL/uL (ref 4.22–5.81)
RDW: 13.5 % (ref 11.5–15.5)
WBC: 5.3 10*3/uL (ref 4.0–10.5)

## 2016-08-16 MED ORDER — POTASSIUM CHLORIDE CRYS ER 20 MEQ PO TBCR
40.0000 meq | EXTENDED_RELEASE_TABLET | Freq: Once | ORAL | Status: AC
  Administered 2016-08-16: 40 meq via ORAL
  Filled 2016-08-16: qty 2

## 2016-08-16 MED ORDER — SPIRONOLACTONE 25 MG PO TABS
25.0000 mg | ORAL_TABLET | Freq: Every day | ORAL | Status: DC
  Administered 2016-08-16: 25 mg via ORAL
  Filled 2016-08-16: qty 1

## 2016-08-16 MED ORDER — LABETALOL HCL 200 MG PO TABS: 400.0000 mg | ORAL_TABLET | Freq: Two times a day (BID) | ORAL | 3 refills | Status: DC

## 2016-08-16 MED ORDER — POTASSIUM CHLORIDE ER 20 MEQ PO TBCR: 60.0000 meq | EXTENDED_RELEASE_TABLET | Freq: Every day | ORAL | 3 refills | Status: DC

## 2016-08-16 MED ORDER — BENZONATATE 100 MG PO CAPS
100.0000 mg | ORAL_CAPSULE | Freq: Two times a day (BID) | ORAL | Status: DC
  Administered 2016-08-16: 100 mg via ORAL
  Filled 2016-08-16: qty 1

## 2016-08-16 MED ORDER — HYDRALAZINE HCL 100 MG PO TABS: 100.0000 mg | ORAL_TABLET | Freq: Two times a day (BID) | ORAL | 6 refills | Status: DC

## 2016-08-16 MED ORDER — AMLODIPINE BESYLATE 10 MG PO TABS: 10.0000 mg | ORAL_TABLET | Freq: Every day | ORAL | 3 refills | Status: DC

## 2016-08-16 MED ORDER — SPIRONOLACTONE 25 MG PO TABS: 25.0000 mg | ORAL_TABLET | Freq: Every day | ORAL | 6 refills | Status: DC

## 2016-08-16 MED FILL — POTASSIUM CL ER 20 MEQ TAB: 20 | 30 days supply | Qty: 90 | Fill #0

## 2016-08-16 MED FILL — SPIRONOLACTONE 25 MG TABLET: 25 | 30 days supply | Qty: 30 | Fill #0

## 2016-08-16 MED FILL — AMLODIPINE BESYLATE 10 MG T: 10 | 30 days supply | Qty: 30 | Fill #0

## 2016-08-16 MED FILL — LABETALOL HCL 200 MG TABLET: 200 | 30 days supply | Qty: 120 | Fill #0

## 2016-08-16 NOTE — Discharge Summary (Signed)
Discharge Summary    Patient ID: [name],  MRN: [id number], DOB/AGE: 1966-03-31 50 y.o.  Admit date: 08/15/2016 Discharge date: 08/16/2016  Primary Care Provider: Concepcion Living Primary Cardiologist: Dr. [name]  Discharge Diagnoses   Principal Problem:   Hypertensive urgency, malignant Active Problems:   Diffuse Moderate to Severe CAD   Elevated troponin   CKD (chronic kidney disease), stage III   Chronic combined systolic (congestive) and diastolic (congestive) heart failure (HCC)   Noncompliance   Sleep apnea   Allergies Allergies  Allergen Reactions  . Penicillins Other (See Comments)    Patient states that it makes his throat itch really bad, and feels like it is closing up.   . Shrimp [Shellfish Allergy]     Throat swells     History of Present Illness     [name] is a 50 y.o. male with past medical history of of CAD (cath 11/2015 showing 90% OM1, occluded ramus, 50% LAD, 70% LCx, 80% distal LAD, 65% prox to mid RCA on medical management due to CKD), medication noncompliance, HTN, Stage 3 CKD and chronic combined systolic and diastolic CHF (EF 40-98% by echo in 04/2016) who presented to [facility] ED on 08/15/2016 for evaluation of chest discomfort and found to have hypertensive crisis.   Patient reports his chest pain started on Friday 08/13/16. Notes it was worse this morning when he awoke to go to the restroom. He says it feels like a pressure along his left pectoral region. Also notes worsening dyspnea with exertion for the past several days. He can usually walk 200+ feet without symptoms but this has worsened that he cannot walk from room to room at home without symptoms. Denies any orthopnea or PND but notes he sleeps in a recliner on most occasions.   Says he is unable to afford medications at times due to not having a stable job. Is awaiting Medicaid. Says he has been as complaint as possible with his medications despite this but does miss  doses. Unable to take Hydralazine as prescribed due to compliance with QID dosing.  Anti-hypertensive medications include Amlodipine 10mg  daily, Lasix 80mg  BID, Hydralazine 100 Q6H, Imdur 30mg  daily, Labetalol 200mg  BID, and Lisinopril 40mg  daily.    Labs show a WBC of 6.0, Hgb 16.2, and platelets 281. K+ 2.8. Creatinine 1.74. BNP 851. Initial troponin 0.09. CXR shows no acute abnormality with mild to moderate bronchitic changes. EKG shows NSR, HR 67, with LVH and repolarization abnormalities noted.    Hospital Course     Consultants: none  Chest pain: likely secondary to uncontrolled hypertension. Now resolved on nitro gtt. This was weaned off and he was ambulated and he did well. He also has known CAD treated medically. Per last cath note, possible PCI to OM can be considered if has continued angina  Hypertensive crisis: The patient is poorly controlled hypertension and has been poor poorly compliant with medications in the long-term as well as the short term. His hypertension is long-standing and has been coupled on numerous occasions although back to 2012 (earliest data) with hypokalemia. There was a question of possible  hyperaldosteronism and renin/aldo ratio was ordered but still pending (also patient on ACE which can skew test). He was started on spironolactone. His home medications were resumed decreasing hydralazine frequency to twice a day and increasing his labetalol dose 200--> 400 to try to improve compliance.   Current BP meds: Amlodipine 10mg  daily Lasix 80mg  BID Imdur 30mg  daily  Labetalol 400mg  BID. Lisinopril 40mg  daily Spiro 25 mg daily  ** would check a BMET in 1 week with addition of spiro. He has chronic hypokalemia so hopefully this will help  Hypertensive heart disease with heart failure: BNP 850. CXR with bronchitic changes. Appears relatively euvolemic. Continued on Lasix 80mg  BID  Chronic C S/D CHF: with dilated cardiomyopathy thought to be out of  proportion with his multivessel CAD. Continue lasix, ACE and BB. Cleda DaubSpiro added this admission .  Positive troponin: mildly positive and flat c/w demand ischemia  Chronic renal insufficiency grade 3: creat 1.74--> 2.10 today. BMET in 1 week   Untreated sleep apnea: CPAP as tolerated    Noncompliance/financial issues: this has been ongoing. He doesn't have money to pay for meds. Medicaid coverage is pending. [facility]  Obesity: Body mass index is 32.44 kg/m.   The patient has had an uncomplicated hospital course and is recovering well. He has been seen by Dr. Herbie BaltimoreHarding today and deemed ready for discharge home. All follow-up appointments have been scheduled. Discharge medications are listed below.  _____________  Discharge Vitals Blood pressure (!) 161/95, pulse 78, temperature 98.2 F (36.8 C), temperature source Oral, resp. rate 19, height 5\' 7"  (1.702 m), weight 207 lb 1.6 oz (93.9 kg), SpO2 97 %.  Filed Weights   08/15/16 1725 08/16/16 0518  Weight: 207 lb 12.8 oz (94.3 kg) 207 lb 1.6 oz (93.9 kg)    Labs & Radiologic Studies     CBC  Recent Labs  08/15/16 0735 08/16/16 0601  WBC 6.0 5.3  HGB 16.2 14.2  HCT 47.7 42.8  MCV 90.5 91.3  PLT 281 259   Basic Metabolic Panel  Recent Labs  08/15/16 0735 08/16/16 0601  NA 134* 135  K 2.8* 3.0*  CL 97* 99*  CO2 26 25  GLUCOSE 126* 119*  BUN 13 18  CREATININE 1.74* 2.10*  CALCIUM 9.0 9.1   Liver Function Tests No results for input(s): AST, ALT, ALKPHOS, BILITOT, PROT, ALBUMIN in the last 72 hours. No results for input(s): LIPASE, AMYLASE in the last 72 hours. Cardiac Enzymes  Recent Labs  08/15/16 1828 08/15/16 2344 08/16/16 0601  TROPONINI 0.09* 0.08* 0.07*   BNP Invalid input(s): POCBNP D-Dimer No results for input(s): DDIMER in the last 72 hours. Hemoglobin A1C No results for input(s): HGBA1C in the last 72 hours. Fasting Lipid Panel No results for input(s):  CHOL, HDL, LDLCALC, TRIG, CHOLHDL, LDLDIRECT in the last 72 hours. Thyroid Function Tests No results for input(s): TSH, T4TOTAL, T3FREE, THYROIDAB in the last 72 hours.  Invalid input(s): FREET3  Dg Chest 2 View  Result Date: 08/15/2016 CLINICAL DATA:  Chest pain for the past 2 days. Shortness of breath for several days. EXAM: CHEST  2 VIEW COMPARISON:  04/30/2016. FINDINGS: Enlarged cardiac silhouette with improvement. Clear lungs. Mild to moderate central peribronchial thickening, also improved. Thoracic and upper lumbar spine degenerative changes. IMPRESSION: No acute abnormality. Mild to moderate bronchitic changes with improvement. Electronically Signed   By: Beckie SaltsSteven  Reid M.D.   On: 08/15/2016 08:31     Diagnostic Studies/Procedures    None.  _____________    Disposition   Pt is being discharged home today in good condition.  Follow-up Plans & Appointments    Follow-up Information    Concepcion LivingBERNHARDT, LINDA, NP .   Specialty:  Family Medicine Contact information: [address] [place] [address] [phone]        [facility]. Go on 09/02/2016.   Why:  @ 1:45 pm for hospital f/u with Gwinda Passe NP. Pt may use the [facility] and [facility] for medications. Cost ranges from $4.00-$10.00. Address [address] [place] [address] [phone] Contact information: [address] [place] [address]       Olga Millers, MD .   Specialty:  Cardiology Why:  The office will call you to make an appointment with lab work in 1 week Contact information: [address] [place] [address] [phone]            Discharge Medications     Medication List    TAKE these medications   amLODipine 10 MG tablet Commonly known as:  NORVASC Take 1 tablet (10 mg total) by mouth daily.   aspirin EC 81 MG tablet Take 1 tablet (81 mg total) by mouth daily.   atorvastatin 80 MG tablet Commonly known  as:  LIPITOR Take 1 tablet (80 mg total) by mouth daily at 6 PM.   furosemide 80 MG tablet Commonly known as:  LASIX Take 1 tablet (80 mg total) by mouth 2 (two) times daily.   hydrALAZINE 100 MG tablet Commonly known as:  APRESOLINE Take 1 tablet (100 mg total) by mouth 2 (two) times daily. What changed:  when to take this   isosorbide mononitrate 30 MG 24 hr tablet Commonly known as:  IMDUR Take 1 tablet (30 mg total) by mouth daily.   labetalol 200 MG tablet Commonly known as:  NORMODYNE Take 2 tablets (400 mg total) by mouth 2 (two) times daily. What changed:  how much to take   lisinopril 40 MG tablet Commonly known as:  PRINIVIL,ZESTRIL Take 1 tablet (40 mg total) by mouth daily.   multivitamin with minerals Tabs tablet Take 1 tablet by mouth daily.   nitroGLYCERIN 0.4 MG SL tablet Commonly known as:  NITROSTAT Place 1 tablet (0.4 mg total) under the tongue every 5 (five) minutes as needed. For chest pain   Potassium Chloride ER 20 MEQ Tbcr Take 60 mEq by mouth daily.   spironolactone 25 MG tablet Commonly known as:  ALDACTONE Take 1 tablet (25 mg total) by mouth daily. Start taking on:  08/17/2016        Outstanding Labs/Studies   BMET  Duration of Discharge Encounter   Greater than 30 minutes including physician time.  Signed, Cline Crock PA-C 08/16/2016, 4:34 PM

## 2016-08-16 NOTE — Discharge Instructions (Signed)
Hypertension Hypertension, commonly called high blood pressure, is when the force of blood pumping through your arteries is too strong. Your arteries are the blood vessels that carry blood from your heart throughout your body. A blood pressure reading consists of a higher number over a lower number, such as 110/72. The higher number (systolic) is the pressure inside your arteries when your heart pumps. The lower number (diastolic) is the pressure inside your arteries when your heart relaxes. Ideally you want your blood pressure below 120/80. Hypertension forces your heart to work harder to pump blood. Your arteries may become narrow or stiff. Having untreated or uncontrolled hypertension can cause heart attack, stroke, kidney disease, and other problems. RISK FACTORS Some risk factors for high blood pressure are controllable. Others are not.  Risk factors you cannot control include:   Race. You may be at higher risk if you are African American.  Age. Risk increases with age.  Gender. Men are at higher risk than women before age 45 years. After age 65, women are at higher risk than men. Risk factors you can control include:  Not getting enough exercise or physical activity.  Being overweight.  Getting too much fat, sugar, calories, or salt in your diet.  Drinking too much alcohol. SIGNS AND SYMPTOMS Hypertension does not usually cause signs or symptoms. Extremely high blood pressure (hypertensive crisis) may cause headache, anxiety, shortness of breath, and nosebleed. DIAGNOSIS To check if you have hypertension, your health care provider will measure your blood pressure while you are seated, with your arm held at the level of your heart. It should be measured at least twice using the same arm. Certain conditions can cause a difference in blood pressure between your right and left arms. A blood pressure reading that is higher than normal on one occasion does not mean that you need treatment. If  it is not clear whether you have high blood pressure, you may be asked to return on a different day to have your blood pressure checked again. Or, you may be asked to monitor your blood pressure at home for 1 or more weeks. TREATMENT Treating high blood pressure includes making lifestyle changes and possibly taking medicine. Living a healthy lifestyle can help lower high blood pressure. You may need to change some of your habits. Lifestyle changes may include:  Following the DASH diet. This diet is high in fruits, vegetables, and whole grains. It is low in salt, red meat, and added sugars.  Keep your sodium intake below 2,300 mg per day.  Getting at least 30-45 minutes of aerobic exercise at least 4 times per week.  Losing weight if necessary.  Not smoking.  Limiting alcoholic beverages.  Learning ways to reduce stress. Your health care provider may prescribe medicine if lifestyle changes are not enough to get your blood pressure under control, and if one of the following is true:  You are 18-59 years of age and your systolic blood pressure is above 140.  You are 60 years of age or older, and your systolic blood pressure is above 150.  Your diastolic blood pressure is above 90.  You have diabetes, and your systolic blood pressure is over 140 or your diastolic blood pressure is over 90.  You have kidney disease and your blood pressure is above 140/90.  You have heart disease and your blood pressure is above 140/90. Your personal target blood pressure may vary depending on your medical conditions, your age, and other factors. HOME CARE INSTRUCTIONS    Have your blood pressure rechecked as directed by your health care provider.   Take medicines only as directed by your health care provider. Follow the directions carefully. Blood pressure medicines must be taken as prescribed. The medicine does not work as well when you skip doses. Skipping doses also puts you at risk for  problems.  Do not smoke.   Monitor your blood pressure at home as directed by your health care provider. SEEK MEDICAL CARE IF:   You think you are having a reaction to medicines taken.  You have recurrent headaches or feel dizzy.  You have swelling in your ankles.  You have trouble with your vision. SEEK IMMEDIATE MEDICAL CARE IF:  You develop a severe headache or confusion.  You have unusual weakness, numbness, or feel faint.  You have severe chest or abdominal pain.  You vomit repeatedly.  You have trouble breathing. MAKE SURE YOU:   Understand these instructions.  Will watch your condition.  Will get help right away if you are not doing well or get worse.   This information is not intended to replace advice given to you by your health care provider. Make sure you discuss any questions you have with your health care provider.   Document Released: 11/08/2005 Document Revised: 03/25/2015 Document Reviewed: 08/31/2013 Elsevier Interactive Patient Education 2016 Elsevier Inc[employer] DASH Eating Plan DASH stands for "Dietary Approaches to Stop Hypertension." The DASH eating plan is a healthy eating plan that has been shown to reduce high blood pressure (hypertension). Additional health benefits may include reducing the risk of type 2 diabetes mellitus, heart disease, and stroke. The DASH eating plan may also help with weight loss. WHAT DO I NEED TO KNOW ABOUT THE DASH EATING PLAN? For the DASH eating plan, you will follow these general guidelines:  Choose foods with a percent daily value for sodium of less than 5% (as listed on the food label).  Use salt-free seasonings or herbs instead of table salt or sea salt.  Check with your health care provider or pharmacist before using salt substitutes.  Eat lower-sodium products, often labeled as "lower sodium" or "no salt added."  Eat fresh foods.  Eat more vegetables, fruits, and low-fat dairy products.  Choose whole grains.  Look for the word "whole" as the first word in the ingredient list.  Choose fish and skinless chicken or turkey more often than red meat. Limit fish, poultry, and meat to 6 oz (170 g) each day.  Limit sweets, desserts, sugars, and sugary drinks.  Choose heart-healthy fats.  Limit cheese to 1 oz (28 g) per day.  Eat more home-cooked food and less restaurant, buffet, and fast food.  Limit fried foods.  Cook foods using methods other than frying.  Limit canned vegetables. If you do use them, rinse them well to decrease the sodium.  When eating at a restaurant, ask that your food be prepared with less salt, or no salt if possible. WHAT FOODS CAN I EAT? Seek help from a dietitian for individual calorie needs. Grains Whole grain or whole wheat bread. Brown rice. Whole grain or whole wheat pasta. Quinoa, bulgur, and whole grain cereals. Low-sodium cereals. Corn or whole wheat flour tortillas. Whole grain cornbread. Whole grain crackers. Low-sodium crackers. Vegetables Fresh or frozen vegetables (raw, steamed, roasted, or grilled). Low-sodium or reduced-sodium tomato and vegetable juices. Low-sodium or reduced-sodium tomato sauce and paste. Low-sodium or reduced-sodium canned vegetables.  Fruits All fresh, canned (in natural juice), or frozen fruits. Meat and Other   Protein Products Ground beef (85% or leaner), grass-fed beef, or beef trimmed of fat. Skinless chicken or turkey. Ground chicken or turkey. Pork trimmed of fat. All fish and seafood. Eggs. Dried beans, peas, or lentils. Unsalted nuts and seeds. Unsalted canned beans. Dairy Low-fat dairy products, such as skim or 1% milk, 2% or reduced-fat cheeses, low-fat ricotta or cottage cheese, or plain low-fat yogurt. Low-sodium or reduced-sodium cheeses. Fats and Oils Tub margarines without trans fats. Light or reduced-fat mayonnaise and salad dressings (reduced sodium). Avocado. Safflower, olive, or canola oils. Natural peanut or almond  butter. Other Unsalted popcorn and pretzels. The items listed above may not be a complete list of recommended foods or beverages. Contact your dietitian for more options. WHAT FOODS ARE NOT RECOMMENDED? Grains White bread. White pasta. White rice. Refined cornbread. Bagels and croissants. Crackers that contain trans fat. Vegetables Creamed or fried vegetables. Vegetables in a cheese sauce. Regular canned vegetables. Regular canned tomato sauce and paste. Regular tomato and vegetable juices. Fruits Dried fruits. Canned fruit in light or heavy syrup. Fruit juice. Meat and Other Protein Products Fatty cuts of meat. Ribs, chicken wings, bacon, sausage, bologna, salami, chitterlings, fatback, hot dogs, bratwurst, and packaged luncheon meats. Salted nuts and seeds. Canned beans with salt. Dairy Whole or 2% milk, cream, half-and-half, and cream cheese. Whole-fat or sweetened yogurt. Full-fat cheeses or blue cheese. Nondairy creamers and whipped toppings. Processed cheese, cheese spreads, or cheese curds. Condiments Onion and garlic salt, seasoned salt, table salt, and sea salt. Canned and packaged gravies. Worcestershire sauce. Tartar sauce. Barbecue sauce. Teriyaki sauce. Soy sauce, including reduced sodium. Steak sauce. Fish sauce. Oyster sauce. Cocktail sauce. Horseradish. Ketchup and mustard. Meat flavorings and tenderizers. Bouillon cubes. Hot sauce. Tabasco sauce. Marinades. Taco seasonings. Relishes. Fats and Oils Butter, stick margarine, lard, shortening, ghee, and bacon fat. Coconut, palm kernel, or palm oils. Regular salad dressings. Other Pickles and olives. Salted popcorn and pretzels. The items listed above may not be a complete list of foods and beverages to avoid. Contact your dietitian for more information. WHERE CAN I FIND MORE INFORMATION? National Heart, Lung, and Blood Institute: www.nhlbi.nih.gov/health/health-topics/topics/dash/   This information is not intended to replace  advice given to you by your health care provider. Make sure you discuss any questions you have with your health care provider.   Document Released: 10/28/2011 Document Revised: 11/29/2014 Document Reviewed: 09/12/2013 Elsevier Interactive Patient Education 2016 Elsevier Inc.  

## 2016-08-16 NOTE — Progress Notes (Signed)
Patient Name: [name] Date of Encounter: 08/16/2016     Principal Problem:   Hypertensive urgency Active Problems:   Coronary atherosclerosis due to lipid rich plaque   Accelerated hypertension   CKD (chronic kidney disease), stage III   Chronic combined systolic (congestive) and diastolic (congestive) heart failure (HCC)   Hypertensive crisis    SUBJECTIVE  Currently with HA from nitro gtt. No more chest pain. Asking if he can go home today.   CURRENT MEDS . amLODipine  10 mg Oral Daily  . aspirin EC  81 mg Oral Daily  . atorvastatin  80 mg Oral q1800  . enoxaparin (LOVENOX) injection  40 mg Subcutaneous Q24H  . furosemide  80 mg Oral BID  . hydrALAZINE  100 mg Oral BID  . isosorbide mononitrate  30 mg Oral Daily  . labetalol  400 mg Oral BID  . lisinopril  40 mg Oral Daily  . potassium chloride  40 mEq Oral Once    OBJECTIVE  Vitals:   08/15/16 1725 08/15/16 2001 08/15/16 2349 08/16/16 0518  BP: (!) 198/120 (!) 198/114 121/82 (!) 183/114  Pulse: 82 78 71 70  Resp: (!) 22 (!) 26 (!) 23 14  Temp: 98.3 F (36.8 C) 98.8 F (37.1 C) 98.9 F (37.2 C) 98 F (36.7 C)  TempSrc: Oral Oral Oral Oral  SpO2: 100% 100% 95% 97%  Weight: 207 lb 12.8 oz (94.3 kg)   207 lb 1.6 oz (93.9 kg)  Height: 5\' 7"  (1.702 m)       Intake/Output Summary (Last 24 hours) at 08/16/16 0757 Last data filed at 08/16/16 0228  Gross per 24 hour  Intake            20.43 ml  Output              625 ml  Net          -604.57 ml   Filed Weights   08/15/16 1725 08/16/16 0518  Weight: 207 lb 12.8 oz (94.3 kg) 207 lb 1.6 oz (93.9 kg)    PHYSICAL EXAM  General: Pleasant, NAD. obese Neuro: Alert and oriented X 3. Moves all extremities spontaneously. Psych: Normal affect. HEENT:  Normal  Neck: Supple without bruits or JVD. Lungs:  Resp regular and unlabored, CTA. Heart: RRR no s3, s4, or murmurs. Abdomen: Soft, non-tender, non-distended, BS + x 4.  Extremities: No clubbing,  cyanosis or edema. DP/PT/Radials 2+ and equal bilaterally.  Accessory Clinical Findings  CBC  Recent Labs  08/15/16 0735 08/16/16 0601  WBC 6.0 5.3  HGB 16.2 14.2  HCT 47.7 42.8  MCV 90.5 91.3  PLT 281 259   Basic Metabolic Panel  Recent Labs  08/15/16 0735 08/16/16 0601  NA 134* 135  K 2.8* 3.0*  CL 97* 99*  CO2 26 25  GLUCOSE 126* 119*  BUN 13 18  CREATININE 1.74* 2.10*  CALCIUM 9.0 9.1   Liver Function Tests No results for input(s): AST, ALT, ALKPHOS, BILITOT, PROT, ALBUMIN in the last 72 hours. No results for input(s): LIPASE, AMYLASE in the last 72 hours. Cardiac Enzymes  Recent Labs  08/15/16 1828 08/15/16 2344 08/16/16 0601  TROPONINI 0.09* 0.08* 0.07*    TELE  Sinus brady at night  Radiology/Studies  Dg Chest 2 View  Result Date: 08/15/2016 CLINICAL DATA:  Chest pain for the past 2 days. Shortness of breath for several days. EXAM: CHEST  2 VIEW COMPARISON:  04/30/2016. FINDINGS: Enlarged cardiac silhouette with improvement.  Clear lungs. Mild to moderate central peribronchial thickening, also improved. Thoracic and upper lumbar spine degenerative changes. IMPRESSION: No acute abnormality. Mild to moderate bronchitic changes with improvement. Electronically Signed   By: Eddie SaltsSteven  Reid M.D.   On: 08/15/2016 08:31   ECHOCARDIOGRAM: 04/2016 Study Conclusions - Left ventricle: The cavity size was moderately dilated. Wall thickness was increased in a pattern of moderate LVH. There was mild concentric hypertrophy. Systolic function was moderately reduced. The estimated ejection fraction was in the range of 35% to 40%. Diffuse hypokinesis. Doppler parameters are consistent with both elevated ventricular end-diastolic filling pressure and elevated left atrial filling pressure. - Left atrium: The atrium was mildly dilated. - Atrial septum: No defect or patent foramen ovale was identified.   Cardiac Catheterization: 11/2015 1. Ost 1st Mrg to  1st Mrg lesion, 90% stenosed. 2. Lat Ramus lesion, 100% stenosed. 3. Ost LAD to Mid LAD lesion, 50% stenosed. 4. Prox Cx to Mid Cx lesion, 70% stenosed. 5. Dist LAD lesion, 80% stenosed. 6. Prox RCA to Mid RCA lesion, 65% stenosed.   Widely patent proximal to distal LAD. Circumflex is widely patent with 70% stenosis in the mid vessel. A branch of the first obtuse marginal is totally occluded. There is 80% stenosis in the proximal portion of the second obtuse marginal. The right coronary contains eccentric 50-60% proximal narrowing.  When the coronary anatomy is compared to the most recent angiogram from 1-1/2 years ago there is progression of stenosis in the second obtuse marginal.  Severe elevation of left ventricular end-diastolic pressure to 40 mmHg. This in conjunction with the echo EF of 25-30% suggests severe systolic heart failure and the need for more aggressive medical therapy including diuresis. Left ventricular dysfunction would appear to be out of proportion to the degree of coronary disease.   Recommendation:   With significant kidney impairment, PCI on the obtuse marginal could be considered at a later date. If angina is not present, I will continue to treat medically.  Aggressive medical therapy for systolic heart failure should include aggressive blood pressure control and diuresis.  80 cc of contrast used during the diagnostic procedure. Gentle IV hydration will be given over the next 24 hours. Follow-up kidney function in a.m.   ASSESSMENT AND PLAN [name] is a 50 y.o. male with past medical history of of CAD (cath 11/2015 showing 90% OM1, occluded ramus, 50% LAD, 70% LCx, 80% distal LAD, 65% prox to mid RCA on medical management due to CKD), medication noncompliance, HTN, Stage 3 CKD and chronic combined systolic and diastolic CHF (EF 46-96%35-40% by echo in 04/2016) who [facility] for evaluation of chest discomfort and found to have  hypertensive crisis.   Chest pain: likely secondary to uncontrolled hypertension. Now resolved on nitro gtt. Will stop this and see how he does. He also has known CAD treated medially. Per last cath note, possible PCI to OM can be considered if has continued angina  Hypertensive crisis: The patient is poorly controlled hypertension and has been poor poorly compliant with medications in the long-term as well as the short term. His hypertension is long-standing and has been coupled on numerous occasions although back to 2012 (earliest data) with hypokalemia. There was a question of possible  hyperaldosteronism and renin/aldo ratio was ordered but still pending. He was started on spironolactone. His home medications were resumed decreasing hydralazine frequency to twice a day and increasing his labetalol dose 200--> 400 to try to improve  compliance.   Current BP meds: Amlodipine 10mg  daily Lasix 80mg  BID Imdur 30mg  daily Labetalol 400mg  BID. Lisinopril 40mg  daily Spiro 25 mg daily  Hypertensive heart disease with heart failure: BNP 850. CXR with bronchitic changes. Appears relatively euvolemic. Continued on Lasix 80mg  BID  Positive troponin: mildly positive and flat c/w demand ischemia  Chronic renal insufficiency grade 3: creat 1.74--> 2.10 today.   Untreated sleep apnea: CPAP as tolerated    Noncompliance/financial issues: this has been ongoing. He doesn't have money to pay for meds. Medicaid coverage is pending.   Obesity: Body mass index is 32.44 kg/m.   Signed, Cline Crock PA-C  Pager [phone]

## 2016-08-16 NOTE — Care Management Note (Addendum)
Case Management Note  Patient Details  Name: [name] MRN: [id number] Date of Birth: Apr 17, 1966  Subjective/Objective:    Pt presented for  hypertension urgency. Pt is from home and without insurance. Medicaid is pending. FC did speak with pt in regards to insurance.                Action/Plan: CM did schedule the patient a follow up appointment and placed the information on the AVS.Pt did not realize that he can utilize the pharmacy at the [facility] since he is established with [facility]. Cost will range from $4.00-$10.00. Pt is aware. CM to provide the patient with a bus pass for transportation home. No further needs from CM at this time.   Expected Discharge Date:                  Expected Discharge Plan:  Home/Self Care  In-House Referral: Financial Counselor  Discharge planning Services  CM Consult, Follow-up appt scheduled, [facility], Medication Assistance  Post Acute Care Choice:  NA Choice offered to:  NA  DME Arranged:  N/A DME Agency:  NA  HH Arranged:  NA HH Agency:  NA  Status of Service:  Completed, signed off  If discussed at Long Length of Stay Meetings, dates discussed:    Additional Comments:  Gala Lewandowsky, RN 08/16/2016, 2:45 PM

## 2016-08-16 NOTE — Progress Notes (Signed)
Pt discharged home. Discharge instructions have been gone over with the patient. IV's removed. Pt given unit number and told to call if they have any concerns regarding their discharge instructions. Jla Reynolds V, RN   

## 2016-08-19 LAB — ALDOSTERONE + RENIN ACTIVITY W/ RATIO
ALDO / PRA RATIO: 84.4 — AB (ref 0.0–30.0)
ALDOSTERONE: 37.2 ng/dL — AB (ref 0.0–30.0)
PRA LC/MS/MS: 0.441 ng/mL/hr (ref 0.167–5.380)

## 2016-08-20 ENCOUNTER — Telehealth: Payer: Self-pay | Admitting: Cardiology

## 2016-08-20 NOTE — Telephone Encounter (Signed)
Closed Encounter  °

## 2016-08-26 ENCOUNTER — Encounter: Payer: Self-pay | Admitting: *Deleted

## 2016-08-26 ENCOUNTER — Ambulatory Visit: Payer: Self-pay | Admitting: Physician Assistant

## 2016-08-26 NOTE — Progress Notes (Deleted)
Cardiology Office Note   Date:  08/26/2016   ID:  [name], DOB 12-07-1965, MRN [id number]  PCP:  Concepcion LivingBERNHARDT, LINDA, NP  Cardiologist:  Dr Rosemarie Beathrenshaw  Frantz Quattrone, PA-C   No chief complaint on file.   History of Present Illness: [name] is a 50 y.o. male with a history of OM 100%, RCA 60%, LAD 40% 11/2015 (rx medically), HTN, CKD III, S-D-CHF, OSA, noncompliance  D/c 09/25 after admit for HTN urgency  [name] presents for ***   Past Medical History:  Diagnosis Date  . CAD (coronary artery disease) 02/18/2009   Small OM occlusion with collaterals, 60% RCA, 40% LAD. April 2015    . Cardiomyopathy, nonischemic (HCC) 05/03/2016  . CHF (congestive heart failure) (HCC)   . CKD (chronic kidney disease)   . History of syncope 08/2008   Secondary to hypertension  . Hypertension, essential, benign    a. renal art US (6/14): no RAS  . Hypokalemia 05/03/2016  . Noncompliance   . Nonischemic cardiomyopathy (HCC)    EF 45%  . Obesity   . Sleep apnea   . Tobacco abuse     Past Surgical History:  Procedure Laterality Date  . CARDIAC CATHETERIZATION N/A 12/22/2015   Procedure: Left Heart Cath and Coronary Angiography;  Surgeon: Lyn RecordsHenry W Smith, MD;  [facility];  Service: Cardiovascular;  Laterality: N/A;  . LEFT HEART CATHETERIZATION WITH CORONARY ANGIOGRAM N/A 02/21/2014   Procedure: LEFT HEART CATHETERIZATION WITH CORONARY ANGIOGRAM;  Surgeon: Kathleene Hazelhristopher D McAlhany, MD;  [facility];  Service: Cardiovascular;  Laterality: N/A;  . None      Current Outpatient Prescriptions  Medication Sig Dispense Refill  . amLODipine (NORVASC) 10 MG tablet Take 1 tablet (10 mg total) by mouth daily. 90 tablet 3  . aspirin EC 81 MG tablet Take 1 tablet (81 mg total) by mouth daily. 90 tablet 3  . atorvastatin (LIPITOR) 80 MG tablet Take 1 tablet (80 mg total) by mouth daily at 6 PM. 30 tablet 1  . furosemide (LASIX) 80 MG tablet Take 1 tablet (80  mg total) by mouth 2 (two) times daily. 180 tablet 3  . hydrALAZINE (APRESOLINE) 100 MG tablet Take 1 tablet (100 mg total) by mouth 2 (two) times daily. 120 tablet 6  . isosorbide mononitrate (IMDUR) 30 MG 24 hr tablet Take 1 tablet (30 mg total) by mouth daily. 30 tablet 12  . labetalol (NORMODYNE) 200 MG tablet Take 2 tablets (400 mg total) by mouth 2 (two) times daily. 360 tablet 3  . lisinopril (PRINIVIL,ZESTRIL) 40 MG tablet Take 1 tablet (40 mg total) by mouth daily. 30 tablet 1  . Multiple Vitamin (MULTIVITAMIN WITH MINERALS) TABS tablet Take 1 tablet by mouth daily.    . nitroGLYCERIN (NITROSTAT) 0.4 MG SL tablet Place 1 tablet (0.4 mg total) under the tongue every 5 (five) minutes as needed. For chest pain 30 tablet 1  . Potassium Chloride ER 20 MEQ TBCR Take 60 mEq by mouth daily. 270 tablet 3  . spironolactone (ALDACTONE) 25 MG tablet Take 1 tablet (25 mg total) by mouth daily. 90 tablet 6   No current facility-administered medications for this visit.     Allergies:   Penicillins and Shrimp [shellfish allergy]    Social History:  The patient  reports that he has quit smoking. His smoking use included Cigarettes. He has a 3.20 pack-year smoking history. He has never used smokeless tobacco. He  reports that he does not drink alcohol or use drugs.   Family History:  The patient's family history includes Heart disease in his maternal grandfather; Lupus in his mother.    ROS:  Please see the history of present illness. All other systems are reviewed and negative.    PHYSICAL EXAM: VS:  There were no vitals taken for this visit. , BMI There is no height or weight on file to calculate BMI. GEN: Well nourished, well developed, male in no acute distress  HEENT: normal for age  Neck: no JVD, no carotid bruit, no masses Cardiac: RRR; no murmur, no rubs, or gallops Respiratory:  clear to auscultation bilaterally, normal work of breathing GI: soft, nontender, nondistended, + BS MS: no  deformity or atrophy; no edema; distal pulses are 2+ in all 4 extremities   Skin: warm and dry, no rash Neuro:  Strength and sensation are intact Psych: euthymic mood, full affect   EKG:  EKG {ACTION; IS/IS SNK:[id number]} ordered today. The ekg ordered today demonstrates ***   Recent Labs: 12/19/2015: Magnesium 1.9 01/19/2016: ALT 8 08/15/2016: B Natriuretic Peptide 851.0 08/16/2016: BUN 18; Creatinine, Ser 2.10; Hemoglobin 14.2; Platelets 259; Potassium 3.0; Sodium 135    Lipid Panel    Component Value Date/Time   CHOL 176 01/19/2016 0950   TRIG 174 (H) 01/19/2016 0950   HDL 33 (L) 01/19/2016 0950   CHOLHDL 5.3 (H) 01/19/2016 0950   VLDL 35 (H) 01/19/2016 0950   LDLCALC 108 01/19/2016 0950   LDLDIRECT 165.5 08/14/2013 1050     Wt Readings from Last 3 Encounters:  08/16/16 207 lb 1.6 oz (93.9 kg)  07/19/16 214 lb (97.1 kg)  05/10/16 214 lb (97.1 kg)     Other studies Reviewed: Additional studies/ records that were reviewed today include: ***.  ASSESSMENT AND PLAN:  1.  ***   Current medicines are reviewed at length with the patient today.  The patient {ACTIONS; HAS/DOES NOT HAVE:19233} concerns regarding medicines.  The following changes have been made:  {PLAN; NO CHANGE:13088:s}  Labs/ tests ordered today include: *** No orders of the defined types were placed in this encounter.    Disposition:   FU with ***  Signed, Theodore Demark, PA-C  08/26/2016 8:02 AM    [facility] Phone: [phone]; Fax: [phone]  This note was written with the assistance of speech recognition software. Please excuse any transcriptional errors.

## 2016-09-02 ENCOUNTER — Ambulatory Visit: Payer: Self-pay | Admitting: Family Medicine

## 2016-09-07 MED FILL — ?SPIRONOLACTONE 25 MG TABLE: 25 MG | 30 days supply | Qty: 30 | Fill #1

## 2016-09-07 MED FILL — POTASSIUM CL ER 20 MEQ TAB: 20 | 30 days supply | Qty: 90 | Fill #1

## 2016-09-07 MED FILL — ?AMLODIPINE BESYLATE 10 MG: 10 | 30 days supply | Qty: 30 | Fill #1

## 2016-09-07 MED FILL — LABETALOL HCL 200 MG TABLET: 200 | 30 days supply | Qty: 120 | Fill #1

## 2016-10-13 NOTE — Progress Notes (Deleted)
HPI: FU CAD and hypertension. Renal Dopplers June 2014 normal. Cardiac catheterization January 2017 showed a 90% first marginal, occluded ramus, 50% ost LAD, 70% circumflex, 80% distal LAD and 65% right coronary artery; EF 25-30; LVEDP 40. Echocardiogram January 2017 showed ejection fraction 20-25%, moderate left ventricular hypertrophy, restrictive filling with elevated left ventricular filling pressures, severe left atrial enlargement and moderate right atrial enlargement. Echocardiogram repeated June 2017 showed ejection fraction 35-40% and mild left atrial enlargement. Patient with history of noncompliance. Patient had aldosterone to plasma renin ratio of 84.4 in September which was elevated. Aldosterone level 37.2. Since last seen,   Current Outpatient Prescriptions  Medication Sig Dispense Refill  . amLODipine (NORVASC) 10 MG tablet Take 1 tablet (10 mg total) by mouth daily. 90 tablet 3  . aspirin EC 81 MG tablet Take 1 tablet (81 mg total) by mouth daily. 90 tablet 3  . atorvastatin (LIPITOR) 80 MG tablet Take 1 tablet (80 mg total) by mouth daily at 6 PM. 30 tablet 1  . furosemide (LASIX) 80 MG tablet Take 1 tablet (80 mg total) by mouth 2 (two) times daily. 180 tablet 3  . hydrALAZINE (APRESOLINE) 100 MG tablet Take 1 tablet (100 mg total) by mouth 2 (two) times daily. 120 tablet 6  . isosorbide mononitrate (IMDUR) 30 MG 24 hr tablet Take 1 tablet (30 mg total) by mouth daily. 30 tablet 12  . labetalol (NORMODYNE) 200 MG tablet Take 2 tablets (400 mg total) by mouth 2 (two) times daily. 360 tablet 3  . lisinopril (PRINIVIL,ZESTRIL) 40 MG tablet Take 1 tablet (40 mg total) by mouth daily. 30 tablet 1  . Multiple Vitamin (MULTIVITAMIN WITH MINERALS) TABS tablet Take 1 tablet by mouth daily.    . nitroGLYCERIN (NITROSTAT) 0.4 MG SL tablet Place 1 tablet (0.4 mg total) under the tongue every 5 (five) minutes as needed. For chest pain 30 tablet 1  . Potassium Chloride ER 20 MEQ TBCR Take  60 mEq by mouth daily. 270 tablet 3  . spironolactone (ALDACTONE) 25 MG tablet Take 1 tablet (25 mg total) by mouth daily. 90 tablet 6   No current facility-administered medications for this visit.      Past Medical History:  Diagnosis Date  . CAD (coronary artery disease) 02/18/2009   Small OM occlusion with collaterals, 60% RCA, 40% LAD. April 2015    . Cardiomyopathy, nonischemic (HCC) 05/03/2016  . CHF (congestive heart failure) (HCC)   . CKD (chronic kidney disease)   . History of syncope 08/2008   Secondary to hypertension  . Hypertension, essential, benign    a. renal art [place] (6/14): no RAS  . Hypokalemia 05/03/2016  . Noncompliance   . Nonischemic cardiomyopathy (HCC)    EF 45%  . Obesity   . Sleep apnea   . Tobacco abuse     Past Surgical History:  Procedure Laterality Date  . CARDIAC CATHETERIZATION N/A 12/22/2015   Procedure: Left Heart Cath and Coronary Angiography;  Surgeon: Lyn Records, MD;  Location[facility] INVASIVE CV LAB;  Service: Cardiovascular;  Laterality: N/A;  . LEFT HEART CATHETERIZATION WITH CORONARY ANGIOGRAM N/A 02/21/2014   Procedure: LEFT HEART CATHETERIZATION WITH CORONARY ANGIOGRAM;  Surgeon: Kathleene Hazel, MD;  Location: [facility] CATH LAB;  Service: Cardiovascular;  Laterality: N/A;  . None      Social History   Social History  . Marital status: Single    Spouse name: N/A  . Number of children: 5  .  Years of education: N/A   Occupational History  . [employer]   .  [employer]   Social History Main Topics  . Smoking status: Former Smoker    Packs/day: 0.10    Years: 32.00    Types: Cigarettes  . Smokeless tobacco: Never Used     Comment: Started smoking at age [age](1980).  Cutting back  . Alcohol use No  . Drug use: No  . Sexual activity: Not Currently   Other Topics Concern  . Not on file   Social History Narrative   Lives with girlfriend.    Family History  Problem Relation Age of Onset  . Lupus Mother   . Heart disease  Maternal Grandfather   . Coronary artery disease Neg Hx     ROS: no fevers or chills, productive cough, hemoptysis, dysphasia, odynophagia, melena, hematochezia, dysuria, hematuria, rash, seizure activity, orthopnea, PND, pedal edema, claudication. Remaining systems are negative.  Physical Exam: Well-developed well-nourished in no acute distress.  Skin is warm and dry.  HEENT is normal.  Neck is supple.  Chest is clear to auscultation with normal expansion.  Cardiovascular exam is regular rate and rhythm.  Abdominal exam nontender or distended. No masses palpated. Extremities show no edema. neuro grossly intact  ECG

## 2016-10-18 ENCOUNTER — Encounter: Payer: Self-pay | Admitting: Cardiology

## 2016-10-25 ENCOUNTER — Encounter: Payer: Self-pay | Admitting: *Deleted

## 2016-10-25 ENCOUNTER — Ambulatory Visit: Payer: Self-pay | Admitting: Cardiology

## 2016-10-25 MED FILL — LABETALOL HCL 200 MG TABLET: 200 | 30 days supply | Qty: 120 | Fill #2

## 2016-10-25 MED FILL — POTASSIUM CL ER 20 MEQ TAB: 20 | 30 days supply | Qty: 90 | Fill #2

## 2016-10-25 MED FILL — ?SPIRONOLACTONE 25 MG TABLE: 25 MG | 30 days supply | Qty: 30 | Fill #2

## 2016-10-25 MED FILL — AMLODIPINE BESYLATE 10 MG T: 10 | 30 days supply | Qty: 30 | Fill #2

## 2016-11-30 MED FILL — FUROSEMIDE 80 MG TABLET: 80 | 30 days supply | Qty: 60 | Fill #0

## 2016-11-30 MED FILL — hydrALAZINE HCL 100 MG TABS: 100 | 30 days supply | Qty: 90 | Fill #0

## 2017-01-11 MED FILL — ?FUROSEMIDE 80MG TABLET: 80 | 30 days supply | Qty: 60 | Fill #1

## 2017-01-11 MED FILL — POTASSIUM CL ER 20 MEQ TAB: 20 | 30 days supply | Qty: 90 | Fill #3

## 2017-01-11 MED FILL — AMLODIPINE BESYLATE 10 MG T: 10 | 30 days supply | Qty: 30 | Fill #3

## 2017-01-18 MED FILL — SPIRONOLACTONE 25 MG TABLET: 25 | 30 days supply | Qty: 30 | Fill #3

## 2017-01-21 ENCOUNTER — Encounter: Payer: Self-pay | Admitting: Family Medicine

## 2017-01-21 ENCOUNTER — Ambulatory Visit (INDEPENDENT_AMBULATORY_CARE_PROVIDER_SITE_OTHER): Payer: Self-pay | Admitting: Family Medicine

## 2017-01-21 VITALS — BP 148/92 | HR 79 | Temp 97.7°F | Resp 18 | Ht 67.0 in | Wt 223.0 lb

## 2017-01-21 DIAGNOSIS — E785 Hyperlipidemia, unspecified: Secondary | ICD-10-CM

## 2017-01-21 DIAGNOSIS — Z23 Encounter for immunization: Secondary | ICD-10-CM

## 2017-01-21 DIAGNOSIS — Z9189 Other specified personal risk factors, not elsewhere classified: Secondary | ICD-10-CM

## 2017-01-21 DIAGNOSIS — F17200 Nicotine dependence, unspecified, uncomplicated: Secondary | ICD-10-CM

## 2017-01-21 DIAGNOSIS — R0683 Snoring: Secondary | ICD-10-CM

## 2017-01-21 DIAGNOSIS — R635 Abnormal weight gain: Secondary | ICD-10-CM | POA: Insufficient documentation

## 2017-01-21 DIAGNOSIS — R7303 Prediabetes: Secondary | ICD-10-CM

## 2017-01-21 DIAGNOSIS — I10 Essential (primary) hypertension: Secondary | ICD-10-CM

## 2017-01-21 DIAGNOSIS — G4700 Insomnia, unspecified: Secondary | ICD-10-CM

## 2017-01-21 LAB — COMPLETE METABOLIC PANEL WITH GFR
ALBUMIN: 4.2 g/dL (ref 3.6–5.1)
ALK PHOS: 68 U/L (ref 40–115)
ALT: 32 U/L (ref 9–46)
AST: 19 U/L (ref 10–35)
BILIRUBIN TOTAL: 0.4 mg/dL (ref 0.2–1.2)
BUN: 23 mg/dL (ref 7–25)
CALCIUM: 9.1 mg/dL (ref 8.6–10.3)
CO2: 23 mmol/L (ref 20–31)
CREATININE: 1.84 mg/dL — AB (ref 0.70–1.33)
Chloride: 106 mmol/L (ref 98–110)
GFR, EST NON AFRICAN AMERICAN: 42 mL/min — AB (ref >=60)
GFR, Est African American: 48 mL/min — ABNORMAL LOW (ref >=60)
Glucose, Bld: 105 mg/dL — ABNORMAL HIGH (ref 65–99)
Potassium: 4.1 mmol/L (ref 3.5–5.3)
Sodium: 138 mmol/L (ref 135–146)
TOTAL PROTEIN: 7.3 g/dL (ref 6.1–8.1)

## 2017-01-21 LAB — TSH: TSH: 1.07 m[IU]/L (ref 0.40–4.50)

## 2017-01-21 LAB — POCT GLYCOSYLATED HEMOGLOBIN (HGB A1C): HEMOGLOBIN A1C: 5.4

## 2017-01-21 LAB — LIPID PANEL
CHOL/HDL RATIO: 6.2 ratio — AB (ref <5.0)
Cholesterol: 206 mg/dL — ABNORMAL HIGH (ref <200)
HDL: 33 mg/dL — ABNORMAL LOW (ref >40)
LDL Cholesterol: 136 mg/dL — ABNORMAL HIGH (ref <100)
Triglycerides: 185 mg/dL — ABNORMAL HIGH (ref <150)
VLDL: 37 mg/dL — ABNORMAL HIGH (ref <30)

## 2017-01-21 NOTE — Progress Notes (Signed)
Subjective:    Patient ID: [name], male    DOB: [birth year], 51 y.o.   MRN: [id number]  HPI [name], a 51 year old with a history of hypertension and prediabetes presents complaining of insomnia.  Patient presents with possible obstructive sleep apnea. Patent has a several month history of symptoms of OSA symptoms. He finds that he has a family history of sleep apnea. Patient generally gets 3 or 4 hours of sleep per night, and states they generally have poor quality. Snoring of moderate severity is present. Witnessed apneic episodes present. Nasal obstruction are not present.  Past Medical History:  Diagnosis Date  . CAD (coronary artery disease) 02/18/2009   Small OM occlusion with collaterals, 60% RCA, 40% LAD. April 2015    . Cardiomyopathy, nonischemic (HCC) 05/03/2016  . CHF (congestive heart failure) (HCC)   . CKD (chronic kidney disease)   . History of syncope 08/2008   Secondary to hypertension  . Hypertension, essential, benign    a. renal art [place] (6/14): no RAS  . Hypokalemia 05/03/2016  . Noncompliance   . Nonischemic cardiomyopathy (HCC)    EF 45%  . Obesity   . Sleep apnea   . Tobacco abuse    Social History   Social History  . Marital status: Single    Spouse name: N/A  . Number of children: 5  . Years of education: N/A   Occupational History  . [employer]   .  [employer]   Social History Main Topics  . Smoking status: Former Smoker    Packs/day: 0.10    Years: 32.00    Types: Cigarettes  . Smokeless tobacco: Never Used     Comment: Started smoking at age 26 (89).  Cutting back  . Alcohol use No  . Drug use: No  . Sexual activity: Not Currently   Other Topics Concern  . Not on file   Social History Narrative   Lives with girlfriend.   Immunization History  Administered Date(s) Administered  . Tdap 11/23/2011   Review of Systems  Constitutional: Positive for unexpected weight change (weight gain ).  Eyes: Negative.    Respiratory: Positive for apnea and shortness of breath.        Increased snoring  Cardiovascular: Negative.  Negative for chest pain, palpitations and leg swelling.  Gastrointestinal: Negative.   Endocrine: Negative for polydipsia, polyphagia and polyuria.  Genitourinary: Negative.   Musculoskeletal: Negative.   Skin: Negative.   Allergic/Immunologic: Negative.   Neurological: Negative for weakness.  Hematological: Negative.   Psychiatric/Behavioral: Negative.        Objective:   Physical Exam  Constitutional: He is oriented to person, place, and time. He appears well-developed and well-nourished. He is chemically paralyzed.  Musculoskeletal: Normal range of motion.  Lymphadenopathy:       Head (right side): No submental adenopathy present.       Head (left side): No submental adenopathy present.  Neurological: He is alert and oriented to person, place, and time. He has normal strength and normal reflexes. He displays a negative Romberg sign.  Skin: Skin is warm and dry.  Psychiatric: He has a normal mood and affect. His behavior is normal. Judgment and thought content normal.      BP (!) 148/92 (BP Location: Left Arm, Patient Position: Sitting, Cuff Size: Normal)   Pulse 79   Temp 97.7 F (36.5 C) (Oral)   Resp 18   Ht 5\' 7"  (1.702 m)  Wt 223 lb (101.2 kg)   SpO2 100%   BMI 34.93 kg/m  Assessment & Plan:  1. Essential hypertension Blood pressure is above goal on current medication regimen. Patient to follow up in cardiology for further treatment and evaluation.    2. Prediabetes Recommend a lowfat, low carbohydrate diet divided over 5-6 small meals, increase water intake to 6-8 glasses, and 150 minutes per week of cardiovascular exercise.    3. Weight gain - HgB A1c - COMPLETE METABOLIC PANEL WITH GFR - TSH  4. Insomnia, unspecified type - Split night study; Future  5. At risk for obstructive sleep apnea - Split night study; Future  6. Snoring - Split  night study; Future  7. Hyperlipidemia, unspecified hyperlipidemia type - Lipid Panel  8. Tobacco dependence Smoking cessation instruction/counseling given:  counseled patient on the dangers of tobacco use, advised patient to stop smoking, and reviewed strategies to maximize success  9. Immunization due - Pneumococcal polysaccharide vaccine 23-valent greater than or equal to 2yo subcutaneous/IM   RTC: 6 months for chronic conditions  Hollis,Lachina M, FNP

## 2017-01-21 NOTE — Progress Notes (Signed)
HPI: FU CAD and hypertension. Renal Dopplers June 2014 normal. Cardiac catheterization January 2017 showed a 90% first marginal, occluded ramus, 50% ost LAD, 70% circumflex, 80% distal LAD and 65% right coronary artery; EF 25-30; LVEDP 40. Echocardiogram repeated June 2017 showed ejection fraction 35-40% and mild left atrial enlargement. Patient with history of noncompliance. Hospitalized 9/17 with hypertensive urgency and chest pain; aldosterone level elevated at 37 and aldo/renin ratio elevated at 84.4. Pt asked to fu with primary care. Since last seen, patient has dyspnea on exertion and orthopnea. No pedal edema. Some exertional chest pain unchanged compared to previous. He is only taking hydralazine twice daily and is not taking imdur.  Current Outpatient Prescriptions  Medication Sig Dispense Refill  . amLODipine (NORVASC) 10 MG tablet Take 1 tablet (10 mg total) by mouth daily. 90 tablet 3  . aspirin EC 81 MG tablet Take 1 tablet (81 mg total) by mouth daily. 90 tablet 3  . atorvastatin (LIPITOR) 80 MG tablet Take 1 tablet (80 mg total) by mouth daily at 6 PM. 30 tablet 1  . furosemide (LASIX) 80 MG tablet Take 1 tablet (80 mg total) by mouth 2 (two) times daily. 180 tablet 3  . hydrALAZINE (APRESOLINE) 100 MG tablet Take 1 tablet (100 mg total) by mouth 2 (two) times daily. 120 tablet 6  . isosorbide mononitrate (IMDUR) 30 MG 24 hr tablet Take 1 tablet (30 mg total) by mouth daily. 30 tablet 12  . labetalol (NORMODYNE) 200 MG tablet Take 2 tablets (400 mg total) by mouth 2 (two) times daily. 360 tablet 3  . lisinopril (PRINIVIL,ZESTRIL) 40 MG tablet Take 1 tablet (40 mg total) by mouth daily. 30 tablet 1  . Multiple Vitamin (MULTIVITAMIN WITH MINERALS) TABS tablet Take 1 tablet by mouth daily.    . nitroGLYCERIN (NITROSTAT) 0.4 MG SL tablet Place 1 tablet (0.4 mg total) under the tongue every 5 (five) minutes as needed. For chest pain 30 tablet 1  . Potassium Chloride ER 20 MEQ TBCR  Take 60 mEq by mouth daily. 270 tablet 3  . spironolactone (ALDACTONE) 25 MG tablet Take 1 tablet (25 mg total) by mouth daily. 90 tablet 6   No current facility-administered medications for this visit.      Past Medical History:  Diagnosis Date  . CAD (coronary artery disease) 02/18/2009   Small OM occlusion with collaterals, 60% RCA, 40% LAD. April 2015    . Cardiomyopathy, nonischemic (HCC) 05/03/2016  . CHF (congestive heart failure) (HCC)   . CKD (chronic kidney disease)   . History of syncope 08/2008   Secondary to hypertension  . Hypertension, essential, benign    a. renal art [place] (6/14): no RAS  . Hypokalemia 05/03/2016  . Noncompliance   . Nonischemic cardiomyopathy (HCC)    EF 45%  . Obesity   . Sleep apnea   . Tobacco abuse     Past Surgical History:  Procedure Laterality Date  . CARDIAC CATHETERIZATION N/A 12/22/2015   Procedure: Left Heart Cath and Coronary Angiography;  Surgeon: Lyn Records, MD;  Location: [facility] INVASIVE CV LAB;  Service: Cardiovascular;  Laterality: N/A;  . LEFT HEART CATHETERIZATION WITH CORONARY ANGIOGRAM N/A 02/21/2014   Procedure: LEFT HEART CATHETERIZATION WITH CORONARY ANGIOGRAM;  Surgeon: Kathleene Hazel, MD;  Location: [facility] CATH LAB;  Service: Cardiovascular;  Laterality: N/A;  . None      Social History   Social History  . Marital status: Single  Spouse name: N/A  . Number of children: 5  . Years of education: N/A   Occupational History  . [employer]   .  [employer]   Social History Main Topics  . Smoking status: Former Smoker    Packs/day: 0.10    Years: 32.00    Types: Cigarettes  . Smokeless tobacco: Never Used     Comment: Started smoking at age 24 (82).  Cutting back  . Alcohol use No  . Drug use: No  . Sexual activity: Not Currently   Other Topics Concern  . Not on file   Social History Narrative   Lives with girlfriend.    Family History  Problem Relation Age of Onset  . Lupus Mother   . Heart  disease Maternal Grandfather   . Coronary artery disease Neg Hx     ROS: no fevers or chills, productive cough, hemoptysis, dysphasia, odynophagia, melena, hematochezia, dysuria, hematuria, rash, seizure activity, orthopnea, PND, pedal edema, claudication. Remaining systems are negative.  Physical Exam: Well-developed well-nourished in no acute distress.  Skin is warm and dry.  HEENT is normal.  Neck is supple. No bruits Chest is clear to auscultation with normal expansion.  Cardiovascular exam is regular rate and rhythm.  Abdominal exam nontender or distended. No masses palpated. Extremities show no edema. neuro grossly intact  ECG- sinus rhythm at a rate of 74. Left ventricular hypertrophy. Left atrial enlargement. Repolarization abnormalities. Prolonged QT interval. personally reviewed  A/P  1 hypertension-blood pressure remains elevated. Increase hydralazine to 100 mg by mouth 3 times a day. Resume isosorbide mononitrate at 60 mg daily. Follow blood pressure and advance regimen as needed. Previous aldosterone and aldosterone/renin ratio elevated. I will ask endocrinology to review for question hyperaldosteronism.  2 Coronary artery disease-continue aspirin and statin.  3 hyperlipidemia-continue statin.  4 cardiomyopathy-this is felt secondary to hypertension. We will continue with lisinopril. Continue labetalol. Continue hydralazine/nitrates. I have explained that his cardiomyopathy could improve if his blood pressure is better controlled. We stressed the importance of compliance.  5 chronic stage III kidney disease-patient had laboratories drawn recently by primary care. They will be forwarded for our records.   Olga Millers, MD

## 2017-01-24 ENCOUNTER — Other Ambulatory Visit: Payer: Self-pay | Admitting: Family Medicine

## 2017-01-24 LAB — POCT URINALYSIS DIP (DEVICE)
Bilirubin Urine: NEGATIVE
Glucose, UA: NEGATIVE mg/dL
HGB URINE DIPSTICK: NEGATIVE
Ketones, ur: NEGATIVE mg/dL
LEUKOCYTES UA: NEGATIVE
NITRITE: NEGATIVE
Protein, ur: 100 mg/dL — AB
Specific Gravity, Urine: 1.015 (ref 1.005–1.030)
UROBILINOGEN UA: 0.2 mg/dL (ref 0.0–1.0)
pH: 6.5 (ref 5.0–8.0)

## 2017-01-24 NOTE — Progress Notes (Signed)
Called, and left a message advising patient that cholesterol was still elevated and he needed to continue his atorvastatin 80mg  as directed and to take consistently. Advised to eat low fat/ low carb diet over 5 to 6 small meals daily, drink 6 to 8 glasses of water daily, and to exercise 150 minutes weekly. Advised if any question to call back to our office. Thanks!

## 2017-01-27 ENCOUNTER — Encounter: Payer: Self-pay | Admitting: Cardiology

## 2017-01-27 ENCOUNTER — Ambulatory Visit (INDEPENDENT_AMBULATORY_CARE_PROVIDER_SITE_OTHER): Payer: Self-pay | Admitting: Cardiology

## 2017-01-27 VITALS — BP 158/96 | HR 74 | Ht 67.0 in | Wt 218.4 lb

## 2017-01-27 DIAGNOSIS — E269 Hyperaldosteronism, unspecified: Secondary | ICD-10-CM

## 2017-01-27 DIAGNOSIS — I2510 Atherosclerotic heart disease of native coronary artery without angina pectoris: Secondary | ICD-10-CM

## 2017-01-27 DIAGNOSIS — I5022 Chronic systolic (congestive) heart failure: Secondary | ICD-10-CM

## 2017-01-27 DIAGNOSIS — I10 Essential (primary) hypertension: Secondary | ICD-10-CM

## 2017-01-27 MED ORDER — HYDRALAZINE HCL 100 MG PO TABS: 100.0000 mg | ORAL_TABLET | Freq: Three times a day (TID) | ORAL | 11 refills | Status: DC

## 2017-01-27 MED ORDER — ISOSORBIDE MONONITRATE ER 60 MG PO TB24: 60.0000 mg | ORAL_TABLET | Freq: Every day | ORAL | 11 refills | Status: DC

## 2017-01-27 MED FILL — ISOSORBIDE MN ER 60 MG TAB: 60 | 30 days supply | Qty: 30 | Fill #0

## 2017-01-27 MED FILL — hydrALAZINE HCL 100 MG TABS: 100 | 30 days supply | Qty: 90 | Fill #0

## 2017-01-27 NOTE — Patient Instructions (Signed)
Dr [name] has recommended making the following medication changes: 1. INCREASE Hydralazine to 100 mg THREE times daily 2. INCREASE Isosorbide to 60 mg daily  You have been referred to an Endocrinologist.  Dr [name] recommends that you schedule a follow-up appointment in 6-8 weeks.  If you need a refill on your cardiac medications before your next appointment, please call your pharmacy.

## 2017-02-16 MED FILL — LABETALOL HCL 200 MG TABLET: 200 | 30 days supply | Qty: 120 | Fill #3

## 2017-02-16 MED FILL — ?FUROSEMIDE 80MG TABLET: 80 | 30 days supply | Qty: 60 | Fill #2

## 2017-02-16 MED FILL — hydrALAZINE HCL 100 MG TABS: 100 | 30 days supply | Qty: 90 | Fill #1

## 2017-02-16 MED FILL — POTASSIUM CL ER 20 MEQ TAB: 20 | 30 days supply | Qty: 90 | Fill #4

## 2017-02-16 MED FILL — AMLODIPINE BESYLATE 10 MG T: 10 | 30 days supply | Qty: 30 | Fill #4

## 2017-02-16 MED FILL — SPIRONOLACTONE 25 MG TABLET: 25 | 30 days supply | Qty: 30 | Fill #4

## 2017-02-28 ENCOUNTER — Ambulatory Visit (HOSPITAL_BASED_OUTPATIENT_CLINIC_OR_DEPARTMENT_OTHER): Payer: Medicaid Other | Attending: Family Medicine | Admitting: Internal Medicine

## 2017-02-28 VITALS — Ht 67.0 in | Wt 213.0 lb

## 2017-02-28 DIAGNOSIS — Z9189 Other specified personal risk factors, not elsewhere classified: Secondary | ICD-10-CM

## 2017-02-28 DIAGNOSIS — G4733 Obstructive sleep apnea (adult) (pediatric): Secondary | ICD-10-CM

## 2017-02-28 DIAGNOSIS — G4700 Insomnia, unspecified: Secondary | ICD-10-CM | POA: Diagnosis present

## 2017-02-28 DIAGNOSIS — R0683 Snoring: Secondary | ICD-10-CM

## 2017-03-02 ENCOUNTER — Ambulatory Visit: Payer: Self-pay | Admitting: Endocrinology

## 2017-03-13 DIAGNOSIS — G4700 Insomnia, unspecified: Secondary | ICD-10-CM

## 2017-03-13 NOTE — Procedures (Signed)
Patient Name: [name], [name] Date: 02/28/2017 Gender: Male D.O.B: 02-17-1966 Age (years): 51 Referring Provider: Cammie Sickle Height (inches): 14 Interpreting Physician: Baird Lyons MD, ABSM Weight (lbs): 213 RPSGT: Madelon Lips BMI: 33 MRN: [id number] Neck Size: 18.00 CLINICAL INFORMATION Sleep Study Type: Split Night CPAP  Indication for sleep study: Snoring  Epworth Sleepiness Score: 6  SLEEP STUDY TECHNIQUE As per the AASM Manual for the Scoring of Sleep and Associated Events v2.3 (April 2016) with a hypopnea requiring 4% desaturations.  The channels recorded and monitored were frontal, central and occipital EEG, electrooculogram (EOG), submentalis EMG (chin), nasal and oral airflow, thoracic and abdominal wall motion, anterior tibialis EMG, snore microphone, electrocardiogram, and pulse oximetry. Continuous positive airway pressure (CPAP) was initiated when the patient met split night criteria and was titrated according to treat sleep-disordered breathing.  MEDICATIONS Medications self-administered by patient taken the night of the study : none reported  RESPIRATORY PARAMETERS Diagnostic  Total AHI (/hr): 93.1 RDI (/hr): 98.7 OA Index (/hr): 28.6 CA Index (/hr): 6.5 REM AHI (/hr): 120.0 NREM AHI (/hr): 93.0 Supine AHI (/hr): N/A Non-supine AHI (/hr): 93.15 Min O2 Sat (%): 80.00 Mean O2 (%): 93.81 Time below 88% (min): 12.7   Titration  Optimal Pressure (cm): 16 AHI at Optimal Pressure (/hr): 40.0 Min O2 at Optimal Pressure (%): 78.0 Supine % at Optimal (%): 100 Sleep % at Optimal (%): 96   SLEEP ARCHITECTURE The recording time for the entire night was 398.9 minutes.  During a baseline period of 177.8 minutes, the patient slept for 130.1 minutes in REM and nonREM, yielding a sleep efficiency of 73.2%. Sleep onset after lights out was 13.2 minutes with a REM latency of 135.5 minutes. The patient spent 41.21% of the night in stage N1 sleep, 58.41% in stage N2  sleep, 0.00% in stage N3 and 0.38% in REM.  During the titration period of 213.1 minutes, the patient slept for 166.5 minutes in REM and nonREM, yielding a sleep efficiency of 78.1%. Sleep onset after CPAP initiation was 25.4 minutes with a REM latency of 16.5 minutes. The patient spent 19.82% of the night in stage N1 sleep, 45.95% in stage N2 sleep, 0.00% in stage N3 and 34.23% in REM.  CARDIAC DATA The 2 lead EKG demonstrated sinus rhythm. The mean heart rate was 69.60 beats per minute. Other EKG findings include: PVCs.  LEG MOVEMENT DATA The total Periodic Limb Movements of Sleep (PLMS) were 2. The PLMS index was 0.40 .  IMPRESSIONS - Severe obstructive sleep apnea occurred during the diagnostic portion of the study (AHI = 93.1/hour).  - CPAP was titrated to 16 cwp with inadequate control during time available. Residual AHI 40/ hr. - Mild central sleep apnea occurred during the diagnostic portion of the study (CAI = 6.5/hour). - Severe oxygen desaturation was noted during the diagnostic portion of the study (Min O2 = 80.00%). - The patient snored with Loud snoring volume during the diagnostic portion of the study. - EKG findings include PVCs. - Clinically significant periodic limb movements did not occur during sleep.  DIAGNOSIS - Obstructive Sleep Apnea (327.23 [G47.33 ICD-10])  RECOMMENDATIONS - Trial of AutoPAP 10-20 cm H2O with a Medium size Resmed Full Face Mask AirFit F20 mask and heated humidification. - Consider return for dedicated CPAP titration study if comfortable/ effective control not obtained with AutoPAP. - Avoid alcohol, sedatives and other CNS depressants that may worsen sleep apnea and disrupt normal sleep architecture. - Sleep hygiene should be reviewed to assess  factors that may improve sleep quality. - Weight management and regular exercise should be initiated or continued.  [Electronically signed] 03/13/2017 12:15 PM  Baird Lyons MD, [place],  American Board of Sleep Medicine   NPI: [id number]  [place], American Board of Sleep Medicine  ELECTRONICALLY SIGNED ON:  03/13/2017, 12:07 PM [place] PH: [phone]   FX: [phone] [place]

## 2017-03-15 ENCOUNTER — Telehealth: Payer: Self-pay

## 2017-03-15 NOTE — Telephone Encounter (Signed)
I have faxed in form for C-pap([facility] to [facility] today 03/15/2017 @8 :25am. Thanks!

## 2017-03-16 ENCOUNTER — Ambulatory Visit: Payer: Self-pay | Admitting: Endocrinology

## 2017-03-16 ENCOUNTER — Encounter: Payer: Self-pay | Admitting: Endocrinology

## 2017-03-16 ENCOUNTER — Ambulatory Visit (INDEPENDENT_AMBULATORY_CARE_PROVIDER_SITE_OTHER): Payer: Self-pay | Admitting: Endocrinology

## 2017-03-16 VITALS — BP 150/96 | HR 86 | Ht 67.0 in | Wt 219.0 lb

## 2017-03-16 DIAGNOSIS — N183 Chronic kidney disease, stage 3 unspecified: Secondary | ICD-10-CM

## 2017-03-16 DIAGNOSIS — I161 Hypertensive emergency: Secondary | ICD-10-CM

## 2017-03-16 LAB — BASIC METABOLIC PANEL
BUN: 31 mg/dL — ABNORMAL HIGH (ref 6–23)
CALCIUM: 9.6 mg/dL (ref 8.4–10.5)
CO2: 29 mEq/L (ref 19–32)
CREATININE: 2.41 mg/dL — AB (ref 0.40–1.50)
Chloride: 102 mEq/L (ref 96–112)
GFR: 36.67 mL/min — AB (ref >60.00)
GLUCOSE: 99 mg/dL (ref 70–99)
Potassium: 4.8 mEq/L (ref 3.5–5.1)
SODIUM: 136 meq/L (ref 135–145)

## 2017-03-16 NOTE — Patient Instructions (Addendum)
blood tests are requested for you today.  We'll let you know about the results. Based on the results, we will probably need to do a CT scan. Based on that result, you may need "venous sampling" (drawing blood from the vein in the abdomen), or to discuss with a surgery specialist.

## 2017-03-16 NOTE — Progress Notes (Signed)
Subjective:    Patient ID: [name], male    DOB: 1966-08-03, 51 y.o.   MRN: [id number]  HPI Pt is referred by Dr [name], for hyperaldosteronemia.  Pt was first noted to have hypokalemia in 2010.  he has slight cramps of the legs, in the context of walking, but no assoc assoc numbness.  he has no h/o renal disease, licorice ingestion, chemotx, special diet, steroids, adrenal disease, malabsorption, herbal supplements, or hypomagnesemia.  He has never had adrenal imaging. He takes ACEI, aldactone, and 3 more BP meds.   Past Medical History:  Diagnosis Date  . CAD (coronary artery disease) 02/18/2009   Small OM occlusion with collaterals, 60% RCA, 40% LAD. April 2015    . Cardiomyopathy, nonischemic (HCC) 05/03/2016  . CHF (congestive heart failure) (HCC)   . CKD (chronic kidney disease)   . History of syncope 08/2008   Secondary to hypertension  . Hypertension, essential, benign    a. renal art [place] (6/14): no RAS  . Hypokalemia 05/03/2016  . Noncompliance   . Nonischemic cardiomyopathy (HCC)    EF 45%  . Obesity   . Sleep apnea   . Tobacco abuse     Past Surgical History:  Procedure Laterality Date  . CARDIAC CATHETERIZATION N/A 12/22/2015   Procedure: Left Heart Cath and Coronary Angiography;  Surgeon: Lyn Records, MD;  Location: [facility] INVASIVE CV LAB;  Service: Cardiovascular;  Laterality: N/A;  . LEFT HEART CATHETERIZATION WITH CORONARY ANGIOGRAM N/A 02/21/2014   Procedure: LEFT HEART CATHETERIZATION WITH CORONARY ANGIOGRAM;  Surgeon: Kathleene Hazel, MD;  Location: [facility] CATH LAB;  Service: Cardiovascular;  Laterality: N/A;  . None      Social History   Social History  . Marital status: Single    Spouse name: N/A  . Number of children: 5  . Years of education: N/A   Occupational History  . [employer]   .  [employer]   Social History Main Topics  . Smoking status: Former Smoker    Packs/day: 0.10    Years: 32.00    Types: Cigarettes  . Smokeless tobacco:  Never Used     Comment: Started smoking at age 60 (28).  Cutting back  . Alcohol use No  . Drug use: No  . Sexual activity: Not Currently   Other Topics Concern  . Not on file   Social History Narrative   Lives with girlfriend.    Current Outpatient Prescriptions on File Prior to Visit  Medication Sig Dispense Refill  . amLODipine (NORVASC) 10 MG tablet Take 1 tablet (10 mg total) by mouth daily. 90 tablet 3  . aspirin EC 81 MG tablet Take 1 tablet (81 mg total) by mouth daily. 90 tablet 3  . atorvastatin (LIPITOR) 80 MG tablet Take 1 tablet (80 mg total) by mouth daily at 6 PM. 30 tablet 1  . hydrALAZINE (APRESOLINE) 100 MG tablet Take 1 tablet (100 mg total) by mouth 3 (three) times daily. 90 tablet 11  . isosorbide mononitrate (IMDUR) 60 MG 24 hr tablet Take 1 tablet (60 mg total) by mouth daily. 30 tablet 11  . labetalol (NORMODYNE) 200 MG tablet Take 2 tablets (400 mg total) by mouth 2 (two) times daily. 360 tablet 3  . lisinopril (PRINIVIL,ZESTRIL) 40 MG tablet Take 1 tablet (40 mg total) by mouth daily. 30 tablet 1  . Multiple Vitamin (MULTIVITAMIN WITH MINERALS) TABS tablet Take 1 tablet by mouth daily.    . nitroGLYCERIN (  NITROSTAT) 0.4 MG SL tablet Place 1 tablet (0.4 mg total) under the tongue every 5 (five) minutes as needed. For chest pain 30 tablet 1  . Potassium Chloride ER 20 MEQ TBCR Take 60 mEq by mouth daily. 270 tablet 3  . spironolactone (ALDACTONE) 25 MG tablet Take 1 tablet (25 mg total) by mouth daily. 90 tablet 6  . furosemide (LASIX) 80 MG tablet Take 1 tablet (80 mg total) by mouth 2 (two) times daily. 180 tablet 3   No current facility-administered medications on file prior to visit.     Allergies  Allergen Reactions  . Penicillins Other (See Comments)    Patient states that it makes his throat itch really bad, and feels like it is closing up.   . Shrimp [Shellfish Allergy]     Throat swells    Family History  Problem Relation Age of Onset  .  Lupus Mother   . Heart disease Maternal Grandfather   . Coronary artery disease Neg Hx   . Adrenal disorder Neg Hx     BP (!) 150/96   Pulse 86   Ht 5\' 7"  (1.702 m)   Wt 219 lb (99.3 kg)   SpO2 98%   BMI 34.30 kg/m     Review of Systems Pt denies blurry vision, sob, edema, diarrhea, n/v, food allergies, and paresthesias. He has gained weight.  He has palpitations, heat intolerance, dry skin, urinary frequency, and difficulty with concentration.      Objective:   Physical Exam VS: see vs page GEN: no distress HEAD: head: no deformity eyes: no periorbital swelling, no proptosis external nose and ears are normal mouth: no lesion seen NECK: supple, thyroid is not enlarged CHEST WALL: no deformity LUNGS: clear to auscultation CV: reg rate and rhythm, no murmur ABD: abdomen is soft, nontender.  no hepatosplenomegaly.  not distended.  Small self-reducing ventral hernia MUSCULOSKELETAL: muscle bulk and strength are grossly normal.  no obvious joint swelling.  gait is normal and steady EXTEMITIES: no deformity.  no edema PULSES: no carotid bruit NEURO:  cn 2-12 grossly intact.   readily moves all 4's.  sensation is intact to touch on all 4's SKIN:  Normal texture and temperature.  No rash or suspicious lesion is visible.   NODES:  None palpable at the neck PSYCH: alert, well-oriented.  Does not appear anxious nor depressed.   Lab Results  Component Value Date   CREATININE 1.84 (H) 01/21/2017   BUN 23 01/21/2017   NA 138 01/21/2017   K 4.1 01/21/2017   CL 106 01/21/2017   CO2 23 01/21/2017   I have reviewed outside records, and summarized: Pt was noted to have hypokalemia, and referred here.  Cardiomyopathy with low EF was noted.      Assessment & Plan:  Renal insuff: worse.  Ref nephrol Hyperaldosteronemia: due for recheck. Cardiomyopathy: in this context, it is important to control also level.    Patient Instructions  blood tests are requested for you today.   We'll let you know about the results. Based on the results, we will probably need to do a CT scan. Based on that result, you may need "venous sampling" (drawing blood from the vein in the abdomen), or to discuss with a surgery specialist.

## 2017-03-17 DIAGNOSIS — N19 Unspecified kidney failure: Secondary | ICD-10-CM | POA: Insufficient documentation

## 2017-03-20 ENCOUNTER — Other Ambulatory Visit: Payer: Self-pay | Admitting: Endocrinology

## 2017-03-20 DIAGNOSIS — E269 Hyperaldosteronism, unspecified: Secondary | ICD-10-CM

## 2017-03-20 LAB — ALDOSTERONE + RENIN ACTIVITY W/ RATIO
ALDO / PRA Ratio: 20.4 Ratio (ref 0.9–28.9)
Aldosterone: 38 ng/dL — ABNORMAL HIGH
PRA LC/MS/MS: 1.86 ng/mL/h (ref 0.25–5.82)

## 2017-03-22 NOTE — Progress Notes (Signed)
HPI: FU CAD and hypertension. Renal Dopplers June 2014 normal. Cardiac catheterization January 2017 showed a 90% first marginal, occluded ramus, 50% ost LAD, 70% circumflex, 80% distal LAD and 65% right coronary artery; EF 25-30; LVEDP 40. Echocardiogram repeated June 2017 showed ejection fraction 35-40% and mild left atrial enlargement. Patient with history of noncompliance. Hospitalized 9/17 with hypertensive urgency and chest pain; aldosterone level elevated at 37 and aldo/renin ratio elevated at 84.4. Pt referred to endocrine. Since last seen, patient states he is feeling better. He has dyspnea after walking 500 feet and chest burning which is unchanged. No orthopnea, PND, pedal edema or syncope.  Current Outpatient Prescriptions  Medication Sig Dispense Refill  . amLODipine (NORVASC) 10 MG tablet Take 1 tablet (10 mg total) by mouth daily. 90 tablet 3  . aspirin EC 81 MG tablet Take 1 tablet (81 mg total) by mouth daily. 90 tablet 3  . atorvastatin (LIPITOR) 80 MG tablet Take 1 tablet (80 mg total) by mouth daily at 6 PM. 30 tablet 1  . hydrALAZINE (APRESOLINE) 100 MG tablet Take 1 tablet (100 mg total) by mouth 3 (three) times daily. 90 tablet 11  . isosorbide mononitrate (IMDUR) 60 MG 24 hr tablet Take 1 tablet (60 mg total) by mouth daily. 30 tablet 11  . labetalol (NORMODYNE) 200 MG tablet Take 2 tablets (400 mg total) by mouth 2 (two) times daily. 360 tablet 3  . lisinopril (PRINIVIL,ZESTRIL) 40 MG tablet Take 1 tablet (40 mg total) by mouth daily. 30 tablet 1  . Multiple Vitamin (MULTIVITAMIN WITH MINERALS) TABS tablet Take 1 tablet by mouth daily.    . nitroGLYCERIN (NITROSTAT) 0.4 MG SL tablet Place 1 tablet (0.4 mg total) under the tongue every 5 (five) minutes as needed. For chest pain 30 tablet 1  . Potassium Chloride ER 20 MEQ TBCR Take 60 mEq by mouth daily. 270 tablet 3  . spironolactone (ALDACTONE) 25 MG tablet Take 1 tablet (25 mg total) by mouth daily. 90 tablet 6  .  furosemide (LASIX) 80 MG tablet Take 1 tablet (80 mg total) by mouth 2 (two) times daily. 180 tablet 3   No current facility-administered medications for this visit.      Past Medical History:  Diagnosis Date  . CAD (coronary artery disease) 02/18/2009   Small OM occlusion with collaterals, 60% RCA, 40% LAD. April 2015    . Cardiomyopathy, nonischemic (HCC) 05/03/2016  . CHF (congestive heart failure) (HCC)   . CKD (chronic kidney disease)   . History of syncope 08/2008   Secondary to hypertension  . Hypertension, essential, benign    a. renal art [place] (6/14): no RAS  . Hypokalemia 05/03/2016  . Noncompliance   . Nonischemic cardiomyopathy (HCC)    EF 45%  . Obesity   . Sleep apnea   . Tobacco abuse     Past Surgical History:  Procedure Laterality Date  . CARDIAC CATHETERIZATION N/A 12/22/2015   Procedure: Left Heart Cath and Coronary Angiography;  Surgeon: Lyn Records, MD;  Location: [facility] INVASIVE CV LAB;  Service: Cardiovascular;  Laterality: N/A;  . LEFT HEART CATHETERIZATION WITH CORONARY ANGIOGRAM N/A 02/21/2014   Procedure: LEFT HEART CATHETERIZATION WITH CORONARY ANGIOGRAM;  Surgeon: Kathleene Hazel, MD;  Location: [facility] CATH LAB;  Service: Cardiovascular;  Laterality: N/A;  . None      Social History   Social History  . Marital status: Single    Spouse name: N/A  . Number of  children: 5  . Years of education: N/A   Occupational History  . [employer]   .  [employer]   Social History Main Topics  . Smoking status: Former Smoker    Packs/day: 0.10    Years: 32.00    Types: Cigarettes  . Smokeless tobacco: Never Used     Comment: Started smoking at age 59 (52).  Cutting back  . Alcohol use No  . Drug use: No  . Sexual activity: Not Currently   Other Topics Concern  . Not on file   Social History Narrative   Lives with girlfriend.    Family History  Problem Relation Age of Onset  . Lupus Mother   . Heart disease Maternal Grandfather   .  Coronary artery disease Neg Hx   . Adrenal disorder Neg Hx     ROS: no fevers or chills, productive cough, hemoptysis, dysphasia, odynophagia, melena, hematochezia, dysuria, hematuria, rash, seizure activity, orthopnea, PND, pedal edema, claudication. Remaining systems are negative.  Physical Exam: Well-developed well-nourished in no acute distress.  Skin is warm and dry.  HEENT is normal.  Neck is supple.  Chest is clear to auscultation with normal expansion. No wheezing. Cardiovascular exam is regular rate and rhythm. No murmur Abdominal exam nontender or distended. No masses palpated. Extremities show no edema. neuro grossly intact   A/P  1 hypertension-blood pressure has improved but remains elevated. I will increase labetalol to 500 mg twice a day.  2 hyperaldosteronism-patient is now being followed by endocrine and CT is scheduled.  3 coronary artery disease-continue aspirin and statin. Patient does have some exertional chest burning which is unchanged. We can consider repeating his catheterization if his symptoms worsen but would be higher risk for contrast nephropathy given renal insufficiency.  4 chronic stage III kidney disease-patient will need to be followed by nephrology.  5 cardiomyopathy-this is felt secondary to hypertension. Continue present medications. I have again stressed compliance. We will plan to repeat echocardiogram when he returns in 3 months.  Olga Millers, MD

## 2017-03-24 MED FILL — FUROSEMIDE 80 MG TABLET: 80 | 30 days supply | Qty: 60 | Fill #3

## 2017-03-24 MED FILL — LABETALOL HCL 200 MG TABLET: 200 | 30 days supply | Qty: 120 | Fill #4

## 2017-03-24 MED FILL — SPIRONOLACTONE 25 MG TABLET: 25 | 30 days supply | Qty: 30 | Fill #5

## 2017-03-24 MED FILL — AMLODIPINE BESYLATE 10 MG T: 10 | 30 days supply | Qty: 30 | Fill #5

## 2017-03-24 MED FILL — hydrALAZINE HCL 100 MG TABS: 100 | 30 days supply | Qty: 90 | Fill #2

## 2017-03-24 MED FILL — POTASSIUM CL ER 20 MEQ TAB: 20 | 30 days supply | Qty: 90 | Fill #5

## 2017-03-24 MED FILL — ISOSORBIDE MN ER 60 MG TAB: 60 | 30 days supply | Qty: 30 | Fill #1

## 2017-03-28 ENCOUNTER — Other Ambulatory Visit: Payer: Self-pay

## 2017-03-28 ENCOUNTER — Telehealth: Payer: Self-pay | Admitting: Endocrinology

## 2017-03-28 NOTE — Telephone Encounter (Signed)
[facility] called to let us know that patient did not show for his appointment today.

## 2017-03-29 ENCOUNTER — Encounter: Payer: Self-pay | Admitting: Cardiology

## 2017-03-29 ENCOUNTER — Ambulatory Visit (INDEPENDENT_AMBULATORY_CARE_PROVIDER_SITE_OTHER): Payer: Self-pay | Admitting: Cardiology

## 2017-03-29 VITALS — BP 132/90 | HR 78 | Ht 67.0 in | Wt 220.0 lb

## 2017-03-29 DIAGNOSIS — I420 Dilated cardiomyopathy: Secondary | ICD-10-CM

## 2017-03-29 DIAGNOSIS — I10 Essential (primary) hypertension: Secondary | ICD-10-CM

## 2017-03-29 DIAGNOSIS — E7800 Pure hypercholesterolemia, unspecified: Secondary | ICD-10-CM

## 2017-03-29 DIAGNOSIS — I2510 Atherosclerotic heart disease of native coronary artery without angina pectoris: Secondary | ICD-10-CM

## 2017-03-29 MED ORDER — LABETALOL HCL 200 MG PO TABS: 500.0000 mg | ORAL_TABLET | Freq: Two times a day (BID) | ORAL | 3 refills | Status: DC

## 2017-03-29 MED FILL — LABETALOL HCL 200 MG TABLET: 200 | 30 days supply | Qty: 180 | Fill #0

## 2017-03-29 NOTE — Patient Instructions (Signed)
Medication Instructions:   INCREASE LABETALOL TO 500 MG TWICE DAILY= 2 AND 1/2 TABLETS TWICE DAILY  Follow-Up:  Your physician recommends that you schedule a follow-up appointment in: 3 MONTHS WITH DR [name]   If you need a refill on your cardiac medications before your next appointment, please call your pharmacy.

## 2017-04-01 ENCOUNTER — Other Ambulatory Visit: Payer: Self-pay | Admitting: Endocrinology

## 2017-04-01 ENCOUNTER — Ambulatory Visit
Admission: RE | Admit: 2017-04-01 | Discharge: 2017-04-01 | Disposition: A | Discharge status: Home / Self Care | Payer: Self-pay | Source: Ambulatory Visit | Attending: Endocrinology | Admitting: Endocrinology

## 2017-04-01 DIAGNOSIS — E269 Hyperaldosteronism, unspecified: Secondary | ICD-10-CM

## 2017-04-13 ENCOUNTER — Other Ambulatory Visit: Payer: Self-pay | Admitting: Radiology

## 2017-04-13 ENCOUNTER — Other Ambulatory Visit: Payer: Self-pay | Admitting: General Surgery

## 2017-04-14 ENCOUNTER — Ambulatory Visit (HOSPITAL_COMMUNITY)
Admission: RE | Admit: 2017-04-14 | Discharge: 2017-04-14 | Disposition: A | Discharge status: Home / Self Care | Payer: Medicaid Other | Source: Ambulatory Visit | Attending: Endocrinology | Admitting: Endocrinology

## 2017-04-14 ENCOUNTER — Encounter (HOSPITAL_COMMUNITY): Payer: Self-pay

## 2017-04-14 ENCOUNTER — Other Ambulatory Visit: Payer: Self-pay | Admitting: Endocrinology

## 2017-04-14 DIAGNOSIS — E669 Obesity, unspecified: Secondary | ICD-10-CM | POA: Diagnosis not present

## 2017-04-14 DIAGNOSIS — Z87891 Personal history of nicotine dependence: Secondary | ICD-10-CM | POA: Diagnosis not present

## 2017-04-14 DIAGNOSIS — Z880 Allergy status to penicillin: Secondary | ICD-10-CM | POA: Insufficient documentation

## 2017-04-14 DIAGNOSIS — K429 Umbilical hernia without obstruction or gangrene: Secondary | ICD-10-CM | POA: Insufficient documentation

## 2017-04-14 DIAGNOSIS — I700 Atherosclerosis of aorta: Secondary | ICD-10-CM | POA: Diagnosis not present

## 2017-04-14 DIAGNOSIS — E269 Hyperaldosteronism, unspecified: Secondary | ICD-10-CM

## 2017-04-14 DIAGNOSIS — Z6834 Body mass index (BMI) 34.0-34.9, adult: Secondary | ICD-10-CM | POA: Diagnosis not present

## 2017-04-14 DIAGNOSIS — I130 Hypertensive heart and chronic kidney disease with heart failure and stage 1 through stage 4 chronic kidney disease, or unspecified chronic kidney disease: Secondary | ICD-10-CM | POA: Diagnosis not present

## 2017-04-14 DIAGNOSIS — N189 Chronic kidney disease, unspecified: Secondary | ICD-10-CM | POA: Insufficient documentation

## 2017-04-14 DIAGNOSIS — Z91013 Allergy to seafood: Secondary | ICD-10-CM | POA: Insufficient documentation

## 2017-04-14 DIAGNOSIS — G4730 Sleep apnea, unspecified: Secondary | ICD-10-CM | POA: Insufficient documentation

## 2017-04-14 DIAGNOSIS — I428 Other cardiomyopathies: Secondary | ICD-10-CM | POA: Diagnosis not present

## 2017-04-14 DIAGNOSIS — Z7982 Long term (current) use of aspirin: Secondary | ICD-10-CM | POA: Diagnosis not present

## 2017-04-14 DIAGNOSIS — I509 Heart failure, unspecified: Secondary | ICD-10-CM | POA: Diagnosis not present

## 2017-04-14 DIAGNOSIS — Z9114 Patient's other noncompliance with medication regimen: Secondary | ICD-10-CM | POA: Diagnosis not present

## 2017-04-14 DIAGNOSIS — I2510 Atherosclerotic heart disease of native coronary artery without angina pectoris: Secondary | ICD-10-CM | POA: Insufficient documentation

## 2017-04-14 HISTORY — PX: IR VENOGRAM RENAL BI: IMG682

## 2017-04-14 HISTORY — PX: IR US GUIDE VASC ACCESS RIGHT: IMG2390

## 2017-04-14 HISTORY — PX: IR VENOUS SAMPLING: IMG694

## 2017-04-14 HISTORY — PX: IR VENOGRAM ADRENAL BI: IMG685

## 2017-04-14 LAB — BASIC METABOLIC PANEL
Anion gap: 10 (ref 5–15)
BUN: 18 mg/dL (ref 6–20)
CALCIUM: 9.1 mg/dL (ref 8.9–10.3)
CO2: 24 mmol/L (ref 22–32)
CREATININE: 2 mg/dL — AB (ref 0.61–1.24)
Chloride: 100 mmol/L — ABNORMAL LOW (ref 101–111)
GFR calc Af Amer: 43 mL/min — ABNORMAL LOW (ref >60)
GFR calc non Af Amer: 37 mL/min — ABNORMAL LOW (ref >60)
Glucose, Bld: 105 mg/dL — ABNORMAL HIGH (ref 65–99)
Potassium: 3.7 mmol/L (ref 3.5–5.1)
Sodium: 134 mmol/L — ABNORMAL LOW (ref 135–145)

## 2017-04-14 LAB — CORTISOL
CORTISOL PLASMA: 21.7 ug/dL
CORTISOL PLASMA: 23.2 ug/dL
CORTISOL PLASMA: 6.1 ug/dL
CORTISOL PLASMA: 6.7 ug/dL
CORTISOL PLASMA: 6.9 ug/dL
Cortisol, Plasma: 5.1 ug/dL
Cortisol, Plasma: 58.6 ug/dL
Cortisol, Plasma: 6.3 ug/dL
Cortisol, Plasma: 6.7 ug/dL
Cortisol, Plasma: 7 ug/dL
Cortisol, Plasma: 7.8 ug/dL
Cortisol, Plasma: 7.8 ug/dL
Cortisol, Plasma: 6.4 ug/dL

## 2017-04-14 LAB — CBC
HCT: 42.4 % (ref 39.0–52.0)
Hemoglobin: 14.5 g/dL (ref 13.0–17.0)
MCH: 30.5 pg (ref 26.0–34.0)
MCHC: 34.2 g/dL (ref 30.0–36.0)
MCV: 89.1 fL (ref 78.0–100.0)
PLATELETS: 327 10*3/uL (ref 150–400)
RBC: 4.76 MIL/uL (ref 4.22–5.81)
RDW: 14.4 % (ref 11.5–15.5)
WBC: 7.4 10*3/uL (ref 4.0–10.5)

## 2017-04-14 LAB — PROTIME-INR
INR: 1
PROTHROMBIN TIME: 13.2 s (ref 11.4–15.2)

## 2017-04-14 LAB — APTT: aPTT: 32 seconds (ref 24–36)

## 2017-04-14 MED ORDER — LIDOCAINE HCL 1 % IJ SOLN
INTRAMUSCULAR | Status: AC | PRN
  Administered 2017-04-14: 5 mL

## 2017-04-14 MED ORDER — LIDOCAINE HCL 1 % IJ SOLN
INTRAMUSCULAR | Status: AC
  Filled 2017-04-14: qty 20

## 2017-04-14 MED ORDER — HYDROCODONE-ACETAMINOPHEN 5-325 MG PO TABS: 1.0000 | ORAL_TABLET | ORAL | Status: DC | PRN

## 2017-04-14 MED ORDER — SODIUM CHLORIDE 0.9 % IV SOLN
INTRAVENOUS | Status: AC | PRN
  Administered 2017-04-14: 10 mL/h via INTRAVENOUS

## 2017-04-14 MED ORDER — SODIUM CHLORIDE 0.9 % IV SOLN: INTRAVENOUS | Status: DC

## 2017-04-14 MED ORDER — IOPAMIDOL (ISOVUE-300) INJECTION 61%
INTRAVENOUS | Status: AC
  Administered 2017-04-14: 20 mL
  Filled 2017-04-14: qty 100

## 2017-04-14 MED ORDER — LIDOCAINE HCL (PF) 1 % IJ SOLN
INTRAMUSCULAR | Status: DC | PRN
  Administered 2017-04-14: 10 mL

## 2017-04-14 MED ORDER — MIDAZOLAM HCL 2 MG/2ML IJ SOLN
INTRAMUSCULAR | Status: AC | PRN
  Administered 2017-04-14 (×2): 0.5 mg via INTRAVENOUS
  Administered 2017-04-14: 1 mg via INTRAVENOUS

## 2017-04-14 MED ORDER — FENTANYL CITRATE (PF) 100 MCG/2ML IJ SOLN
INTRAMUSCULAR | Status: AC
  Filled 2017-04-14: qty 2

## 2017-04-14 MED ORDER — MIDAZOLAM HCL 2 MG/2ML IJ SOLN
INTRAMUSCULAR | Status: AC
  Filled 2017-04-14: qty 2

## 2017-04-14 MED ORDER — SODIUM CHLORIDE 0.9 % IV SOLN
250.0000 ug | Freq: Once | INTRAVENOUS | Status: AC
  Administered 2017-04-14: 0.25 mg via INTRAVENOUS
  Filled 2017-04-14: qty 0.25

## 2017-04-14 MED ORDER — FENTANYL CITRATE (PF) 100 MCG/2ML IJ SOLN
INTRAMUSCULAR | Status: AC | PRN
  Administered 2017-04-14: 50 ug via INTRAVENOUS

## 2017-04-14 NOTE — Procedures (Signed)
Adrenal vein sampling with stimulation via R CFV  1. R adrenal v  2. L adrenal v  3. L renal v  4. R renal v  5. Suprarenal IVC  6. Infrarenal IVC Each with concurrent companion peripheral venous samples   No complication No blood loss. See complete dictation in [facility].

## 2017-04-14 NOTE — Sedation Documentation (Signed)
Sheath to right groin removed.

## 2017-04-14 NOTE — Discharge Instructions (Addendum)
Femoral Site Care °Refer to this sheet in the next few weeks. These instructions provide you with information about caring for yourself after your procedure. Your health care provider may also give you more specific instructions. Your treatment has been planned according to current medical practices, but problems sometimes occur. Call your health care provider if you have any problems or questions after your procedure. °What can I expect after the procedure? °After your procedure, it is typical to have the following: °· Bruising at the site that usually fades within 1-2 weeks. °· Blood collecting in the tissue (hematoma) that may be painful to the touch. It should usually decrease in size and tenderness within 1-2 weeks. °Follow these instructions at home: °· Take medicines only as directed by your health care provider. °· You may shower 24-48 hours after the procedure or as directed by your health care provider. Remove the bandage (dressing) and gently wash the site with plain soap and water. Pat the area dry with a clean towel. Do not rub the site, because this may cause bleeding. °· Do not take baths, swim, or use a hot tub until your health care provider approves. °· Check your insertion site every day for redness, swelling, or drainage. °· Do not apply powder or lotion to the site. °· Limit use of stairs to twice a day for the first 2-3 days or as directed by your health care provider. °· Do not squat for the first 2-3 days or as directed by your health care provider. °· Do not lift over 10 lb (4.5 kg) for 5 days after your procedure or as directed by your health care provider. °· Ask your health care provider when it is okay to: °¨ Return to work or school. °¨ Resume usual physical activities or sports. °¨ Resume sexual activity. °· Do not drive home if you are discharged the same day as the procedure. Have someone else drive you. °· You may drive 24 hours after the procedure unless otherwise instructed by  your health care provider. °· Do not operate machinery or power tools for 24 hours after the procedure or as directed by your health care provider. °· If your procedure was done as an outpatient procedure, which means that you went home the same day as your procedure, a responsible adult should be with you for the first 24 hours after you arrive home. °· Keep all follow-up visits as directed by your health care provider. This is important. °Contact a health care provider if: °· You have a fever. °· You have chills. °· You have increased bleeding from the site. Hold pressure on the site. °Get help right away if: °· You have unusual pain at the site. °· You have redness, warmth, or swelling at the site. °· You have drainage (other than a small amount of blood on the dressing) from the site. °· The site is bleeding, and the bleeding does not stop after 30 minutes of holding steady pressure on the site. °· Your leg or foot becomes pale, cool, tingly, or numb. °This information is not intended to replace advice given to you by your health care provider. Make sure you discuss any questions you have with your health care provider. °Document Released: 07/12/2014 Document Revised: 04/15/2016 Document Reviewed: 05/28/2014 °Elsevier Interactive Patient Education © 2017 [employer] °Moderate Conscious Sedation, Adult, Care After °These instructions provide you with information about caring for yourself after your procedure. Your health care provider may also give you more specific   instructions. Your treatment has been planned according to current medical practices, but problems sometimes occur. Call your health care provider if you have any problems or questions after your procedure. °What can I expect after the procedure? °After your procedure, it is common: °· To feel sleepy for several hours. °· To feel clumsy and have poor balance for several hours. °· To have poor judgment for several hours. °· To vomit if you eat too  soon. °Follow these instructions at home: °For at least 24 hours after the procedure:  ° °· Do not: °¨ Participate in activities where you could fall or become injured. °¨ Drive. °¨ Use heavy machinery. °¨ Drink alcohol. °¨ Take sleeping pills or medicines that cause drowsiness. °¨ Make important decisions or sign legal documents. °¨ Take care of children on your own. °· Rest. °Eating and drinking  °· Follow the diet recommended by your health care provider. °· If you vomit: °¨ Drink water, juice, or soup when you can drink without vomiting. °¨ Make sure you have little or no nausea before eating solid foods. °General instructions  °· Have a responsible adult stay with you until you are awake and alert. °· Take over-the-counter and prescription medicines only as told by your health care provider. °· If you smoke, do not smoke without supervision. °· Keep all follow-up visits as told by your health care provider. This is important. °Contact a health care provider if: °· You keep feeling nauseous or you keep vomiting. °· You feel light-headed. °· You develop a rash. °· You have a fever. °Get help right away if: °· You have trouble breathing. °This information is not intended to replace advice given to you by your health care provider. Make sure you discuss any questions you have with your health care provider. °Document Released: 08/29/2013 Document Revised: 04/12/2016 Document Reviewed: 02/28/2016 °Elsevier Interactive Patient Education © 2017 Else[employer] Inc. ° °

## 2017-04-14 NOTE — H&P (Signed)
Chief Complaint: Patient was seen in consultation today for Adrenal venous sampling at the request of Ellison,Sean  Referring Physician(s): Ellison,Sean  Supervising Physician: Oley Balm  Patient Status: [facility] - Out-pt  History of Present Illness: [name] is a 51 y.o. male   Hyperaldosteronism HTN CHF CAD CKD  Followed by Dr Everardo All for hyperaldosteronism Most recently 03/16/2017 CT: 04/01/17: IMPRESSION: Adrenal glands appear normal. Aortic atherosclerosis. Small fat containing periumbilical hernia. No other abnormality seen in the Abdomen.  Request for adrenal venous sampling  Past Medical History:  Diagnosis Date  . CAD (coronary artery disease) 02/18/2009   Small OM occlusion with collaterals, 60% RCA, 40% LAD. April 2015    . Cardiomyopathy, nonischemic (HCC) 05/03/2016  . CHF (congestive heart failure) (HCC)   . CKD (chronic kidney disease)   . History of syncope 08/2008   Secondary to hypertension  . Hypertension, essential, benign    a. renal art [place] (6/14): no RAS  . Hypokalemia 05/03/2016  . Noncompliance   . Nonischemic cardiomyopathy (HCC)    EF 45%  . Obesity   . Sleep apnea   . Tobacco abuse     Past Surgical History:  Procedure Laterality Date  . CARDIAC CATHETERIZATION N/A 12/22/2015   Procedure: Left Heart Cath and Coronary Angiography;  Surgeon: Lyn Records, MD;  Location: [facility] INVASIVE CV LAB;  Service: Cardiovascular;  Laterality: N/A;  . LEFT HEART CATHETERIZATION WITH CORONARY ANGIOGRAM N/A 02/21/2014   Procedure: LEFT HEART CATHETERIZATION WITH CORONARY ANGIOGRAM;  Surgeon: Kathleene Hazel, MD;  Location: [facility] CATH LAB;  Service: Cardiovascular;  Laterality: N/A;  . None      Allergies: Penicillins and Shrimp [shellfish allergy]  Medications: Prior to Admission medications   Medication Sig Start Date End Date Taking? Authorizing Provider  amLODipine (NORVASC) 10 MG tablet Take 1 tablet (10 mg total) by mouth daily.  08/16/16  Yes Janetta Hora, PA-C  aspirin EC 81 MG tablet Take 1 tablet (81 mg total) by mouth daily. 12/23/15  Yes Gherghe, Daylene Katayama, MD  furosemide (LASIX) 80 MG tablet Take 1 tablet (80 mg total) by mouth 2 (two) times daily. 07/19/16 04/11/17 Yes Lewayne Bunting, MD  hydrALAZINE (APRESOLINE) 100 MG tablet Take 1 tablet (100 mg total) by mouth 3 (three) times daily. 01/27/17  Yes Lewayne Bunting, MD  isosorbide mononitrate (IMDUR) 60 MG 24 hr tablet Take 1 tablet (60 mg total) by mouth daily. 01/27/17  Yes Lewayne Bunting, MD  labetalol (NORMODYNE) 200 MG tablet Take 2.5 tablets (500 mg total) by mouth 2 (two) times daily. 03/29/17  Yes Lewayne Bunting, MD  Multiple Vitamin (MULTIVITAMIN WITH MINERALS) TABS tablet Take 1 tablet by mouth daily.   Yes [provider]  nitroGLYCERIN (NITROSTAT) 0.4 MG SL tablet Place 1 tablet (0.4 mg total) under the tongue every 5 (five) minutes as needed. For chest pain 12/23/15  Yes Gherghe, Daylene Katayama, MD  Potassium Chloride ER 20 MEQ TBCR Take 60 mEq by mouth daily. 08/16/16  Yes Janetta Hora, PA-C  spironolactone (ALDACTONE) 25 MG tablet Take 1 tablet (25 mg total) by mouth daily. 08/17/16  Yes Janetta Hora, PA-C  atorvastatin (LIPITOR) 80 MG tablet Take 1 tablet (80 mg total) by mouth daily at 6 PM. Patient not taking: Reported on 04/11/2017 12/23/15   Leatha Gilding, MD  lisinopril (PRINIVIL,ZESTRIL) 40 MG tablet Take 1 tablet (40 mg total) by mouth daily. Patient not taking: Reported on 04/11/2017  12/23/15   Leatha Gilding, MD     Family History  Problem Relation Age of Onset  . Lupus Mother   . Heart disease Maternal Grandfather   . Coronary artery disease Neg Hx   . Adrenal disorder Neg Hx     Social History   Social History  . Marital status: Single    Spouse name: N/A  . Number of children: 5  . Years of education: N/A   Occupational History  . [employer]   .  [employer]   Social History Main Topics  .  Smoking status: Former Smoker    Packs/day: 0.10    Years: 32.00    Types: Cigarettes  . Smokeless tobacco: Never Used     Comment: Started smoking at age 50 (28).  Cutting back  . Alcohol use No  . Drug use: No  . Sexual activity: Not Currently   Other Topics Concern  . None   Social History Narrative   Lives with girlfriend.    Review of Systems: A 12 point ROS discussed and pertinent positives are indicated in the HPI above.  All other systems are negative.  Review of Systems  Constitutional: Negative for activity change, appetite change, fatigue and fever.  Respiratory: Negative for cough and shortness of breath.   Cardiovascular: Negative for chest pain.  Gastrointestinal: Negative for abdominal pain.  Musculoskeletal: Negative for back pain.  Neurological: Negative for weakness.  Psychiatric/Behavioral: Negative for behavioral problems, confusion and decreased concentration.    Vital Signs: There were no vitals taken for this visit.  Physical Exam  Constitutional: He is oriented to person, place, and time. He appears well-nourished.  Cardiovascular: Normal rate, regular rhythm and normal heart sounds.   No murmur heard. Pulmonary/Chest: Effort normal and breath sounds normal. He has no wheezes.  Abdominal: Soft. Bowel sounds are normal. There is no tenderness.  Musculoskeletal: Normal range of motion.  Neurological: He is alert and oriented to person, place, and time.  Skin: Skin is warm and dry.  Psychiatric: He has a normal mood and affect. His behavior is normal. Judgment and thought content normal.  Nursing note and vitals reviewed.   Mallampati Score:  MD Evaluation Airway: WNL Heart: WNL Abdomen: WNL Chest/ Lungs: WNL ASA  Classification: 2 Mallampati/Airway Score: Two  Imaging: Ct Adrenal Abd Wo  Result Date: 04/01/2017 CLINICAL DATA:  Hyperaldosteronism. EXAM: CT ABDOMEN WITHOUT CONTRAST TECHNIQUE: Multidetector CT imaging of the abdomen was  performed following the standard protocol without IV contrast. COMPARISON:  None. FINDINGS: Lower chest: No acute abnormality. Hepatobiliary: No focal liver abnormality is seen. No gallstones, gallbladder wall thickening, or biliary dilatation. Pancreas: Unremarkable. No pancreatic ductal dilatation or surrounding inflammatory changes. Spleen: Normal in size without focal abnormality. Adrenals/Urinary Tract: Adrenal glands are unremarkable. Kidneys are normal, without renal calculi, focal lesion, or hydronephrosis. Stomach/Bowel: Stomach is within normal limits. Appendix appears normal. No evidence of bowel wall thickening, distention, or inflammatory changes. Vascular/Lymphatic: Aortic atherosclerosis. No enlarged abdominal lymph nodes. Other: Small fat containing periumbilical hernia. No abnormal fluid collection is noted. Musculoskeletal: No acute or significant osseous findings. IMPRESSION: Adrenal glands appear normal. Aortic atherosclerosis. Small fat containing periumbilical hernia. No other abnormality seen in the abdomen. Electronically Signed   By: Lupita Raider, M.D.   On: 04/01/2017 12:47    Labs:  CBC:  Recent Labs  04/30/16 1015 08/15/16 0735 08/16/16 0601 04/14/17 0635  WBC 8.7 6.0 5.3 7.4  HGB 13.7 16.2 14.2 14.5  HCT 41.3 47.7 42.8 42.4  PLT 261 281 259 327    COAGS: No results for input(s): INR, APTT in the last 8760 hours.  BMP:  Recent Labs  05/03/16 0347  08/15/16 0735 08/16/16 0601 01/21/17 1059 03/16/17 1431  NA 137  < > 134* 135 138 136  K 3.7  < > 2.8* 3.0* 4.1 4.8  CL 100*  < > 97* 99* 106 102  CO2 26  < > 26 25 23 29   GLUCOSE 98  < > 126* 119* 105* 99  BUN 26*  < > 13 18 23  31*  CALCIUM 8.8*  < > 9.0 9.1 9.1 9.6  CREATININE 2.01*  < > 1.74* 2.10* 1.84* 2.41*  GFRNONAA 37*  --  44* 35* 42*  --   GFRAA 43*  --  51* 41* 48*  --   < > = values in this interval not displayed.  LIVER FUNCTION TESTS:  Recent Labs  01/21/17 1059  BILITOT 0.4  AST  19  ALT 32  ALKPHOS 68  PROT 7.3  ALBUMIN 4.2    TUMOR MARKERS: No results for input(s): AFPTM, CEA, CA199, CHROMGRNA in the last 8760 hours.  Assessment and Plan:  Hyperaldosteronism CT shows normal adrenal glands HTN; CAD Scheduled now for adrenal venous sampling Risks and Benefits discussed with the patient including, but not limited to bleeding, infection, vascular injury or contrast induced renal failure. All of the patient's questions were answered, patient is agreeable to proceed. Consent signed and in chart.  Thank you for this interesting consult.  I greatly enjoyed meeting [name] and look forward to participating in their care.  A copy of this report was sent to the requesting provider on this date.  Electronically Signed: Robet Leu, PA-C 04/14/2017, 6:56 AM   I spent a total of  30 Minutes   in face to face in clinical consultation, greater than 50% of which was counseling/coordinating care for adrenal venous sampling

## 2017-04-19 LAB — ALDOSTERONE
ALDOSTERONE: 111.5 ng/dL — AB (ref 0.0–30.0)
ALDOSTERONE: 33.7 ng/dL — AB (ref 0.0–30.0)
ALDOSTERONE: 39.9 ng/dL — AB (ref 0.0–30.0)
ALDOSTERONE: 39.9 ng/dL — AB (ref 0.0–30.0)
Aldosterone: 36.7 ng/dL — ABNORMAL HIGH (ref 0.0–30.0)
Aldosterone: 215.2 ng/dL — ABNORMAL HIGH (ref 0.0–30.0)
Aldosterone: 37.3 ng/dL — ABNORMAL HIGH (ref 0.0–30.0)
Aldosterone: 38.3 ng/dL — ABNORMAL HIGH (ref 0.0–30.0)
Aldosterone: 38.4 ng/dL — ABNORMAL HIGH (ref 0.0–30.0)
Aldosterone: 39.1 ng/dL — ABNORMAL HIGH (ref 0.0–30.0)
Aldosterone: 40.6 ng/dL — ABNORMAL HIGH (ref 0.0–30.0)

## 2017-04-20 MED FILL — ?FUROSEMIDE 80MG TABLET: 80 | 30 days supply | Qty: 60 | Fill #4

## 2017-04-20 MED FILL — AMLODIPINE BESYLATE 10 MG T: 10 | 30 days supply | Qty: 30 | Fill #6

## 2017-04-20 MED FILL — ?SPIRONOLACTONE 25 MG TABLE: 25 | 30 days supply | Qty: 30 | Fill #6

## 2017-04-20 MED FILL — ISOSORBIDE MN ER 60 MG TAB: 60 | 30 days supply | Qty: 30 | Fill #2

## 2017-04-20 MED FILL — POTASSIUM CL ER 20 MEQ TAB: 20 | 30 days supply | Qty: 90 | Fill #6

## 2017-04-26 LAB — ALDOSTERONE
ALDOSTERONE: 2661.7 ng/dL — AB (ref 0.0–30.0)
ALDOSTERONE: 5445.7 ng/dL — AB (ref 0.0–30.0)

## 2017-04-27 ENCOUNTER — Other Ambulatory Visit: Payer: Self-pay | Admitting: Endocrinology

## 2017-04-27 DIAGNOSIS — E269 Hyperaldosteronism, unspecified: Secondary | ICD-10-CM

## 2017-05-03 ENCOUNTER — Telehealth: Payer: Self-pay | Admitting: Endocrinology

## 2017-05-03 NOTE — Telephone Encounter (Signed)
Spoke with representative from [facility]. She was needing to confirm the diagnosis and if this was a urgent referral. I confirmed the diagnosis was hyperaldosteronism and Dr. Everardo All had not indicated in the referral this was to be a urgent referral. She voiced understanding and had no further questions at this time.

## 2017-05-03 NOTE — Telephone Encounter (Signed)
[facility] called to inquire about a referral for the patient. Transferred the call to Megan B.

## 2017-05-26 MED FILL — AMLODIPINE BESYLATE 10 MG T: 10 | 30 days supply | Qty: 30 | Fill #7

## 2017-05-26 MED FILL — ?SPIRONOLACTONE 25 MG TABLE: 25 | 30 days supply | Qty: 30 | Fill #7

## 2017-05-26 MED FILL — ?FUROSEMIDE 80MG TABLET: 80 | 30 days supply | Qty: 60 | Fill #5

## 2017-05-26 MED FILL — POTASSIUM CL ER 20 MEQ TAB: 20 | 30 days supply | Qty: 90 | Fill #7

## 2017-05-26 MED FILL — LABETALOL HCL 200 MG TABLET: 200 | 30 days supply | Qty: 180 | Fill #1

## 2017-05-26 MED FILL — ISOSORBIDE MN ER 60 MG TAB: 60 | 30 days supply | Qty: 30 | Fill #3

## 2017-06-24 NOTE — Progress Notes (Signed)
HPI: FU CAD and hypertension. Renal Dopplers June 2014 normal. Cardiac catheterization January 2017 showed a 90% first marginal, occluded ramus, 50% ost LAD, 70% circumflex, 80% distal LAD and 65% right coronary artery; EF 25-30; LVEDP 40. Echocardiogram repeated June 2017 showed ejection fraction 35-40% and mild left atrial enlargement. Patient with history of noncompliance. Hospitalized 9/17 with hypertensive urgency and chest pain; aldosterone level elevated at 37 and aldo/renin ratio elevated at 84.4. Pt referred to endocrine. Since last seen, patient does complain of dyspnea on exertion but denies orthopnea, PND, pedal edema, chest pain or syncope.  Current Outpatient Prescriptions  Medication Sig Dispense Refill  . amLODipine (NORVASC) 10 MG tablet Take 1 tablet (10 mg total) by mouth daily. 90 tablet 3  . aspirin EC 81 MG tablet Take 1 tablet (81 mg total) by mouth daily. 90 tablet 3  . atorvastatin (LIPITOR) 80 MG tablet Take 1 tablet (80 mg total) by mouth daily at 6 PM. 30 tablet 1  . hydrALAZINE (APRESOLINE) 100 MG tablet Take 1 tablet (100 mg total) by mouth 3 (three) times daily. 90 tablet 11  . isosorbide mononitrate (IMDUR) 60 MG 24 hr tablet Take 1 tablet (60 mg total) by mouth daily. 30 tablet 11  . labetalol (NORMODYNE) 200 MG tablet Take 2.5 tablets (500 mg total) by mouth 2 (two) times daily. 450 tablet 3  . lisinopril (PRINIVIL,ZESTRIL) 40 MG tablet Take 1 tablet (40 mg total) by mouth daily. 30 tablet 1  . Multiple Vitamin (MULTIVITAMIN WITH MINERALS) TABS tablet Take 1 tablet by mouth daily.    . nitroGLYCERIN (NITROSTAT) 0.4 MG SL tablet Place 1 tablet (0.4 mg total) under the tongue every 5 (five) minutes as needed. For chest pain 30 tablet 1  . Potassium Chloride ER 20 MEQ TBCR Take 60 mEq by mouth daily. 270 tablet 3  . spironolactone (ALDACTONE) 25 MG tablet Take 1 tablet (25 mg total) by mouth daily. 90 tablet 6  . furosemide (LASIX) 80 MG tablet Take 1 tablet  (80 mg total) by mouth 2 (two) times daily. 180 tablet 3   No current facility-administered medications for this visit.      Past Medical History:  Diagnosis Date  . CAD (coronary artery disease) 02/18/2009   Small OM occlusion with collaterals, 60% RCA, 40% LAD. April 2015    . Cardiomyopathy, nonischemic (HCC) 05/03/2016  . CHF (congestive heart failure) (HCC)   . CKD (chronic kidney disease)   . History of syncope 08/2008   Secondary to hypertension  . Hypertension, essential, benign    a. renal art [place] (6/14): no RAS  . Hypokalemia 05/03/2016  . Noncompliance   . Nonischemic cardiomyopathy (HCC)    EF 45%  . Obesity   . Sleep apnea   . Tobacco abuse     Past Surgical History:  Procedure Laterality Date  . CARDIAC CATHETERIZATION N/A 12/22/2015   Procedure: Left Heart Cath and Coronary Angiography;  Surgeon: Lyn Records, MD;  Location: [facility] INVASIVE CV LAB;  Service: Cardiovascular;  Laterality: N/A;  . IR US GUIDE VASC ACCESS RIGHT  04/14/2017  . IR VENOGRAM ADRENAL BI  04/14/2017  . IR VENOGRAM RENAL BI  04/14/2017  . IR VENOUS SAMPLING  04/14/2017  . IR VENOUS SAMPLING  04/14/2017  . LEFT HEART CATHETERIZATION WITH CORONARY ANGIOGRAM N/A 02/21/2014   Procedure: LEFT HEART CATHETERIZATION WITH CORONARY ANGIOGRAM;  Surgeon: Kathleene Hazel, MD;  Location: [facility] CATH LAB;  Service: Cardiovascular;  Laterality: N/A;  . None      Social History   Social History  . Marital status: Single    Spouse name: N/A  . Number of children: 5  . Years of education: N/A   Occupational History  . [employer]   .  [employer]   Social History Main Topics  . Smoking status: Former Smoker    Packs/day: 0.10    Years: 32.00    Types: Cigarettes  . Smokeless tobacco: Never Used     Comment: Started smoking at age 22 (15).  Cutting back  . Alcohol use No  . Drug use: No  . Sexual activity: Not Currently   Other Topics Concern  . Not on file   Social History Narrative    Lives with girlfriend.    Family History  Problem Relation Age of Onset  . Lupus Mother   . Heart disease Maternal Grandfather   . Coronary artery disease Neg Hx   . Adrenal disorder Neg Hx     ROS: no fevers or chills, productive cough, hemoptysis, dysphasia, odynophagia, melena, hematochezia, dysuria, hematuria, rash, seizure activity, orthopnea, PND, pedal edema, claudication. Remaining systems are negative.  Physical Exam: Well-developed well-nourished in no acute distress.  Skin is warm and dry.  HEENT is normal.  Neck is supple.  Chest is clear to auscultation with normal expansion.  Cardiovascular exam is regular rate and rhythm.  Abdominal exam nontender or distended. No masses palpated. Extremities show no edema. neuro grossly intact   A/P  1 coronary artery disease-continue aspirin and statin. Patient denies chest pain.  2 hypertension-blood pressure controlled. Continue present medications and follow.   3 cardiomyopathy-Likely hypertensive mediated. Blood pressure is now controlled. Repeat echocardiogram. Continue ACE inhibitor and beta blocker.  4 chronic stage III kidney disease-Followed by nephrology.  5-hyperaldosteronism-Now followed by Dr. Everardo All.  6 hyperlipidemia-continue statin. Check lipids and liver.  Olga Millers, MD

## 2017-06-27 MED FILL — AMLODIPINE BESYLATE 10 MG T: 10 | 30 days supply | Qty: 30 | Fill #8

## 2017-06-27 MED FILL — ?SPIRONOLACTONE 25 MG TABLE: 25 | 30 days supply | Qty: 30 | Fill #8

## 2017-06-27 MED FILL — POTASSIUM CL ER 20 MEQ TAB: 20 | 30 days supply | Qty: 90 | Fill #8

## 2017-07-04 ENCOUNTER — Encounter: Payer: Self-pay | Admitting: Cardiology

## 2017-07-04 ENCOUNTER — Ambulatory Visit (INDEPENDENT_AMBULATORY_CARE_PROVIDER_SITE_OTHER): Payer: Self-pay | Admitting: Cardiology

## 2017-07-04 VITALS — BP 130/82 | HR 80 | Ht 67.0 in | Wt 216.0 lb

## 2017-07-04 DIAGNOSIS — I10 Essential (primary) hypertension: Secondary | ICD-10-CM

## 2017-07-04 DIAGNOSIS — I428 Other cardiomyopathies: Secondary | ICD-10-CM

## 2017-07-04 DIAGNOSIS — I2510 Atherosclerotic heart disease of native coronary artery without angina pectoris: Secondary | ICD-10-CM

## 2017-07-04 DIAGNOSIS — E7800 Pure hypercholesterolemia, unspecified: Secondary | ICD-10-CM

## 2017-07-04 NOTE — Patient Instructions (Signed)
Medication Instructions:   NO CHANGE  Labwork:  Your physician recommends that you return for lab work WHEN FASTING  Testing/Procedures:  Your physician has requested that you have an echocardiogram. Echocardiography is a painless test that uses sound waves to create images of your heart. It provides your doctor with information about the size and shape of your heart and how well your heart's chambers and valves are working. This procedure takes approximately one hour. There are no restrictions for this procedure.    Follow-Up:  Your physician wants you to follow-up in: 6 MONTHS WITH DR CRENSHAW You will receive a reminder letter in the mail two months in advance. If you don't receive a letter, please call our office to schedule the follow-up appointment.   If you need a refill on your cardiac medications before your next appointment, please call your pharmacy.    

## 2017-07-18 ENCOUNTER — Other Ambulatory Visit: Payer: Self-pay

## 2017-07-18 ENCOUNTER — Ambulatory Visit (HOSPITAL_COMMUNITY): Payer: Medicaid Other | Attending: Cardiology

## 2017-07-18 VITALS — BP 189/113

## 2017-07-18 DIAGNOSIS — I428 Other cardiomyopathies: Secondary | ICD-10-CM | POA: Insufficient documentation

## 2017-07-18 DIAGNOSIS — Z87891 Personal history of nicotine dependence: Secondary | ICD-10-CM | POA: Insufficient documentation

## 2017-07-18 DIAGNOSIS — I2510 Atherosclerotic heart disease of native coronary artery without angina pectoris: Secondary | ICD-10-CM | POA: Insufficient documentation

## 2017-07-18 DIAGNOSIS — G4730 Sleep apnea, unspecified: Secondary | ICD-10-CM | POA: Insufficient documentation

## 2017-07-18 DIAGNOSIS — I129 Hypertensive chronic kidney disease with stage 1 through stage 4 chronic kidney disease, or unspecified chronic kidney disease: Secondary | ICD-10-CM | POA: Insufficient documentation

## 2017-07-18 DIAGNOSIS — N189 Chronic kidney disease, unspecified: Secondary | ICD-10-CM | POA: Diagnosis not present

## 2017-07-18 LAB — ECHOCARDIOGRAM COMPLETE

## 2017-07-18 MED ORDER — PERFLUTREN LIPID MICROSPHERE
1.0000 mL | INTRAVENOUS | Status: AC | PRN
  Administered 2017-07-18: 1 mL via INTRAVENOUS

## 2017-07-23 ENCOUNTER — Other Ambulatory Visit: Payer: Self-pay | Admitting: Cardiology

## 2017-07-23 DIAGNOSIS — I10 Essential (primary) hypertension: Secondary | ICD-10-CM

## 2017-07-23 MED FILL — POTASSIUM CL ER 20 MEQ TAB: 20 | 30 days supply | Qty: 90 | Fill #9

## 2017-07-23 MED FILL — ?SPIRONOLACTONE 25 MG TABLE: 25 | 30 days supply | Qty: 30 | Fill #9

## 2017-07-23 MED FILL — AMLODIPINE BESYLATE 10 MG T: 10 | 30 days supply | Qty: 30 | Fill #9

## 2017-07-26 ENCOUNTER — Ambulatory Visit: Payer: Self-pay | Admitting: Family Medicine

## 2017-07-26 MED FILL — FUROSEMIDE 80 MG TABLET: 80 | 30 days supply | Qty: 60 | Fill #0

## 2017-08-31 ENCOUNTER — Other Ambulatory Visit: Payer: Self-pay | Admitting: Physician Assistant

## 2017-08-31 MED FILL — ?SPIRONOLACTONE 25 MG TABLE: 25 | 30 days supply | Qty: 30 | Fill #0

## 2017-08-31 MED FILL — POTASSIUM CL ER 20 MEQ TAB: 20 | 30 days supply | Qty: 90 | Fill #0

## 2017-09-12 ENCOUNTER — Encounter: Payer: Self-pay | Admitting: *Deleted

## 2017-09-21 ENCOUNTER — Encounter: Payer: Self-pay | Admitting: Family Medicine

## 2017-09-21 ENCOUNTER — Ambulatory Visit (INDEPENDENT_AMBULATORY_CARE_PROVIDER_SITE_OTHER): Payer: Medicaid Other | Admitting: Family Medicine

## 2017-09-21 VITALS — BP 182/98 | HR 79 | Temp 98.0°F | Resp 14 | Ht 67.0 in | Wt 220.0 lb

## 2017-09-21 DIAGNOSIS — K0889 Other specified disorders of teeth and supporting structures: Secondary | ICD-10-CM

## 2017-09-21 DIAGNOSIS — E269 Hyperaldosteronism, unspecified: Secondary | ICD-10-CM | POA: Diagnosis not present

## 2017-09-21 DIAGNOSIS — N183 Chronic kidney disease, stage 3 unspecified: Secondary | ICD-10-CM

## 2017-09-21 DIAGNOSIS — I160 Hypertensive urgency: Secondary | ICD-10-CM

## 2017-09-21 LAB — POCT URINALYSIS DIP (DEVICE)
Bilirubin Urine: NEGATIVE
GLUCOSE, UA: NEGATIVE mg/dL
Ketones, ur: NEGATIVE mg/dL
Leukocytes, UA: NEGATIVE
Nitrite: NEGATIVE
Specific Gravity, Urine: 1.025 (ref 1.005–1.030)
UROBILINOGEN UA: 0.2 mg/dL (ref 0.0–1.0)
pH: 6.5 (ref 5.0–8.0)

## 2017-09-21 LAB — CBC WITH DIFFERENTIAL/PLATELET
BASOS PCT: 0.6 %
Basophils Absolute: 43 cells/uL (ref 0–200)
EOS PCT: 2.1 %
Eosinophils Absolute: 151 cells/uL (ref 15–500)
HCT: 41.6 % (ref 38.5–50.0)
Hemoglobin: 14.2 g/dL (ref 13.2–17.1)
LYMPHS ABS: 1663 {cells}/uL (ref 850–3900)
MCH: 30.4 pg (ref 27.0–33.0)
MCHC: 34.1 g/dL (ref 32.0–36.0)
MCV: 89.1 fL (ref 80.0–100.0)
MPV: 9.7 fL (ref 7.5–12.5)
Monocytes Relative: 11 %
Neutro Abs: 4550 cells/uL (ref 1500–7800)
Neutrophils Relative %: 63.2 %
Platelets: 333 10*3/uL (ref 140–400)
RBC: 4.67 10*6/uL (ref 4.20–5.80)
RDW: 13.8 % (ref 11.0–15.0)
TOTAL LYMPHOCYTE: 23.1 %
WBC: 7.2 10*3/uL (ref 3.8–10.8)
WBCMIX: 792 {cells}/uL (ref 200–950)

## 2017-09-21 LAB — COMPLETE METABOLIC PANEL WITH GFR
AG Ratio: 1.2 (calc) (ref 1.0–2.5)
ALKALINE PHOSPHATASE (APISO): 68 U/L (ref 40–115)
ALT: 13 U/L (ref 9–46)
AST: 16 U/L (ref 10–35)
Albumin: 4.2 g/dL (ref 3.6–5.1)
BILIRUBIN TOTAL: 0.5 mg/dL (ref 0.2–1.2)
BUN/Creatinine Ratio: 13 (calc) (ref 6–22)
BUN: 23 mg/dL (ref 7–25)
CHLORIDE: 102 mmol/L (ref 98–110)
CO2: 24 mmol/L (ref 20–32)
CREATININE: 1.76 mg/dL — AB (ref 0.70–1.33)
Calcium: 9.3 mg/dL (ref 8.6–10.3)
GFR, Est African American: 51 mL/min/{1.73_m2} — ABNORMAL LOW (ref >=60)
GFR, Est Non African American: 44 mL/min/{1.73_m2} — ABNORMAL LOW (ref >=60)
GLUCOSE: 106 mg/dL — AB (ref 65–99)
Globulin: 3.5 g/dL (calc) (ref 1.9–3.7)
Potassium: 4.3 mmol/L (ref 3.5–5.3)
SODIUM: 136 mmol/L (ref 135–146)
Total Protein: 7.7 g/dL (ref 6.1–8.1)

## 2017-09-21 MED ORDER — AMLODIPINE BESYLATE 10 MG PO TABS: 10.0000 mg | ORAL_TABLET | Freq: Every day | ORAL | 3 refills | Status: DC

## 2017-09-21 MED ORDER — CLONIDINE HCL 0.1 MG PO TABS
0.2000 mg | ORAL_TABLET | Freq: Once | ORAL | Status: AC
  Administered 2017-09-21: 0.2 mg via ORAL

## 2017-09-21 MED ORDER — CLINDAMYCIN HCL 300 MG PO CAPS: 300.0000 mg | ORAL_CAPSULE | Freq: Three times a day (TID) | ORAL | 0 refills | Status: AC

## 2017-09-21 MED ORDER — ACETAMINOPHEN-CODEINE #3 300-30 MG PO TABS: 1.0000 | ORAL_TABLET | Freq: Four times a day (QID) | ORAL | 0 refills | Status: AC | PRN

## 2017-09-21 MED FILL — AMLODIPINE BESYLATE 10 MG T: 10 | 30 days supply | Qty: 30 | Fill #0

## 2017-09-21 MED FILL — CLINDAMYCIN HCL 300 MG CAP: 300 | 7 days supply | Qty: 21 | Fill #0

## 2017-09-21 NOTE — Patient Instructions (Addendum)
Will start clindamycin 300 mg 3 times a day over the next 7 days for right facial swelling and dental pain.  Dr. Wayland Denis has agreed to see you for the dental problem on tomorrow.  The phone number is [phone] and the address is [address] [place].  you have a possible abscess.  Apply warm moist compresses to right face 20 minutes 4 times a day as needed.  Will also start Tylenol 3 for moderate to severe dental pain.  Refrain from drinking driving or operating machinery while taking this medication.  He was given clonidine 0.2 mg x1 for hypertensive urgency without complication. Continue all antihypertensive medications as previously prescribed. You have hyperaldosteronism, I have sent a referral to endocrinology for further workup and evaluation.      Follow-up in 3 months for chronic conditions

## 2017-09-21 NOTE — Progress Notes (Signed)
Subjective:    Patient ID: [name], male    DOB: 07/24/1966, 51 y.o.   MRN: [id number]  HPI [name], a 51 year old patient with a history of hypertensive urgency and dental pain presents for a follow up.  Blood pressure was markedly elevated on arrival.  Patient states that he is not taking blood pressure medication.  He did not take medication because he had not eaten breakfast.  Also, he has been out of amlodipine over the past 2 weeks.  He is asymptomatic at present.  He denies dizziness, fatigue, chest pain, syncope, nausea, vomiting, or diarrhea.   Patient is complaining of right dental pain for the past week.  He says that dental pain occurred suddenly upon awakening and is worsened by eating hot or cold items.  Current pain intensity is 8 out of 10 to right face.  He has not attempted any over-the-counter interventions to alleviate pain  Past Medical History:  Diagnosis Date  . CAD (coronary artery disease) 02/18/2009   Small OM occlusion with collaterals, 60% RCA, 40% LAD. April 2015    . Cardiomyopathy, nonischemic (HCC) 05/03/2016  . CHF (congestive heart failure) (HCC)   . CKD (chronic kidney disease)   . History of syncope 08/2008   Secondary to hypertension  . Hypertension, essential, benign    a. renal art [place] (6/14): no RAS  . Hypokalemia 05/03/2016  . Noncompliance   . Nonischemic cardiomyopathy (HCC)    EF 45%  . Obesity   . Sleep apnea   . Tobacco abuse    Social History   Social History  . Marital status: Single    Spouse name: N/A  . Number of children: 5  . Years of education: N/A   Occupational History  . [employer]   .  [employer]   Social History Main Topics  . Smoking status: Former Smoker    Packs/day: 0.10    Years: 32.00    Types: Cigarettes  . Smokeless tobacco: Never Used     Comment: Started smoking at age 51 (56).  Cutting back  . Alcohol use No  . Drug use: No  . Sexual activity: Not Currently   Other Topics Concern   . Not on file   Social History Narrative   Lives with girlfriend.   Immunization History  Administered Date(s) Administered  . Pneumococcal Polysaccharide-23 01/21/2017  . Tdap 11/23/2011   Review of Systems  Constitutional: Negative for fatigue, fever and unexpected weight change.  HENT: Positive for dental problem.   Eyes: Negative for photophobia and visual disturbance.  Respiratory: Negative.   Cardiovascular: Negative.   Gastrointestinal: Negative.   Endocrine: Negative for polydipsia, polyphagia and polyuria.  Genitourinary: Negative.   Musculoskeletal: Negative.  Negative for arthralgias and back pain.  Neurological: Negative.   Hematological: Negative.   Psychiatric/Behavioral: Negative.        Objective:   Physical Exam  Constitutional: He appears well-developed and well-nourished.  HENT:  Head: Normocephalic and atraumatic.  Right Ear: External ear normal.  Left Ear: External ear normal.  Nose: Nose normal.  Mouth/Throat: Oropharynx is clear and moist.  Eyes: Pupils are equal, round, and reactive to light. Conjunctivae and EOM are normal.  Neck: Normal range of motion. Neck supple.  Pulmonary/Chest: Effort normal and breath sounds normal.  Abdominal: Soft. Bowel sounds are normal.  Skin: Skin is warm and dry.  Psychiatric: He has a normal mood and affect. His behavior is normal. Judgment and  thought content normal.      BP (!) 198/112 (BP Location: Right Arm, Patient Position: Sitting, Cuff Size: Large) Comment: manually  Pulse 79   Temp 98 F (36.7 C) (Oral)   Resp 14   Ht 5\' 7"  (1.702 m)   Wt 220 lb (99.8 kg)   SpO2 99%   BMI 34.46 kg/m  Assessment & Plan:  1. Hypertensive urgency, malignant - cloNIDine (CATAPRES) tablet 0.2 mg; Take 2 tablets (0.2 mg total) by mouth once. - POCT urinalysis dip (device) - amLODipine (NORVASC) 10 MG tablet; Take 1 tablet (10 mg total) by mouth daily.  Dispense: 90 tablet; Refill: 3 - COMPLETE METABOLIC PANEL WITH  GFR - CBC with Differential  2. CKD (chronic kidney disease), stage III (HCC) - POCT urinalysis dip (device)  3. Pain, dental - clindamycin (CLEOCIN) 300 MG capsule; Take 1 capsule (300 mg total) by mouth 3 (three) times daily.  Dispense: 21 capsule; Refill: 0 - acetaminophen-codeine (TYLENOL #3) 300-30 MG tablet; Take 1 tablet by mouth every 6 (six) hours as needed for moderate pain.  Dispense: 15 tablet; Refill: 0  4. Hyperaldosteronism [facility]) [name] has a history of hyperaldosteronism he was last evaluated by Ortiz on March 16, 2017.  He continues to have renal insufficiency and is due for recheck of aldosterone renin levels.  Recommend that patient follow up with Dr. Blanch MediaSean Ortiz [place] Ortiz.  - Aldosterone + renin activity w/ ratio   RTC: 3 months for chronic condition.   Eddie Nations[place] Ortiz Eddie Atha  MSN, [name] Eddie BurlyChandra change him over to me he has never seen him before is not

## 2017-09-22 ENCOUNTER — Telehealth: Payer: Self-pay

## 2017-09-22 NOTE — Telephone Encounter (Signed)
-----   Message from Massie Maroon, [place] sent at 09/22/2017 12:45 PM EDT ----- Regarding: Lab results Please inform patient that creatinine level remains elevated which is consistent with stage III chronic kidney disease.  It is important that we maintain tight control of blood pressure.  Patient is to return in 1 week for blood pressure check.  Please make sure that patient gets added to schedule for blood pressure check also please call the office of Dr.Sean Everardo All to schedule a follow-up appointment for hyperaldosteronism.  Thanks ----- Message ----- From: Janace Hoard Lab Results In Sent: 09/21/2017   8:40 PM To: Massie Maroon, FNP

## 2017-09-22 NOTE — Telephone Encounter (Signed)
Called no answer. Left a message for patient to call back asap. Need to advised of results and of appointment with endocrinology tomorrow. Thanks!

## 2017-09-22 NOTE — Telephone Encounter (Signed)
Called and spoke with patient. Advised of creatine still elevated and that it is very important to maintain tight control of blood pressure and to come back in 1 week for a bp recheck. Patient verbalized understanding. Advised that he has an appointment tomorrow with endo at 3:15pm and he said he will be able to keep this appointment. Thanks!

## 2017-09-23 ENCOUNTER — Encounter: Payer: Self-pay | Admitting: Endocrinology

## 2017-09-23 ENCOUNTER — Ambulatory Visit (INDEPENDENT_AMBULATORY_CARE_PROVIDER_SITE_OTHER): Payer: Medicaid Other | Admitting: Endocrinology

## 2017-09-23 VITALS — BP 162/88 | HR 90 | Wt 224.0 lb

## 2017-09-23 DIAGNOSIS — E269 Hyperaldosteronism, unspecified: Secondary | ICD-10-CM

## 2017-09-23 MED ORDER — SPIRONOLACTONE 50 MG PO TABS: 50.0000 mg | ORAL_TABLET | Freq: Every day | ORAL | 11 refills | Status: DC

## 2017-09-23 MED ORDER — POTASSIUM CHLORIDE CRYS ER 20 MEQ PO TBCR: 40.0000 meq | EXTENDED_RELEASE_TABLET | Freq: Every day | ORAL | 11 refills | Status: DC

## 2017-09-23 NOTE — Progress Notes (Signed)
Subjective:    Patient ID: [name], male    DOB: [birth year], 51 y.o.   MRN: [id number]  HPI  The state of at least three ongoing medical problems is addressed today, with interval history of each noted here: Pt returns for f/u of hyperaldosteronemia (dx'ed 2010; he has assoc ischemic cardiomyopathy and renal insuff; he has nonhe takes ACEI, aldactone, lasix, and 3 more BP meds; adrenal CT was normal; venous sampling suggested relative hypersecretion from the right adrenal). He did not see surg, due to no ins.  He now has medicaid.  pt states he feels well in general.  He takes aldactone, rx'ed by cardiol.  he denies sob.  This is a stable problem. Hypokalemia: he denies muscle cramps.  This is a stable problem. HTN:  He denies leg edema.  This is a stable problem. Past Medical History:  Diagnosis Date  . CAD (coronary artery disease) 02/18/2009   Small OM occlusion with collaterals, 60% RCA, 40% LAD. April 2015    . Cardiomyopathy, nonischemic (HCC) 05/03/2016  . CHF (congestive heart failure) (HCC)   . CKD (chronic kidney disease)   . History of syncope 08/2008   Secondary to hypertension  . Hypertension, essential, benign    a. renal art [place] (6/14): no RAS  . Hypokalemia 05/03/2016  . Noncompliance   . Nonischemic cardiomyopathy (HCC)    EF 45%  . Obesity   . Sleep apnea   . Tobacco abuse     Past Surgical History:  Procedure Laterality Date  . CARDIAC CATHETERIZATION N/A 12/22/2015   Procedure: Left Heart Cath and Coronary Angiography;  Surgeon: Lyn Records, MD;  Location: [facility] INVASIVE CV LAB;  Service: Cardiovascular;  Laterality: N/A;  . IR US GUIDE VASC ACCESS RIGHT  04/14/2017  . IR VENOGRAM ADRENAL BI  04/14/2017  . IR VENOGRAM RENAL BI  04/14/2017  . IR VENOUS SAMPLING  04/14/2017  . IR VENOUS SAMPLING  04/14/2017  . LEFT HEART CATHETERIZATION WITH CORONARY ANGIOGRAM N/A 02/21/2014   Procedure: LEFT HEART CATHETERIZATION WITH CORONARY ANGIOGRAM;  Surgeon: Kathleene Hazel, MD;  Location: [facility] CATH LAB;  Service: Cardiovascular;  Laterality: N/A;  . None      Social History   Social History  . Marital status: Single    Spouse name: N/A  . Number of children: 5  . Years of education: N/A   Occupational History  . [employer]   .  [employer]   Social History Main Topics  . Smoking status: Former Smoker    Packs/day: 0.10    Years: 32.00    Types: Cigarettes  . Smokeless tobacco: Never Used     Comment: Started smoking at age 62 (51).  Cutting back  . Alcohol use No  . Drug use: No  . Sexual activity: Not Currently   Other Topics Concern  . Not on file   Social History Narrative   Lives with girlfriend.    Current Outpatient Prescriptions on File Prior to Visit  Medication Sig Dispense Refill  . acetaminophen-codeine (TYLENOL #3) 300-30 MG tablet Take 1 tablet by mouth every 6 (six) hours as needed for moderate pain. 15 tablet 0  . amLODipine (NORVASC) 10 MG tablet Take 1 tablet (10 mg total) by mouth daily. 90 tablet 3  . aspirin EC 81 MG tablet Take 1 tablet (81 mg total) by mouth daily. 90 tablet 3  . atorvastatin (LIPITOR) 80 MG tablet Take 1 tablet (80 mg total)  by mouth daily at 6 PM. 30 tablet 1  . clindamycin (CLEOCIN) 300 MG capsule Take 1 capsule (300 mg total) by mouth 3 (three) times daily. 21 capsule 0  . furosemide (LASIX) 80 MG tablet TAKE ONE TABLET BY MOUTH TWICE DAILY 180 tablet 1  . hydrALAZINE (APRESOLINE) 100 MG tablet Take 1 tablet (100 mg total) by mouth 3 (three) times daily. 90 tablet 11  . isosorbide mononitrate (IMDUR) 60 MG 24 hr tablet Take 1 tablet (60 mg total) by mouth daily. 30 tablet 11  . labetalol (NORMODYNE) 200 MG tablet Take 2.5 tablets (500 mg total) by mouth 2 (two) times daily. 450 tablet 3  . lisinopril (PRINIVIL,ZESTRIL) 40 MG tablet Take 1 tablet (40 mg total) by mouth daily. 30 tablet 1  . Multiple Vitamin (MULTIVITAMIN WITH MINERALS) TABS tablet Take 1 tablet by mouth daily.    .  nitroGLYCERIN (NITROSTAT) 0.4 MG SL tablet Place 1 tablet (0.4 mg total) under the tongue every 5 (five) minutes as needed. For chest pain 30 tablet 1   No current facility-administered medications on file prior to visit.     Allergies  Allergen Reactions  . Penicillins Other (See Comments)    Makes his throat itch really bad, and feels like it is closing up.  Has patient had a PCN reaction causing immediate rash, facial/tongue/throat swelling, SOB or lightheadedness with hypotension: Yes Has patient had a PCN reaction causing severe rash involving mucus membranes or skin necrosis: No Has patient had a PCN reaction that required hospitalization: Yes Has patient had a PCN reaction occurring within the last 10 years: No If all of the above answers are "NO", then may proceed with Cephalospo  . Shrimp [Shellfish Allergy]     Throat swells    Family History  Problem Relation Age of Onset  . Lupus Mother   . Heart disease Maternal Grandfather   . Coronary artery disease Neg Hx   . Adrenal disorder Neg Hx     BP (!) 162/88   Pulse 90   Wt 224 lb (101.6 kg)   SpO2 96%   BMI 35.08 kg/m    Review of Systems Denies gynecomastia and chest pain    Objective:   Physical Exam VITAL SIGNS:  See vs page GENERAL: no distress Ext: no leg edema.   Lab Results  Component Value Date   CREATININE 1.76 (H) 09/21/2017   BUN 23 09/21/2017   NA 136 09/21/2017   K 4.3 09/21/2017   CL 102 09/21/2017   CO2 24 09/21/2017       Assessment & Plan:  HTN: he needs increased rx Hyperaldosteronism: he needs to decrease KLOR along with increased aldactone. Renal insuff: we'll need to follow this, along with the above probs  Patient Instructions  I have sent 2 prescriptions to your pharmacy: to increase the spironolactone, and to decrease the potassium. Please see a surgery specialist.  you will receive a phone call, about a day and time for an appointment.  Please come back for a follow-up  appointment in 2 weeks.

## 2017-09-23 NOTE — Patient Instructions (Addendum)
I have sent 2 prescriptions to your pharmacy: to increase the spironolactone, and to decrease the potassium. Please see a surgery specialist.  you will receive a phone call, about a day and time for an appointment.  Please come back for a follow-up appointment in 2 weeks.

## 2017-09-27 LAB — ALDOSTERONE + RENIN ACTIVITY W/ RATIO
ALDO / PRA RATIO: 25.3 ratio (ref 0.9–28.9)
Aldosterone, Serum: 22 ng/dL
Renin Activity: 0.87 ng/mL/h (ref 0.25–5.82)

## 2017-09-27 LAB — EXTRA LAV TOP TUBE

## 2017-09-30 ENCOUNTER — Other Ambulatory Visit: Payer: Self-pay | Admitting: Family Medicine

## 2017-10-10 ENCOUNTER — Ambulatory Visit: Payer: Medicaid Other | Admitting: Endocrinology

## 2017-10-18 MED FILL — hydrALAZINE HCL 100 MG TABS: 100 | 30 days supply | Qty: 90 | Fill #1

## 2017-10-18 MED FILL — POTASSIUM CL ER 20 MEQ TAB: 20 | 30 days supply | Qty: 90 | Fill #1

## 2017-10-18 MED FILL — ?SPIRONOLACTONE 25 MG TABLE: 25 | 30 days supply | Qty: 30 | Fill #1

## 2017-10-18 MED FILL — AMLODIPINE BESYLATE 10 MG T: 10 | 30 days supply | Qty: 30 | Fill #1

## 2017-10-24 ENCOUNTER — Telehealth: Payer: Self-pay | Admitting: Cardiology

## 2017-10-24 NOTE — Telephone Encounter (Signed)
   [facility] Pre-operative Risk Assessment    Request for surgical clearance:  1. What type of surgery is being performed? Adrenalectomy    2. When is this surgery scheduled? In the near future   3. Are there any medications that need to be held prior to surgery and how long? None specified   4. Practice name and name of physician performing surgery? Dr. Armandina Gemma @ [facility]   5. What is your office phone and fax number? Phone: [phone]  Fax: [phone] Attn: Mammie Lorenzo, LPN   6. Anesthesia type (None, local, MAC, general) ? General anesthesia  ** note states patient also needs to be evaluated for uncontrolled hypertension   [name] M 10/24/2017, 4:42 PM  _________________________________________________________________   (provider comments below)

## 2017-10-25 NOTE — Telephone Encounter (Signed)
Scheduled patient for pre-op evaluation 12/20. Patient agrees with treatment plan.   Forwarded to Dr. Gerrit Friends as [facility] Childes.  Fax: [phone]

## 2017-10-25 NOTE — Telephone Encounter (Signed)
   Primary Cardiologist:Brian [name], MD  Chart reviewed as part of pre-operative protocol coverage. Revised cardiac risk index is calculated at 11% indicating moderate CV risk with surgery. Pre-op intake includes request for patient to be evaluated for uncontrolled HTN. I do not have access to paper form. Patient also following with endocrinology for this. Given request for evaluation included in surgical request, recommend scheduling appt to fulfill request.  Pre-op covering staff: - Please schedule appointment and call patient to inform them. - Please contact requesting surgeon's office via preferred method (i.e, phone, fax) to inform them of need for appointment prior to surgery.  Laurann Montana, PA-C  10/25/2017, 3:06 PM

## 2017-10-31 ENCOUNTER — Ambulatory Visit: Payer: Medicaid Other | Admitting: Physician Assistant

## 2017-12-06 NOTE — Progress Notes (Deleted)
HPI: FU CAD and hypertension. Renal Dopplers June 2014 normal. Cardiac catheterization January 2017 showed a 90% first marginal, occluded ramus, 50% ost LAD, 70% circumflex, 80% distal LAD and 65% right coronary artery; EF 25-30; LVEDP 40. Patient with history of noncompliance. Followed by Dr Everardo All for hyperaldosteronism. Echocardiogram repeated August 2018 and showed ejection fraction 45-50%, grade 1 diastolic dysfunction. Since last seen,   Current Outpatient Medications  Medication Sig Dispense Refill  . acetaminophen-codeine (TYLENOL #3) 300-30 MG tablet Take 1 tablet by mouth every 6 (six) hours as needed for moderate pain. 15 tablet 0  . amLODipine (NORVASC) 10 MG tablet Take 1 tablet (10 mg total) by mouth daily. 90 tablet 3  . aspirin EC 81 MG tablet Take 1 tablet (81 mg total) by mouth daily. 90 tablet 3  . atorvastatin (LIPITOR) 80 MG tablet Take 1 tablet (80 mg total) by mouth daily at 6 PM. 30 tablet 1  . furosemide (LASIX) 80 MG tablet TAKE ONE TABLET BY MOUTH TWICE DAILY 180 tablet 1  . hydrALAZINE (APRESOLINE) 100 MG tablet Take 1 tablet (100 mg total) by mouth 3 (three) times daily. 90 tablet 11  . isosorbide mononitrate (IMDUR) 60 MG 24 hr tablet Take 1 tablet (60 mg total) by mouth daily. 30 tablet 11  . labetalol (NORMODYNE) 200 MG tablet Take 2.5 tablets (500 mg total) by mouth 2 (two) times daily. 450 tablet 3  . lisinopril (PRINIVIL,ZESTRIL) 40 MG tablet Take 1 tablet (40 mg total) by mouth daily. 30 tablet 1  . Multiple Vitamin (MULTIVITAMIN WITH MINERALS) TABS tablet Take 1 tablet by mouth daily.    . nitroGLYCERIN (NITROSTAT) 0.4 MG SL tablet Place 1 tablet (0.4 mg total) under the tongue every 5 (five) minutes as needed. For chest pain 30 tablet 1  . potassium chloride SA (K-DUR,KLOR-CON) 20 MEQ tablet Take 2 tablets (40 mEq total) by mouth daily. 60 tablet 11  . spironolactone (ALDACTONE) 50 MG tablet Take 1 tablet (50 mg total) by mouth daily. 30 tablet 11    No current facility-administered medications for this visit.      Past Medical History:  Diagnosis Date  . CAD (coronary artery disease) 02/18/2009   Small OM occlusion with collaterals, 60% RCA, 40% LAD. April 2015    . Cardiomyopathy, nonischemic (HCC) 05/03/2016  . CHF (congestive heart failure) (HCC)   . CKD (chronic kidney disease)   . History of syncope 08/2008   Secondary to hypertension  . Hypertension, essential, benign    a. renal art [place] (6/14): no RAS  . Hypokalemia 05/03/2016  . Noncompliance   . Nonischemic cardiomyopathy (HCC)    EF 45%  . Obesity   . Sleep apnea   . Tobacco abuse     Past Surgical History:  Procedure Laterality Date  . CARDIAC CATHETERIZATION N/A 12/22/2015   Procedure: Left Heart Cath and Coronary Angiography;  Surgeon: Lyn Records, MD;  Location[facility] INVASIVE CV LAB;  Service: Cardiovascular;  Laterality: N/A;  . IR US GUIDE VASC ACCESS RIGHT  04/14/2017  . IR VENOGRAM ADRENAL BI  04/14/2017  . IR VENOGRAM RENAL BI  04/14/2017  . IR VENOUS SAMPLING  04/14/2017  . IR VENOUS SAMPLING  04/14/2017  . LEFT HEART CATHETERIZATION WITH CORONARY ANGIOGRAM N/A 02/21/2014   Procedure: LEFT HEART CATHETERIZATION WITH CORONARY ANGIOGRAM;  Surgeon: Kathleene Hazel, MD;  Location: [facility] CATH LAB;  Service: Cardiovascular;  Laterality: N/A;  . None  Social History   Socioeconomic History  . Marital status: Single    Spouse name: Not on file  . Number of children: 5  . Years of education: Not on file  . Highest education level: Not on file  Social Needs  . Financial resource strain: Not on file  . Food insecurity - worry: Not on file  . Food insecurity - inability: Not on file  . Transportation needs - medical: Not on file  . Transportation needs - non-medical: Not on file  Occupational History  . Occupation: [employer]    Employer: [employer]  Tobacco Use  . Smoking status: Former Smoker    Packs/day: 0.10    Years: 32.00    Pack  years: 3.20    Types: Cigarettes  . Smokeless tobacco: Never Used  . Tobacco comment: Started smoking at age 13 (77).  Cutting back  Substance and Sexual Activity  . Alcohol use: No    Alcohol/week: 0.0 oz  . Drug use: No  . Sexual activity: Not Currently  Other Topics Concern  . Not on file  Social History Narrative   Lives with girlfriend.    Family History  Problem Relation Age of Onset  . Lupus Mother   . Heart disease Maternal Grandfather   . Coronary artery disease Neg Hx   . Adrenal disorder Neg Hx     ROS: no fevers or chills, productive cough, hemoptysis, dysphasia, odynophagia, melena, hematochezia, dysuria, hematuria, rash, seizure activity, orthopnea, PND, pedal edema, claudication. Remaining systems are negative.  Physical Exam: Well-developed well-nourished in no acute distress.  Skin is warm and dry.  HEENT is normal.  Neck is supple.  Chest is clear to auscultation with normal expansion.  Cardiovascular exam is regular rate and rhythm.  Abdominal exam nontender or distended. No masses palpated. Extremities show no edema. neuro grossly intact  ECG- personally reviewed  A/P  1 coronary artery disease-patient appears to be doing reasonably well.  Continue aspirin and statin.  2 hypertension-blood pressure is controlled.  Continue present medications.  Check potassium and renal function.   3 cardiomyopathy-plan to continue ACE inhibitor and beta-blocker.  LV function improving on most recent echocardiogram.  Previous reduced LV function likely hypertensive mediated.  4 hyperaldosteronism-managed by Dr. Everardo All.  5 hyperlipidemia-continue statin.  Check lipids and liver.  6 chronic stage III kidney disease-followed by nephrology.  Olga Millers, MD

## 2017-12-13 ENCOUNTER — Encounter: Payer: Self-pay | Admitting: *Deleted

## 2017-12-13 ENCOUNTER — Ambulatory Visit: Payer: Medicaid Other | Admitting: Cardiology

## 2017-12-20 ENCOUNTER — Ambulatory Visit: Payer: Medicaid Other | Admitting: Physician Assistant

## 2017-12-21 ENCOUNTER — Emergency Department (HOSPITAL_COMMUNITY): Payer: Medicaid Other

## 2017-12-21 ENCOUNTER — Encounter (HOSPITAL_COMMUNITY): Payer: Self-pay | Admitting: *Deleted

## 2017-12-21 ENCOUNTER — Inpatient Hospital Stay (HOSPITAL_COMMUNITY)
Admission: EM | Admit: 2017-12-21 | Discharge: 2017-12-24 | DRG: 305 | Disposition: A | Discharge status: Home / Self Care | Payer: Medicaid Other | Attending: Nephrology | Admitting: Nephrology

## 2017-12-21 ENCOUNTER — Other Ambulatory Visit: Payer: Self-pay

## 2017-12-21 ENCOUNTER — Emergency Department (HOSPITAL_BASED_OUTPATIENT_CLINIC_OR_DEPARTMENT_OTHER)
Admit: 2017-12-21 | Discharge: 2017-12-21 | Disposition: A | Discharge status: Home / Self Care | Payer: Medicaid Other | Attending: Emergency Medicine | Admitting: Emergency Medicine

## 2017-12-21 DIAGNOSIS — M7989 Other specified soft tissue disorders: Secondary | ICD-10-CM | POA: Diagnosis not present

## 2017-12-21 DIAGNOSIS — I248 Other forms of acute ischemic heart disease: Secondary | ICD-10-CM | POA: Diagnosis present

## 2017-12-21 DIAGNOSIS — Z6835 Body mass index (BMI) 35.0-35.9, adult: Secondary | ICD-10-CM

## 2017-12-21 DIAGNOSIS — Z87891 Personal history of nicotine dependence: Secondary | ICD-10-CM

## 2017-12-21 DIAGNOSIS — R778 Other specified abnormalities of plasma proteins: Secondary | ICD-10-CM

## 2017-12-21 DIAGNOSIS — R748 Abnormal levels of other serum enzymes: Secondary | ICD-10-CM

## 2017-12-21 DIAGNOSIS — G4733 Obstructive sleep apnea (adult) (pediatric): Secondary | ICD-10-CM | POA: Diagnosis present

## 2017-12-21 DIAGNOSIS — I252 Old myocardial infarction: Secondary | ICD-10-CM

## 2017-12-21 DIAGNOSIS — N183 Chronic kidney disease, stage 3 (moderate): Secondary | ICD-10-CM | POA: Diagnosis present

## 2017-12-21 DIAGNOSIS — I428 Other cardiomyopathies: Secondary | ICD-10-CM | POA: Diagnosis present

## 2017-12-21 DIAGNOSIS — Z8249 Family history of ischemic heart disease and other diseases of the circulatory system: Secondary | ICD-10-CM

## 2017-12-21 DIAGNOSIS — E669 Obesity, unspecified: Secondary | ICD-10-CM | POA: Diagnosis present

## 2017-12-21 DIAGNOSIS — I2510 Atherosclerotic heart disease of native coronary artery without angina pectoris: Secondary | ICD-10-CM

## 2017-12-21 DIAGNOSIS — J440 Chronic obstructive pulmonary disease with acute lower respiratory infection: Secondary | ICD-10-CM | POA: Diagnosis present

## 2017-12-21 DIAGNOSIS — Z7982 Long term (current) use of aspirin: Secondary | ICD-10-CM

## 2017-12-21 DIAGNOSIS — I161 Hypertensive emergency: Secondary | ICD-10-CM | POA: Diagnosis not present

## 2017-12-21 DIAGNOSIS — Z23 Encounter for immunization: Secondary | ICD-10-CM

## 2017-12-21 DIAGNOSIS — Z91128 Patient's intentional underdosing of medication regimen for other reason: Secondary | ICD-10-CM

## 2017-12-21 DIAGNOSIS — I130 Hypertensive heart and chronic kidney disease with heart failure and stage 1 through stage 4 chronic kidney disease, or unspecified chronic kidney disease: Secondary | ICD-10-CM | POA: Diagnosis present

## 2017-12-21 DIAGNOSIS — R7989 Other specified abnormal findings of blood chemistry: Secondary | ICD-10-CM

## 2017-12-21 DIAGNOSIS — Z79899 Other long term (current) drug therapy: Secondary | ICD-10-CM

## 2017-12-21 DIAGNOSIS — J209 Acute bronchitis, unspecified: Secondary | ICD-10-CM | POA: Diagnosis present

## 2017-12-21 DIAGNOSIS — I160 Hypertensive urgency: Secondary | ICD-10-CM

## 2017-12-21 DIAGNOSIS — N179 Acute kidney failure, unspecified: Secondary | ICD-10-CM | POA: Diagnosis present

## 2017-12-21 DIAGNOSIS — Z880 Allergy status to penicillin: Secondary | ICD-10-CM

## 2017-12-21 DIAGNOSIS — E876 Hypokalemia: Secondary | ICD-10-CM | POA: Diagnosis present

## 2017-12-21 DIAGNOSIS — E785 Hyperlipidemia, unspecified: Secondary | ICD-10-CM | POA: Diagnosis present

## 2017-12-21 DIAGNOSIS — I10 Essential (primary) hypertension: Secondary | ICD-10-CM | POA: Diagnosis present

## 2017-12-21 DIAGNOSIS — Z91013 Allergy to seafood: Secondary | ICD-10-CM

## 2017-12-21 DIAGNOSIS — I5042 Chronic combined systolic (congestive) and diastolic (congestive) heart failure: Secondary | ICD-10-CM | POA: Diagnosis present

## 2017-12-21 LAB — CBC
HEMATOCRIT: 43.8 % (ref 39.0–52.0)
HEMATOCRIT: 44.6 % (ref 39.0–52.0)
HEMOGLOBIN: 15 g/dL (ref 13.0–17.0)
Hemoglobin: 14.7 g/dL (ref 13.0–17.0)
MCH: 31.3 pg (ref 26.0–34.0)
MCH: 30.1 pg (ref 26.0–34.0)
MCHC: 34.2 g/dL (ref 30.0–36.0)
MCHC: 33 g/dL (ref 30.0–36.0)
MCV: 91.4 fL (ref 78.0–100.0)
MCV: 91.2 fL (ref 78.0–100.0)
Platelets: 301 10*3/uL (ref 150–400)
Platelets: 287 10*3/uL (ref 150–400)
RBC: 4.79 MIL/uL (ref 4.22–5.81)
RBC: 4.89 MIL/uL (ref 4.22–5.81)
RDW: 14.1 % (ref 11.5–15.5)
RDW: 14.1 % (ref 11.5–15.5)
WBC: 11.1 10*3/uL — AB (ref 4.0–10.5)
WBC: 9.9 10*3/uL (ref 4.0–10.5)

## 2017-12-21 LAB — BASIC METABOLIC PANEL
ANION GAP: 12 (ref 5–15)
BUN: 20 mg/dL (ref 6–20)
CALCIUM: 8.6 mg/dL — AB (ref 8.9–10.3)
CO2: 30 mmol/L (ref 22–32)
Chloride: 97 mmol/L — ABNORMAL LOW (ref 101–111)
Creatinine, Ser: 1.89 mg/dL — ABNORMAL HIGH (ref 0.61–1.24)
GFR, EST AFRICAN AMERICAN: 45 mL/min — AB (ref >60)
GFR, EST NON AFRICAN AMERICAN: 39 mL/min — AB (ref >60)
Glucose, Bld: 118 mg/dL — ABNORMAL HIGH (ref 65–99)
POTASSIUM: 2.9 mmol/L — AB (ref 3.5–5.1)
SODIUM: 139 mmol/L (ref 135–145)

## 2017-12-21 LAB — D-DIMER, QUANTITATIVE (NOT AT ARMC): D DIMER QUANT: 0.79 ug{FEU}/mL — AB (ref 0.00–0.50)

## 2017-12-21 LAB — TROPONIN I
TROPONIN I: 0.21 ng/mL — AB (ref <0.03)
Troponin I: 0.18 ng/mL (ref <0.03)

## 2017-12-21 LAB — CREATININE, SERUM
Creatinine, Ser: 1.85 mg/dL — ABNORMAL HIGH (ref 0.61–1.24)
GFR calc Af Amer: 47 mL/min — ABNORMAL LOW (ref >60)
GFR, EST NON AFRICAN AMERICAN: 40 mL/min — AB (ref >60)

## 2017-12-21 LAB — MAGNESIUM
MAGNESIUM: 1.8 mg/dL (ref 1.7–2.4)
Magnesium: 1.8 mg/dL (ref 1.7–2.4)

## 2017-12-21 LAB — HEMOGLOBIN A1C
HEMOGLOBIN A1C: 6.3 % — AB (ref 4.8–5.6)
MEAN PLASMA GLUCOSE: 134.11 mg/dL

## 2017-12-21 LAB — I-STAT TROPONIN, ED: Troponin i, poc: 0.16 ng/mL (ref 0.00–0.08)

## 2017-12-21 LAB — LIPID PANEL
CHOL/HDL RATIO: 5.2 ratio
CHOLESTEROL: 277 mg/dL — AB (ref 0–200)
HDL: 53 mg/dL (ref >40)
LDL Cholesterol: 184 mg/dL — ABNORMAL HIGH (ref 0–99)
Triglycerides: 202 mg/dL — ABNORMAL HIGH (ref <150)
VLDL: 40 mg/dL (ref 0–40)

## 2017-12-21 LAB — BRAIN NATRIURETIC PEPTIDE: B Natriuretic Peptide: 604.9 pg/mL — ABNORMAL HIGH (ref 0.0–100.0)

## 2017-12-21 MED ORDER — AZITHROMYCIN 250 MG PO TABS: 250.0000 mg | ORAL_TABLET | Freq: Every day | ORAL | Status: DC

## 2017-12-21 MED ORDER — GUAIFENESIN ER 600 MG PO TB12
600.0000 mg | ORAL_TABLET | Freq: Two times a day (BID) | ORAL | Status: DC
  Administered 2017-12-21 – 2017-12-24 (×6): 600 mg via ORAL
  Filled 2017-12-21 (×6): qty 1

## 2017-12-21 MED ORDER — ONDANSETRON HCL 4 MG/2ML IJ SOLN: 4.0000 mg | Freq: Four times a day (QID) | INTRAMUSCULAR | Status: DC | PRN

## 2017-12-21 MED ORDER — LABETALOL HCL 5 MG/ML IV SOLN
5.0000 mg | INTRAVENOUS | Status: DC | PRN
  Administered 2017-12-21: 5 mg via INTRAVENOUS
  Filled 2017-12-21: qty 4

## 2017-12-21 MED ORDER — LISINOPRIL 40 MG PO TABS
40.0000 mg | ORAL_TABLET | Freq: Every day | ORAL | Status: DC
  Administered 2017-12-21 – 2017-12-22 (×2): 40 mg via ORAL
  Filled 2017-12-21: qty 1
  Filled 2017-12-21: qty 2

## 2017-12-21 MED ORDER — ASPIRIN EC 81 MG PO TBEC
81.0000 mg | DELAYED_RELEASE_TABLET | Freq: Every day | ORAL | Status: DC
  Administered 2017-12-21 – 2017-12-24 (×4): 81 mg via ORAL
  Filled 2017-12-21 (×4): qty 1

## 2017-12-21 MED ORDER — IOPAMIDOL (ISOVUE-370) INJECTION 76%
150.0000 mL | Freq: Once | INTRAVENOUS | Status: AC | PRN
  Administered 2017-12-21: 75 mL via INTRAVENOUS

## 2017-12-21 MED ORDER — HYDRALAZINE HCL 20 MG/ML IJ SOLN
10.0000 mg | INTRAMUSCULAR | Status: DC | PRN
  Administered 2017-12-21: 10 mg via INTRAVENOUS
  Filled 2017-12-21: qty 1

## 2017-12-21 MED ORDER — NITROGLYCERIN IN D5W 200-5 MCG/ML-% IV SOLN
0.0000 ug/min | Freq: Once | INTRAVENOUS | Status: AC
  Administered 2017-12-21: 20 ug/min via INTRAVENOUS
  Filled 2017-12-21: qty 250

## 2017-12-21 MED ORDER — SPIRONOLACTONE 25 MG PO TABS
50.0000 mg | ORAL_TABLET | Freq: Every day | ORAL | Status: DC
  Administered 2017-12-22 – 2017-12-23 (×2): 50 mg via ORAL
  Filled 2017-12-21 (×2): qty 2
  Filled 2017-12-21 (×2): qty 1

## 2017-12-21 MED ORDER — IPRATROPIUM-ALBUTEROL 0.5-2.5 (3) MG/3ML IN SOLN: 3.0000 mL | RESPIRATORY_TRACT | Status: DC | PRN

## 2017-12-21 MED ORDER — POTASSIUM CHLORIDE CRYS ER 20 MEQ PO TBCR
40.0000 meq | EXTENDED_RELEASE_TABLET | Freq: Once | ORAL | Status: AC
  Administered 2017-12-21: 40 meq via ORAL
  Filled 2017-12-21: qty 2

## 2017-12-21 MED ORDER — ISOSORBIDE MONONITRATE ER 60 MG PO TB24
60.0000 mg | ORAL_TABLET | Freq: Every day | ORAL | Status: DC
  Administered 2017-12-21 – 2017-12-24 (×4): 60 mg via ORAL
  Filled 2017-12-21 (×4): qty 1

## 2017-12-21 MED ORDER — ACETAMINOPHEN 650 MG RE SUPP: 650.0000 mg | Freq: Four times a day (QID) | RECTAL | Status: DC | PRN

## 2017-12-21 MED ORDER — AZITHROMYCIN 250 MG PO TABS
500.0000 mg | ORAL_TABLET | Freq: Every day | ORAL | Status: AC
  Administered 2017-12-21: 500 mg via ORAL
  Filled 2017-12-21: qty 2

## 2017-12-21 MED ORDER — FUROSEMIDE 80 MG PO TABS
80.0000 mg | ORAL_TABLET | Freq: Two times a day (BID) | ORAL | Status: DC
  Administered 2017-12-21 – 2017-12-22 (×3): 80 mg via ORAL
  Filled 2017-12-21: qty 4
  Filled 2017-12-21 (×2): qty 1

## 2017-12-21 MED ORDER — ATORVASTATIN CALCIUM 80 MG PO TABS
80.0000 mg | ORAL_TABLET | Freq: Every day | ORAL | Status: DC
  Administered 2017-12-22 – 2017-12-23 (×2): 80 mg via ORAL
  Filled 2017-12-21 (×3): qty 1

## 2017-12-21 MED ORDER — NICARDIPINE HCL IN NACL 20-0.86 MG/200ML-% IV SOLN
5.0000 mg/h | Freq: Once | INTRAVENOUS | Status: AC
  Administered 2017-12-21: 15 mg via INTRAVENOUS

## 2017-12-21 MED ORDER — PNEUMOCOCCAL VAC POLYVALENT 25 MCG/0.5ML IJ INJ
0.5000 mL | INJECTION | INTRAMUSCULAR | Status: AC
  Administered 2017-12-22: 0.5 mL via INTRAMUSCULAR
  Filled 2017-12-21: qty 0.5

## 2017-12-21 MED ORDER — NITROGLYCERIN 0.4 MG SL SUBL: 0.4000 mg | SUBLINGUAL_TABLET | SUBLINGUAL | Status: DC | PRN

## 2017-12-21 MED ORDER — AMLODIPINE BESYLATE 10 MG PO TABS
10.0000 mg | ORAL_TABLET | Freq: Every day | ORAL | Status: DC
  Administered 2017-12-21 – 2017-12-24 (×4): 10 mg via ORAL
  Filled 2017-12-21: qty 1
  Filled 2017-12-21: qty 2
  Filled 2017-12-21 (×2): qty 1

## 2017-12-21 MED ORDER — NICARDIPINE HCL IN NACL 20-0.86 MG/200ML-% IV SOLN
INTRAVENOUS | Status: AC
  Administered 2017-12-21: 15 mg via INTRAVENOUS
  Filled 2017-12-21: qty 200

## 2017-12-21 MED ORDER — IPRATROPIUM-ALBUTEROL 0.5-2.5 (3) MG/3ML IN SOLN
3.0000 mL | Freq: Four times a day (QID) | RESPIRATORY_TRACT | Status: DC
  Administered 2017-12-21: 3 mL via RESPIRATORY_TRACT
  Filled 2017-12-21: qty 3

## 2017-12-21 MED ORDER — NICARDIPINE HCL IN NACL 20-0.86 MG/200ML-% IV SOLN
5.0000 mg/h | Freq: Once | INTRAVENOUS | Status: AC
  Administered 2017-12-21: 5 mg/h via INTRAVENOUS
  Filled 2017-12-21: qty 200

## 2017-12-21 MED ORDER — ONDANSETRON HCL 4 MG PO TABS: 4.0000 mg | ORAL_TABLET | Freq: Four times a day (QID) | ORAL | Status: DC | PRN

## 2017-12-21 MED ORDER — ACETAMINOPHEN 325 MG PO TABS
650.0000 mg | ORAL_TABLET | Freq: Four times a day (QID) | ORAL | Status: DC | PRN
  Administered 2017-12-21 – 2017-12-22 (×2): 650 mg via ORAL
  Filled 2017-12-21 (×2): qty 2

## 2017-12-21 MED ORDER — ACETAMINOPHEN-CODEINE #3 300-30 MG PO TABS
1.0000 | ORAL_TABLET | Freq: Four times a day (QID) | ORAL | Status: DC | PRN
Start: 2017-12-21 — End: 2017-12-24
  Administered 2017-12-22: 1 via ORAL
  Filled 2017-12-21: qty 1

## 2017-12-21 MED ORDER — FUROSEMIDE 10 MG/ML IJ SOLN
80.0000 mg | Freq: Once | INTRAMUSCULAR | Status: AC
  Administered 2017-12-21: 80 mg via INTRAVENOUS
  Filled 2017-12-21: qty 8

## 2017-12-21 MED ORDER — CARVEDILOL 6.25 MG PO TABS
6.2500 mg | ORAL_TABLET | Freq: Two times a day (BID) | ORAL | Status: DC
  Administered 2017-12-22: 6.25 mg via ORAL
  Filled 2017-12-21: qty 1

## 2017-12-21 MED ORDER — ENOXAPARIN SODIUM 40 MG/0.4ML ~~LOC~~ SOLN: 40.0000 mg | SUBCUTANEOUS | Status: DC

## 2017-12-21 MED ORDER — HYDRALAZINE HCL 50 MG PO TABS
100.0000 mg | ORAL_TABLET | Freq: Three times a day (TID) | ORAL | Status: DC
  Administered 2017-12-21 – 2017-12-24 (×8): 100 mg via ORAL
  Filled 2017-12-21 (×8): qty 2

## 2017-12-21 MED ORDER — HYDRALAZINE HCL 100 MG PO TABS: 100.0000 mg | ORAL_TABLET | Freq: Three times a day (TID) | ORAL | Status: DC

## 2017-12-21 NOTE — ED Notes (Signed)
Paged admitting regarding pt's BP 210/109

## 2017-12-21 NOTE — ED Provider Notes (Signed)
[facility] EMERGENCY DEPARTMENT Provider Note   CSN: [id number] Arrival date & time: 12/21/17  1135     History   Chief Complaint Chief Complaint  Patient presents with  . Shortness of Breath  . Cough    HPI [name] is a 52 y.o. male.  Patient is a 52 year old male with a history of hypertension, coronary artery disease, chronic kidney disease and CHF who presents with shortness of breath.  He states he has been out of his blood pressure medicines for about 3-4 days.  He got a refill today but has not yet taken them.  He has had a cough for about 2-3 weeks.  It is productive of some green yellow sputum.  He denies any nasal congestion.  He feels he has had some subjective fevers.  He reports a 2-3-day history of shortness of breath and worsening orthopnea.  His wife feels like his legs are more swollen than they normally are.  He had fleeting episodes of chest pain yesterday but no chest pain today.  He has not taken any medications for the symptoms today.      Past Medical History:  Diagnosis Date  . CAD (coronary artery disease) 02/18/2009   Small OM occlusion with collaterals, 60% RCA, 40% LAD. April 2015    . Cardiomyopathy, nonischemic (HCC) 05/03/2016  . CHF (congestive heart failure) (HCC)   . CKD (chronic kidney disease)   . History of syncope 08/2008   Secondary to hypertension  . Hypertension, essential, benign    a. renal art [place] (6/14): no RAS  . Hypokalemia 05/03/2016  . Noncompliance   . Nonischemic cardiomyopathy (HCC)    EF 45%  . Obesity   . Sleep apnea   . Tobacco abuse     Patient Active Problem List   Diagnosis Date Noted  . Hyperaldosteronism (HCC) 03/20/2017  . Renal failure 03/17/2017  . Prediabetes 01/21/2017  . Weight gain 01/21/2017  . Noncompliance   . Sleep apnea   . Chronic combined systolic (congestive) and diastolic (congestive) heart failure (HCC) 08/15/2016  . Hypertensive crisis 08/15/2016  . Hypokalemia  05/03/2016  . Cardiomyopathy, nonischemic (HCC) 05/03/2016  . Sinus bradycardia 05/03/2016  . ADHF (acute decompensated heart failure) 04/30/2016  . OSA (obstructive sleep apnea)   . Pain in the chest   . Cranial nerve VI palsy   . Chest pain   . NSTEMI (non-ST elevated myocardial infarction) (HCC)   . Abnormal stress test   . SOB (shortness of breath)   . Hypertensive emergency 12/15/2015  . Elevated troponin 12/15/2015  . Pulmonary edema 12/15/2015  . CKD (chronic kidney disease), stage III (HCC) 12/15/2015  . Hepatitis 12/15/2015  . Hypertensive urgency, malignant 09/08/2014  . Erectile dysfunction 10/29/2013  . OBSTRUCTIVE SLEEP APNEA 11/24/2009  . OBESITY 02/18/2009  . Essential hypertension 02/18/2009  . Diffuse Moderate to Severe CAD 02/18/2009  . CHEST PAIN-UNSPECIFIED 02/18/2009  . SYNCOPE, HX OF 02/18/2009    Past Surgical History:  Procedure Laterality Date  . CARDIAC CATHETERIZATION N/A 12/22/2015   Procedure: Left Heart Cath and Coronary Angiography;  Surgeon: Lyn Records, MD;  Location: [facility] INVASIVE CV LAB;  Service: Cardiovascular;  Laterality: N/A;  . IR US GUIDE VASC ACCESS RIGHT  04/14/2017  . IR VENOGRAM ADRENAL BI  04/14/2017  . IR VENOGRAM RENAL BI  04/14/2017  . IR VENOUS SAMPLING  04/14/2017  . IR VENOUS SAMPLING  04/14/2017  . LEFT HEART CATHETERIZATION WITH CORONARY ANGIOGRAM  N/A 02/21/2014   Procedure: LEFT HEART CATHETERIZATION WITH CORONARY ANGIOGRAM;  Surgeon: Kathleene Hazel, MD;  Location: [facility] CATH LAB;  Service: Cardiovascular;  Laterality: N/A;  . None         Home Medications    Prior to Admission medications   Medication Sig Start Date End Date Taking? Authorizing Provider  acetaminophen-codeine (TYLENOL #3) 300-30 MG tablet Take 1 tablet by mouth every 6 (six) hours as needed for moderate pain. 09/21/17  Yes Massie Maroon, FNP  amLODipine (NORVASC) 10 MG tablet Take 1 tablet (10 mg total) by mouth daily. 09/21/17  Yes Massie Maroon, FNP  aspirin EC 81 MG tablet Take 1 tablet (81 mg total) by mouth daily. 12/23/15  Yes Leatha Gilding, MD  furosemide (LASIX) 80 MG tablet TAKE ONE TABLET BY MOUTH TWICE DAILY 07/26/17  Yes Crenshaw, Madolyn Frieze, MD  hydrALAZINE (APRESOLINE) 100 MG tablet Take 1 tablet (100 mg total) by mouth 3 (three) times daily. 01/27/17  Yes Lewayne Bunting, MD  isosorbide mononitrate (IMDUR) 60 MG 24 hr tablet Take 1 tablet (60 mg total) by mouth daily. 01/27/17  Yes Lewayne Bunting, MD  labetalol (NORMODYNE) 200 MG tablet Take 2.5 tablets (500 mg total) by mouth 2 (two) times daily. 03/29/17  Yes Lewayne Bunting, MD  lisinopril (PRINIVIL,ZESTRIL) 40 MG tablet Take 1 tablet (40 mg total) by mouth daily. 12/23/15  Yes Leatha Gilding, MD  Multiple Vitamin (MULTIVITAMIN WITH MINERALS) TABS tablet Take 1 tablet by mouth daily.   Yes [provider]  potassium chloride SA (K-DUR,KLOR-CON) 20 MEQ tablet Take 2 tablets (40 mEq total) by mouth daily. 09/23/17  Yes Romero Belling, MD  spironolactone (ALDACTONE) 50 MG tablet Take 1 tablet (50 mg total) by mouth daily. 09/23/17  Yes Romero Belling, MD  atorvastatin (LIPITOR) 80 MG tablet Take 1 tablet (80 mg total) by mouth daily at 6 PM. Patient not taking: Reported on 12/21/2017 12/23/15   Leatha Gilding, MD  nitroGLYCERIN (NITROSTAT) 0.4 MG SL tablet Place 1 tablet (0.4 mg total) under the tongue every 5 (five) minutes as needed. For chest pain 12/23/15   Leatha Gilding, MD    Family History Family History  Problem Relation Age of Onset  . Lupus Mother   . Heart disease Maternal Grandfather   . Coronary artery disease Neg Hx   . Adrenal disorder Neg Hx     Social History Social History   Tobacco Use  . Smoking status: Former Smoker    Packs/day: 0.10    Years: 32.00    Pack years: 3.20    Types: Cigarettes  . Smokeless tobacco: Never Used  . Tobacco comment: Started smoking at age 18 (11).  Cutting back  Substance Use Topics  .  Alcohol use: No    Alcohol/week: 0.0 oz  . Drug use: No     Allergies   Penicillins and Shrimp [shellfish allergy]   Review of Systems Review of Systems  Constitutional: Positive for fatigue. Negative for chills, diaphoresis and fever.  HENT: Negative for congestion, rhinorrhea and sneezing.   Eyes: Negative.   Respiratory: Positive for cough and shortness of breath. Negative for chest tightness.   Cardiovascular: Positive for leg swelling. Negative for chest pain.  Gastrointestinal: Negative for abdominal pain, blood in stool, diarrhea, nausea and vomiting.  Genitourinary: Negative for difficulty urinating, flank pain, frequency and hematuria.  Musculoskeletal: Negative for arthralgias and back pain.  Skin: Negative for rash.  Neurological: Negative for dizziness, speech difficulty, weakness, numbness and headaches.     Physical Exam Updated Vital Signs BP (!) 223/88   Pulse (!) 105   Temp 98.4 F (36.9 C) (Oral)   Resp (!) 27   SpO2 95%   Physical Exam  Constitutional: He is oriented to person, place, and time. He appears well-developed and well-nourished.  HENT:  Head: Normocephalic and atraumatic.  Eyes: Pupils are equal, round, and reactive to light.  Neck: Normal range of motion. Neck supple.  Cardiovascular: Regular rhythm and normal heart sounds. Tachycardia present.  Pulmonary/Chest: Effort normal. Tachypnea noted. No respiratory distress. He has no wheezes. He has rales in the right lower field and the left lower field. He exhibits no tenderness.  Abdominal: Soft. Bowel sounds are normal. There is no tenderness. There is no rebound and no guarding.  Musculoskeletal: Normal range of motion.       Right lower leg: He exhibits no edema.       Left lower leg: He exhibits edema (1+ pitting edema).  Lymphadenopathy:    He has no cervical adenopathy.  Neurological: He is alert and oriented to person, place, and time.  Skin: Skin is warm and dry. No rash noted.    Psychiatric: He has a normal mood and affect.     ED Treatments / Results  Labs (all labs ordered are listed, but only abnormal results are displayed) Labs Reviewed  BASIC METABOLIC PANEL - Abnormal; Notable for the following components:      Result Value   Potassium 2.9 (*)    Chloride 97 (*)    Glucose, Bld 118 (*)    Creatinine, Ser 1.89 (*)    Calcium 8.6 (*)    GFR calc non Af Amer 39 (*)    GFR calc Af Amer 45 (*)    All other components within normal limits  CBC - Abnormal; Notable for the following components:   WBC 11.1 (*)    All other components within normal limits  BRAIN NATRIURETIC PEPTIDE - Abnormal; Notable for the following components:   B Natriuretic Peptide 604.9 (*)    All other components within normal limits  D-DIMER, QUANTITATIVE (NOT AT [facility]) - Abnormal; Notable for the following components:   D-Dimer, Quant 0.79 (*)    All other components within normal limits  TROPONIN I - Abnormal; Notable for the following components:   Troponin I 0.18 (*)    All other components within normal limits  I-STAT TROPONIN, ED - Abnormal; Notable for the following components:   Troponin i, poc 0.16 (*)    All other components within normal limits  MAGNESIUM    EKG  EKG Interpretation  Date/Time:  Wednesday December 21 2017 11:40:22 EST Ventricular Rate:  103 PR Interval:  146 QRS Duration: 92 QT Interval:  396 QTC Calculation: 518 R Axis:   51 Text Interpretation:  Sinus tachycardia Right atrial enlargement Left ventricular hypertrophy with repolarization abnormality Abnormal ECG Confirmed by Rolan Bucco [phone]) on 12/21/2017 12:20:01 PM       Radiology Ct Angio Chest Pe W/cm &/or Wo Cm  Result Date: 12/21/2017 CLINICAL DATA:  Shortness of breath. Chest discomfort. Positive D-dimer. EXAM: CT ANGIOGRAPHY CHEST WITH CONTRAST TECHNIQUE: Multidetector CT imaging of the chest was performed using the standard protocol during bolus administration of intravenous  contrast. Multiplanar CT image reconstructions and MIPs were obtained to evaluate the vascular anatomy. CONTRAST:  75mL ISOVUE-370 IOPAMIDOL (ISOVUE-370) INJECTION 76% COMPARISON:  Chest x-ray dated  12/21/2017 FINDINGS: Cardiovascular: No pulmonary emboli. Borderline cardiomegaly with left ventricular hypertrophy and coronary artery calcifications. Mediastinum/Nodes: No adenopathy. 8 mm rim calcified nodule in the right lobe of the thyroid gland. Lungs/Pleura: No infiltrates or effusions. Peribronchial thickening. Upper Abdomen: Severe aortic atherosclerosis with a large eccentric plaque in the abdominal aorta just below the level of the renal arteries creating abdominal aortic stenosis. Musculoskeletal: No acute abnormality. Osteophytes fuse the spine from T3 through L1. Review of the MIP images confirms the above findings. IMPRESSION: 1. No pulmonary emboli. 2. Prominent peribronchial thickening consistent with bronchitis. 3. Cardiomegaly with left ventricular hypertrophy. 4. Severe atherosclerosis of the abdominal aorta with abdominal aortic stenosis just below the renal arteries. 5. Coronary artery calcification. Electronically Signed   By: Francene Boyers M.D.   On: 12/21/2017 16:06   Dg Chest Port 1 View  Result Date: 12/21/2017 CLINICAL DATA:  Shortness of breath.  Cough. EXAM: PORTABLE CHEST 1 VIEW COMPARISON:  08/15/2016 FINDINGS: The heart size and mediastinal contours are within normal limits. Both lungs are clear. The visualized skeletal structures are unremarkable. IMPRESSION: No active disease. Electronically Signed   By: Signa Kell M.D.   On: 12/21/2017 14:00    Procedures Procedures (including critical care time)  Medications Ordered in ED Medications  nitroGLYCERIN 50 mg in dextrose 5 % 250 mL (0.2 mg/mL) infusion (0 mcg/min Intravenous Stopped 12/21/17 1436)  potassium chloride SA (K-DUR,KLOR-CON) CR tablet 40 mEq (40 mEq Oral Given 12/21/17 1442)  nicardipine (CARDENE) 20mg  in 0.86%  saline IV infusion (0.1 mg/ml) (15 mg/hr Intravenous Rate/Dose Change 12/21/17 1525)  furosemide (LASIX) injection 80 mg (80 mg Intravenous Given 12/21/17 1438)  iopamidol (ISOVUE-370) 76 % injection 150 mL (75 mLs Intravenous Contrast Given 12/21/17 1534)     Initial Impression / Assessment and Plan / ED Course  I have reviewed the triage vital signs and the nursing notes.  Pertinent labs & imaging results that were available during my care of the patient were reviewed by me and considered in my medical decision making (see chart for details).     Patient is a 52 year old male with a history of poorly controlled hypertension who presents with shortness of breath.  He is also had a productive cough for the last 2-3 weeks.  There is no evidence of pneumonia.  He has some mild ST depression on EKG which is slightly more prominent than his prior EKGs.  His his troponin is mildly bumped but he does not report any chest pain.  He does have shortness of breath.  I feel his symptoms are consistent with a hypertensive emergency.  His blood pressure was markedly elevated on arrival.  He was initially started on nitroglycerin given some suggestions of fluid overload.  However this did not improve his blood pressure and he was switched to a Cardene drip.  His blood pressure is improving with this and currently is 173/99.  His potassium is low and he was given potassium replacement in the ED.  He has an elevation of his creatinine but it is not significantly different than his prior values.  He has some unilateral leg swelling and a vascular ultrasound was performed which showed no evidence of DVT.  His d-dimer was mildly elevated and therefore a CT of his chest was performed which shows no evidence of a pulmonary embolus.  His shortness of breath has improved with treatment in the ED.  I will consult the hospitalist for admission.  CRITICAL CARE Performed by: Rolan Bucco  Total critical care time: 70  minutes Critical care time was exclusive of separately billable procedures and treating other patients. Critical care was necessary to treat or prevent imminent or life-threatening deterioration. Critical care was time spent personally by me on the following activities: development of treatment plan with patient and/or surrogate as well as nursing, discussions with consultants, evaluation of patient's response to treatment, examination of patient, obtaining history from patient or surrogate, ordering and performing treatments and interventions, ordering and review of laboratory studies, ordering and review of radiographic studies, pulse oximetry and re-evaluation of patient's condition.   Final Clinical Impressions(s) / ED Diagnoses   Final diagnoses:  Hypertensive emergency  Elevated troponin    ED Discharge Orders    None       Rolan Bucco, MD 12/21/17 [phone]

## 2017-12-21 NOTE — Progress Notes (Deleted)
History and Physical    [name]:[id number] DOB: 01-22-1966 DOA: 12/21/2017  PCP: Massie Maroon, FNP  Patient coming from: home  Chief Complaint: SOB, mild chest discomfort and cough  HPI: [name] is a 52 y.o. male with medical history significant of CAD, HTN, CHF, CKD, OSA, obesity who presented to the ED with c/o SOB, mild chest discomfort and cough. Patient reported that onset of cough was approximately 2 weeks ago and cough was productive, but the last few days it turned into the dry cough. PAtient reported that during the last few days he has not been taking his BP medications and few days ago started having SOB with chest pain.  ED Course: Upper arrival to the emergency department patient had a blood pressure of 241/151 mmHg, pulse 105. On PE patient was noticed to have left lower extremity swelling and underwent Doppler ultrasound which was negative for DVT. 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  Patient was placed on Cardene drip and blood pressure responded nicely reducing to 151/94 mmHg.  I Review of Systems: As per HPI, also patient c/o abdominal distension and bouts of diarrhea, claudication of the lower extremities, hesitancy on urination, otherwise all other systems reviewed and  are negative.  Ambulatory Status: able to ambulate independently  Past Medical History:  Diagnosis Date  . CAD (coronary artery disease) 02/18/2009   Small OM occlusion with collaterals, 60% RCA, 40% LAD. April 2015    . Cardiomyopathy, nonischemic (HCC) 05/03/2016  . CHF (congestive heart failure) (HCC)   . CKD (chronic kidney disease)   . History of syncope 08/2008   Secondary to hypertension  . Hypertension, essential, benign    a. renal art [place] (6/14): no RAS  . Hypokalemia 05/03/2016  . Noncompliance     . Nonischemic cardiomyopathy (HCC)    EF 45%  . Obesity   . Sleep apnea   . Tobacco abuse     Past Surgical History:  Procedure Laterality Date  . CARDIAC CATHETERIZATION N/A 12/22/2015   Procedure: Left Heart Cath and Coronary Angiography;  Surgeon: Lyn Records, MD;  Location: [facility] INVASIVE CV LAB;  Service: Cardiovascular;  Laterality: N/A;  . IR US GUIDE VASC ACCESS RIGHT  04/14/2017  . IR VENOGRAM ADRENAL BI  04/14/2017  . IR VENOGRAM RENAL BI  04/14/2017  . IR VENOUS SAMPLING  04/14/2017  . IR VENOUS SAMPLING  04/14/2017  . LEFT HEART CATHETERIZATION WITH CORONARY ANGIOGRAM N/A 02/21/2014   Procedure: LEFT HEART CATHETERIZATION WITH CORONARY ANGIOGRAM;  Surgeon: Kathleene Hazel, MD;  Location: [facility] CATH LAB;  Service: Cardiovascular;  Laterality: N/A;  . None      Social History   Socioeconomic History  . Marital status: Single    Spouse name: Not on file  . Number of children: 5  . Years of education: Not on file  . Highest education level: Not on file  Social Needs  . Financial resource strain: Not on file  . Food insecurity - worry: Not on file  . Food insecurity - inability: Not on file  . Transportation needs - medical: Not on file  . Transportation needs - non-medical: Not on file  Occupational History  . Occupation: [employer]    Employer: [employer]  Tobacco Use  . Smoking status: Former Smoker  Packs/day: 0.10    Years: 32.00    Pack years: 3.20    Types: Cigarettes  . Smokeless tobacco: Never Used  . Tobacco comment: Started smoking at age 28 (74).  Cutting back  Substance and Sexual Activity  . Alcohol use: No    Alcohol/week: 0.0 oz  . Drug use: No  . Sexual activity: Not Currently  Other Topics Concern  . Not on file  Social History Narrative   Lives with girlfriend.    Allergies  Allergen Reactions  . Penicillins Other (See Comments)    Makes his throat itch really bad, and feels like it is closing up.  Has patient had a PCN  reaction causing immediate rash, facial/tongue/throat swelling, SOB or lightheadedness with hypotension: Yes Has patient had a PCN reaction causing severe rash involving mucus membranes or skin necrosis: No Has patient had a PCN reaction that required hospitalization: Yes Has patient had a PCN reaction occurring within the last 10 years: No If all of the above answers are "NO", then may proceed with Cephalospo  . Shrimp [Shellfish Allergy]     Throat swells    Family History  Problem Relation Age of Onset  . Lupus Mother   . Heart disease Maternal Grandfather   . Coronary artery disease Neg Hx   . Adrenal disorder Neg Hx     Prior to Admission medications   Medication Sig Start Date End Date Taking? Authorizing Provider  acetaminophen-codeine (TYLENOL #3) 300-30 MG tablet Take 1 tablet by mouth every 6 (six) hours as needed for moderate pain. 09/21/17  Yes Massie Maroon, FNP  amLODipine (NORVASC) 10 MG tablet Take 1 tablet (10 mg total) by mouth daily. 09/21/17  Yes Massie Maroon, FNP  aspirin EC 81 MG tablet Take 1 tablet (81 mg total) by mouth daily. 12/23/15  Yes Leatha Gilding, MD  furosemide (LASIX) 80 MG tablet TAKE ONE TABLET BY MOUTH TWICE DAILY 07/26/17  Yes Crenshaw, Madolyn Frieze, MD  hydrALAZINE (APRESOLINE) 100 MG tablet Take 1 tablet (100 mg total) by mouth 3 (three) times daily. 01/27/17  Yes Lewayne Bunting, MD  isosorbide mononitrate (IMDUR) 60 MG 24 hr tablet Take 1 tablet (60 mg total) by mouth daily. 01/27/17  Yes Lewayne Bunting, MD  labetalol (NORMODYNE) 200 MG tablet Take 2.5 tablets (500 mg total) by mouth 2 (two) times daily. 03/29/17  Yes Lewayne Bunting, MD  lisinopril (PRINIVIL,ZESTRIL) 40 MG tablet Take 1 tablet (40 mg total) by mouth daily. 12/23/15  Yes Leatha Gilding, MD  Multiple Vitamin (MULTIVITAMIN WITH MINERALS) TABS tablet Take 1 tablet by mouth daily.   Yes [provider]  potassium chloride SA (K-DUR,KLOR-CON) 20 MEQ tablet Take 2  tablets (40 mEq total) by mouth daily. 09/23/17  Yes Romero Belling, MD  spironolactone (ALDACTONE) 50 MG tablet Take 1 tablet (50 mg total) by mouth daily. 09/23/17  Yes Romero Belling, MD  atorvastatin (LIPITOR) 80 MG tablet Take 1 tablet (80 mg total) by mouth daily at 6 PM. Patient not taking: Reported on 12/21/2017 12/23/15   Leatha Gilding, MD  nitroGLYCERIN (NITROSTAT) 0.4 MG SL tablet Place 1 tablet (0.4 mg total) under the tongue every 5 (five) minutes as needed. For chest pain 12/23/15   Leatha Gilding, MD    Physical Exam: Vitals:   12/21/17 1520 12/21/17 1630 12/21/17 1700 12/21/17 1730  BP: (!) 223/88 (!) 171/103 (!) 157/94 (!) 152/82  Pulse: (!) 105 (!) 104 (!) 104  97  Resp: (!) 27 (!) 22 20 (!) 25  Temp:      TempSrc:      SpO2: 95% 97% 94% 98%     General: Appears calm and comfortable Eyes: PERRLA, EOMI, normal lids, iris ENT:  grossly normal hearing, lips & tongue, mucous membranes moist and intact Neck: no lymphoadenopathy, masses or thyromegaly Cardiovascular: RRR, no m/r/g. No JVD, carotid bruits. Mild assymetric Left sided LE edema.  Respiratory: bilateral wheezes, and rhonchi. Normal respiratory effort. No accessory muscle use observed Abdomen: distended, non-tender, unable to appreciate organomegaly or masses. BS present in all quadrants Skin: no rash, ulcers or induration seen on limited exam Musculoskeletal: grossly normal tone BUE/BLE, good ROM, no bony abnormality or joint deformities observed Psychiatric: grossly normal mood and affect, speech fluent and appropriate, alert and oriented x3 Neurologic: CN II-XII grossly intact, moves all extremities in coordinated fashion, sensation intact  Labs on Admission: I have personally reviewed following labs and imaging studies  CBC, BMP  GFR: CrCl cannot be calculated (Unknown ideal weight.).   Creatinine Clearance: CrCl cannot be calculated (Unknown ideal weight.).    Radiological Exams on Admission: Ct  Angio Chest Pe W/cm &/or Wo Cm  Result Date: 12/21/2017 CLINICAL DATA:  Shortness of breath. Chest discomfort. Positive D-dimer. EXAM: CT ANGIOGRAPHY CHEST WITH CONTRAST TECHNIQUE: Multidetector CT imaging of the chest was performed using the standard protocol during bolus administration of intravenous contrast. Multiplanar CT image reconstructions and MIPs were obtained to evaluate the vascular anatomy. CONTRAST:  75mL ISOVUE-370 IOPAMIDOL (ISOVUE-370) INJECTION 76% COMPARISON:  Chest x-ray dated 12/21/2017 FINDINGS: Cardiovascular: No pulmonary emboli. Borderline cardiomegaly with left ventricular hypertrophy and coronary artery calcifications. Mediastinum/Nodes: No adenopathy. 8 mm rim calcified nodule in the right lobe of the thyroid gland. Lungs/Pleura: No infiltrates or effusions. Peribronchial thickening. Upper Abdomen: Severe aortic atherosclerosis with a large eccentric plaque in the abdominal aorta just below the level of the renal arteries creating abdominal aortic stenosis. Musculoskeletal: No acute abnormality. Osteophytes fuse the spine from T3 through L1. Review of the MIP images confirms the above findings. IMPRESSION: 1. No pulmonary emboli. 2. Prominent peribronchial thickening consistent with bronchitis. 3. Cardiomegaly with left ventricular hypertrophy. 4. Severe atherosclerosis of the abdominal aorta with abdominal aortic stenosis just below the renal arteries. 5. Coronary artery calcification. Electronically Signed   By: Francene Boyers M.D.   On: 12/21/2017 16:06   Dg Chest Port 1 View  Result Date: 12/21/2017 CLINICAL DATA:  Shortness of breath.  Cough. EXAM: PORTABLE CHEST 1 VIEW COMPARISON:  08/15/2016 FINDINGS: The heart size and mediastinal contours are within normal limits. Both lungs are clear. The visualized skeletal structures are unremarkable. IMPRESSION: No active disease. Electronically Signed   By: Signa Kell M.D.   On: 12/21/2017 14:00    EKG: Independently reviewed -  sinus tachycardia, flattening of the ST segment in the inferior leads Assessment/Plan Principal Problem:   Hypertensive emergency Active Problems:   OBSTRUCTIVE SLEEP APNEA   Essential hypertension   CAD (coronary artery disease)   Hypokalemia   COPD with acute bronchitis (HCC)    Hypertensive emergency in patient with known hypertensive disease - most likely d/t medication non-compliance His EKG changes and elevated troponin are likely associated with myocardial strain, but given his c/o residual chest discomfort, cardiology consult was initiated BP significantly improved, will start home oral medications  COPD with Bronchitis - patient is a former smoker; will start on oral Zithromax, Duoneb and expectorant  OSA - will place  on CPAP at night  Known nonobstructive CAD - continue telemetry, monitor troponin, check lipid profile and Hgb A1C  CHF - patient received IV Lasix in the ED Continue oral Lasix  Hypokalemia - will repeat a dose of K and recheck BMP in am  DVT prophylaxis:  Lovenox Code Status: Full Family Communication: none Disposition Plan: cards tele Consults called: Cardiology Admission status: observation   Vinetta Bergamo Pager: [phone] [facility]  If 7PM-7AM, please contact night-coverage [email or web address] Password [facility]  12/21/2017, 6:17 PM

## 2017-12-21 NOTE — Progress Notes (Signed)
Left lower extremity venous duplex has been completed. Negative for DVT. Results were given to Dr. Fredderick Phenix.  12/21/17 1:27 PM Olen Cordial RVT

## 2017-12-21 NOTE — ED Triage Notes (Addendum)
Pt reports ongoing cough and sob. Reports fever and green/yellow sputum. Has mild chest discomfort and reports possible swelling to his legs, has been out of his HTN meds for several days. Has hx of CHF.

## 2017-12-21 NOTE — ED Notes (Signed)
Patient transported to CT 

## 2017-12-21 NOTE — ED Notes (Signed)
Cardene stopped per MD Ophelia Charter

## 2017-12-21 NOTE — Consult Note (Signed)
Cardiology Consultation:   Patient ID: TRAE BOVENZI; [id number]; 1966/01/16   Admit date: 12/21/2017 Date of Consult: 12/21/2017  Primary Care Provider: Massie Maroon, FNP Primary Cardiologist: Olga Millers, MD    Patient Profile:   [name] is a 52 y.o. male with a hx of diffuse CAD (cath in 2017 with 90% OM lesion, occluded Ramus, 50% LAD stenosis, 70% Circumflex lesion, 80% distal LAD lesion, 65% proximal RCA), HTN, medication non-compliance, stage 3 CKD, chronic combined systolic and diastolic CHF (last LVEF=45-50% by echo in August 2018) who is being seen today for the evaluation of chest pain at the request of Dr. Ophelia Charter with the IM service.   He has been out of his medications for 4 days. He presented to the ED with c/o SOB, orthopnea and cough. His systolic BP was 223/88 on arrival. He had mild chest pain yesterday but no chest pain today. He was found to have unilateral leg edema and venous dopplers were negative. D-dimer mildly elevated. Chest CTA without evidence of PE.   History of Present Illness:   [name] is a 52 yo male with a history of diffuse CAD (cath in 2017 with 90% OM lesion, occluded Ramus, 50% LAD stenosis, 70% Circumflex lesion, 80% distal LAD lesion, 65% proximal RCA), HTN, medication non-compliance, stage 3 CKD, chronic combined systolic and diastolic CHF (last LVEF=45-50% by echo in August 2018) who is being seen today for the evaluation of chest pain at the request of Dr. Ophelia Charter with the IM service.   He has been out of his medications for 4 days. He presented to the ED with c/o SOB, orthopnea and cough. His systolic BP was 223/88 on arrival. He had mild chest pain yesterday but no chest pain today. He was found to have unilateral leg edema and venous dopplers were negative. D-dimer mildly elevated. Chest CTA without evidence of PE. Troponin is 0.18 today. His BP has been controlled upon arrival by the ED and IM teams with Cardene. His home meds have  been resumed.   He has no c/o SOB or chest pain at this time.   Past Medical History:  Diagnosis Date  . CAD (coronary artery disease) 02/18/2009   Small OM occlusion with collaterals, 60% RCA, 40% LAD. April 2015    . Cardiomyopathy, nonischemic (HCC) 05/03/2016  . CHF (congestive heart failure) (HCC)   . CKD (chronic kidney disease)   . History of syncope 08/2008   Secondary to hypertension  . Hypertension, essential, benign    a. renal art [place] (6/14): no RAS  . Hypokalemia 05/03/2016  . Noncompliance   . Nonischemic cardiomyopathy (HCC)    EF 45%  . Obesity   . Sleep apnea   . Tobacco abuse     Past Surgical History:  Procedure Laterality Date  . CARDIAC CATHETERIZATION N/A 12/22/2015   Procedure: Left Heart Cath and Coronary Angiography;  Surgeon: Lyn Records, MD;  Location: [facility] INVASIVE CV LAB;  Service: Cardiovascular;  Laterality: N/A;  . IR US GUIDE VASC ACCESS RIGHT  04/14/2017  . IR VENOGRAM ADRENAL BI  04/14/2017  . IR VENOGRAM RENAL BI  04/14/2017  . IR VENOUS SAMPLING  04/14/2017  . IR VENOUS SAMPLING  04/14/2017  . LEFT HEART CATHETERIZATION WITH CORONARY ANGIOGRAM N/A 02/21/2014   Procedure: LEFT HEART CATHETERIZATION WITH CORONARY ANGIOGRAM;  Surgeon: Kathleene Hazel, MD;  Location: [facility] CATH LAB;  Service: Cardiovascular;  Laterality: N/A;  . None  Continuous Infusions: PRN Meds:.acetaminophen **OR** acetaminophen, acetaminophen-codeine, nitroGLYCERIN, ondansetron **OR** ondansetron (ZOFRAN) IV  Inpatient Medications: Scheduled Meds: Scheduled Meds: . amLODipine  10 mg Oral Daily  . aspirin EC  81 mg Oral Daily  . [START ON 12/22/2017] atorvastatin  80 mg Oral q1800  . azithromycin  500 mg Oral Daily   Followed by  . [START ON 12/22/2017] azithromycin  250 mg Oral Daily  . [START ON 12/22/2017] carvedilol  6.25 mg Oral BID WC  . enoxaparin (LOVENOX) injection  40 mg Subcutaneous Q24H  . furosemide  80 mg Oral BID  . guaiFENesin  600 mg Oral BID  .  hydrALAZINE  100 mg Oral TID  . ipratropium-albuterol  3 mL Nebulization Q6H  . isosorbide mononitrate  60 mg Oral Daily  . lisinopril  40 mg Oral Daily  . potassium chloride SA  40 mEq Oral Once  . spironolactone  50 mg Oral Daily   Continuous Infusions: PRN Meds:.acetaminophen **OR** acetaminophen, acetaminophen-codeine, nitroGLYCERIN, ondansetron **OR** ondansetron (ZOFRAN) IV  Allergies:    Allergies  Allergen Reactions  . Penicillins Other (See Comments)    Makes his throat itch really bad, and feels like it is closing up.  Has patient had a PCN reaction causing immediate rash, facial/tongue/throat swelling, SOB or lightheadedness with hypotension: Yes Has patient had a PCN reaction causing severe rash involving mucus membranes or skin necrosis: No Has patient had a PCN reaction that required hospitalization: Yes Has patient had a PCN reaction occurring within the last 10 years: No If all of the above answers are "NO", then may proceed with Cephalospo  . Shrimp [Shellfish Allergy]     Throat swells    Social History:   Social History   Socioeconomic History  . Marital status: Single    Spouse name: Not on file  . Number of children: 5  . Years of education: Not on file  . Highest education level: Not on file  Social Needs  . Financial resource strain: Not on file  . Food insecurity - worry: Not on file  . Food insecurity - inability: Not on file  . Transportation needs - medical: Not on file  . Transportation needs - non-medical: Not on file  Occupational History  . Occupation: [employer]    Employer: [employer]  Tobacco Use  . Smoking status: Former Smoker    Packs/day: 0.10    Years: 32.00    Pack years: 3.20    Types: Cigarettes  . Smokeless tobacco: Never Used  . Tobacco comment: Started smoking at age 79 (11).  Cutting back  Substance and Sexual Activity  . Alcohol use: No    Alcohol/week: 0.0 oz  . Drug use: No  . Sexual activity: Not Currently    Other Topics Concern  . Not on file  Social History Narrative   Lives with girlfriend.    Family History:    Family History  Problem Relation Age of Onset  . Lupus Mother   . Heart disease Maternal Grandfather   . Coronary artery disease Neg Hx   . Adrenal disorder Neg Hx      ROS:  Please see the history of present illness.   All other ROS reviewed and negative.     Physical Exam/Data:   Vitals:   12/21/17 1520 12/21/17 1630 12/21/17 1700 12/21/17 1730  BP: (!) 223/88 (!) 171/103 (!) 157/94 (!) 152/82  Pulse: (!) 105 (!) 104 (!) 104 97  Resp: (!) 27 (!) 22  20 (!) 25  Temp:      TempSrc:      SpO2: 95% 97% 94% 98%    Intake/Output Summary (Last 24 hours) at 12/21/2017 1811 Last data filed at 12/21/2017 1738 Gross per 24 hour  Intake 350 ml  Output -  Net 350 ml   There were no vitals filed for this visit. There is no height or weight on file to calculate BMI.  General:  Obese male in NAD.  HEENT: normal Lymph: no adenopathy Neck: no JVD Endocrine:  No thryomegaly Vascular: No carotid bruits; FA pulses 2+ bilaterally without bruits  Cardiac:  normal S1, S2; RRR; no murmur  Lungs:  clear to auscultation bilaterally, no wheezing, rhonchi or rales  Abd: soft, nontender, no hepatomegaly  Ext: Trace bilateral LE edema Musculoskeletal:  No deformities, BUE and BLE strength normal and equal Skin: warm and dry  Neuro:  CNs 2-12 intact, no focal abnormalities noted Psych:  Normal affect   EKG:  The EKG was personally reviewed and demonstrates:  Sinus, LVH Telemetry:  Telemetry was personally reviewed and demonstrates:  sinus  Relevant CV Studies:   Laboratory Data:  Chemistry Recent Labs  Lab 12/21/17 1140  NA 139  K 2.9*  CL 97*  CO2 30  GLUCOSE 118*  BUN 20  CREATININE 1.89*  CALCIUM 8.6*  GFRNONAA 39*  GFRAA 45*  ANIONGAP 12    No results for input(s): PROT, ALBUMIN, AST, ALT, ALKPHOS, BILITOT in the last 168 hours. Hematology Recent Labs   Lab 12/21/17 1140  WBC 11.1*  RBC 4.79  HGB 15.0  HCT 43.8  MCV 91.4  MCH 31.3  MCHC 34.2  RDW 14.1  PLT 301   Cardiac Enzymes Recent Labs  Lab 12/21/17 1440  TROPONINI 0.18*    Recent Labs  Lab 12/21/17 1152  TROPIPOC 0.16*    BNP Recent Labs  Lab 12/21/17 1240  BNP 604.9*    DDimer  Recent Labs  Lab 12/21/17 1240  DDIMER 0.79*    Radiology/Studies:  Ct Angio Chest Pe W/cm &/or Wo Cm  Result Date: 12/21/2017 CLINICAL DATA:  Shortness of breath. Chest discomfort. Positive D-dimer. EXAM: CT ANGIOGRAPHY CHEST WITH CONTRAST TECHNIQUE: Multidetector CT imaging of the chest was performed using the standard protocol during bolus administration of intravenous contrast. Multiplanar CT image reconstructions and MIPs were obtained to evaluate the vascular anatomy. CONTRAST:  75mL ISOVUE-370 IOPAMIDOL (ISOVUE-370) INJECTION 76% COMPARISON:  Chest x-ray dated 12/21/2017 FINDINGS: Cardiovascular: No pulmonary emboli. Borderline cardiomegaly with left ventricular hypertrophy and coronary artery calcifications. Mediastinum/Nodes: No adenopathy. 8 mm rim calcified nodule in the right lobe of the thyroid gland. Lungs/Pleura: No infiltrates or effusions. Peribronchial thickening. Upper Abdomen: Severe aortic atherosclerosis with a large eccentric plaque in the abdominal aorta just below the level of the renal arteries creating abdominal aortic stenosis. Musculoskeletal: No acute abnormality. Osteophytes fuse the spine from T3 through L1. Review of the MIP images confirms the above findings. IMPRESSION: 1. No pulmonary emboli. 2. Prominent peribronchial thickening consistent with bronchitis. 3. Cardiomegaly with left ventricular hypertrophy. 4. Severe atherosclerosis of the abdominal aorta with abdominal aortic stenosis just below the renal arteries. 5. Coronary artery calcification. Electronically Signed   By: Francene Boyers M.D.   On: 12/21/2017 16:06   Dg Chest Port 1 View  Result Date:  12/21/2017 CLINICAL DATA:  Shortness of breath.  Cough. EXAM: PORTABLE CHEST 1 VIEW COMPARISON:  08/15/2016 FINDINGS: The heart size and mediastinal contours are within normal limits. Both  lungs are clear. The visualized skeletal structures are unremarkable. IMPRESSION: No active disease. Electronically Signed   By: Signa Kell M.D.   On: 12/21/2017 14:00    Assessment and Plan:   1. Elevated troponin: Likely due to demand ischemia in setting of hypertensive emergency. He has been out of his medications. Given the degree of underlying CAD, an elevated troponin is not surprising in the setting of hypertensive emergency. I do not think he needs anti-coagulation. I would continue to trend his troponin.   2. Hypertensive emergency: Per IM team.   3. CAD: He has moderately severe CAD but has had no angina at home. I suspect that his chest pain yesterday was due to his elevated BP. Cycle troponin.   We will follow with you.   Verne Carrow 12/21/2017 6:53 PM    For questions or updates, please contact [facility] Please consult [email or web address] for contact info under Cardiology/STEMI.   Signed, Verne Carrow, MD  12/21/2017 6:11 PM

## 2017-12-21 NOTE — ED Notes (Signed)
RN checked on patient; continues to deny chest pain; NSR. Tech almost finished.

## 2017-12-22 DIAGNOSIS — Z79899 Other long term (current) drug therapy: Secondary | ICD-10-CM | POA: Diagnosis not present

## 2017-12-22 DIAGNOSIS — N179 Acute kidney failure, unspecified: Secondary | ICD-10-CM | POA: Diagnosis present

## 2017-12-22 DIAGNOSIS — I248 Other forms of acute ischemic heart disease: Secondary | ICD-10-CM | POA: Diagnosis present

## 2017-12-22 DIAGNOSIS — I428 Other cardiomyopathies: Secondary | ICD-10-CM | POA: Diagnosis present

## 2017-12-22 DIAGNOSIS — Z91013 Allergy to seafood: Secondary | ICD-10-CM | POA: Diagnosis not present

## 2017-12-22 DIAGNOSIS — Z87891 Personal history of nicotine dependence: Secondary | ICD-10-CM | POA: Diagnosis not present

## 2017-12-22 DIAGNOSIS — R748 Abnormal levels of other serum enzymes: Secondary | ICD-10-CM | POA: Diagnosis not present

## 2017-12-22 DIAGNOSIS — Z23 Encounter for immunization: Secondary | ICD-10-CM | POA: Diagnosis not present

## 2017-12-22 DIAGNOSIS — R001 Bradycardia, unspecified: Secondary | ICD-10-CM | POA: Diagnosis not present

## 2017-12-22 DIAGNOSIS — Z8249 Family history of ischemic heart disease and other diseases of the circulatory system: Secondary | ICD-10-CM | POA: Diagnosis not present

## 2017-12-22 DIAGNOSIS — G4733 Obstructive sleep apnea (adult) (pediatric): Secondary | ICD-10-CM

## 2017-12-22 DIAGNOSIS — J440 Chronic obstructive pulmonary disease with acute lower respiratory infection: Secondary | ICD-10-CM | POA: Diagnosis present

## 2017-12-22 DIAGNOSIS — I5042 Chronic combined systolic (congestive) and diastolic (congestive) heart failure: Secondary | ICD-10-CM | POA: Diagnosis present

## 2017-12-22 DIAGNOSIS — J209 Acute bronchitis, unspecified: Secondary | ICD-10-CM | POA: Diagnosis present

## 2017-12-22 DIAGNOSIS — Z880 Allergy status to penicillin: Secondary | ICD-10-CM | POA: Diagnosis not present

## 2017-12-22 DIAGNOSIS — E785 Hyperlipidemia, unspecified: Secondary | ICD-10-CM | POA: Diagnosis present

## 2017-12-22 DIAGNOSIS — Z91128 Patient's intentional underdosing of medication regimen for other reason: Secondary | ICD-10-CM | POA: Diagnosis not present

## 2017-12-22 DIAGNOSIS — N183 Chronic kidney disease, stage 3 (moderate): Secondary | ICD-10-CM | POA: Diagnosis present

## 2017-12-22 DIAGNOSIS — I161 Hypertensive emergency: Secondary | ICD-10-CM | POA: Diagnosis present

## 2017-12-22 DIAGNOSIS — Z7982 Long term (current) use of aspirin: Secondary | ICD-10-CM | POA: Diagnosis not present

## 2017-12-22 DIAGNOSIS — I252 Old myocardial infarction: Secondary | ICD-10-CM | POA: Diagnosis not present

## 2017-12-22 DIAGNOSIS — E669 Obesity, unspecified: Secondary | ICD-10-CM | POA: Diagnosis present

## 2017-12-22 DIAGNOSIS — I10 Essential (primary) hypertension: Secondary | ICD-10-CM | POA: Diagnosis not present

## 2017-12-22 DIAGNOSIS — I25118 Atherosclerotic heart disease of native coronary artery with other forms of angina pectoris: Secondary | ICD-10-CM | POA: Diagnosis not present

## 2017-12-22 DIAGNOSIS — I130 Hypertensive heart and chronic kidney disease with heart failure and stage 1 through stage 4 chronic kidney disease, or unspecified chronic kidney disease: Secondary | ICD-10-CM | POA: Diagnosis present

## 2017-12-22 DIAGNOSIS — E876 Hypokalemia: Secondary | ICD-10-CM | POA: Diagnosis present

## 2017-12-22 DIAGNOSIS — I2510 Atherosclerotic heart disease of native coronary artery without angina pectoris: Secondary | ICD-10-CM | POA: Diagnosis present

## 2017-12-22 DIAGNOSIS — Z6835 Body mass index (BMI) 35.0-35.9, adult: Secondary | ICD-10-CM | POA: Diagnosis not present

## 2017-12-22 LAB — BASIC METABOLIC PANEL
ANION GAP: 14 (ref 5–15)
BUN: 18 mg/dL (ref 6–20)
CALCIUM: 8.7 mg/dL — AB (ref 8.9–10.3)
CO2: 27 mmol/L (ref 22–32)
Chloride: 99 mmol/L — ABNORMAL LOW (ref 101–111)
Creatinine, Ser: 1.94 mg/dL — ABNORMAL HIGH (ref 0.61–1.24)
GFR, EST AFRICAN AMERICAN: 44 mL/min — AB (ref >60)
GFR, EST NON AFRICAN AMERICAN: 38 mL/min — AB (ref >60)
GLUCOSE: 109 mg/dL — AB (ref 65–99)
POTASSIUM: 3 mmol/L — AB (ref 3.5–5.1)
Sodium: 140 mmol/L (ref 135–145)

## 2017-12-22 LAB — HIV ANTIBODY (ROUTINE TESTING W REFLEX): HIV Screen 4th Generation wRfx: NONREACTIVE

## 2017-12-22 LAB — TROPONIN I
TROPONIN I: 0.18 ng/mL — AB (ref <0.03)
Troponin I: 0.16 ng/mL (ref <0.03)

## 2017-12-22 MED ORDER — LABETALOL HCL 200 MG PO TABS
300.0000 mg | ORAL_TABLET | Freq: Two times a day (BID) | ORAL | Status: DC
  Administered 2017-12-22 – 2017-12-23 (×3): 300 mg via ORAL
  Filled 2017-12-22 (×3): qty 1

## 2017-12-22 MED ORDER — GUAIFENESIN-DM 100-10 MG/5ML PO SYRP
5.0000 mL | ORAL_SOLUTION | Freq: Four times a day (QID) | ORAL | Status: DC | PRN
  Administered 2017-12-22 – 2017-12-23 (×2): 5 mL via ORAL
  Filled 2017-12-22 (×2): qty 5

## 2017-12-22 MED ORDER — HEPARIN SODIUM (PORCINE) 5000 UNIT/ML IJ SOLN
5000.0000 [IU] | Freq: Three times a day (TID) | INTRAMUSCULAR | Status: DC
  Administered 2017-12-22 – 2017-12-24 (×5): 5000 [IU] via SUBCUTANEOUS
  Filled 2017-12-22 (×5): qty 1

## 2017-12-22 MED ORDER — MAGNESIUM SULFATE 2 GM/50ML IV SOLN
2.0000 g | Freq: Once | INTRAVENOUS | Status: AC
  Administered 2017-12-22: 2 g via INTRAVENOUS
  Filled 2017-12-22: qty 50

## 2017-12-22 MED ORDER — POTASSIUM CHLORIDE CRYS ER 20 MEQ PO TBCR
40.0000 meq | EXTENDED_RELEASE_TABLET | Freq: Once | ORAL | Status: AC
  Administered 2017-12-22: 40 meq via ORAL
  Filled 2017-12-22: qty 2

## 2017-12-22 NOTE — H&P (Signed)
Attestation signed by Jonah Blue, MD at 12/21/2017 8:26 PM  Patient was seen, examined,treatment plan was discussed with the Advance Practice Provider.  I have personally reviewed the clinical findings, labs, EKG, imaging studies and management of this patient in detail. I have also reviewed the orders written for this patient which were under my direction. I agree with the documentation, as recorded by the Advance Practice Provider.   [name] is a 52 y.o. male with a Past Medical History of OSA, HTN, CHF (EF 45-50% in 8/18), CAD, CKD who presents with hypertensive crisis after running out of BP medications several days ago.  Patient does endorse prior CP but adamantly denied this during my evaluation.  Exam generally unremarkable.  Hypertensive crisis -Patient presenting with prior chest pain and ongoing SOB concerning for hypertensive crisis -He did have evidence of end organ failure, with elevated troponin to 0.18 -The initial goal of therapy would generally be to decrease the MAP by no more than 25% in the first hour and then continue to decrease the BP additionally within the next 2-6 hours -The patient received Cardene x 1 in the ER with subsequent significant decrease in BP without apparent difficulty to below goal so Cardene discontinued -The underlying cause is likely noncompliance -Based on his BP control no longer in a dangerous range, will observe patient on telemetry rather than placing in ICU -Will cycle troponin and request cardiology consultation -Will resume home BP medications      Jonah Blue, MD       History and Physical    [name] TWS:[id number] DOB: [birth year] DOA: 12/21/2017  PCP: Massie Maroon, FNP  Patient coming from: home  Chief Complaint: SOB, mild chest discomfort and cough  HPI: [name] is a 52 y.o. male with medical history significant of CAD, HTN, CHF, CKD, OSA, obesity who presented to the ED with c/o SOB,  mild chest discomfort and cough. Patient reported that onset of cough was approximately 2 weeks ago and cough was productive, but the last few days it turned into the dry cough. PAtient reported that during the last few days he has not been taking his BP medications and few days ago started having SOB with chest pain.  ED Course: Upper arrival to the emergency department patient had a blood pressure of 241/151 mmHg, pulse 105. On PE patient was noticed to have left lower extremity swelling and underwent Doppler ultrasound which was negative for DVT. 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  Patient was placed on Cardene drip and blood pressure responded nicely reducing to 151/94 mmHg.  I Review of Systems: As per HPI, also patient c/o abdominal distension and bouts of diarrhea, claudication of the lower extremities, hesitancy on urination, otherwise all other systems reviewed and  are negative.  Ambulatory Status: able to ambulate independently  Past Medical History:  Diagnosis Date  . CAD (coronary artery disease) 02/18/2009   Small OM occlusion with collaterals, 60% RCA, 40% LAD. April 2015    . Cardiomyopathy, nonischemic (HCC) 05/03/2016  . CHF (congestive heart failure) (HCC)   . CKD (chronic kidney disease)   . History of syncope 08/2008   Secondary to hypertension  . Hypertension, essential, benign    a. renal art [place] (6/14): no RAS  . Hypokalemia 05/03/2016  .  Noncompliance   . Nonischemic cardiomyopathy (HCC)    EF 45%  . Obesity   . Sleep apnea   . Tobacco abuse     Past Surgical History:  Procedure Laterality Date  . CARDIAC CATHETERIZATION N/A 12/22/2015   Procedure: Left Heart Cath and Coronary Angiography;  Surgeon: Lyn Records, MD;  Location: [facility] INVASIVE CV LAB;  Service: Cardiovascular;  Laterality:  N/A;  . IR US GUIDE VASC ACCESS RIGHT  04/14/2017  . IR VENOGRAM ADRENAL BI  04/14/2017  . IR VENOGRAM RENAL BI  04/14/2017  . IR VENOUS SAMPLING  04/14/2017  . IR VENOUS SAMPLING  04/14/2017  . LEFT HEART CATHETERIZATION WITH CORONARY ANGIOGRAM N/A 02/21/2014   Procedure: LEFT HEART CATHETERIZATION WITH CORONARY ANGIOGRAM;  Surgeon: Kathleene Hazel, MD;  Location: [facility] CATH LAB;  Service: Cardiovascular;  Laterality: N/A;  . None      Social History   Socioeconomic History  . Marital status: Single    Spouse name: Not on file  . Number of children: 5  . Years of education: Not on file  . Highest education level: Not on file  Social Needs  . Financial resource strain: Not on file  . Food insecurity - worry: Not on file  . Food insecurity - inability: Not on file  . Transportation needs - medical: Not on file  . Transportation needs - non-medical: Not on file  Occupational History  . Occupation: [employer]    Employer: [employer]  Tobacco Use  . Smoking status: Former Smoker    Packs/day: 0.10    Years: 32.00    Pack years: 3.20    Types: Cigarettes  . Smokeless tobacco: Never Used  . Tobacco comment: Started smoking at age 51 (42).  Cutting back  Substance and Sexual Activity  . Alcohol use: No    Alcohol/week: 0.0 oz  . Drug use: No  . Sexual activity: Not Currently  Other Topics Concern  . Not on file  Social History Narrative   Lives with girlfriend.    Allergies  Allergen Reactions  . Penicillins Other (See Comments)    Makes his throat itch really bad, and feels like it is closing up.  Has patient had a PCN reaction causing immediate rash, facial/tongue/throat swelling, SOB or lightheadedness with hypotension: Yes Has patient had a PCN reaction causing severe rash involving mucus membranes or skin necrosis: No Has patient had a PCN reaction that required hospitalization: Yes Has patient had a PCN reaction occurring within the last 10 years: No If all of  the above answers are "NO", then may proceed with Cephalospo  . Shrimp [Shellfish Allergy]     Throat swells    Family History  Problem Relation Age of Onset  . Lupus Mother   . Heart disease Maternal Grandfather   . Coronary artery disease Neg Hx   . Adrenal disorder Neg Hx     Prior to Admission medications   Medication Sig Start Date End Date Taking? Authorizing Provider  acetaminophen-codeine (TYLENOL #3) 300-30 MG tablet Take 1 tablet by mouth every 6 (six) hours as needed for moderate pain. 09/21/17  Yes Massie Maroon, FNP  amLODipine (NORVASC) 10 MG tablet Take 1 tablet (10 mg total) by mouth daily. 09/21/17  Yes Massie Maroon, FNP  aspirin EC 81 MG tablet Take 1 tablet (81 mg total) by mouth daily. 12/23/15  Yes Gherghe, Daylene Katayama, MD  furosemide (LASIX) 80 MG tablet TAKE ONE TABLET BY MOUTH  TWICE DAILY 07/26/17  Yes Lewayne Bunting, MD  hydrALAZINE (APRESOLINE) 100 MG tablet Take 1 tablet (100 mg total) by mouth 3 (three) times daily. 01/27/17  Yes Lewayne Bunting, MD  isosorbide mononitrate (IMDUR) 60 MG 24 hr tablet Take 1 tablet (60 mg total) by mouth daily. 01/27/17  Yes Lewayne Bunting, MD  labetalol (NORMODYNE) 200 MG tablet Take 2.5 tablets (500 mg total) by mouth 2 (two) times daily. 03/29/17  Yes Lewayne Bunting, MD  lisinopril (PRINIVIL,ZESTRIL) 40 MG tablet Take 1 tablet (40 mg total) by mouth daily. 12/23/15  Yes Leatha Gilding, MD  Multiple Vitamin (MULTIVITAMIN WITH MINERALS) TABS tablet Take 1 tablet by mouth daily.   Yes [provider]  potassium chloride SA (K-DUR,KLOR-CON) 20 MEQ tablet Take 2 tablets (40 mEq total) by mouth daily. 09/23/17  Yes Romero Belling, MD  spironolactone (ALDACTONE) 50 MG tablet Take 1 tablet (50 mg total) by mouth daily. 09/23/17  Yes Romero Belling, MD  atorvastatin (LIPITOR) 80 MG tablet Take 1 tablet (80 mg total) by mouth daily at 6 PM. Patient not taking: Reported on 12/21/2017 12/23/15   Leatha Gilding, MD    nitroGLYCERIN (NITROSTAT) 0.4 MG SL tablet Place 1 tablet (0.4 mg total) under the tongue every 5 (five) minutes as needed. For chest pain 12/23/15   Leatha Gilding, MD    Physical Exam: Vitals:   12/21/17 2137 12/21/17 2352 12/22/17 0533 12/22/17 0741  BP: (!) 164/95  (!) 173/97 (!) 154/120  Pulse: 94  65 62  Resp: 18  18 18   Temp: 99.1 F (37.3 C)  98.2 F (36.8 C) 97.6 F (36.4 C)  TempSrc: Oral  Oral Oral  SpO2: 99% 98% 98% 99%  Weight: 101.2 kg (223 lb 3.2 oz)  100.3 kg (221 lb 3.2 oz)   Height: 5\' 7"  (1.702 m)        General: Appears calm and comfortable Eyes: PERRLA, EOMI, normal lids, iris ENT:  grossly normal hearing, lips & tongue, mucous membranes moist and intact Neck: no lymphoadenopathy, masses or thyromegaly Cardiovascular: RRR, no m/r/g. No JVD, carotid bruits. Mild assymetric Left sided LE edema.  Respiratory: bilateral wheezes, and rhonchi. Normal respiratory effort. No accessory muscle use observed Abdomen: distended, non-tender, unable to appreciate organomegaly or masses. BS present in all quadrants Skin: no rash, ulcers or induration seen on limited exam Musculoskeletal: grossly normal tone BUE/BLE, good ROM, no bony abnormality or joint deformities observed Psychiatric: grossly normal mood and affect, speech fluent and appropriate, alert and oriented x3 Neurologic: CN II-XII grossly intact, moves all extremities in coordinated fashion, sensation intact  Labs on Admission: I have personally reviewed following labs and imaging studies  CBC, BMP  GFR: Estimated Creatinine Clearance: 50.3 mL/min (A) (by C-G formula based on SCr of 1.94 mg/dL (H)).   Creatinine Clearance: Estimated Creatinine Clearance: 50.3 mL/min (A) (by C-G formula based on SCr of 1.94 mg/dL (H)).    Radiological Exams on Admission: Ct Angio Chest Pe W/cm &/or Wo Cm  Result Date: 12/21/2017 CLINICAL DATA:  Shortness of breath. Chest discomfort. Positive D-dimer. EXAM: CT  ANGIOGRAPHY CHEST WITH CONTRAST TECHNIQUE: Multidetector CT imaging of the chest was performed using the standard protocol during bolus administration of intravenous contrast. Multiplanar CT image reconstructions and MIPs were obtained to evaluate the vascular anatomy. CONTRAST:  75mL ISOVUE-370 IOPAMIDOL (ISOVUE-370) INJECTION 76% COMPARISON:  Chest x-ray dated 12/21/2017 FINDINGS: Cardiovascular: No pulmonary emboli. Borderline cardiomegaly with left ventricular hypertrophy and coronary  artery calcifications. Mediastinum/Nodes: No adenopathy. 8 mm rim calcified nodule in the right lobe of the thyroid gland. Lungs/Pleura: No infiltrates or effusions. Peribronchial thickening. Upper Abdomen: Severe aortic atherosclerosis with a large eccentric plaque in the abdominal aorta just below the level of the renal arteries creating abdominal aortic stenosis. Musculoskeletal: No acute abnormality. Osteophytes fuse the spine from T3 through L1. Review of the MIP images confirms the above findings. IMPRESSION: 1. No pulmonary emboli. 2. Prominent peribronchial thickening consistent with bronchitis. 3. Cardiomegaly with left ventricular hypertrophy. 4. Severe atherosclerosis of the abdominal aorta with abdominal aortic stenosis just below the renal arteries. 5. Coronary artery calcification. Electronically Signed   By: Francene Boyers M.D.   On: 12/21/2017 16:06   Dg Chest Port 1 View  Result Date: 12/21/2017 CLINICAL DATA:  Shortness of breath.  Cough. EXAM: PORTABLE CHEST 1 VIEW COMPARISON:  08/15/2016 FINDINGS: The heart size and mediastinal contours are within normal limits. Both lungs are clear. The visualized skeletal structures are unremarkable. IMPRESSION: No active disease. Electronically Signed   By: Signa Kell M.D.   On: 12/21/2017 14:00    EKG: Independently reviewed - sinus tachycardia, flattening of the ST segment in the inferior leads Assessment/Plan Principal Problem:   Hypertensive  emergency Active Problems:   OBSTRUCTIVE SLEEP APNEA   Essential hypertension   CAD (coronary artery disease)   Hypokalemia   COPD with acute bronchitis (HCC)    Hypertensive emergency in patient with known hypertensive disease - most likely d/t medication non-compliance His EKG changes and elevated troponin are likely associated with myocardial strain, but given his c/o residual chest discomfort, cardiology consult was initiated BP significantly improved, will start home oral medications  COPD with Bronchitis - patient is a former smoker; will start on oral Zithromax, Duoneb and expectorant  OSA - will place on CPAP at night  Known nonobstructive CAD - continue telemetry, monitor troponin, check lipid profile and Hgb A1C  CHF - patient received IV Lasix in the ED Continue oral Lasix  Hypokalemia - will repeat a dose of K and recheck BMP in am  DVT prophylaxis:  Lovenox Code Status: Full Family Communication: none Disposition Plan: cards tele Consults called: Cardiology Admission status: observation   Vinetta Bergamo Pager: [phone] [facility]  If 7PM-7AM, please contact night-coverage [email or web address] Password [email or web address]  12/22/2017, 9:03 AM

## 2017-12-22 NOTE — Plan of Care (Signed)
Patient received CHF material.  Reviewed with patient.  Validation of learned material ascertained via "teach-back" method.

## 2017-12-22 NOTE — Progress Notes (Signed)
Pt's heart rate has fluctuated between 49 to 106. Alert and oriented X4. Denies any chest pain but C/O headache. Pain med administered with minimal relief. MD paged, waiting for response. Will continue to monitor.

## 2017-12-22 NOTE — Progress Notes (Signed)
PROGRESS NOTE    [name]  ZOX:[id number] DOB: 11-27-1965 DOA: 12/21/2017 PCP: Massie Maroon, FNP   Outpatient Specialists:     Brief Narrative:  [name] is a 52 y.o. male with medical history significant of CAD, HTN, CHF, CKD, OSA, obesity who presented to the ED with c/o SOB, mild chest discomfort and cough. Patient reported that onset of cough was approximately 2 weeks ago and cough was productive, but the last few days it turned into the dry cough. PAtient reported that during the last few days he has not been taking his BP medications and few days ago started having SOB with chest pain.      Assessment & Plan:   Principal Problem:   Hypertensive emergency Active Problems:   OBSTRUCTIVE SLEEP APNEA   Essential hypertension   CAD (coronary artery disease)   Hypokalemia   COPD with acute bronchitis (HCC)   Hypertensive emergency in patient with known hypertensive disease - most likely d/t medication non-compliance -EKG changes and elevated troponin are likely associated with myocardial strain -BP significantly improved on home oral medications -cardiology consult appreciated  CKD stage III -got contrast in ER -recheck BMP in AM  COPD with Bronchitis -nebs  OSA  CPAP QHS  Known nonobstructive CAD  -LDL 184 but appears to have been done at 6PM -statin  Chronic combined sys/dia CHF  Continue oral Lasix  Hypokalemia  -replete and recheck in AM -Mg low end of normal, replace    DVT prophylaxis:  SQ Heparin  Code Status: Full Code   Family Communication:   Disposition Plan:     Consultants:     Procedures:     Antimicrobials:      Subjective: Asking to go home No chest pain/no SOB  Objective: Vitals:   12/21/17 2137 12/21/17 2352 12/22/17 0533 12/22/17 0741  BP: (!) 164/95  (!) 173/97 (!) 154/120  Pulse: 94  65 62  Resp: 18  18 18   Temp: 99.1 F (37.3 C)  98.2 F (36.8 C) 97.6 F (36.4 C)  TempSrc:  Oral  Oral Oral  SpO2: 99% 98% 98% 99%  Weight: 101.2 kg (223 lb 3.2 oz)  100.3 kg (221 lb 3.2 oz)   Height: 5\' 7"  (1.702 m)       Intake/Output Summary (Last 24 hours) at 12/22/2017 1058 Last data filed at 12/22/2017 1004 Gross per 24 hour  Intake 1470 ml  Output 1800 ml  Net -330 ml   Filed Weights   12/21/17 2137 12/22/17 0533  Weight: 101.2 kg (223 lb 3.2 oz) 100.3 kg (221 lb 3.2 oz)    Examination:  General exam: obese male, NAD Respiratory system: Clear to auscultation. Respiratory effort normal. Cardiovascular system: S1 & S2 heard, RRR. No JVD, murmurs, rubs, gallops or clicks. No pedal edema. Gastrointestinal system: Abdomen is nondistended, soft and nontender. No organomegaly or masses felt. Normal bowel sounds heard. Central nervous system: Alert and oriented. No focal neurological deficits. Extremities: Symmetric 5 x 5 power. Skin: No rashes, lesions or ulcers Psychiatry: Judgement and insight appear normal. Mood & affect appropriate.     Data Reviewed: I have personally reviewed following labs and imaging studies  CBC: Recent Labs  Lab 12/21/17 1140 12/21/17 1819  WBC 11.1* 9.9  HGB 15.0 14.7  HCT 43.8 44.6  MCV 91.4 91.2  PLT 301 287   Basic Metabolic Panel: Recent Labs  Lab 12/21/17 1140 12/21/17 1313 12/21/17 1819 12/22/17 0234  NA 139  --   --  140  K 2.9*  --   --  3.0*  CL 97*  --   --  99*  CO2 30  --   --  27  GLUCOSE 118*  --   --  109*  BUN 20  --   --  18  CREATININE 1.89*  --  1.85* 1.94*  CALCIUM 8.6*  --   --  8.7*  MG  --  1.8 1.8  --    GFR: Estimated Creatinine Clearance: 50.3 mL/min (A) (by C-G formula based on SCr of 1.94 mg/dL (H)). Liver Function Tests: No results for input(s): AST, ALT, ALKPHOS, BILITOT, PROT, ALBUMIN in the last 168 hours. No results for input(s): LIPASE, AMYLASE in the last 168 hours. No results for input(s): AMMONIA in the last 168 hours. Coagulation Profile: No results for input(s): INR, PROTIME  in the last 168 hours. Cardiac Enzymes: Recent Labs  Lab 12/21/17 1440 12/21/17 2100 12/22/17 0234 12/22/17 0823  TROPONINI 0.18* 0.21* 0.18* 0.16*   BNP (last 3 results) No results for input(s): PROBNP in the last 8760 hours. HbA1C: Recent Labs    12/21/17 1819  HGBA1C 6.3*   CBG: No results for input(s): GLUCAP in the last 168 hours. Lipid Profile: Recent Labs    12/21/17 1819  CHOL 277*  HDL 53  LDLCALC 184*  TRIG 202*  CHOLHDL 5.2   Thyroid Function Tests: No results for input(s): TSH, T4TOTAL, FREET4, T3FREE, THYROIDAB in the last 72 hours. Anemia Panel: No results for input(s): VITAMINB12, FOLATE, FERRITIN, TIBC, IRON, RETICCTPCT in the last 72 hours. Urine analysis:    Component Value Date/Time   COLORURINE YELLOW 12/18/2011 1205   APPEARANCEUR CLEAR 12/18/2011 1205   LABSPEC 1.025 09/21/2017 1030   PHURINE 6.5 09/21/2017 1030   GLUCOSEU NEGATIVE 09/21/2017 1030   HGBUR TRACE (A) 09/21/2017 1030   BILIRUBINUR NEGATIVE 09/21/2017 1030   KETONESUR NEGATIVE 09/21/2017 1030   PROTEINUR >=300 (A) 09/21/2017 1030   UROBILINOGEN 0.2 09/21/2017 1030   NITRITE NEGATIVE 09/21/2017 1030   LEUKOCYTESUR NEGATIVE 09/21/2017 1030     )No results found for this or any previous visit (from the past 240 hour(s)).    Anti-infectives (From admission, onward)   Start     Dose/Rate Route Frequency Ordered Stop   12/22/17 1000  azithromycin (ZITHROMAX) tablet 250 mg  Status:  Discontinued     250 mg Oral Daily 12/21/17 1814 12/21/17 2026   12/21/17 1815  azithromycin (ZITHROMAX) tablet 500 mg     500 mg Oral Daily 12/21/17 1814 12/21/17 1919       Radiology Studies: Ct Angio Chest Pe W/cm &/or Wo Cm  Result Date: 12/21/2017 CLINICAL DATA:  Shortness of breath. Chest discomfort. Positive D-dimer. EXAM: CT ANGIOGRAPHY CHEST WITH CONTRAST TECHNIQUE: Multidetector CT imaging of the chest was performed using the standard protocol during bolus administration of  intravenous contrast. Multiplanar CT image reconstructions and MIPs were obtained to evaluate the vascular anatomy. CONTRAST:  18mL ISOVUE-370 IOPAMIDOL (ISOVUE-370) INJECTION 76% COMPARISON:  Chest x-ray dated 12/21/2017 FINDINGS: Cardiovascular: No pulmonary emboli. Borderline cardiomegaly with left ventricular hypertrophy and coronary artery calcifications. Mediastinum/Nodes: No adenopathy. 8 mm rim calcified nodule in the right lobe of the thyroid gland. Lungs/Pleura: No infiltrates or effusions. Peribronchial thickening. Upper Abdomen: Severe aortic atherosclerosis with a large eccentric plaque in the abdominal aorta just below the level of the renal arteries creating abdominal aortic stenosis. Musculoskeletal: No acute abnormality. Osteophytes fuse the spine from T3 through L1. Review of the  MIP images confirms the above findings. IMPRESSION: 1. No pulmonary emboli. 2. Prominent peribronchial thickening consistent with bronchitis. 3. Cardiomegaly with left ventricular hypertrophy. 4. Severe atherosclerosis of the abdominal aorta with abdominal aortic stenosis just below the renal arteries. 5. Coronary artery calcification. Electronically Signed   By: Francene Boyers M.D.   On: 12/21/2017 16:06   Dg Chest Port 1 View  Result Date: 12/21/2017 CLINICAL DATA:  Shortness of breath.  Cough. EXAM: PORTABLE CHEST 1 VIEW COMPARISON:  08/15/2016 FINDINGS: The heart size and mediastinal contours are within normal limits. Both lungs are clear. The visualized skeletal structures are unremarkable. IMPRESSION: No active disease. Electronically Signed   By: Signa Kell M.D.   On: 12/21/2017 14:00        Scheduled Meds: . amLODipine  10 mg Oral Daily  . aspirin EC  81 mg Oral Daily  . atorvastatin  80 mg Oral q1800  . furosemide  80 mg Oral BID  . guaiFENesin  600 mg Oral BID  . hydrALAZINE  100 mg Oral Q8H  . isosorbide mononitrate  60 mg Oral Daily  . labetalol  300 mg Oral BID  . lisinopril  40 mg Oral  Daily  . spironolactone  50 mg Oral Daily   Continuous Infusions: . magnesium sulfate 1 - 4 g bolus IVPB 2 g (12/22/17 1004)     LOS: 0 days    Time spent: 35 min    Joseph Art, DO [facility] Pager [phone]  If 7PM-7AM, please contact night-coverage [email or web address] Password [facility] 12/22/2017, 10:58 AM

## 2017-12-22 NOTE — H&P (Deleted)
Attestation signed by Jonah Blue, MD at 12/21/2017 8:26 PM     Patient was seen, examined,treatment plan was discussed with the Advance Practice Provider.  I have personally reviewed the clinical findings, labs, EKG, imaging studies and management of this patient in detail. I have also reviewed the orders written for this patient which were under my direction. I agree with the documentation, as recorded by the Advance Practice Provider.      [name] is a 52 y.o. male with a Past Medical History of OSA, HTN, CHF (EF 45-50% in 8/18), CAD, CKD who presents with hypertensive crisis after running out of BP medications several days ago.  Patient does endorse prior CP but adamantly denied this during my evaluation.  Exam generally unremarkable.     Hypertensive crisis  -Patient presenting with prior chest pain and ongoing SOB concerning for hypertensive crisis  -He did have evidence of end organ failure, with elevated troponin to 0.18  -The initial goal of therapy would generally be to decrease the MAP by no more than 25% in the first hour and then continue to decrease the BP additionally within the next 2-6 hours  -The patient received Cardene x 1 in the ER with subsequent significant decrease in BP without apparent difficulty to below goal so Cardene discontinued  -The underlying cause is likely noncompliance  -Based on his BP control no longer in a dangerous range, will observe patient on telemetry rather than placing in ICU  -Will cycle troponin and request cardiology consultation  -Will resume home BP medications     Jonah Blue, MD    History and Physical     BANKS CHAIKIN ZOX:[id number] DOB: [birth year] DOA: 12/21/2017    PCP: Massie Maroon, FNP    Patient coming from: home   Chief Complaint: SOB, mild chest discomfort and cough   HPI: [name] is a 52 y.o. male with medical history significant of CAD, HTN, CHF, CKD, OSA, obesity who  presented to the ED with c/o SOB, mild chest discomfort and cough. Patient reported that onset of cough was approximately 2 weeks ago and cough was productive, but the last few days it turned into the dry cough. PAtient reported that during the last few days he has not been taking his BP medications and few days ago started having SOB with chest pain.   ED Course: Upper arrival to the emergency department patient had a blood pressure of 241/151 mmHg, pulse 105. On PE patient was noticed to have left lower extremity swelling and underwent Doppler ultrasound which was negative for DVT. 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   Patient was placed on Cardene drip and blood pressure responded nicely reducing to 151/94 mmHg.   Review of Systems: As per HPI, also patient c/o abdominal distension and bouts of diarrhea, claudication of the lower extremities, hesitancy on urination, otherwise all other systems reviewed and  are negative.     Ambulatory Status: able to ambulate independently     Past Medical History:    Diagnosis / Date   .CAD (coronary artery disease) 02/18/2009  Small OM occlusion with collaterals, 60% RCA, 40% LAD. April 2015     . Cardiomyopathy, nonischemic (HCC) 05/03/2016   . CHF (congestive heart failure) (HCC)   . CKD (chronic kidney disease)   .  History of syncope 08/2008 Secondary to hypertension   . Hypertension, essential, benign renal art [place] (6/14): no RAS   . Hypokalemia 05/03/2016   . Noncompliance    . Nonischemic cardiomyopathy (HCC) EF 45%   . Obesity   . Sleep apnea   . Tobacco abuse        Past Surgical History:    Procedure / Laterality / Date    . CARDIAC CATHETERIZATION N/A 12/22/2015   Procedure: Left Heart Cath and Coronary Angiography;  Surgeon: Lyn Records, MD;   Location: [facility] INVASIVE CV LAB;  Service: Cardiovascular;  Laterality: N/A;    . IR US GUIDE VASC ACCESS RIGHT 04/14/2017    . IR VENOGRAM ADRENAL BI 04/14/2017   . IR VENOGRAM RENAL BI 04/14/2017    . IR VENOUS SAMPLING 04/14/2017    . IR VENOUS SAMPLING 04/14/2017    . LEFT HEART CATHETERIZATION WITH CORONARY ANGIOGRAM N/A 02/21/2014   Procedure: LEFT HEART CATHETERIZATION WITH CORONARY ANGIOGRAM;  Surgeon: Kathleene Hazel, MD;  Location: [facility] CATH LAB;  Service: Cardiovascular;  Laterality: N/A;   . None     Social History /  Socioeconomic History    . Marital status: Single     Spouse name: Not on file    . Number of children: 5   . Years of education:   Not on file   . Highest education level: Not on file   Social Needs   . Financial resource strain: Not on file  . Food insecurity - worry: Not on file   . Food insecurity - inability: Not on file   . Transportation needs - medical: Not on file   . Transportation needs - non-medical: Not on file   Occupational History   . Occupation: [employer]      Employer: [employer]   Tobacco Use  . Smoking status: Former Smoker  Packs/day: 0.10  Years: 32.00  Pack years: 3.20  Types: Cigarettes  . Smokeless tobacco: Never Used  Tobacco comment: Started smoking at age 58 (28).  Cutting back   Substance and Sexual Activity  . Alcohol use: No  Alcohol/week: 0.0 oz  . Drug use: No  . Sexual activity: Not Currently   Other Topics  . Concern: Not on file   Social History Narrative: Lives with girlfriend.     Allergies / Allergen /Reactions   . Penicillins  Other (See Comments)  Makes his throat itch really bad, and feels like it is closing up.   Has patient had a PCN reaction causing immediate rash, facial/tongue/throat swelling, SOB or lightheadedness with hypotension: Yes  Has patient had a PCN reaction causing severe rash involving mucus membranes or skin necrosis: No  Has patient had a  PCN reaction that required hospitalization: Yes  Has patient had a PCN reaction occurring within the last 10 years: No  If all of the above answers are "NO", then may proceed with Cephalospo   . Shrimp [Shellfish Allergy]  Throat swells    Family History  Problem / Relation / Age of Onset    .Lupus - Mother   . Heart disease - Maternal Grandfather     . Coronary artery disease - Neg Hx   . Adrenal disorder - neg Hx     Prior to Admission medications   Medication / Sig / Start Date / End Date    - acetaminophen-codeine (TYLENOL #3) 300-30 MG tablet   Take 1 tablet by mouth every 6 (six)  hours as needed for moderate pain.   09/21/17   Massie Maroon, FNP    - amLODipine (NORVASC) 10 MG tablet   Take 1 tablet (10 mg total) by mouth daily.   09/21/17   Massie Maroon, FNP    - aspirin EC 81 MG tablet   Take 1 tablet (81 mg total) by mouth daily.   12/23/15   Leatha Gilding, MD    - furosemide (LASIX) 80 MG tablet   TAKE ONE TABLET BY MOUTH TWICE DAILY   07/26/17   Lewayne Bunting, MD    - hydrALAZINE (APRESOLINE) 100 MG tablet   Take 1 tablet (100 mg total) by mouth 3 (three) times daily.   01/27/17    Lewayne Bunting, MD    - isosorbide mononitrate (IMDUR) 60 MG 24 hr tablet   Take 1 tablet (60 mg total) by mouth daily.   01/27/17   Lewayne Bunting, MD    - labetalol (NORMODYNE) 200 MG tablet   Take 2.5 tablets (500 mg total) by mouth 2 (two) times daily.   03/29/17   Lewayne Bunting, MD    - lisinopril (PRINIVIL,ZESTRIL) 40 MG tablet   Take 1 tablet (40 mg total) by mouth daily.   12/23/15   Leatha Gilding, MD    - Multiple Vitamin (MULTIVITAMIN WITH MINERALS) TABS tablet   Take 1 tablet by mouth daily.   [provider]    - potassium chloride SA (K-DUR,KLOR-CON) 20 MEQ tablet   Take 2 tablets (40 mEq total) by mouth daily.   09/23/17   Romero Belling, MD    - spironolactone (ALDACTONE)  50 MG tablet   Take 1 tablet (50 mg total) by mouth daily.   09/23/17   Romero Belling, MD    - atorvastatin (LIPITOR) 80 MG tablet   Take 1 tablet (80 mg total) by mouth daily at 6 PM.  Patient not taking: Reported on 12/21/2017   12/23/15   Leatha Gilding, MD    - nitroGLYCERIN (NITROSTAT) 0.4 MG SL tablet   Place 1 tablet (0.4 mg total) under the tongue every 5 (five) minutes as needed. For chest pain   12/23/15   Leatha Gilding, MD     Physical Exam:     Vitals:    Temp: 98.8  12/21/17 1520 BP: (!) 223/88 Pulse: (!) 105 Resp: 27 SpO2: 95%   12/21/17 1630 BP: (!) 171/103 Pulse: (!) 104 Resp: (!) 22 SpO2: 97%  12/21/17 1700 BP: (!) 157/94 Pulse: (!) 104 Resp: 20   SpoO2 95%  12/21/17 1730 BP: (!) 152/82  Pulse: 97   Resp: (!) 25 98%    .General: Appears calm and comfortable   .Eyes: PERRLA, EOMI, normal lids, iris   .ENT:  grossly normal hearing, lips & tongue, mucous membranes moist and intact   .Neck: no lymphoadenopathy, masses or thyromegaly   .Cardiovascular: RRR, no m/r/g. No JVD, carotid bruits. Mild assymetric Left sided LE edema.    Marland KitchenRespiratory: bilateral wheezes, and rhonchi. Normal respiratory effort. No accessory muscle use observed   .Abdomen: distended, non-tender, unable to appreciate organomegaly or masses. BS present in all quadrants   .Skin: no rash, ulcers or induration seen on limited exam   .Musculoskeletal: grossly normal tone BUE/BLE, good ROM, no bony abnormality or joint deformities observed   .Psychiatric: grossly normal mood and affect, speech fluent and appropriate, alert and oriented x3   .Neurologic: CN II-XII grossly intact, moves  all extremities in coordinated fashion, sensation intact   Labs on Admission: I have personally reviewed following labs and imaging studies  CBC, BMP   GFR:  CrCl cannot be calculated (Unknown ideal weight.).  Creatinine Clearance:  CrCl cannot be calculated (Unknown  ideal weight.).  Radiological Exams on Admission:  Imaging Results (Last 48 hours)    EKG: Independently reviewed - sinus tachycardia, flattening of the ST segment in the inferior leads  Assessment/Plan  Principal Problem:    Hypertensive emergency  Active Problems:    OBSTRUCTIVE SLEEP APNEA    Essential hypertension    CAD (coronary artery disease)    Hypokalemia    COPD with acute bronchitis (HCC)    Hypertensive emergency in patient with known hypertensive disease - most likely d/t medication non-compliance  His EKG changes and elevated troponin are likely associated with myocardial strain, but given his c/o residual chest discomfort, cardiology consult was initiated  BP significantly improved, will start home oral medications  COPD with Bronchitis - patient is a former smoker; will start on oral Zithromax, Duoneb and expectorant   OSA - will place on CPAP at night  Known nonobstructive CAD - continue telemetry, monitor troponin, check lipid profile and Hgb A1C  CHF - patient received IV Lasix in the ED, continue oral Lasix  Hypokalemia - will repeat a dose of K and recheck BMP in am     DVT prophylaxis:  Lovenox  Code Status: Full  Family Communication: none  Disposition Plan: cards tele  Consults called: Cardiology  Admission status: observation     Vinetta Bergamo  Pager: [phone]  [facility]     If 7PM-7AM, please contact night-coverage  [email or web address]  Password [email or web address]    12/21/2017, 6:17 PM               Cosigned by: Jonah Blue, MD at 12/21/2017  8:26 PM        Revision History

## 2017-12-22 NOTE — Progress Notes (Signed)
Progress Note  Patient Name: [name] Date of Encounter: 12/22/2017  Primary Cardiologist: Olga Millers, MD   Subjective   Patient reports feeling much better than yesterday. CP has resolved. Still with mild headache. Denies blurry vision, palpitations, dizziness, or lightheadedness.   Inpatient Medications    Scheduled Meds: . amLODipine  10 mg Oral Daily  . aspirin EC  81 mg Oral Daily  . atorvastatin  80 mg Oral q1800  . carvedilol  6.25 mg Oral BID WC  . furosemide  80 mg Oral BID  . guaiFENesin  600 mg Oral BID  . hydrALAZINE  100 mg Oral Q8H  . isosorbide mononitrate  60 mg Oral Daily  . lisinopril  40 mg Oral Daily  . pneumococcal 23 valent vaccine  0.5 mL Intramuscular Tomorrow-1000  . spironolactone  50 mg Oral Daily   Continuous Infusions: . magnesium sulfate 1 - 4 g bolus IVPB     PRN Meds: acetaminophen **OR** acetaminophen, acetaminophen-codeine, hydrALAZINE, ipratropium-albuterol, labetalol, nitroGLYCERIN, ondansetron **OR** ondansetron (ZOFRAN) IV   Vital Signs    Vitals:   12/21/17 2137 12/21/17 2352 12/22/17 0533 12/22/17 0741  BP: (!) 164/95  (!) 173/97 (!) 154/120  Pulse: 94  65 62  Resp: 18  18 18   Temp: 99.1 F (37.3 C)  98.2 F (36.8 C) 97.6 F (36.4 C)  TempSrc: Oral  Oral Oral  SpO2: 99% 98% 98% 99%  Weight: 223 lb 3.2 oz (101.2 kg)  221 lb 3.2 oz (100.3 kg)   Height: 5\' 7"  (1.702 m)       Intake/Output Summary (Last 24 hours) at 12/22/2017 0944 Last data filed at 12/21/2017 1924 Gross per 24 hour  Intake 700 ml  Output 1800 ml  Net -1100 ml   Filed Weights   12/21/17 2137 12/22/17 0533  Weight: 223 lb 3.2 oz (101.2 kg) 221 lb 3.2 oz (100.3 kg)    Telemetry    Sinus with episodes of tachycardia/bradycardia - Personally Reviewed  ECG    Sinus tachycardia (rate 102), LVH, TWI in lateral leads seen on previous EKGs - Personally Reviewed  Physical Exam   GEN: Well developed, well nourished AAM. Laying in bed in no  acute distress.   Neck: No JVD, no carotid bruits Cardiac: RRR, no murmurs, rubs, or gallops.  Respiratory: Clear to auscultation bilaterally, no wheezes/ rales/ rhonchi GI: NABS, Soft, nontender, non-distended  MS: No edema; No deformity. Neuro:  Nonfocal, moving all extremities spontaneously Psych: Normal affect   Labs    Chemistry Recent Labs  Lab 12/21/17 1140 12/21/17 1819 12/22/17 0234  NA 139  --  140  K 2.9*  --  3.0*  CL 97*  --  99*  CO2 30  --  27  GLUCOSE 118*  --  109*  BUN 20  --  18  CREATININE 1.89* 1.85* 1.94*  CALCIUM 8.6*  --  8.7*  GFRNONAA 39* 40* 38*  GFRAA 45* 47* 44*  ANIONGAP 12  --  14     Hematology Recent Labs  Lab 12/21/17 1140 12/21/17 1819  WBC 11.1* 9.9  RBC 4.79 4.89  HGB 15.0 14.7  HCT 43.8 44.6  MCV 91.4 91.2  MCH 31.3 30.1  MCHC 34.2 33.0  RDW 14.1 14.1  PLT 301 287    Cardiac Enzymes Recent Labs  Lab 12/21/17 1440 12/21/17 2100 12/22/17 0234  TROPONINI 0.18* 0.21* 0.18*    Recent Labs  Lab 12/21/17 1152  TROPIPOC 0.16*  BNP Recent Labs  Lab 12/21/17 1240  BNP 604.9*     DDimer  Recent Labs  Lab 12/21/17 1240  DDIMER 0.79*     Radiology    Ct Angio Chest Pe W/cm &/or Wo Cm  Result Date: 12/21/2017 CLINICAL DATA:  Shortness of breath. Chest discomfort. Positive D-dimer. EXAM: CT ANGIOGRAPHY CHEST WITH CONTRAST TECHNIQUE: Multidetector CT imaging of the chest was performed using the standard protocol during bolus administration of intravenous contrast. Multiplanar CT image reconstructions and MIPs were obtained to evaluate the vascular anatomy. CONTRAST:  71mL ISOVUE-370 IOPAMIDOL (ISOVUE-370) INJECTION 76% COMPARISON:  Chest x-ray dated 12/21/2017 FINDINGS: Cardiovascular: No pulmonary emboli. Borderline cardiomegaly with left ventricular hypertrophy and coronary artery calcifications. Mediastinum/Nodes: No adenopathy. 8 mm rim calcified nodule in the right lobe of the thyroid gland. Lungs/Pleura: No  infiltrates or effusions. Peribronchial thickening. Upper Abdomen: Severe aortic atherosclerosis with a large eccentric plaque in the abdominal aorta just below the level of the renal arteries creating abdominal aortic stenosis. Musculoskeletal: No acute abnormality. Osteophytes fuse the spine from T3 through L1. Review of the MIP images confirms the above findings. IMPRESSION: 1. No pulmonary emboli. 2. Prominent peribronchial thickening consistent with bronchitis. 3. Cardiomegaly with left ventricular hypertrophy. 4. Severe atherosclerosis of the abdominal aorta with abdominal aortic stenosis just below the renal arteries. 5. Coronary artery calcification. Electronically Signed   By: Francene Boyers M.D.   On: 12/21/2017 16:06   Dg Chest Port 1 View  Result Date: 12/21/2017 CLINICAL DATA:  Shortness of breath.  Cough. EXAM: PORTABLE CHEST 1 VIEW COMPARISON:  08/15/2016 FINDINGS: The heart size and mediastinal contours are within normal limits. Both lungs are clear. The visualized skeletal structures are unremarkable. IMPRESSION: No active disease. Electronically Signed   By: Signa Kell M.D.   On: 12/21/2017 14:00    Cardiac Studies   Echocardiogram 06/2017: Study Conclusions  - Left ventricle: The cavity size was normal. There was severe   concentric hypertrophy. Systolic function was mildly reduced. The   estimated ejection fraction was in the range of 45% to 50%. Mild   diffuse hypokinesis with no identifiable regional variations.   There was an increased relative contribution of atrial   contraction to ventricular filling. Doppler parameters are   consistent with abnormal left ventricular relaxation (grade 1   diastolic dysfunction). - Aortic valve: Trileaflet; normal thickness, mildly calcified   leaflets. - Mitral valve: There was trivial regurgitation.  Patient Profile     52 y.o. male with a hx of diffuse CAD (cath in 2017 with 90% OM lesion, occluded Ramus, 50% LAD stenosis,  70% Circumflex lesion, 80% distal LAD lesion, 65% proximal RCA), HTN, medication non-compliance, stage 3 CKD, chronic combined systolic and diastolic CHF (last LVEF=45-50% by echo in August 2018) who is being seen by cardiology for the evaluation of chest pain    Assessment & Plan    1. Chest pain with elevated troponin: likely 2/2 hypertensive emergency and demand ischemia. Patient had been out of his medications for several days. He denied anginal complaints prior to this episode.  - Trop peak 0.21>0.18 - EKG without ischemic changes; sinus with LVH pattern - No need for further inpatient ischemic work-up  2. Hypertensive emergency: BP continues to improve this AM. Home medications restarted - Continue management per primary team  3. CAD: Has moderate to severe CAD which was medically managed. Without anginal complaints prior to hospitalization.  - No need for further inpatient ischemic work-up - Continue home medications  4. Chronic combined CHF: volume status appears stable.  - Continue home medications  For questions or updates, please contact [facility] Please consult [email or web address] for contact info under Cardiology/STEMI.      Signed, Beatriz Stallion, PA-C  12/22/2017, 9:44 AM   [phone] As above, patient seen and examined.  He denies dyspnea or chest pain.  Blood pressure improving with resumption of blood pressure medications.  His enzymes are but no clear trend and not consistent with acute coronary syndrome.  Minimally elevated no further ischemia evaluation.  I will discontinue carvedilol and instead treat with labetalol 300 mg twice daily.  Follow blood pressure and adjust regimen as needed.  He had transient junctional rhythm on telemetry while asleep.  Follow heart rate.  Would recheck renal function tomorrow morning.  He has baseline renal insufficiency and had a CTA on admission.  He is at risk for contrast nephropathy.  Possible discharge tomorrow if renal  function and blood pressure stable.  Patient instructed on importance of compliance. Olga Millers, MD

## 2017-12-23 ENCOUNTER — Inpatient Hospital Stay (HOSPITAL_COMMUNITY): Payer: Medicaid Other

## 2017-12-23 DIAGNOSIS — E876 Hypokalemia: Secondary | ICD-10-CM

## 2017-12-23 DIAGNOSIS — J440 Chronic obstructive pulmonary disease with acute lower respiratory infection: Secondary | ICD-10-CM

## 2017-12-23 DIAGNOSIS — J209 Acute bronchitis, unspecified: Secondary | ICD-10-CM

## 2017-12-23 LAB — URINALYSIS, ROUTINE W REFLEX MICROSCOPIC
Bacteria, UA: NONE SEEN
Bilirubin Urine: NEGATIVE
Glucose, UA: NEGATIVE mg/dL
Hgb urine dipstick: NEGATIVE
KETONES UR: NEGATIVE mg/dL
Leukocytes, UA: NEGATIVE
Nitrite: NEGATIVE
PH: 5 (ref 5.0–8.0)
PROTEIN: 100 mg/dL — AB
Specific Gravity, Urine: 1.017 (ref 1.005–1.030)

## 2017-12-23 LAB — BASIC METABOLIC PANEL
Anion gap: 12 (ref 5–15)
BUN: 30 mg/dL — AB (ref 6–20)
CHLORIDE: 101 mmol/L (ref 101–111)
CO2: 29 mmol/L (ref 22–32)
CREATININE: 2.66 mg/dL — AB (ref 0.61–1.24)
Calcium: 8.8 mg/dL — ABNORMAL LOW (ref 8.9–10.3)
GFR calc Af Amer: 30 mL/min — ABNORMAL LOW (ref >60)
GFR calc non Af Amer: 26 mL/min — ABNORMAL LOW (ref >60)
Glucose, Bld: 116 mg/dL — ABNORMAL HIGH (ref 65–99)
Potassium: 3.4 mmol/L — ABNORMAL LOW (ref 3.5–5.1)
SODIUM: 142 mmol/L (ref 135–145)

## 2017-12-23 LAB — NA AND K (SODIUM & POTASSIUM), RAND UR
POTASSIUM UR: 31 mmol/L
Sodium, Ur: 49 mmol/L

## 2017-12-23 LAB — GLUCOSE, CAPILLARY: GLUCOSE-CAPILLARY: 130 mg/dL — AB (ref 65–99)

## 2017-12-23 MED ORDER — HYDRALAZINE HCL 20 MG/ML IJ SOLN: 10.0000 mg | INTRAMUSCULAR | Status: DC | PRN

## 2017-12-23 MED ORDER — POTASSIUM CHLORIDE CRYS ER 20 MEQ PO TBCR
40.0000 meq | EXTENDED_RELEASE_TABLET | Freq: Once | ORAL | Status: AC
  Administered 2017-12-23: 40 meq via ORAL
  Filled 2017-12-23: qty 2

## 2017-12-23 MED ORDER — LABETALOL HCL 200 MG PO TABS
400.0000 mg | ORAL_TABLET | Freq: Two times a day (BID) | ORAL | Status: DC
  Administered 2017-12-23 – 2017-12-24 (×2): 400 mg via ORAL
  Filled 2017-12-23 (×2): qty 2

## 2017-12-23 NOTE — Progress Notes (Signed)
Progress Note  Patient Name: [name] Date of Encounter: 12/23/2017  Primary Cardiologist: Olga Millers, MD   Subjective   No chest pain or dyspnea  Inpatient Medications    Scheduled Meds: . amLODipine  10 mg Oral Daily  . aspirin EC  81 mg Oral Daily  . atorvastatin  80 mg Oral q1800  . guaiFENesin  600 mg Oral BID  . heparin injection (subcutaneous)  5,000 Units Subcutaneous Q8H  . hydrALAZINE  100 mg Oral Q8H  . isosorbide mononitrate  60 mg Oral Daily  . labetalol  300 mg Oral BID  . spironolactone  50 mg Oral Daily   Continuous Infusions:  PRN Meds: acetaminophen **OR** acetaminophen, acetaminophen-codeine, guaiFENesin-dextromethorphan, hydrALAZINE, ipratropium-albuterol, labetalol, nitroGLYCERIN, ondansetron **OR** ondansetron (ZOFRAN) IV   Vital Signs    Vitals:   12/23/17 0711 12/23/17 0748 12/23/17 0800 12/23/17 1018  BP:  (!) 168/114 (!) 168/114 (!) 178/96  Pulse:   72 79  Resp:      Temp:   (!) 97.5 F (36.4 C)   TempSrc:   Oral   SpO2:   99%   Weight: 224 lb 9.6 oz (101.9 kg)     Height:        Intake/Output Summary (Last 24 hours) at 12/23/2017 1025 Last data filed at 12/23/2017 0900 Gross per 24 hour  Intake 600 ml  Output 400 ml  Net 200 ml   Filed Weights   12/21/17 2137 12/22/17 0533 12/23/17 0711  Weight: 223 lb 3.2 oz (101.2 kg) 221 lb 3.2 oz (100.3 kg) 224 lb 9.6 oz (101.9 kg)    Telemetry    Sinus- Personally Reviewed   Physical Exam   GEN: Well developed, obese NAD Neck: supple Cardiac: RRR Respiratory: CTA GI: NT/ND, no masses MS: No edema Neuro:  grossly intact   Labs    Chemistry Recent Labs  Lab 12/21/17 1140 12/21/17 1819 12/22/17 0234 12/23/17 0522  NA 139  --  140 142  K 2.9*  --  3.0* 3.4*  CL 97*  --  99* 101  CO2 30  --  27 29  GLUCOSE 118*  --  109* 116*  BUN 20  --  18 30*  CREATININE 1.89* 1.85* 1.94* 2.66*  CALCIUM 8.6*  --  8.7* 8.8*  GFRNONAA 39* 40* 38* 26*  GFRAA 45* 47* 44* 30*    ANIONGAP 12  --  14 12     Hematology Recent Labs  Lab 12/21/17 1140 12/21/17 1819  WBC 11.1* 9.9  RBC 4.79 4.89  HGB 15.0 14.7  HCT 43.8 44.6  MCV 91.4 91.2  MCH 31.3 30.1  MCHC 34.2 33.0  RDW 14.1 14.1  PLT 301 287    Cardiac Enzymes Recent Labs  Lab 12/21/17 1440 12/21/17 2100 12/22/17 0234 12/22/17 0823  TROPONINI 0.18* 0.21* 0.18* 0.16*    Recent Labs  Lab 12/21/17 1152  TROPIPOC 0.16*     BNP Recent Labs  Lab 12/21/17 1240  BNP 604.9*     DDimer  Recent Labs  Lab 12/21/17 1240  DDIMER 0.79*     Radiology    Ct Angio Chest Pe W/cm &/or Wo Cm  Result Date: 12/21/2017 CLINICAL DATA:  Shortness of breath. Chest discomfort. Positive D-dimer. EXAM: CT ANGIOGRAPHY CHEST WITH CONTRAST TECHNIQUE: Multidetector CT imaging of the chest was performed using the standard protocol during bolus administration of intravenous contrast. Multiplanar CT image reconstructions and MIPs were obtained to evaluate the vascular anatomy. CONTRAST:  75mL ISOVUE-370 IOPAMIDOL (ISOVUE-370) INJECTION 76% COMPARISON:  Chest x-ray dated 12/21/2017 FINDINGS: Cardiovascular: No pulmonary emboli. Borderline cardiomegaly with left ventricular hypertrophy and coronary artery calcifications. Mediastinum/Nodes: No adenopathy. 8 mm rim calcified nodule in the right lobe of the thyroid gland. Lungs/Pleura: No infiltrates or effusions. Peribronchial thickening. Upper Abdomen: Severe aortic atherosclerosis with a large eccentric plaque in the abdominal aorta just below the level of the renal arteries creating abdominal aortic stenosis. Musculoskeletal: No acute abnormality. Osteophytes fuse the spine from T3 through L1. Review of the MIP images confirms the above findings. IMPRESSION: 1. No pulmonary emboli. 2. Prominent peribronchial thickening consistent with bronchitis. 3. Cardiomegaly with left ventricular hypertrophy. 4. Severe atherosclerosis of the abdominal aorta with abdominal aortic stenosis  just below the renal arteries. 5. Coronary artery calcification. Electronically Signed   By: Francene Boyers M.D.   On: 12/21/2017 16:06   US Renal  Result Date: 12/23/2017 CLINICAL DATA:  Acute kidney injury EXAM: RENAL / URINARY TRACT ULTRASOUND COMPLETE COMPARISON:  CT chest 12/21/2017, and previous FINDINGS: Right Kidney: Length: 11.4 cm. Echogenic parenchyma compared to the adjacent liver. No hydronephrosis or focal lesion. Left Kidney: Length: 11.9 cm. Echogenic parenchyma. No focal lesion or hydronephrosis. Bladder: Incompletely distended, unremarkable IMPRESSION: 1. Negative for hydronephrosis. 2. Echogenic renal parenchyma, a nonspecific indicator of medical renal disease. Electronically Signed   By: Corlis Leak M.D.   On: 12/23/2017 09:49   Dg Chest Port 1 View  Result Date: 12/21/2017 CLINICAL DATA:  Shortness of breath.  Cough. EXAM: PORTABLE CHEST 1 VIEW COMPARISON:  08/15/2016 FINDINGS: The heart size and mediastinal contours are within normal limits. Both lungs are clear. The visualized skeletal structures are unremarkable. IMPRESSION: No active disease. Electronically Signed   By: Signa Kell M.D.   On: 12/21/2017 14:00    Cardiac Studies   Echocardiogram 06/2017: Study Conclusions  - Left ventricle: The cavity size was normal. There was severe   concentric hypertrophy. Systolic function was mildly reduced. The   estimated ejection fraction was in the range of 45% to 50%. Mild   diffuse hypokinesis with no identifiable regional variations.   There was an increased relative contribution of atrial   contraction to ventricular filling. Doppler parameters are   consistent with abnormal left ventricular relaxation (grade 1   diastolic dysfunction). - Aortic valve: Trileaflet; normal thickness, mildly calcified   leaflets. - Mitral valve: There was trivial regurgitation.  Patient Profile     52 y.o. male with a hx of diffuse CAD (cath in 2017 with 90% OM lesion, occluded  Ramus, 50% LAD stenosis, 70% Circumflex lesion, 80% distal LAD lesion, 65% proximal RCA), HTN, medication non-compliance, stage 3 CKD, chronic combined systolic and diastolic CHF (last LVEF=45-50% by echo in August 2018) who is being seen by cardiology for the evaluation of chest pain and hypertension    Assessment & Plan    1. Chest pain with elevated troponin: Minimally increased but no clear trend.  Not consistent with acute coronary syndrome.  No plans for further ischemia evaluation.  Also note occurred in the setting of not taking his medications.    2. Hypertensive emergency: BP remains elevated but improved.  Discontinue lisinopril given acute on chronic kidney disease.  Increase labetalol to 400 mg twice daily.  Continue amlodipine, hydralazine, nitrates.  Advance regimen as needed.  3. CAD: Continue aspirin and statin.  4. Chronic combined CHF: We will hold Lasix and spironolactone today given worsening renal function.  5.  Acute on  chronic stage III kidney disease-this is likely related to the CTA that was performed in the emergency room.  He most likely has contrast nephropathy.  Hold ACE inhibitor and diuretic.  Follow renal function closely.  For questions or updates, please contact [facility] Please consult [email or web address] for contact info under Cardiology/STEMI.      Signed, Olga Millers, MD  12/23/2017, 10:25 AM

## 2017-12-23 NOTE — Progress Notes (Signed)
Pt refused to wear C-PAP.

## 2017-12-23 NOTE — Care Management Note (Signed)
Case Management Note  Patient Details  Name: [name] MRN: [id number] Date of Birth: [birth year]  Subjective/Objective:    HTN Crisis               Action/Plan: Patient lives at home with his spouse; [facility] and the [facility]; has private insurance with Medicaid with prescription drug coverage, he gets his medication from the [facility]; No DME at this time; CM talked to patient about "ran out of medication for a few day." Patient stated that he called his medication in to the [facility] there was a delay with them filling it. No need identified at this time. CM will continue to follow for progression of care.  Expected Discharge Date:     Possibly 12/26/2017             Expected Discharge Plan:  Home/Self Care  Discharge planning Services  CM Consult  Status of Service:  In process, will continue to follow  Reola Mosher [phone] 12/23/2017, 11:03 AM

## 2017-12-23 NOTE — Plan of Care (Signed)
  Activity: Risk for activity intolerance will decrease 12/23/2017 0206 - Progressing by Elnita Maxwell, RN   Nutrition: Adequate nutrition will be maintained 12/23/2017 0206 - Progressing by Elnita Maxwell, RN   Coping: Level of anxiety will decrease 12/23/2017 0206 - Progressing by Elnita Maxwell, RN   Pain Managment: General experience of comfort will improve 12/23/2017 0206 - Progressing by Elnita Maxwell, RN   Education: Knowledge of General Education information will improve 12/23/2017 0206 - Progressing by Elnita Maxwell, RN

## 2017-12-23 NOTE — Progress Notes (Signed)
Pt refuse CPAP for the night. Pt states that he does have one at home and he uses it sometimes occasionally. Instructed patient to call if he changes his mind and decides to wear it.

## 2017-12-23 NOTE — Progress Notes (Signed)
PROGRESS NOTE    [name]  GNF:[id number] DOB: April 28, 1966 DOA: 12/21/2017 PCP: Massie Maroon, FNP   Brief Narrative: 52 year old male with history of coronary artery disease, hypertension, CHF, chronic kidney disease stage III, OSA, obesity presented with shortness of breath and mild chest discomfort associated with cough.  Patient had CT angiogram in ER.  Assessment & Plan:   #Hypertensive emergency due to medical noncompliance: Patient was running out of prescription for his blood pressure medicine about 3 days prior to admission.  Continue labetalol, amlodipine, hydralazine, Imdur.  Monitor blood pressure.  Lasix, lisinopril and Aldactone on hold because of worsening renal failure.  Hydralazine as needed for the management of uncontrolled hypertension.  #Acute kidney injury on chronic kidney disease stage III likely contrast induced nephropathy: Bump in serum creatinine level today.  Check UA, ultrasound kidneys and urine electrolytes.  Holding Lasix, lisinopril and Aldactone.  Monitor BMP.  Avoid nephrotoxins  #COPD with chronic bronchitis: Continue nebulizer.  #OSA on CPAP at night  #History of coronary artery disease: Continue statin, aspirin and current cardiac medication.  Elevated troponin level likely due to hypertensive emergency, reduced GFR and chronic CHF.  Patient has no chest pain today.  #Chronic combined systolic and diastolic congestive heart failure: Looks euvolemic on exam.  Diuretics on hold because of renal failure.  Echo with EF 45-50% and grade 1 diastolic dysfunction.  #Hypokalemia: Replete potassium chloride.  Monitor labs.  DVT prophylaxis: Heparin subcutaneous Code Status: Full code Family Communication: No family at bedside Disposition Plan: Likely discharge home in 1-2 days    Consultants:   Cardiology  Procedures: None Antimicrobials: None  Subjective: Seen and examined at bedside.  Denies headache, dizziness, nausea, vomiting, chest  pain, shortness of breath.  No dysuria urgency or frequency.  Objective: Vitals:   12/23/17 0748 12/23/17 0800 12/23/17 1018 12/23/17 1200  BP: (!) 168/114 (!) 168/114 (!) 178/96 132/70  Pulse:  72 79 71  Resp:  19  (!) 21  Temp:  (!) 97.5 F (36.4 C)  98.4 F (36.9 C)  TempSrc:  Oral  Oral  SpO2:  99%  99%  Weight:      Height:        Intake/Output Summary (Last 24 hours) at 12/23/2017 1212 Last data filed at 12/23/2017 1200 Gross per 24 hour  Intake 840 ml  Output 400 ml  Net 440 ml   Filed Weights   12/21/17 2137 12/22/17 0533 12/23/17 0711  Weight: 101.2 kg (223 lb 3.2 oz) 100.3 kg (221 lb 3.2 oz) 101.9 kg (224 lb 9.6 oz)    Examination:  General exam: Appears calm and comfortable  Respiratory system: Clear to auscultation. Respiratory effort normal. No wheezing or crackle Cardiovascular system: S1 & S2 heard, RRR.  No pedal edema. Gastrointestinal system: Abdomen is nondistended, soft and nontender. Normal bowel sounds heard. Central nervous system: Alert and oriented. No focal neurological deficits. Extremities: Symmetric 5 x 5 power. Skin: No rashes, lesions or ulcers Psychiatry: Judgement and insight appear normal. Mood & affect appropriate.     Data Reviewed: I have personally reviewed following labs and imaging studies  CBC: Recent Labs  Lab 12/21/17 1140 12/21/17 1819  WBC 11.1* 9.9  HGB 15.0 14.7  HCT 43.8 44.6  MCV 91.4 91.2  PLT 301 287   Basic Metabolic Panel: Recent Labs  Lab 12/21/17 1140 12/21/17 1313 12/21/17 1819 12/22/17 0234 12/23/17 0522  NA 139  --   --  140 142  K 2.9*  --   --  3.0* 3.4*  CL 97*  --   --  99* 101  CO2 30  --   --  27 29  GLUCOSE 118*  --   --  109* 116*  BUN 20  --   --  18 30*  CREATININE 1.89*  --  1.85* 1.94* 2.66*  CALCIUM 8.6*  --   --  8.7* 8.8*  MG  --  1.8 1.8  --   --    GFR: Estimated Creatinine Clearance: 36.9 mL/min (A) (by C-G formula based on SCr of 2.66 mg/dL (H)). Liver Function  Tests: No results for input(s): AST, ALT, ALKPHOS, BILITOT, PROT, ALBUMIN in the last 168 hours. No results for input(s): LIPASE, AMYLASE in the last 168 hours. No results for input(s): AMMONIA in the last 168 hours. Coagulation Profile: No results for input(s): INR, PROTIME in the last 168 hours. Cardiac Enzymes: Recent Labs  Lab 12/21/17 1440 12/21/17 2100 12/22/17 0234 12/22/17 0823  TROPONINI 0.18* 0.21* 0.18* 0.16*   BNP (last 3 results) No results for input(s): PROBNP in the last 8760 hours. HbA1C: Recent Labs    12/21/17 1819  HGBA1C 6.3*   CBG: No results for input(s): GLUCAP in the last 168 hours. Lipid Profile: Recent Labs    12/21/17 1819  CHOL 277*  HDL 53  LDLCALC 184*  TRIG 202*  CHOLHDL 5.2   Thyroid Function Tests: No results for input(s): TSH, T4TOTAL, FREET4, T3FREE, THYROIDAB in the last 72 hours. Anemia Panel: No results for input(s): VITAMINB12, FOLATE, FERRITIN, TIBC, IRON, RETICCTPCT in the last 72 hours. Sepsis Labs: No results for input(s): PROCALCITON, LATICACIDVEN in the last 168 hours.  No results found for this or any previous visit (from the past 240 hour(s)).       Radiology Studies: Ct Angio Chest Pe W/cm &/or Wo Cm  Result Date: 12/21/2017 CLINICAL DATA:  Shortness of breath. Chest discomfort. Positive D-dimer. EXAM: CT ANGIOGRAPHY CHEST WITH CONTRAST TECHNIQUE: Multidetector CT imaging of the chest was performed using the standard protocol during bolus administration of intravenous contrast. Multiplanar CT image reconstructions and MIPs were obtained to evaluate the vascular anatomy. CONTRAST:  39mL ISOVUE-370 IOPAMIDOL (ISOVUE-370) INJECTION 76% COMPARISON:  Chest x-ray dated 12/21/2017 FINDINGS: Cardiovascular: No pulmonary emboli. Borderline cardiomegaly with left ventricular hypertrophy and coronary artery calcifications. Mediastinum/Nodes: No adenopathy. 8 mm rim calcified nodule in the right lobe of the thyroid gland.  Lungs/Pleura: No infiltrates or effusions. Peribronchial thickening. Upper Abdomen: Severe aortic atherosclerosis with a large eccentric plaque in the abdominal aorta just below the level of the renal arteries creating abdominal aortic stenosis. Musculoskeletal: No acute abnormality. Osteophytes fuse the spine from T3 through L1. Review of the MIP images confirms the above findings. IMPRESSION: 1. No pulmonary emboli. 2. Prominent peribronchial thickening consistent with bronchitis. 3. Cardiomegaly with left ventricular hypertrophy. 4. Severe atherosclerosis of the abdominal aorta with abdominal aortic stenosis just below the renal arteries. 5. Coronary artery calcification. Electronically Signed   By: Francene Boyers M.D.   On: 12/21/2017 16:06   US Renal  Result Date: 12/23/2017 CLINICAL DATA:  Acute kidney injury EXAM: RENAL / URINARY TRACT ULTRASOUND COMPLETE COMPARISON:  CT chest 12/21/2017, and previous FINDINGS: Right Kidney: Length: 11.4 cm. Echogenic parenchyma compared to the adjacent liver. No hydronephrosis or focal lesion. Left Kidney: Length: 11.9 cm. Echogenic parenchyma. No focal lesion or hydronephrosis. Bladder: Incompletely distended, unremarkable IMPRESSION: 1. Negative for hydronephrosis. 2. Echogenic renal parenchyma, a nonspecific indicator of medical renal disease. Electronically Signed  By: Corlis Leak M.D.   On: 12/23/2017 09:49   Dg Chest Port 1 View  Result Date: 12/21/2017 CLINICAL DATA:  Shortness of breath.  Cough. EXAM: PORTABLE CHEST 1 VIEW COMPARISON:  08/15/2016 FINDINGS: The heart size and mediastinal contours are within normal limits. Both lungs are clear. The visualized skeletal structures are unremarkable. IMPRESSION: No active disease. Electronically Signed   By: Signa Kell M.D.   On: 12/21/2017 14:00        Scheduled Meds: . amLODipine  10 mg Oral Daily  . aspirin EC  81 mg Oral Daily  . atorvastatin  80 mg Oral q1800  . guaiFENesin  600 mg Oral BID  .  heparin injection (subcutaneous)  5,000 Units Subcutaneous Q8H  . hydrALAZINE  100 mg Oral Q8H  . isosorbide mononitrate  60 mg Oral Daily  . labetalol  400 mg Oral BID   Continuous Infusions:   LOS: 1 day    Dron Jaynie Collins, MD [facility] Pager [phone]  If 7PM-7AM, please contact night-coverage [email or web address] Password [email or web address] 12/23/2017, 12:12 PM

## 2017-12-24 DIAGNOSIS — R001 Bradycardia, unspecified: Secondary | ICD-10-CM

## 2017-12-24 DIAGNOSIS — I10 Essential (primary) hypertension: Secondary | ICD-10-CM

## 2017-12-24 DIAGNOSIS — I25118 Atherosclerotic heart disease of native coronary artery with other forms of angina pectoris: Secondary | ICD-10-CM

## 2017-12-24 DIAGNOSIS — N183 Chronic kidney disease, stage 3 (moderate): Secondary | ICD-10-CM

## 2017-12-24 LAB — GLUCOSE, CAPILLARY: GLUCOSE-CAPILLARY: 124 mg/dL — AB (ref 65–99)

## 2017-12-24 LAB — RENAL FUNCTION PANEL
ALBUMIN: 2.8 g/dL — AB (ref 3.5–5.0)
Anion gap: 12 (ref 5–15)
BUN: 30 mg/dL — ABNORMAL HIGH (ref 6–20)
CALCIUM: 8.7 mg/dL — AB (ref 8.9–10.3)
CHLORIDE: 103 mmol/L (ref 101–111)
CO2: 25 mmol/L (ref 22–32)
CREATININE: 2.38 mg/dL — AB (ref 0.61–1.24)
GFR calc Af Amer: 34 mL/min — ABNORMAL LOW (ref >60)
GFR calc non Af Amer: 30 mL/min — ABNORMAL LOW (ref >60)
Glucose, Bld: 106 mg/dL — ABNORMAL HIGH (ref 65–99)
POTASSIUM: 3.3 mmol/L — AB (ref 3.5–5.1)
Phosphorus: 4.7 mg/dL — ABNORMAL HIGH (ref 2.5–4.6)
Sodium: 140 mmol/L (ref 135–145)

## 2017-12-24 MED ORDER — POTASSIUM CHLORIDE CRYS ER 20 MEQ PO TBCR
40.0000 meq | EXTENDED_RELEASE_TABLET | Freq: Once | ORAL | Status: AC
Start: 2017-12-24 — End: 2017-12-24
  Administered 2017-12-24: 40 meq via ORAL
  Filled 2017-12-24: qty 2

## 2017-12-24 MED ORDER — ISOSORBIDE MONONITRATE ER 60 MG PO TB24: 60.0000 mg | ORAL_TABLET | Freq: Every day | ORAL | 0 refills | Status: DC

## 2017-12-24 MED ORDER — LABETALOL HCL 200 MG PO TABS: 400.0000 mg | ORAL_TABLET | Freq: Two times a day (BID) | ORAL | 0 refills | Status: AC

## 2017-12-24 MED ORDER — HYDRALAZINE HCL 100 MG PO TABS: 100.0000 mg | ORAL_TABLET | Freq: Three times a day (TID) | ORAL | 0 refills | Status: DC

## 2017-12-24 MED ORDER — POTASSIUM CHLORIDE CRYS ER 20 MEQ PO TBCR: 40.0000 meq | EXTENDED_RELEASE_TABLET | Freq: Every day | ORAL | 0 refills | Status: DC

## 2017-12-24 MED ORDER — NITROGLYCERIN 0.4 MG SL SUBL: 0.4000 mg | SUBLINGUAL_TABLET | SUBLINGUAL | 0 refills | Status: AC | PRN

## 2017-12-24 MED ORDER — DOXAZOSIN MESYLATE 2 MG PO TABS
2.0000 mg | ORAL_TABLET | Freq: Every day | ORAL | Status: DC
  Administered 2017-12-24: 2 mg via ORAL
  Filled 2017-12-24: qty 1

## 2017-12-24 MED ORDER — FUROSEMIDE 40 MG PO TABS: 40.0000 mg | ORAL_TABLET | Freq: Every day | ORAL | 0 refills | Status: DC

## 2017-12-24 MED ORDER — SPIRONOLACTONE 50 MG PO TABS: 50.0000 mg | ORAL_TABLET | Freq: Every day | ORAL | 0 refills | Status: DC

## 2017-12-24 MED ORDER — ATORVASTATIN CALCIUM 80 MG PO TABS: 80.0000 mg | ORAL_TABLET | Freq: Every day | ORAL | 0 refills | Status: AC

## 2017-12-24 MED ORDER — AMLODIPINE BESYLATE 10 MG PO TABS: 10.0000 mg | ORAL_TABLET | Freq: Every day | ORAL | 0 refills | Status: DC

## 2017-12-24 NOTE — Progress Notes (Signed)
Patient heart dropped to 30's and 40's. Patient has a diagnosis of sleep apnea but is refusing CPAP. RRT talked to him and continues to refused.

## 2017-12-24 NOTE — Progress Notes (Addendum)
Progress Note  Patient Name: [name] Date of Encounter: 12/24/2017  Primary Cardiologist: Olga Millers, MD   Subjective   Feeling well.  Wants to go home.   Inpatient Medications    Scheduled Meds: . amLODipine  10 mg Oral Daily  . aspirin EC  81 mg Oral Daily  . atorvastatin  80 mg Oral q1800  . guaiFENesin  600 mg Oral BID  . heparin injection (subcutaneous)  5,000 Units Subcutaneous Q8H  . hydrALAZINE  100 mg Oral Q8H  . isosorbide mononitrate  60 mg Oral Daily  . labetalol  400 mg Oral BID  . potassium chloride  40 mEq Oral Once   Continuous Infusions:  PRN Meds: acetaminophen **OR** acetaminophen, acetaminophen-codeine, guaiFENesin-dextromethorphan, hydrALAZINE, ipratropium-albuterol, labetalol, nitroGLYCERIN, ondansetron **OR** ondansetron (ZOFRAN) IV   Vital Signs    Vitals:   12/23/17 1500 12/23/17 2048 12/24/17 0535 12/24/17 0700  BP: 131/72 (!) 155/91 (!) 169/95   Pulse: 83 81 68   Resp: 19 18    Temp: 98.2 F (36.8 C) 98 F (36.7 C) 97.8 F (36.6 C)   TempSrc: Oral Oral Oral   SpO2: 97% 98% 97%   Weight:    226 lb 1.6 oz (102.6 kg)  Height:        Intake/Output Summary (Last 24 hours) at 12/24/2017 0845 Last data filed at 12/24/2017 0347 Gross per 24 hour  Intake 702 ml  Output 850 ml  Net -148 ml   Filed Weights   12/22/17 0533 12/23/17 0711 12/24/17 0700  Weight: 221 lb 3.2 oz (100.3 kg) 224 lb 9.6 oz (101.9 kg) 226 lb 1.6 oz (102.6 kg)    Telemetry    Sinus rhythm, sinus bradycardia, sinus tachycardia.  Heart rate dropped into the 30s and 40s overnight.- Personally Reviewed  ECG    N/A- Personally Reviewed  Physical Exam   VS:  BP (!) 169/95 (BP Location: Left Arm)   Pulse 68   Temp 97.8 F (36.6 C) (Oral)   Resp 18   Ht 5\' 7"  (1.702 m)   Wt 226 lb 1.6 oz (102.6 kg)   SpO2 97%   BMI 35.41 kg/m  , BMI Body mass index is 35.41 kg/m. GENERAL:  Well appearing HEENT: Pupils equal round and reactive, fundi not visualized,  oral mucosa unremarkable NECK:  No jugular venous distention, waveform within normal limits, carotid upstroke brisk and symmetric, no bruits, no thyromegaly LYMPHATICS:  No cervical adenopathy LUNGS:  Clear to auscultation bilaterally HEART:  RRR.  PMI not displaced or sustained,S1 and S2 within normal limits, no S3, no S4, no clicks, no rubs, no blood pressure remains above goal.  We will add doxazosin 2 mg daily.  Murmurs ABD:  Flat, positive bowel sounds normal in frequency in pitch, no bruits, no rebound, no guarding, no midline pulsatile mass, no hepatomegaly, no splenomegaly EXT:  2 plus pulses throughout, no edema, no cyanosis no clubbing SKIN:  No rashes no nodules NEURO:  Cranial nerves II through XII grossly intact, motor grossly intact throughout [facility]:  Cognitively intact, oriented to person place and time   Labs    Chemistry Recent Labs  Lab 12/22/17 0234 12/23/17 0522 12/24/17 0437  NA 140 142 140  K 3.0* 3.4* 3.3*  CL 99* 101 103  CO2 27 29 25   GLUCOSE 109* 116* 106*  BUN 18 30* 30*  CREATININE 1.94* 2.66* 2.38*  CALCIUM 8.7* 8.8* 8.7*  ALBUMIN  --   --  2.8*  GFRNONAA  38* 26* 30*  GFRAA 44* 30* 34*  ANIONGAP 14 12 12      Hematology Recent Labs  Lab 12/21/17 1140 12/21/17 1819  WBC 11.1* 9.9  RBC 4.79 4.89  HGB 15.0 14.7  HCT 43.8 44.6  MCV 91.4 91.2  MCH 31.3 30.1  MCHC 34.2 33.0  RDW 14.1 14.1  PLT 301 287    Cardiac Enzymes Recent Labs  Lab 12/21/17 1440 12/21/17 2100 12/22/17 0234 12/22/17 0823  TROPONINI 0.18* 0.21* 0.18* 0.16*    Recent Labs  Lab 12/21/17 1152  TROPIPOC 0.16*     BNP Recent Labs  Lab 12/21/17 1240  BNP 604.9*     DDimer  Recent Labs  Lab 12/21/17 1240  DDIMER 0.79*     Radiology    US Renal  Result Date: 12/23/2017 CLINICAL DATA:  Acute kidney injury EXAM: RENAL / URINARY TRACT ULTRASOUND COMPLETE COMPARISON:  CT chest 12/21/2017, and previous FINDINGS: Right Kidney: Length: 11.4 cm. Echogenic  parenchyma compared to the adjacent liver. No hydronephrosis or focal lesion. Left Kidney: Length: 11.9 cm. Echogenic parenchyma. No focal lesion or hydronephrosis. Bladder: Incompletely distended, unremarkable IMPRESSION: 1. Negative for hydronephrosis. 2. Echogenic renal parenchyma, a nonspecific indicator of medical renal disease. Electronically Signed   By: Corlis Leak M.D.   On: 12/23/2017 09:49    Cardiac Studies   Echocardiogram 06/2017: Study Conclusions  - Left ventricle: The cavity size was normal. There was severe concentric hypertrophy. Systolic function was mildly reduced. The estimated ejection fraction was in the range of 45% to 50%. Mild diffuse hypokinesis with no identifiable regional variations. There was an increased relative contribution of atrial contraction to ventricular filling. Doppler parameters are consistent with abnormal left ventricular relaxation (grade 1 diastolic dysfunction). - Aortic valve: Trileaflet; normal thickness, mildly calcified leaflets. - Mitral valve: There was trivial regurgitation.    Patient Profile     52 y.o. male with diffuse CAD (cath in 2017 with 90% OM lesion, occluded Ramus, 50% LAD stenosis, 70% Circumflex lesion, 80% distal LAD lesion, 65% proximal RCA), hypertensive heart disease, medication noncompliance,CKD III, chronic systolic and diastolic heart failure here with chest pain and hypertensive emergency.    Assessment & Plan    # Hypertensive emergency:  # Obstructive sleep apnea: Blood pressure remains above goal.  Add doxazosin 2 mg daily.  Continue amlodipine, hydralazine, and labetalol.  His blood pressure is likely more difficult to manage due to his untreated sleep apnea.  He is willing to consider trying nasal pillows.  # Chest pain: # Demand ischemia: Cardiac enzymes were elevated in a pattern more consistent with demand ischemia than true ischemia.  This occurred in the setting of hypertensive  emergency.  No further ischemia evaluation at this time, especially in the setting of noncompliance.  Continue aspirin, labetalol and atorvastatin.  # Chronic systolic and diastolic heart failure.  Diuretics were held yesterday due to acute on chronic renal failure.  His renal function is better but not at baseline.  Would continue to hold diuretics.  Blood pressure management as above.  # Sinus bradycardia: This occurs overnight and is attributable to sleep apnea.  We discussed the importance of wearing a CPAP machine.  He will consider nasal pillows.  For questions or updates, please contact [facility] Please consult [email or web address] for contact info under Cardiology/STEMI.      Signed, Chilton Si, MD  12/24/2017, 8:45 AM

## 2017-12-24 NOTE — Discharge Summary (Addendum)
Physician Discharge Summary  [name] FVC:[id number] DOB: 29-Jul-1966 DOA: 12/21/2017  PCP: Massie Maroon, FNP  Admit date: 12/21/2017 Discharge date: 12/24/2017  Admitted From:home Disposition:home  Recommendations for Outpatient Follow-up:  1. Follow up with PCP in 1-2 weeks 2. Please obtain BMP/CBC in one week  Home Health:no Equipment/Devices:none Discharge Condition:stable CODE STATUS:full code Diet recommendation:heart healthy  Brief/Interim Summary: 52 year old male with history of coronary artery disease, hypertension, CHF, chronic kidney disease stage III, OSA, obesity presented with shortness of breath and mild chest discomfort associated with cough.  Patient had CT angiogram in ER.  #Hypertensive emergency due to medical noncompliance: Patient was running out of prescription for his blood pressure medicine about 3 days prior to admission.  Blood pressure mildly elevated because Lasix, lisinopril and Aldactone were on hold.  I will reconcile patient's home medication. Continue other cardiac medications including hydralazine, Imdur L, labetalol.  Also on amlodipine. -I think patient will benefit from diuretics therefore reduce the dose of Lasix and resume Aldactone.  I asked patient to monitor blood pressure, heart rate, daily weight and check his kidney function within a week.  He verbalized understanding.  Patient reported that his pharmacy is closed till Monday therefore asking for paper prescription for most of his medication.  Prescription provided.  He is clinically stable.  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  Patient will check his kidney function within a week with his PCP.  Recommended to follow-up with a nephrologist for his chronic kidney disease and uncontrolled hypertension.  #COPD with chronic bronchitis: Continue nebulizer.  #OSA , outpatient follow-up.  #History of  coronary artery disease: Continue statin, aspirin and current cardiac medication.  Elevated troponin level likely due to hypertensive emergency, reduced GFR and chronic CHF.  Patient has no chest pain today.  #Chronic combined systolic and diastolic congestive heart failure: Looks euvolemic on exam.    Resume low-dose of diuretics on discharge. Echo with EF 45-50% and grade 1 diastolic dysfunction.  #Hypokalemia: Continue potassium chloride.  Dyslipidemia on statin,  Stable on discharge.  Discussed discharge medication with the patient.  He verbalized understanding of follow-up instructions.  Discharge Diagnoses:  Principal Problem:   Hypertensive emergency Active Problems:   OBSTRUCTIVE SLEEP APNEA   Essential hypertension   CAD (coronary artery disease)   Hypokalemia   COPD with acute bronchitis [facility])    Discharge Instructions  Discharge Instructions    (HEART FAILURE PATIENTS) Call MD:  Anytime you have any of the following symptoms: 1) 3 pound weight gain in 24 hours or 5 pounds in 1 week 2) shortness of breath, with or without a dry hacking cough 3) swelling in the hands, feet or stomach 4) if you have to sleep on extra pillows at night in order to breathe.   Complete by:  As directed    Call MD for:  difficulty breathing, headache or visual disturbances   Complete by:  As directed    Call MD for:  extreme fatigue   Complete by:  As directed    Call MD for:  hives   Complete by:  As directed    Call MD for:  persistant dizziness or light-headedness   Complete by:  As directed    Call MD for:  persistant nausea and vomiting   Complete by:  As directed    Call MD for:  severe uncontrolled pain   Complete by:  As directed  Call MD for:  temperature >100.4   Complete by:  As directed    Diet - low sodium heart healthy   Complete by:  As directed    Discharge instructions   Complete by:  As directed    Monitor blood pressure and heart rate.  Please follow-up with your  PCP and cardiologist.  Check daily weight.  Please check BMP to monitor kidney function within a week.   Increase activity slowly   Complete by:  As directed      Allergies as of 12/24/2017      Reactions   Penicillins Other (See Comments)   Makes his throat itch really bad, and feels like it is closing up.  Has patient had a PCN reaction causing immediate rash, facial/tongue/throat swelling, SOB or lightheadedness with hypotension: Yes Has patient had a PCN reaction causing severe rash involving mucus membranes or skin necrosis: No Has patient had a PCN reaction that required hospitalization: Yes Has patient had a PCN reaction occurring within the last 10 years: No If all of the above answers are "NO", then may proceed with Cephalospo   Shrimp [shellfish Allergy]    Throat swells      Medication List    STOP taking these medications   lisinopril 40 MG tablet Commonly known as:  PRINIVIL,ZESTRIL     TAKE these medications   acetaminophen-codeine 300-30 MG tablet Commonly known as:  TYLENOL #3 Take 1 tablet by mouth every 6 (six) hours as needed for moderate pain.   amLODipine 10 MG tablet Commonly known as:  NORVASC Take 1 tablet (10 mg total) by mouth daily.   aspirin EC 81 MG tablet Take 1 tablet (81 mg total) by mouth daily.   atorvastatin 80 MG tablet Commonly known as:  LIPITOR Take 1 tablet (80 mg total) by mouth daily at 6 PM.   furosemide 40 MG tablet Commonly known as:  LASIX Take 1 tablet (40 mg total) by mouth daily. What changed:    medication strength  how much to take  when to take this   hydrALAZINE 100 MG tablet Commonly known as:  APRESOLINE Take 1 tablet (100 mg total) by mouth 3 (three) times daily.   isosorbide mononitrate 60 MG 24 hr tablet Commonly known as:  IMDUR Take 1 tablet (60 mg total) by mouth daily.   labetalol 200 MG tablet Commonly known as:  NORMODYNE Take 2 tablets (400 mg total) by mouth 2 (two) times daily. What  changed:  how much to take   multivitamin with minerals Tabs tablet Take 1 tablet by mouth daily.   nitroGLYCERIN 0.4 MG SL tablet Commonly known as:  NITROSTAT Place 1 tablet (0.4 mg total) under the tongue every 5 (five) minutes as needed. For chest pain   potassium chloride SA 20 MEQ tablet Commonly known as:  K-DUR,KLOR-CON Take 2 tablets (40 mEq total) by mouth daily.   spironolactone 50 MG tablet Commonly known as:  ALDACTONE Take 1 tablet (50 mg total) by mouth daily.      Follow-up Information    Massie Maroon, FNP. Schedule an appointment as soon as possible for a visit in 1 week(s).   Specialty:  Family Medicine Contact information: [address] [place] [address] [phone]        Lewayne Bunting, MD. Schedule an appointment as soon as possible for a visit in 2 week(s).   Specialty:  Cardiology Contact information: [address] [place]  [place] [address] [phone]          Allergies  Allergen Reactions  . Penicillins Other (See Comments)    Makes his throat itch really bad, and feels like it is closing up.  Has patient had a PCN reaction causing immediate rash, facial/tongue/throat swelling, SOB or lightheadedness with hypotension: Yes Has patient had a PCN reaction causing severe rash involving mucus membranes or skin necrosis: No Has patient had a PCN reaction that required hospitalization: Yes Has patient had a PCN reaction occurring within the last 10 years: No If all of the above answers are "NO", then may proceed with Cephalospo  . Shrimp [Shellfish Allergy]     Throat swells    Consultations: Cardiology  Procedures/Studies: None  Subjective: Seen and examined at bedside.  Eager to go home today.  Asking paper prescription.  Denies headache, dizziness, nausea vomiting chest pain shortness of breath.  Discharge Exam: Vitals:   12/24/17 0535 12/24/17 1204  BP: (!) 169/95 113/70  Pulse: 68 72  Resp:   18  Temp: 97.8 F (36.6 C) (!) 97.5 F (36.4 C)  SpO2: 97% 97%   Vitals:   12/23/17 2048 12/24/17 0535 12/24/17 0700 12/24/17 1204  BP: (!) 155/91 (!) 169/95  113/70  Pulse: 81 68  72  Resp: 18   18  Temp: 98 F (36.7 C) 97.8 F (36.6 C)  (!) 97.5 F (36.4 C)  TempSrc: Oral Oral  Oral  SpO2: 98% 97%  97%  Weight:   102.6 kg (226 lb 1.6 oz)   Height:        General: Pt is alert, awake, not in acute distress Cardiovascular: RRR, S1/S2 +, no rubs, no gallops Respiratory: CTA bilaterally, no wheezing, no rhonchi Abdominal: Soft, NT, ND, bowel sounds + Extremities: no edema, no cyanosis    The results of significant diagnostics from this hospitalization (including imaging, microbiology, ancillary and laboratory) are listed below for reference.     Microbiology: No results found for this or any previous visit (from the past 240 hour(s)).   Labs: BNP (last 3 results) Recent Labs    12/21/17 1240  BNP 604.9*   Basic Metabolic Panel: Recent Labs  Lab 12/21/17 1140 12/21/17 1313 12/21/17 1819 12/22/17 0234 12/23/17 0522 12/24/17 0437  NA 139  --   --  140 142 140  K 2.9*  --   --  3.0* 3.4* 3.3*  CL 97*  --   --  99* 101 103  CO2 30  --   --  27 29 25   GLUCOSE 118*  --   --  109* 116* 106*  BUN 20  --   --  18 30* 30*  CREATININE 1.89*  --  1.85* 1.94* 2.66* 2.38*  CALCIUM 8.6*  --   --  8.7* 8.8* 8.7*  MG  --  1.8 1.8  --   --   --   PHOS  --   --   --   --   --  4.7*   Liver Function Tests: Recent Labs  Lab 12/24/17 0437  ALBUMIN 2.8*   No results for input(s): LIPASE, AMYLASE in the last 168 hours. No results for input(s): AMMONIA in the last 168 hours. CBC: Recent Labs  Lab 12/21/17 1140 12/21/17 1819  WBC 11.1* 9.9  HGB 15.0 14.7  HCT 43.8 44.6  MCV 91.4 91.2  PLT 301 287   Cardiac Enzymes: Recent Labs  Lab 12/21/17 1440 12/21/17 2100 12/22/17 0234 12/22/17 4403  TROPONINI 0.18* 0.21* 0.18* 0.16*   BNP: Invalid input(s):  POCBNP CBG: Recent Labs  Lab 12/23/17 2144 12/24/17 0751  GLUCAP 130* 124*   D-Dimer Recent Labs    12/21/17 1240  DDIMER 0.79*   Hgb A1c Recent Labs    12/21/17 1819  HGBA1C 6.3*   Lipid Profile Recent Labs    12/21/17 1819  CHOL 277*  HDL 53  LDLCALC 184*  TRIG 202*  CHOLHDL 5.2   Thyroid function studies No results for input(s): TSH, T4TOTAL, T3FREE, THYROIDAB in the last 72 hours.  Invalid input(s): FREET3 Anemia work up No results for input(s): VITAMINB12, FOLATE, FERRITIN, TIBC, IRON, RETICCTPCT in the last 72 hours. Urinalysis    Component Value Date/Time   COLORURINE YELLOW 12/23/2017 0738   APPEARANCEUR CLEAR 12/23/2017 0738   LABSPEC 1.017 12/23/2017 0738   PHURINE 5.0 12/23/2017 0738   GLUCOSEU NEGATIVE 12/23/2017 0738   HGBUR NEGATIVE 12/23/2017 0738   BILIRUBINUR NEGATIVE 12/23/2017 0738   KETONESUR NEGATIVE 12/23/2017 0738   PROTEINUR 100 (A) 12/23/2017 0738   UROBILINOGEN 0.2 09/21/2017 1030   NITRITE NEGATIVE 12/23/2017 0738   LEUKOCYTESUR NEGATIVE 12/23/2017 0738   Sepsis Labs Invalid input(s): PROCALCITONIN,  WBC,  LACTICIDVEN Microbiology No results found for this or any previous visit (from the past 240 hour(s)).   Time coordinating discharge: 33 minutes  SIGNED:   Maxie Barb, MD  [facility] 12/24/2017, 12:17 PM  If 7PM-7AM, please contact night-coverage [email or web address] Password [email or web address]

## 2017-12-24 NOTE — Progress Notes (Signed)
Discharged home iv and tele removed. Taken out via wheelchair, prescriptions and instructions given

## 2018-01-19 ENCOUNTER — Other Ambulatory Visit: Payer: Self-pay

## 2018-01-19 ENCOUNTER — Encounter (HOSPITAL_COMMUNITY): Payer: Self-pay | Admitting: Emergency Medicine

## 2018-01-19 ENCOUNTER — Emergency Department (HOSPITAL_COMMUNITY): Payer: Medicaid Other

## 2018-01-19 DIAGNOSIS — M79604 Pain in right leg: Secondary | ICD-10-CM | POA: Insufficient documentation

## 2018-01-19 DIAGNOSIS — Z7982 Long term (current) use of aspirin: Secondary | ICD-10-CM | POA: Insufficient documentation

## 2018-01-19 DIAGNOSIS — I2510 Atherosclerotic heart disease of native coronary artery without angina pectoris: Secondary | ICD-10-CM | POA: Diagnosis not present

## 2018-01-19 DIAGNOSIS — N189 Chronic kidney disease, unspecified: Secondary | ICD-10-CM | POA: Insufficient documentation

## 2018-01-19 DIAGNOSIS — I509 Heart failure, unspecified: Secondary | ICD-10-CM | POA: Insufficient documentation

## 2018-01-19 DIAGNOSIS — Z87891 Personal history of nicotine dependence: Secondary | ICD-10-CM | POA: Diagnosis not present

## 2018-01-19 DIAGNOSIS — Z79899 Other long term (current) drug therapy: Secondary | ICD-10-CM | POA: Insufficient documentation

## 2018-01-19 DIAGNOSIS — I130 Hypertensive heart and chronic kidney disease with heart failure and stage 1 through stage 4 chronic kidney disease, or unspecified chronic kidney disease: Secondary | ICD-10-CM | POA: Diagnosis not present

## 2018-01-19 NOTE — ED Triage Notes (Signed)
Pt states he fell 3 steps down having 10/10 right leg pain and swollen.

## 2018-01-20 ENCOUNTER — Emergency Department (HOSPITAL_COMMUNITY)
Admission: EM | Admit: 2018-01-20 | Discharge: 2018-01-20 | Disposition: A | Discharge status: Home / Self Care | Payer: Medicaid Other | Attending: Emergency Medicine | Admitting: Emergency Medicine

## 2018-01-20 ENCOUNTER — Emergency Department (HOSPITAL_COMMUNITY): Payer: Medicaid Other

## 2018-01-20 DIAGNOSIS — W19XXXA Unspecified fall, initial encounter: Secondary | ICD-10-CM

## 2018-01-20 DIAGNOSIS — M79604 Pain in right leg: Secondary | ICD-10-CM

## 2018-01-20 MED ORDER — HYDROCODONE-ACETAMINOPHEN 5-325 MG PO TABS: 2.0000 | ORAL_TABLET | Freq: Four times a day (QID) | ORAL | 0 refills | Status: AC | PRN

## 2018-01-20 MED ORDER — HYDROCODONE-ACETAMINOPHEN 5-325 MG PO TABS
2.0000 | ORAL_TABLET | Freq: Once | ORAL | Status: AC
  Administered 2018-01-20: 2 via ORAL
  Filled 2018-01-20: qty 2

## 2018-01-20 NOTE — ED Provider Notes (Signed)
TIME SEEN: 3:12 AM  CHIEF COMPLAINT: Right leg pain  HPI: Patient is a 52 year old male with history of coronary artery disease, CHF, chronic kidney disease, hypertension who presents to the emergency department the right leg pain.  States he slipped down 3 stairs and injured his right leg.  He is not sure how he landed but denies hitting his head or any loss of consciousness.  No neck or back pain.  Pain is in the anterior right thigh and right knee.  He is unable to bend at the hip or knee secondary to pain.  States he has not been able to put pressure on this leg but was able to walk with a cane.  Not on any blood thinners.  No fever, redness or warmth.  ROS: See HPI Constitutional: no fever  Eyes: no drainage  ENT: no runny nose   Cardiovascular:  no chest pain  Resp: no SOB  GI: no vomiting GU: no dysuria Integumentary: no rash  Allergy: no hives  Musculoskeletal: no leg swelling  Neurological: no slurred speech ROS otherwise negative  PAST MEDICAL HISTORY/PAST SURGICAL HISTORY:  Past Medical History:  Diagnosis Date  . CAD (coronary artery disease) 02/18/2009   Small OM occlusion with collaterals, 60% RCA, 40% LAD. April 2015    . Cardiomyopathy, nonischemic (HCC) 05/03/2016  . CHF (congestive heart failure) (HCC)   . CKD (chronic kidney disease)   . History of syncope 08/2008   Secondary to hypertension  . Hypertension, essential, benign    a. renal art [place] (6/14): no RAS  . Hypokalemia 05/03/2016  . Noncompliance   . Nonischemic cardiomyopathy (HCC)    EF 45%  . Obesity   . Sleep apnea   . Tobacco abuse     MEDICATIONS:  Prior to Admission medications   Medication Sig Start Date End Date Taking? Authorizing Provider  acetaminophen-codeine (TYLENOL #3) 300-30 MG tablet Take 1 tablet by mouth every 6 (six) hours as needed for moderate pain. 09/21/17   Massie Maroon, FNP  amLODipine (NORVASC) 10 MG tablet Take 1 tablet (10 mg total) by mouth daily. 12/24/17    Maxie Barb, MD  aspirin EC 81 MG tablet Take 1 tablet (81 mg total) by mouth daily. 12/23/15   Leatha Gilding, MD  atorvastatin (LIPITOR) 80 MG tablet Take 1 tablet (80 mg total) by mouth daily at 6 PM. 12/24/17   Maxie Barb, MD  furosemide (LASIX) 40 MG tablet Take 1 tablet (40 mg total) by mouth daily. 12/24/17   Maxie Barb, MD  hydrALAZINE (APRESOLINE) 100 MG tablet Take 1 tablet (100 mg total) by mouth 3 (three) times daily. 12/24/17   Maxie Barb, MD  isosorbide mononitrate (IMDUR) 60 MG 24 hr tablet Take 1 tablet (60 mg total) by mouth daily. 12/24/17   Maxie Barb, MD  labetalol (NORMODYNE) 200 MG tablet Take 2 tablets (400 mg total) by mouth 2 (two) times daily. 12/24/17   Maxie Barb, MD  Multiple Vitamin (MULTIVITAMIN WITH MINERALS) TABS tablet Take 1 tablet by mouth daily.    [provider]  nitroGLYCERIN (NITROSTAT) 0.4 MG SL tablet Place 1 tablet (0.4 mg total) under the tongue every 5 (five) minutes as needed. For chest pain 12/24/17   Maxie Barb, MD  potassium chloride SA (K-DUR,KLOR-CON) 20 MEQ tablet Take 2 tablets (40 mEq total) by mouth daily. 12/24/17   Maxie Barb, MD  spironolactone (ALDACTONE) 50 MG tablet Take 1 tablet (  50 mg total) by mouth daily. 12/24/17   Maxie Barb, MD    ALLERGIES:  Allergies  Allergen Reactions  . Penicillins Other (See Comments)    Makes his throat itch really bad, and feels like it is closing up.  Has patient had a PCN reaction causing immediate rash, facial/tongue/throat swelling, SOB or lightheadedness with hypotension: Yes Has patient had a PCN reaction causing severe rash involving mucus membranes or skin necrosis: No Has patient had a PCN reaction that required hospitalization: Yes Has patient had a PCN reaction occurring within the last 10 years: No If all of the above answers are "NO", then may proceed with Cephalospo  . Shrimp [Shellfish Allergy]      Throat swells    SOCIAL HISTORY:  Social History   Tobacco Use  . Smoking status: Former Smoker    Packs/day: 0.10    Years: 32.00    Pack years: 3.20    Types: Cigarettes  . Smokeless tobacco: Never Used  . Tobacco comment: Started smoking at age 78 (89).  Cutting back  Substance Use Topics  . Alcohol use: No    Alcohol/week: 0.0 oz    FAMILY HISTORY: Family History  Problem Relation Age of Onset  . Lupus Mother   . Heart disease Maternal Grandfather   . Coronary artery disease Neg Hx   . Adrenal disorder Neg Hx     EXAM: BP (!) 163/90   Pulse 63   Temp 97.7 F (36.5 C) (Oral)   Resp 16   Ht 5\' 7"  (1.702 m)   Wt 100.7 kg (222 lb)   SpO2 94%   BMI 34.77 kg/m  CONSTITUTIONAL: Alert and oriented and responds appropriately to questions. Well-appearing; well-nourished; GCS 15 HEAD: Normocephalic; atraumatic EYES: Conjunctivae clear, PERRL, EOMI ENT: normal nose; no rhinorrhea; moist mucous membranes; pharynx without lesions noted; no dental injury; no septal hematoma NECK: Supple, no meningismus, no LAD; no midline spinal tenderness, step-off or deformity; trachea midline CARD: RRR; S1 and S2 appreciated; no murmurs, no clicks, no rubs, no gallops RESP: Normal chest excursion without splinting or tachypnea; breath sounds clear and equal bilaterally; no wheezes, no rhonchi, no rales; no hypoxia or respiratory distress CHEST:  chest wall stable, no crepitus or ecchymosis or deformity, nontender to palpation; no flail chest ABD/GI: Normal bowel sounds; non-distended; soft, non-tender, no rebound, no guarding; no ecchymosis or other lesions noted PELVIS:  stable, nontender to palpation BACK:  The back appears normal and is non-tender to palpation, there is no CVA tenderness; no midline spinal tenderness, step-off or deformity EXT: No soft tissue swelling noted to the anterior distal right thigh and the knee without any obvious bony deformity.  There is no leg length  discrepancy.  No tenderness over the right hip, right tibia or fibula, right ankle or foot.  2+ right DP pulse present.  Decreased range of motion in the hip and knee secondary to pain in the right thigh.  There is no obvious hematoma noted.  There is no redness or warmth.  No rash or skin lesions.  No laceration.  Otherwise normal ROM in all joints; otherwise extremities are non-tender to palpation; no edema; normal capillary refill; no cyanosis, no joint effusion, compartments are soft, extremities are warm and well-perfused, no ecchymosis SKIN: Normal color for age and race; warm NEURO: Moves all extremities equally PSYCH: The patient's mood and manner are appropriate. Grooming and personal hygiene are appropriate.  MEDICAL DECISION MAKING: Patient here with mechanical fall.  X-rays of the right femur and knee show no bony abnormality.  He is able to fully straighten his leg therefore doubt any extensor injury to the knee.  Given how uncomfortable he is however will place him in a knee immobilizer is.  I have encouraged him to follow-up with orthopedics as an outpatient as he may need further imaging if symptoms are not improving.  Will discharge with brief course of pain medication.  At this time, I do not feel there is any life-threatening condition present. I have reviewed and discussed all results (EKG, imaging, lab, urine as appropriate) and exam findings with patient/family. I have reviewed nursing notes and appropriate previous records.  I feel the patient is safe to be discharged home without further emergent workup and can continue workup as an outpatient as needed. Discussed usual and customary return precautions. Patient/family verbalize understanding and are comfortable with this plan.  Outpatient follow-up has been provided if needed. All questions have been answered.      Ward, Layla Maw, DO 01/20/18 [phone]

## 2018-01-20 NOTE — Discharge Instructions (Signed)
Your x-ray showed no fracture today but given how much swelling you are having and how much discomfort I recommend close PCP follow-up.  If symptoms are not improving you should also see an orthopedic physician for repeat evaluation and possible further imaging.

## 2018-01-20 NOTE — ED Notes (Signed)
Pt having difficulty ambulating with crutches. Able to stand but unable to bear weight to leg. Prescription given for walker. Informed pt of follow up instructions; expressed understanding

## 2018-01-20 NOTE — ED Notes (Signed)
Pt is in A Hallway

## 2018-01-20 NOTE — Progress Notes (Signed)
Orthopedic Tech Progress Note Patient Details:  [name] 1966/05/02 [id number]  Ortho Devices Type of Ortho Device: Crutches, Knee Immobilizer Ortho Device/Splint Location: rle Ortho Device/Splint Interventions: Ordered, Application, Adjustment   Post Interventions Patient Tolerated: Well Instructions Provided: Care of device, Adjustment of device   Trinna Post 01/20/2018, 4:50 AM

## 2018-01-20 NOTE — ED Notes (Signed)
Write called for reassess vitals, no response

## 2018-01-24 ENCOUNTER — Other Ambulatory Visit: Payer: Self-pay

## 2018-01-24 ENCOUNTER — Inpatient Hospital Stay (INDEPENDENT_AMBULATORY_CARE_PROVIDER_SITE_OTHER): Payer: Self-pay | Admitting: Orthopaedic Surgery

## 2018-01-24 ENCOUNTER — Encounter (INDEPENDENT_AMBULATORY_CARE_PROVIDER_SITE_OTHER): Payer: Self-pay

## 2018-01-24 DIAGNOSIS — I160 Hypertensive urgency: Secondary | ICD-10-CM

## 2018-01-24 DIAGNOSIS — I10 Essential (primary) hypertension: Secondary | ICD-10-CM

## 2018-01-24 MED ORDER — AMLODIPINE BESYLATE 10 MG PO TABS: 10.0000 mg | ORAL_TABLET | Freq: Every day | ORAL | 0 refills | Status: AC

## 2018-01-24 MED ORDER — HYDRALAZINE HCL 100 MG PO TABS: 100.0000 mg | ORAL_TABLET | Freq: Three times a day (TID) | ORAL | 0 refills | Status: AC

## 2018-01-24 MED ORDER — FUROSEMIDE 40 MG PO TABS: 40.0000 mg | ORAL_TABLET | Freq: Every day | ORAL | 0 refills | Status: AC

## 2018-01-24 MED ORDER — ISOSORBIDE MONONITRATE ER 60 MG PO TB24
60.0000 mg | ORAL_TABLET | Freq: Every day | ORAL | 0 refills | Status: AC
Start: 2018-01-24 — End: ?

## 2018-01-24 MED ORDER — SPIRONOLACTONE 50 MG PO TABS: 50.0000 mg | ORAL_TABLET | Freq: Every day | ORAL | 0 refills | Status: AC

## 2018-01-24 MED ORDER — POTASSIUM CHLORIDE CRYS ER 20 MEQ PO TBCR: 40.0000 meq | EXTENDED_RELEASE_TABLET | Freq: Every day | ORAL | 0 refills | Status: AC

## 2018-02-05 ENCOUNTER — Emergency Department (HOSPITAL_COMMUNITY): Payer: Medicaid Other

## 2018-02-05 ENCOUNTER — Inpatient Hospital Stay (HOSPITAL_COMMUNITY)
Admission: EM | Admit: 2018-02-05 | Discharge: 2018-02-10 | DRG: 064 | Disposition: A | Discharge status: Rehabilitation Facility | Payer: Medicaid Other | Attending: Neurology | Admitting: Neurology

## 2018-02-05 ENCOUNTER — Other Ambulatory Visit: Payer: Self-pay

## 2018-02-05 DIAGNOSIS — I2510 Atherosclerotic heart disease of native coronary artery without angina pectoris: Secondary | ICD-10-CM | POA: Diagnosis present

## 2018-02-05 DIAGNOSIS — G709 Myoneural disorder, unspecified: Secondary | ICD-10-CM | POA: Diagnosis present

## 2018-02-05 DIAGNOSIS — I69398 Other sequelae of cerebral infarction: Secondary | ICD-10-CM | POA: Diagnosis not present

## 2018-02-05 DIAGNOSIS — G839 Paralytic syndrome, unspecified: Secondary | ICD-10-CM | POA: Diagnosis present

## 2018-02-05 DIAGNOSIS — Z6834 Body mass index (BMI) 34.0-34.9, adult: Secondary | ICD-10-CM

## 2018-02-05 DIAGNOSIS — N183 Chronic kidney disease, stage 3 unspecified: Secondary | ICD-10-CM | POA: Diagnosis present

## 2018-02-05 DIAGNOSIS — R0600 Dyspnea, unspecified: Secondary | ICD-10-CM

## 2018-02-05 DIAGNOSIS — F1410 Cocaine abuse, uncomplicated: Secondary | ICD-10-CM

## 2018-02-05 DIAGNOSIS — H510 Palsy (spasm) of conjugate gaze: Secondary | ICD-10-CM | POA: Diagnosis present

## 2018-02-05 DIAGNOSIS — R000 Tachycardia, unspecified: Secondary | ICD-10-CM | POA: Diagnosis not present

## 2018-02-05 DIAGNOSIS — I69322 Dysarthria following cerebral infarction: Secondary | ICD-10-CM | POA: Diagnosis not present

## 2018-02-05 DIAGNOSIS — I69391 Dysphagia following cerebral infarction: Secondary | ICD-10-CM | POA: Diagnosis not present

## 2018-02-05 DIAGNOSIS — G4733 Obstructive sleep apnea (adult) (pediatric): Secondary | ICD-10-CM | POA: Diagnosis present

## 2018-02-05 DIAGNOSIS — J9601 Acute respiratory failure with hypoxia: Secondary | ICD-10-CM

## 2018-02-05 DIAGNOSIS — R471 Dysarthria and anarthria: Secondary | ICD-10-CM | POA: Diagnosis present

## 2018-02-05 DIAGNOSIS — R1312 Dysphagia, oropharyngeal phase: Secondary | ICD-10-CM | POA: Diagnosis present

## 2018-02-05 DIAGNOSIS — R29810 Facial weakness: Secondary | ICD-10-CM | POA: Diagnosis present

## 2018-02-05 DIAGNOSIS — G936 Cerebral edema: Secondary | ICD-10-CM | POA: Diagnosis present

## 2018-02-05 DIAGNOSIS — J9811 Atelectasis: Secondary | ICD-10-CM | POA: Diagnosis present

## 2018-02-05 DIAGNOSIS — N179 Acute kidney failure, unspecified: Secondary | ICD-10-CM | POA: Diagnosis present

## 2018-02-05 DIAGNOSIS — I613 Nontraumatic intracerebral hemorrhage in brain stem: Secondary | ICD-10-CM | POA: Diagnosis present

## 2018-02-05 DIAGNOSIS — I619 Nontraumatic intracerebral hemorrhage, unspecified: Secondary | ICD-10-CM | POA: Insufficient documentation

## 2018-02-05 DIAGNOSIS — Z7982 Long term (current) use of aspirin: Secondary | ICD-10-CM

## 2018-02-05 DIAGNOSIS — J9690 Respiratory failure, unspecified, unspecified whether with hypoxia or hypercapnia: Secondary | ICD-10-CM

## 2018-02-05 DIAGNOSIS — I10 Essential (primary) hypertension: Secondary | ICD-10-CM | POA: Diagnosis not present

## 2018-02-05 DIAGNOSIS — I6502 Occlusion and stenosis of left vertebral artery: Secondary | ICD-10-CM | POA: Diagnosis present

## 2018-02-05 DIAGNOSIS — R7989 Other specified abnormal findings of blood chemistry: Secondary | ICD-10-CM | POA: Diagnosis not present

## 2018-02-05 DIAGNOSIS — I615 Nontraumatic intracerebral hemorrhage, intraventricular: Secondary | ICD-10-CM | POA: Diagnosis present

## 2018-02-05 DIAGNOSIS — Z91013 Allergy to seafood: Secondary | ICD-10-CM

## 2018-02-05 DIAGNOSIS — I130 Hypertensive heart and chronic kidney disease with heart failure and stage 1 through stage 4 chronic kidney disease, or unspecified chronic kidney disease: Secondary | ICD-10-CM | POA: Diagnosis present

## 2018-02-05 DIAGNOSIS — E669 Obesity, unspecified: Secondary | ICD-10-CM | POA: Diagnosis present

## 2018-02-05 DIAGNOSIS — Z9119 Patient's noncompliance with other medical treatment and regimen: Secondary | ICD-10-CM

## 2018-02-05 DIAGNOSIS — I509 Heart failure, unspecified: Secondary | ICD-10-CM | POA: Diagnosis present

## 2018-02-05 DIAGNOSIS — I428 Other cardiomyopathies: Secondary | ICD-10-CM | POA: Diagnosis present

## 2018-02-05 DIAGNOSIS — Z9114 Patient's other noncompliance with medication regimen: Secondary | ICD-10-CM

## 2018-02-05 DIAGNOSIS — E785 Hyperlipidemia, unspecified: Secondary | ICD-10-CM

## 2018-02-05 DIAGNOSIS — I161 Hypertensive emergency: Secondary | ICD-10-CM | POA: Diagnosis present

## 2018-02-05 DIAGNOSIS — R739 Hyperglycemia, unspecified: Secondary | ICD-10-CM | POA: Diagnosis present

## 2018-02-05 DIAGNOSIS — Z8639 Personal history of other endocrine, nutritional and metabolic disease: Secondary | ICD-10-CM

## 2018-02-05 DIAGNOSIS — R29708 NIHSS score 8: Secondary | ICD-10-CM | POA: Diagnosis present

## 2018-02-05 DIAGNOSIS — R269 Unspecified abnormalities of gait and mobility: Secondary | ICD-10-CM | POA: Diagnosis not present

## 2018-02-05 DIAGNOSIS — Z880 Allergy status to penicillin: Secondary | ICD-10-CM

## 2018-02-05 DIAGNOSIS — I5042 Chronic combined systolic (congestive) and diastolic (congestive) heart failure: Secondary | ICD-10-CM | POA: Diagnosis not present

## 2018-02-05 DIAGNOSIS — R7303 Prediabetes: Secondary | ICD-10-CM | POA: Diagnosis present

## 2018-02-05 DIAGNOSIS — I5030 Unspecified diastolic (congestive) heart failure: Secondary | ICD-10-CM | POA: Diagnosis not present

## 2018-02-05 DIAGNOSIS — Z9989 Dependence on other enabling machines and devices: Secondary | ICD-10-CM

## 2018-02-05 DIAGNOSIS — Z87891 Personal history of nicotine dependence: Secondary | ICD-10-CM

## 2018-02-05 DIAGNOSIS — H532 Diplopia: Secondary | ICD-10-CM | POA: Diagnosis present

## 2018-02-05 DIAGNOSIS — R414 Neurologic neglect syndrome: Secondary | ICD-10-CM | POA: Diagnosis present

## 2018-02-05 DIAGNOSIS — Z79899 Other long term (current) drug therapy: Secondary | ICD-10-CM

## 2018-02-05 DIAGNOSIS — R40241 Glasgow coma scale score 13-15, unspecified time: Secondary | ICD-10-CM | POA: Diagnosis present

## 2018-02-05 LAB — CBG MONITORING, ED: GLUCOSE-CAPILLARY: 200 mg/dL — AB (ref 65–99)

## 2018-02-05 LAB — COMPREHENSIVE METABOLIC PANEL
ALT: 34 U/L (ref 17–63)
AST: 21 U/L (ref 15–41)
Albumin: 3.9 g/dL (ref 3.5–5.0)
Alkaline Phosphatase: 67 U/L (ref 38–126)
Anion gap: 13 (ref 5–15)
BUN: 24 mg/dL — ABNORMAL HIGH (ref 6–20)
CHLORIDE: 102 mmol/L (ref 101–111)
CO2: 22 mmol/L (ref 22–32)
CREATININE: 1.86 mg/dL — AB (ref 0.61–1.24)
Calcium: 9 mg/dL (ref 8.9–10.3)
GFR calc non Af Amer: 40 mL/min — ABNORMAL LOW (ref >60)
GFR, EST AFRICAN AMERICAN: 46 mL/min — AB (ref >60)
Glucose, Bld: 193 mg/dL — ABNORMAL HIGH (ref 65–99)
Potassium: 3.8 mmol/L (ref 3.5–5.1)
SODIUM: 137 mmol/L (ref 135–145)
Total Bilirubin: 0.7 mg/dL (ref 0.3–1.2)
Total Protein: 8.1 g/dL (ref 6.5–8.1)

## 2018-02-05 LAB — DIFFERENTIAL
BASOS ABS: 0 10*3/uL (ref 0.0–0.1)
BASOS PCT: 0 %
Eosinophils Absolute: 0.3 10*3/uL (ref 0.0–0.7)
Eosinophils Relative: 3 %
Lymphocytes Relative: 46 %
Lymphs Abs: 4 10*3/uL (ref 0.7–4.0)
MONO ABS: 0.9 10*3/uL (ref 0.1–1.0)
Monocytes Relative: 10 %
NEUTROS ABS: 3.6 10*3/uL (ref 1.7–7.7)
NEUTROS PCT: 41 %

## 2018-02-05 LAB — I-STAT ARTERIAL BLOOD GAS, ED
Acid-Base Excess: 4 mmol/L — ABNORMAL HIGH (ref 0.0–2.0)
Bicarbonate: 32.8 mmol/L — ABNORMAL HIGH (ref 20.0–28.0)
O2 Saturation: 98 %
PCO2 ART: 68.1 mmHg — AB (ref 32.0–48.0)
TCO2: 35 mmol/L — ABNORMAL HIGH (ref 22–32)
pH, Arterial: 7.29 — ABNORMAL LOW (ref 7.350–7.450)
pO2, Arterial: 120 mmHg — ABNORMAL HIGH (ref 83.0–108.0)

## 2018-02-05 LAB — I-STAT CHEM 8, ED
BUN: 26 mg/dL — ABNORMAL HIGH (ref 6–20)
CHLORIDE: 101 mmol/L (ref 101–111)
Calcium, Ion: 1.1 mmol/L — ABNORMAL LOW (ref 1.15–1.40)
Creatinine, Ser: 1.9 mg/dL — ABNORMAL HIGH (ref 0.61–1.24)
GLUCOSE: 199 mg/dL — AB (ref 65–99)
HCT: 44 % (ref 39.0–52.0)
Hemoglobin: 15 g/dL (ref 13.0–17.0)
POTASSIUM: 3.8 mmol/L (ref 3.5–5.1)
SODIUM: 140 mmol/L (ref 135–145)
TCO2: 26 mmol/L (ref 22–32)

## 2018-02-05 LAB — CBC
HCT: 40.6 % (ref 39.0–52.0)
Hemoglobin: 14 g/dL (ref 13.0–17.0)
MCH: 31.5 pg (ref 26.0–34.0)
MCHC: 34.5 g/dL (ref 30.0–36.0)
MCV: 91.4 fL (ref 78.0–100.0)
PLATELETS: 300 10*3/uL (ref 150–400)
RBC: 4.44 MIL/uL (ref 4.22–5.81)
RDW: 14 % (ref 11.5–15.5)
WBC: 8.8 10*3/uL (ref 4.0–10.5)

## 2018-02-05 LAB — PROTIME-INR
INR: 0.93
PROTHROMBIN TIME: 12.4 s (ref 11.4–15.2)

## 2018-02-05 LAB — URINALYSIS, ROUTINE W REFLEX MICROSCOPIC
Bilirubin Urine: NEGATIVE
Glucose, UA: 50 mg/dL — AB
KETONES UR: NEGATIVE mg/dL
Leukocytes, UA: NEGATIVE
Nitrite: NEGATIVE
Specific Gravity, Urine: 1.017 (ref 1.005–1.030)
pH: 5 (ref 5.0–8.0)

## 2018-02-05 LAB — RAPID URINE DRUG SCREEN, HOSP PERFORMED
AMPHETAMINES: NOT DETECTED
BENZODIAZEPINES: NOT DETECTED
Barbiturates: NOT DETECTED
COCAINE: POSITIVE — AB
OPIATES: NOT DETECTED
Tetrahydrocannabinol: NOT DETECTED

## 2018-02-05 LAB — I-STAT TROPONIN, ED: Troponin i, poc: 0.04 ng/mL (ref 0.00–0.08)

## 2018-02-05 LAB — GLUCOSE, CAPILLARY
Glucose-Capillary: 133 mg/dL — ABNORMAL HIGH (ref 65–99)
Glucose-Capillary: 114 mg/dL — ABNORMAL HIGH (ref 65–99)

## 2018-02-05 LAB — ETHANOL

## 2018-02-05 LAB — MRSA PCR SCREENING: MRSA BY PCR: NEGATIVE

## 2018-02-05 LAB — TRIGLYCERIDES: Triglycerides: 172 mg/dL — ABNORMAL HIGH (ref <150)

## 2018-02-05 LAB — APTT: APTT: 28 s (ref 24–36)

## 2018-02-05 MED ORDER — HYDRALAZINE HCL 50 MG PO TABS
100.0000 mg | ORAL_TABLET | Freq: Three times a day (TID) | ORAL | Status: DC
  Administered 2018-02-05 – 2018-02-10 (×16): 100 mg via ORAL
  Filled 2018-02-05 (×16): qty 2

## 2018-02-05 MED ORDER — IPRATROPIUM-ALBUTEROL 0.5-2.5 (3) MG/3ML IN SOLN
3.0000 mL | Freq: Four times a day (QID) | RESPIRATORY_TRACT | Status: DC
  Administered 2018-02-05 – 2018-02-09 (×16): 3 mL via RESPIRATORY_TRACT
  Filled 2018-02-05 (×17): qty 3

## 2018-02-05 MED ORDER — PANTOPRAZOLE SODIUM 40 MG IV SOLR: 40.0000 mg | Freq: Every day | INTRAVENOUS | Status: DC

## 2018-02-05 MED ORDER — POTASSIUM CHLORIDE IN NACL 20-0.9 MEQ/L-% IV SOLN
INTRAVENOUS | Status: DC
  Administered 2018-02-05 – 2018-02-10 (×6): via INTRAVENOUS
  Filled 2018-02-05 (×7): qty 1000

## 2018-02-05 MED ORDER — LABETALOL HCL 5 MG/ML IV SOLN: 20.0000 mg | Freq: Once | INTRAVENOUS | Status: DC

## 2018-02-05 MED ORDER — LABETALOL HCL 5 MG/ML IV SOLN
20.0000 mg | Freq: Once | INTRAVENOUS | Status: DC
  Filled 2018-02-05: qty 4

## 2018-02-05 MED ORDER — AMLODIPINE BESYLATE 10 MG PO TABS
10.0000 mg | ORAL_TABLET | Freq: Every day | ORAL | Status: DC
  Administered 2018-02-06 – 2018-02-10 (×5): 10 mg via ORAL
  Filled 2018-02-05 (×4): qty 2
  Filled 2018-02-05: qty 1

## 2018-02-05 MED ORDER — ROCURONIUM BROMIDE 50 MG/5ML IV SOLN
INTRAVENOUS | Status: AC | PRN
  Administered 2018-02-05: 100 mg via INTRAVENOUS

## 2018-02-05 MED ORDER — PROPOFOL 1000 MG/100ML IV EMUL
INTRAVENOUS | Status: AC
  Filled 2018-02-05: qty 100

## 2018-02-05 MED ORDER — PROPOFOL 1000 MG/100ML IV EMUL
5.0000 ug/kg/min | Freq: Once | INTRAVENOUS | Status: DC
  Administered 2018-02-05: 5 ug/kg/min via INTRAVENOUS

## 2018-02-05 MED ORDER — PROPOFOL 1000 MG/100ML IV EMUL
5.0000 ug/kg/min | INTRAVENOUS | Status: DC
  Administered 2018-02-05: 10 ug/kg/min via INTRAVENOUS
  Administered 2018-02-06 – 2018-02-07 (×4): 15 ug/kg/min via INTRAVENOUS
  Filled 2018-02-05 (×4): qty 100

## 2018-02-05 MED ORDER — ETOMIDATE 2 MG/ML IV SOLN
INTRAVENOUS | Status: AC | PRN
  Administered 2018-02-05: 20 mg via INTRAVENOUS

## 2018-02-05 MED ORDER — PANTOPRAZOLE SODIUM 40 MG IV SOLR
40.0000 mg | INTRAVENOUS | Status: DC
  Administered 2018-02-05 – 2018-02-09 (×5): 40 mg via INTRAVENOUS
  Filled 2018-02-05 (×4): qty 40

## 2018-02-05 MED ORDER — ACETAMINOPHEN 160 MG/5ML PO SOLN
650.0000 mg | ORAL | Status: DC | PRN
  Administered 2018-02-07 – 2018-02-08 (×2): 650 mg
  Filled 2018-02-05 (×2): qty 20.3

## 2018-02-05 MED ORDER — LABETALOL HCL 5 MG/ML IV SOLN
INTRAVENOUS | Status: AC
  Filled 2018-02-05: qty 4

## 2018-02-05 MED ORDER — SODIUM CHLORIDE 0.9 % IV SOLN
INTRAVENOUS | Status: DC
  Filled 2018-02-05: qty 1000

## 2018-02-05 MED ORDER — PROPOFOL 1000 MG/100ML IV EMUL
5.0000 ug/kg/min | Freq: Once | INTRAVENOUS | Status: AC
  Administered 2018-02-05: 15 ug/kg/min via INTRAVENOUS

## 2018-02-05 MED ORDER — IOPAMIDOL (ISOVUE-370) INJECTION 76%
INTRAVENOUS | Status: AC
  Filled 2018-02-05: qty 50

## 2018-02-05 MED ORDER — INSULIN ASPART 100 UNIT/ML ~~LOC~~ SOLN
0.0000 [IU] | Freq: Three times a day (TID) | SUBCUTANEOUS | Status: DC
  Administered 2018-02-05: 2 [IU] via SUBCUTANEOUS
  Administered 2018-02-08 – 2018-02-09 (×2): 1 [IU] via SUBCUTANEOUS
  Administered 2018-02-09: 2 [IU] via SUBCUTANEOUS
  Administered 2018-02-10 (×2): 1 [IU] via SUBCUTANEOUS

## 2018-02-05 MED ORDER — IOPAMIDOL (ISOVUE-370) INJECTION 76%
INTRAVENOUS | Status: AC
  Filled 2018-02-05: qty 100

## 2018-02-05 MED ORDER — METOPROLOL TARTRATE 5 MG/5ML IV SOLN
INTRAVENOUS | Status: AC
  Filled 2018-02-05: qty 5

## 2018-02-05 MED ORDER — SENNOSIDES-DOCUSATE SODIUM 8.6-50 MG PO TABS
1.0000 | ORAL_TABLET | Freq: Two times a day (BID) | ORAL | Status: DC
  Administered 2018-02-05 – 2018-02-10 (×10): 1 via ORAL
  Filled 2018-02-05 (×10): qty 1

## 2018-02-05 MED ORDER — FUROSEMIDE 40 MG PO TABS
40.0000 mg | ORAL_TABLET | Freq: Every day | ORAL | Status: DC
  Administered 2018-02-06 – 2018-02-10 (×5): 40 mg via ORAL
  Filled 2018-02-05 (×2): qty 2
  Filled 2018-02-05: qty 1
  Filled 2018-02-05 (×2): qty 2

## 2018-02-05 MED ORDER — ACETAMINOPHEN 325 MG PO TABS
650.0000 mg | ORAL_TABLET | ORAL | Status: DC | PRN
  Administered 2018-02-08 – 2018-02-10 (×2): 650 mg via ORAL
  Filled 2018-02-05 (×2): qty 2

## 2018-02-05 MED ORDER — ORAL CARE MOUTH RINSE
15.0000 mL | OROMUCOSAL | Status: DC
  Administered 2018-02-05 – 2018-02-08 (×29): 15 mL via OROMUCOSAL

## 2018-02-05 MED ORDER — LIDOCAINE HCL (CARDIAC) 20 MG/ML IV SOLN
INTRAVENOUS | Status: AC | PRN
  Administered 2018-02-05: 100 mg via INTRAVENOUS

## 2018-02-05 MED ORDER — POTASSIUM CHLORIDE CRYS ER 20 MEQ PO TBCR
40.0000 meq | EXTENDED_RELEASE_TABLET | Freq: Every day | ORAL | Status: DC
  Administered 2018-02-06 – 2018-02-07 (×2): 40 meq via ORAL
  Filled 2018-02-05 (×3): qty 2

## 2018-02-05 MED ORDER — SPIRONOLACTONE 25 MG PO TABS
50.0000 mg | ORAL_TABLET | Freq: Every day | ORAL | Status: DC
  Administered 2018-02-06 – 2018-02-10 (×5): 50 mg via ORAL
  Filled 2018-02-05: qty 2
  Filled 2018-02-05 (×5): qty 1

## 2018-02-05 MED ORDER — STROKE: EARLY STAGES OF RECOVERY BOOK
Freq: Once | Status: DC
  Filled 2018-02-05: qty 1

## 2018-02-05 MED ORDER — METOPROLOL TARTRATE 5 MG/5ML IV SOLN: 5.0000 mg | Freq: Once | INTRAVENOUS | Status: AC

## 2018-02-05 MED ORDER — NICARDIPINE HCL IN NACL 20-0.86 MG/200ML-% IV SOLN
0.0000 mg/h | INTRAVENOUS | Status: DC
  Administered 2018-02-05: 15 mg/h via INTRAVENOUS
  Administered 2018-02-05: 12.5 mg/h via INTRAVENOUS
  Administered 2018-02-05: 13 mg/h via INTRAVENOUS
  Administered 2018-02-05: 10 mg/h via INTRAVENOUS
  Administered 2018-02-05 (×2): 12 mg/h via INTRAVENOUS
  Administered 2018-02-05: 7.5 mg/h via INTRAVENOUS
  Administered 2018-02-06 (×5): 15 mg/h via INTRAVENOUS
  Filled 2018-02-05 (×3): qty 200
  Filled 2018-02-05: qty 400
  Filled 2018-02-05: qty 200
  Filled 2018-02-05 (×3): qty 400
  Filled 2018-02-05 (×2): qty 200

## 2018-02-05 MED ORDER — CHLORHEXIDINE GLUCONATE 0.12% ORAL RINSE (MEDLINE KIT)
15.0000 mL | Freq: Two times a day (BID) | OROMUCOSAL | Status: DC
  Administered 2018-02-05 – 2018-02-08 (×8): 15 mL via OROMUCOSAL

## 2018-02-05 MED ORDER — ACETAMINOPHEN 650 MG RE SUPP: 650.0000 mg | RECTAL | Status: DC | PRN

## 2018-02-05 MED ORDER — ISOSORBIDE MONONITRATE ER 60 MG PO TB24
60.0000 mg | ORAL_TABLET | Freq: Every day | ORAL | Status: DC
  Administered 2018-02-06 – 2018-02-10 (×4): 60 mg via ORAL
  Filled 2018-02-05 (×2): qty 2
  Filled 2018-02-05: qty 1
  Filled 2018-02-05 (×2): qty 2

## 2018-02-05 NOTE — Progress Notes (Signed)
0840 ABG results given to ED MD, RR increased to 20 at this time.

## 2018-02-05 NOTE — ED Provider Notes (Signed)
[facility] EMERGENCY DEPARTMENT Provider Note   CSN: [id number] Arrival date & time: 02/05/18  4496     History   Chief Complaint Chief Complaint  Patient presents with  . Code Stroke    HPI [name] is a 52 y.o. male.  Level 5 caveat for acuity of condition and altered mental status.  Patient presents by EMS with wakeup stroke around 6 AM.  Last seen normal at 11 PM.  Patient found to have severe onset headache, diaphoresis, dysarthria, double vision, right-sided gaze.  He is moving his arms and legs equally.  He denies chest pain or shortness of breath.  Denies any trauma.  He does not take any blood thinners.  Patient taken emergently to head CT.  Patient then intubated upon return to room given worsening mental status.   The history is provided by the patient, the EMS personnel and a relative. The history is limited by the condition of the patient.    Past Medical History:  Diagnosis Date  . CAD (coronary artery disease) 02/18/2009   Small OM occlusion with collaterals, 60% RCA, 40% LAD. April 2015    . Cardiomyopathy, nonischemic ([place]) 05/03/2016  . CHF (congestive heart failure) ([place])   . CKD (chronic kidney disease)   . History of syncope 08/2008   Secondary to hypertension  . Hypertension, essential, benign    a. renal art [place] (6/14): no RAS  . Hypokalemia 05/03/2016  . Noncompliance   . Nonischemic cardiomyopathy (H[facility])    EF 45%  . Obesity   . Sleep apnea   . Tobacco abuse     Patient Active Problem List   Diagnosis Date Noted  . COPD with acute bronchitis ([place]) 12/21/2017  . Hyperaldosteronism ([place]) 03/20/2017  . Renal failure 03/17/2017  . Prediabetes 01/21/2017  . Weight gain 01/21/2017  . Noncompliance   . Sleep apnea   . Chronic combined systolic (congestive) and diastolic (congestive) heart failure ([place]) 08/15/2016  . Hypertensive crisis 08/15/2016  . Hypokalemia 05/03/2016  . Cardiomyopathy, nonischemic ([place]) 05/03/2016    . Sinus bradycardia 05/03/2016  . ADHF (acute decompensated heart failure) 04/30/2016  . OSA (obstructive sleep apnea)   . Pain in the chest   . Cranial nerve VI palsy   . Chest pain   . NSTEMI (non-ST elevated myocardial infarction) ([place])   . Abnormal stress test   . SOB (shortness of breath)   . Hypertensive emergency 12/15/2015  . Elevated troponin 12/15/2015  . Pulmonary edema 12/15/2015  . CKD (chronic kidney disease), stage III ([place]) 12/15/2015  . Hepatitis 12/15/2015  . Hypertensive urgency, malignant 09/08/2014  . Erectile dysfunction 10/29/2013  . OBSTRUCTIVE SLEEP APNEA 11/24/2009  . OBESITY 02/18/2009  . Essential hypertension 02/18/2009  . CAD (coronary artery disease) 02/18/2009  . CHEST PAIN-UNSPECIFIED 02/18/2009  . SYNCOPE, HX OF 02/18/2009    Past Surgical History:  Procedure Laterality Date  . CARDIAC CATHETERIZATION N/A 12/22/2015   Procedure: Left Heart Cath and Coronary Angiography;  Surgeon: Belva Crome, MD;  Location: [facility] CV LAB;  Service: Cardiovascular;  Laterality: N/A;  . IR US GUIDE VASC ACCESS RIGHT  04/14/2017  . IR VENOGRAM ADRENAL BI  04/14/2017  . IR VENOGRAM RENAL BI  04/14/2017  . IR VENOUS SAMPLING  04/14/2017  . IR VENOUS SAMPLING  04/14/2017  . LEFT HEART CATHETERIZATION WITH CORONARY ANGIOGRAM N/A 02/21/2014   Procedure: LEFT HEART CATHETERIZATION WITH CORONARY ANGIOGRAM;  Surgeon: Burnell Blanks, MD;  Location:  [facility] CATH LAB;  Service: Cardiovascular;  Laterality: N/A;  . None         Home Medications    Prior to Admission medications   Medication Sig Start Date End Date Taking? Authorizing Provider  acetaminophen-codeine (TYLENOL #3) 300-30 MG tablet Take 1 tablet by mouth every 6 (six) hours as needed for moderate pain. 09/21/17   Dorena Dew, FNP  amLODipine (NORVASC) 10 MG tablet Take 1 tablet (10 mg total) by mouth daily. 01/24/18   Lelon Perla, MD  aspirin EC 81 MG tablet Take 1 tablet (81 mg total) by  mouth daily. 12/23/15   Caren Griffins, MD  atorvastatin (LIPITOR) 80 MG tablet Take 1 tablet (80 mg total) by mouth daily at 6 PM. 12/24/17   Rosita Fire, MD  furosemide (LASIX) 40 MG tablet Take 1 tablet (40 mg total) by mouth daily. 01/24/18   Lelon Perla, MD  hydrALAZINE (APRESOLINE) 100 MG tablet Take 1 tablet (100 mg total) by mouth 3 (three) times daily. 01/24/18   Lelon Perla, MD  HYDROcodone-acetaminophen (NORCO/VICODIN) 5-325 MG tablet Take 2 tablets by mouth every 6 (six) hours as needed. 01/20/18   Ward, Delice Bison, DO  isosorbide mononitrate (IMDUR) 60 MG 24 hr tablet Take 1 tablet (60 mg total) by mouth daily. 01/24/18   Lelon Perla, MD  labetalol (NORMODYNE) 200 MG tablet Take 2 tablets (400 mg total) by mouth 2 (two) times daily. 12/24/17   Rosita Fire, MD  Multiple Vitamin (MULTIVITAMIN WITH MINERALS) TABS tablet Take 1 tablet by mouth daily.    [provider]  nitroGLYCERIN (NITROSTAT) 0.4 MG SL tablet Place 1 tablet (0.4 mg total) under the tongue every 5 (five) minutes as needed. For chest pain 12/24/17   Rosita Fire, MD  potassium chloride SA (K-DUR,KLOR-CON) 20 MEQ tablet Take 2 tablets (40 mEq total) by mouth daily. 01/24/18   Lelon Perla, MD  spironolactone (ALDACTONE) 50 MG tablet Take 1 tablet (50 mg total) by mouth daily. 01/24/18   Lelon Perla, MD    Family History Family History  Problem Relation Age of Onset  . Lupus Mother   . Heart disease Maternal Grandfather   . Coronary artery disease Neg Hx   . Adrenal disorder Neg Hx     Social History Social History   Tobacco Use  . Smoking status: Former Smoker    Packs/day: 0.10    Years: 32.00    Pack years: 3.20    Types: Cigarettes  . Smokeless tobacco: Never Used  . Tobacco comment: Started smoking at age 21 (30).  Cutting back  Substance Use Topics  . Alcohol use: No    Alcohol/week: 0.0 oz  . Drug use: No     Allergies   Penicillins and Shrimp  [shellfish allergy]   Review of Systems Review of Systems  Unable to perform ROS: Acuity of condition     Physical Exam Updated Vital Signs BP (!) 173/99   Pulse 94   Temp (!) 96 F (35.6 C) (Oral)   Resp 15   SpO2 98%   Physical Exam  Constitutional: He is oriented to person, place, and time. He appears well-developed and well-nourished. He appears distressed.  HENT:  Head: Normocephalic and atraumatic.  Mouth/Throat: Oropharynx is clear and moist. No oropharyngeal exudate.  Eyes: Conjunctivae and EOM are normal. Pupils are equal, round, and reactive to light.  Neck: Normal range of motion. Neck supple.  No  meningismus.  Cardiovascular: Normal rate, regular rhythm, normal heart sounds and intact distal pulses.  No murmur heard. Pulmonary/Chest: Effort normal and breath sounds normal. No respiratory distress. He exhibits no tenderness.  Abdominal: Soft. There is no tenderness. There is no rebound and no guarding.  Musculoskeletal: Normal range of motion. He exhibits no edema or tenderness.  Neurological: He is alert and oriented to person, place, and time. No cranial nerve deficit. He exhibits normal muscle tone. Coordination normal.  Severe dysarthria, left-sided facial droop, tongue is midline.  Right-sided gaze, cannot cross midline.  No pronator drift. 5/5 strength throughout. Equal grip strength. Sensation intact.   Skin: Skin is warm. Capillary refill takes less than 2 seconds. No rash noted. He is diaphoretic.  Psychiatric: He has a normal mood and affect. His behavior is normal.  Nursing note and vitals reviewed.    ED Treatments / Results  Labs (all labs ordered are listed, but only abnormal results are displayed) Labs Reviewed  COMPREHENSIVE METABOLIC PANEL - Abnormal; Notable for the following components:      Result Value   Glucose, Bld 193 (*)    BUN 24 (*)    Creatinine, Ser 1.86 (*)    GFR calc non Af Amer 40 (*)    GFR calc Af Amer 46 (*)    All  other components within normal limits  CBG MONITORING, ED - Abnormal; Notable for the following components:   Glucose-Capillary 200 (*)    All other components within normal limits  I-STAT CHEM 8, ED - Abnormal; Notable for the following components:   BUN 26 (*)    Creatinine, Ser 1.90 (*)    Glucose, Bld 199 (*)    Calcium, Ion 1.10 (*)    All other components within normal limits  I-STAT ARTERIAL BLOOD GAS, ED - Abnormal; Notable for the following components:   pH, Arterial 7.290 (*)    pCO2 arterial 68.1 (*)    pO2, Arterial 120.0 (*)    Bicarbonate 32.8 (*)    TCO2 35 (*)    Acid-Base Excess 4.0 (*)    All other components within normal limits  PROTIME-INR  APTT  CBC  DIFFERENTIAL  ETHANOL  RAPID URINE DRUG SCREEN, HOSP PERFORMED  URINALYSIS, ROUTINE W REFLEX MICROSCOPIC  I-STAT TROPONIN, ED  CBG MONITORING, ED  I-STAT CHEM 8, ED  I-STAT TROPONIN, ED    EKG  EKG Interpretation None       Radiology Ct Head Code Stroke Wo Contrast  Result Date: 02/05/2018 CLINICAL DATA:  Code stroke.  Double vision, fixed gaze, headache EXAM: CT HEAD WITHOUT CONTRAST TECHNIQUE: Contiguous axial images were obtained from the base of the skull through the vertex without intravenous contrast. COMPARISON:  MR brain 12/19/2015 FINDINGS: Brain: There is an acute dorsal LEFT paramedian hemorrhage affecting the lower pons and upper medulla, roughly spherical, 13 mm in diameter, extending into the fourth ventricle. Mild surrounding edema. There is upward extension via the aqueduct into the posterior third ventricle. Age advanced atrophy. Extensive chronic microvascular ischemic change throughout the white matter.No hydrocephalus. No extra-axial fluid collections. Vascular: Calcification of the cavernous internal carotid arteries consistent with cerebrovascular atherosclerotic disease. No signs of intracranial large vessel occlusion. Skull: Normal. Negative for fracture or focal lesion.  Sinuses/Orbits: No acute finding. Other: None. ASPECTS [facility] Stroke Program Early CT Score) Not applicable for intracranial hemorrhage. IMPRESSION: 1. Acute dorsal LEFT paramedian brainstem hemorrhage at the pontomedullary junction, extending into the fourth ventricle. Etiology is undetermined. Hypertension, vascular  malformation, anticoagulation, unrecognized trauma, or drug use are all considerations. 2. ASPECTS is not applicable. 3. Attempts are being made to contact the on-call neurologist. Electronically Signed   By: Staci Righter M.D.   On: 02/05/2018 07:13    Procedures Procedure Name: Intubation Date/Time: 02/05/2018 7:38 AM Performed by: Ezequiel Essex, MD Pre-anesthesia Checklist: Patient identified, Emergency Drugs available, Patient being monitored, Timeout performed and Suction available Oxygen Delivery Method: Non-rebreather mask Preoxygenation: Pre-oxygenation with 100% oxygen Induction Type: IV induction and Rapid sequence Ventilation: Mask ventilation without difficulty Laryngoscope Size: Mac, Glidescope and 4 Grade View: Grade I Tube size: 7.5 mm Number of attempts: 1 Airway Equipment and Method: Video-laryngoscopy and Lighted stylet Placement Confirmation: ETT inserted through vocal cords under direct vision,  Breath sounds checked- equal and bilateral and Positive ETCO2 Secured at: 25 cm Tube secured with: ETT holder Dental Injury: Teeth and Oropharynx as per pre-operative assessment  Future Recommendations: Recommend- induction with short-acting agent, and alternative techniques readily available      (including critical care time)  Medications Ordered in ED Medications  labetalol (NORMODYNE,TRANDATE) injection 20 mg (not administered)  labetalol (NORMODYNE,TRANDATE) injection 20 mg (not administered)    And  nicardipine (CARDENE) 18m in 0.86% saline 2032mIV infusion (0.1 mg/ml) (not administered)  propofol (DIPRIVAN) 1000 MG/100ML infusion (not  administered)  lidocaine (cardiac) 100 mg/52m44mXYLOCAINE) 20 MG/ML injection 2% (100 mg Intravenous Given 02/05/18 0702)  etomidate (AMIDATE) injection (20 mg Intravenous Given 02/05/18 0704)  rocuronium (ZEMURON) injection (100 mg Intravenous Given 02/05/18 0705)  iopamidol (ISOVUE-370) 76 % injection (not administered)     Initial Impression / Assessment and Plan / ED Course  I have reviewed the triage vital signs and the nursing notes.  Pertinent labs & imaging results that were available during my care of the patient were reviewed by me and considered in my medical decision making (see chart for details).    Code stroke from home with dysarthria, headache, fixed gaze, facial droop.  CT shows brainstem hemorrhage extending to the fourth ventricle.  Patient intubated for airway protection.  Seen at bedside with Dr. AroLorraine Laxf neurology.  Patient given IV labetalol and Cardene for blood pressure control.  Dr. CraSaintclair Halsted neurosurgery consulted. Patient to be admitted to ICU. No need for EVD at this time.   D/w Dr. GraPearline Cables Critical care. Wife updated. Patient to be admitted under neurology service to ICU.  CRITICAL CARE Performed by: RANEzequiel Essextal critical care time: 45 minutes Critical care time was exclusive of separately billable procedures and treating other patients. Critical care was necessary to treat or prevent imminent or life-threatening deterioration. Critical care was time spent personally by me on the following activities: development of treatment plan with patient and/or surrogate as well as nursing, discussions with consultants, evaluation of patient's response to treatment, examination of patient, obtaining history from patient or surrogate, ordering and performing treatments and interventions, ordering and review of laboratory studies, ordering and review of radiographic studies, pulse oximetry and re-evaluation of patient's condition.   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

## 2018-02-05 NOTE — ED Notes (Signed)
Blood collected

## 2018-02-05 NOTE — ED Triage Notes (Signed)
Per ems they were called out for stroke symptoms. Left sided weakness, diaphoretic,, double vision and headache.182/110. cbg,200

## 2018-02-05 NOTE — ED Notes (Signed)
Chaplain paged per the request of RN Chestine Spore

## 2018-02-05 NOTE — H&P (Addendum)
Chief Complaint: headache, double vision  History obtained from: Patient and Chart     HPI:                                                                                                                                       [name] is an 52 y.o. male past medical history of coronary  Artery disease, chronic heart failure, CKD, hypertension presents to [facility] emergency room as  Stroke alert.  Was last known normal last night 11 PM when he went to bed. He woke up this morning around 6:30 AM, complained of severe headache and double vision. When EMS arrived here forced gaze devaiton to right side and severe dysarthria. BP was >200 systolic. On arrival, stat CT head showed a pontine/midbrain hemorrhage with IVH in the 4th ventricle. Patient was severely dysarthric and intubated for airway protection. He received 40mg  of labetalol and started on Cardene drip. Neurosurgery was consulted for potential EVD placement in case he develops hydrocephalus.  Date last known well: 3.16.19 Time last known well: 11pm tPA Given: no, ICH  NIHSS: 8 Baseline MRS 0  Intracerebral Hemorrhage (ICH) Score  Glascow Coma Score 13-15 0  Age >/= 80 no 0  ICH volume no 0  IVH yes +1  Infratentorial origin yes +1 Total:  2   Past Medical History:  Diagnosis Date  . CAD (coronary artery disease) 02/18/2009   Small OM occlusion with collaterals, 60% RCA, 40% LAD. April 2015    . Cardiomyopathy, nonischemic (HCC) 05/03/2016  . CHF (congestive heart failure) (H[facility])   . CKD (chronic kidney disease)   . History of syncope 08/2008   Secondary to hypertension  . Hypertension, essential, benign    a. renal art [place] (6/14): no RAS  . Hypokalemia 05/03/2016  . Noncompliance   . Nonischemic cardiomyopathy ([facility])    EF 45%  . Obesity   . Sleep apnea   . Tobacco abuse     Past Surgical History:  Procedure Laterality Date  . CARDIAC CATHETERIZATION N/A 12/22/2015   Procedure: Left Heart Cath and  Coronary Angiography;  Surgeon: Lyn Records, MD;  Location: [facility] INVASIVE CV LAB;  Service: Cardiovascular;  Laterality: N/A;  . IR US GUIDE VASC ACCESS RIGHT  04/14/2017  . IR VENOGRAM ADRENAL BI  04/14/2017  . IR VENOGRAM RENAL BI  04/14/2017  . IR VENOUS SAMPLING  04/14/2017  . IR VENOUS SAMPLING  04/14/2017  . LEFT HEART CATHETERIZATION WITH CORONARY ANGIOGRAM N/A 02/21/2014   Procedure: LEFT HEART CATHETERIZATION WITH CORONARY ANGIOGRAM;  Surgeon: Kathleene Hazel, MD;  Location: [facility] CATH LAB;  Service: Cardiovascular;  Laterality: N/A;  . None      Family History  Problem Relation Age of Onset  . Lupus Mother   . Heart disease Maternal Grandfather   . Coronary artery disease Neg Hx   . Adrenal  disorder Neg Hx    Social History:  reports that he has quit smoking. His smoking use included cigarettes. He has a 3.20 pack-year smoking history. he has never used smokeless tobacco. He reports that he does not drink alcohol or use drugs.  Allergies:  Allergies  Allergen Reactions  . Penicillins Other (See Comments)    Makes his throat itch really bad, and feels like it is closing up.  Has patient had a PCN reaction causing immediate rash, facial/tongue/throat swelling, SOB or lightheadedness with hypotension: Yes Has patient had a PCN reaction causing severe rash involving mucus membranes or skin necrosis: No Has patient had a PCN reaction that required hospitalization: Yes Has patient had a PCN reaction occurring within the last 10 years: No If all of the above answers are "NO", then may proceed with Cephalospo  . Shrimp [Shellfish Allergy]     Throat swells    Medications:                                                                                                                        I reviewed home medications. On ASA 81mg   ROS:                                                                                                                                     14 systems  reviewed and negative except above    Examination:                                                                                                      General: Appears well-developed and well-nourished.  Psych: Affect appropriate to situation Eyes: No scleral injection HENT: No OP obstrucion Head: Normocephalic.  Cardiovascular: Normal rate and regular rhythm.  Respiratory: Effort normal and breath sounds normal to anterior ascultation GI: Soft.  No distension. There is no tenderness.  Skin: WDI   Neurological Examination Mental Status: Alert but later drowsy, oriented, followed commands. Severely dysarthric. Cranial Nerves: II:  Visual fields: less blink to threat on left side III,IV, VI: forced right side gaze deviaiton V,VII: left facial droop VIII: hearing normal bilaterally IX,X: uvula rises symmetrically XI: bilateral shoulder shrug XII: midline tongue extension Motor: Right : Upper extremity   4+/5               Left:     Upper extremity   4+/5  Lower extremity   4+/5     Lower extremity   4+/5 Tone and bulk:normal tone throughout; no atrophy noted Sensory: Pinprick and light touch intact throughout, bilaterally Deep Tendon Reflexes: 2+ and symmetric throughout Plantars: Right: downgoing   Left: downgoing Cerebellar: normal finger-to-nose Gait: unable to assess     Lab Results: Basic Metabolic Panel: Recent Labs  Lab 02/05/18 0649 02/05/18 0654  NA 137 140  K 3.8 3.8  CL 102 101  CO2 22  --   GLUCOSE 193* 199*  BUN 24* 26*  CREATININE 1.86* 1.90*  CALCIUM 9.0  --     CBC: Recent Labs  Lab 02/05/18 0649 02/05/18 0654  WBC 8.8  --   NEUTROABS 3.6  --   HGB 14.0 15.0  HCT 40.6 44.0  MCV 91.4  --   PLT 300  --     Coagulation Studies: Recent Labs    02/05/18 0649  LABPROT 12.4  INR 0.93    Imaging: Ct Head Code Stroke Wo Contrast  Result Date: 02/05/2018 CLINICAL DATA:  Code stroke.  Double vision, fixed gaze, headache EXAM: CT HEAD  WITHOUT CONTRAST TECHNIQUE: Contiguous axial images were obtained from the base of the skull through the vertex without intravenous contrast. COMPARISON:  MR brain 12/19/2015 FINDINGS: Brain: There is an acute dorsal LEFT paramedian hemorrhage affecting the lower pons and upper medulla, roughly spherical, 13 mm in diameter, extending into the fourth ventricle. Mild surrounding edema. There is upward extension via the aqueduct into the posterior third ventricle. Age advanced atrophy. Extensive chronic microvascular ischemic change throughout the white matter.No hydrocephalus. No extra-axial fluid collections. Vascular: Calcification of the cavernous internal carotid arteries consistent with cerebrovascular atherosclerotic disease. No signs of intracranial large vessel occlusion. Skull: Normal. Negative for fracture or focal lesion. Sinuses/Orbits: No acute finding. Other: None. ASPECTS [facility] Stroke Program Early CT Score) Not applicable for intracranial hemorrhage. IMPRESSION: 1. Acute dorsal LEFT paramedian brainstem hemorrhage at the pontomedullary junction, extending into the fourth ventricle. Etiology is undetermined. Hypertension, vascular malformation, anticoagulation, unrecognized trauma, or drug use are all considerations. 2. ASPECTS is not applicable. 3. Attempts are being made to contact the on-call neurologist. Electronically Signed   By: Elsie Stain M.D.   On: 02/05/2018 07:13     ASSESSMENT AND PLAN  [name] is an 52 y.o. male past medical history of coronary  Artery disease, chronic heart failure, CKD, hypertension.  Presents with severe headache, facial droop, severe dysarthria and forced gaze deviation to right- CT head showing pontine hemorrhage with 4th ventricle IVH. Intubated and NS consulted.   Acute dorsal LEFT paramedian brainstem hemorrhage at the pontomedullary junction, extending into the fourth ventricle.  Plan Admit to ICU Repeat CT head at 11am to look for  hydrocephalus No antiplatelets/AC MRI brain and MRA head ( to look for vascular malformation)  when stable INR/platelets wnl BP goal <140/90   HTN emergency BP goal <140/90 Nicardipine gtt started  AKI on CKD Cr is 1.9 Avoid nephrotoxic agents  CAD: hold Aspirin CHF: avoid excessive fluid repletion   Diet: will need  tube feeds DVT PPX; SCD    This patient is neurologically critically ill due to ICH with IVH.   He is at risk for significant risk of neurological worsening from cerebral edema,  death from brain herniation, heart failure,infection, respiratory failure and seizure. This patient's care requires constant monitoring of vital signs, hemodynamics, respiratory and cardiac monitoring, review of multiple databases, neurological assessment, discussion with family, other specialists and medical decision making of high complexity.  I spent 50  minutes of neurocritical time in the care of this patient.     Shaylene Paganelli [facility] Pager Number [phone]

## 2018-02-05 NOTE — Consult Note (Signed)
Reason for Consult: brainstem and intraventricular bleed Referring Physician: EDP  [name] is an 52 y.o. male.   HPI:  Patient presents to the ED with dysarthria and left sided weakness. On admission he was alert and moving all extremities. Was intubated in the ED for airway protection because of severe dysarthria. Currently intubated and still paralyzed from intubation meds.   Past Medical History:  Diagnosis Date  . CAD (coronary artery disease) 02/18/2009   Small OM occlusion with collaterals, 60% RCA, 40% LAD. April 2015    . Cardiomyopathy, nonischemic (HCC) 05/03/2016  . CHF (congestive heart failure) (HCC)   . CKD (chronic kidney disease)   . History of syncope 08/2008   Secondary to hypertension  . Hypertension, essential, benign    a. renal art [place] (6/14): no RAS  . Hypokalemia 05/03/2016  . Noncompliance   . Nonischemic cardiomyopathy (HCC)    EF 45%  . Obesity   . Sleep apnea   . Tobacco abuse     Past Surgical History:  Procedure Laterality Date  . CARDIAC CATHETERIZATION N/A 12/22/2015   Procedure: Left Heart Cath and Coronary Angiography;  Surgeon: Lyn Records, MD;  Location: [facility] INVASIVE CV LAB;  Service: Cardiovascular;  Laterality: N/A;  . IR US GUIDE VASC ACCESS RIGHT  04/14/2017  . IR VENOGRAM ADRENAL BI  04/14/2017  . IR VENOGRAM RENAL BI  04/14/2017  . IR VENOUS SAMPLING  04/14/2017  . IR VENOUS SAMPLING  04/14/2017  . LEFT HEART CATHETERIZATION WITH CORONARY ANGIOGRAM N/A 02/21/2014   Procedure: LEFT HEART CATHETERIZATION WITH CORONARY ANGIOGRAM;  Surgeon: Kathleene Hazel, MD;  Location: [facility] CATH LAB;  Service: Cardiovascular;  Laterality: N/A;  . None      Allergies  Allergen Reactions  . Penicillins Other (See Comments)    Makes his throat itch really bad, and feels like it is closing up.  Has patient had a PCN reaction causing immediate rash, facial/tongue/throat swelling, SOB or lightheadedness with hypotension: Yes Has patient had a PCN  reaction causing severe rash involving mucus membranes or skin necrosis: No Has patient had a PCN reaction that required hospitalization: Yes Has patient had a PCN reaction occurring within the last 10 years: No If all of the above answers are "NO", then may proceed with Cephalospo  . Shrimp [Shellfish Allergy]     Throat swells    Social History   Tobacco Use  . Smoking status: Former Smoker    Packs/day: 0.10    Years: 32.00    Pack years: 3.20    Types: Cigarettes  . Smokeless tobacco: Never Used  . Tobacco comment: Started smoking at age 33 (81).  Cutting back  Substance Use Topics  . Alcohol use: No    Alcohol/week: 0.0 oz    Family History  Problem Relation Age of Onset  . Lupus Mother   . Heart disease Maternal Grandfather   . Coronary artery disease Neg Hx   . Adrenal disorder Neg Hx      Review of Systems  Positive ROS: UTA  All other systems have been reviewed and were otherwise negative with the exception of those mentioned in the HPI and as above.  Objective: Vital signs in last 24 hours: Temp:  [96 F (35.6 C)] 96 F (35.6 C) (03/17 0705) Pulse Rate:  [86-94] 86 (03/17 0715) Resp:  [14-15] 15 (03/17 0715) BP: (173-204)/(99-125) 173/99 (03/17 0715) SpO2:  [98 %-100 %] 100 % (03/17 0724) FiO2 (%):  [100 %]  100 % (03/17 0715) Weight:  [109 kg (240 lb 4.8 oz)] 109 kg (240 lb 4.8 oz) (03/17 0700)  General Appearance: intubated and sedated Head: Normocephalic, without obvious abnormality, atraumatic Eyes: PERRL, conjunctiva/corneas clear, EOM's intact, fundi benign, both eyes      Neck: symmetrical, trachea midline Back: not tested Lungs: respirations unlabored, ETT Heart: Regular rate and rhythm Skin: Skin color, texture, turgor normal, no rashes or lesions  NEUROLOGIC:   Mental status: sedated and paralyzed Motor Exam - paralyzed from intubation still Sensory Exam - uta Reflexes: uta Coordination - not tested Gait -not tested Balance - not  tested Cranial Nerves: I: smell Not tested  II: visual acuity  OS: na    OD: na  II: visual fields uta  II: pupils Equal, round, reactive to light  III,VII: ptosis   III,IV,VI: extraocular muscles    V: mastication   V: facial light touch sensation    V,VII: corneal reflex    VII: facial muscle function - upper    VII: facial muscle function - lower   VIII: hearing   IX: soft palate elevation    IX,X: gag reflex   XI: trapezius strength    XI: sternocleidomastoid strength   XI: neck flexion strength    XII: tongue strength      Data Review Lab Results  Component Value Date   WBC 8.8 02/05/2018   HGB 15.0 02/05/2018   HCT 44.0 02/05/2018   MCV 91.4 02/05/2018   PLT 300 02/05/2018   Lab Results  Component Value Date   NA 140 02/05/2018   K 3.8 02/05/2018   CL 101 02/05/2018   CO2 25 12/24/2017   BUN 26 (H) 02/05/2018   CREATININE 1.90 (H) 02/05/2018   GLUCOSE 199 (H) 02/05/2018   Lab Results  Component Value Date   INR 0.93 02/05/2018    Radiology: Ct Head Code Stroke Wo Contrast  Result Date: 02/05/2018 CLINICAL DATA:  Code stroke.  Double vision, fixed gaze, headache EXAM: CT HEAD WITHOUT CONTRAST TECHNIQUE: Contiguous axial images were obtained from the base of the skull through the vertex without intravenous contrast. COMPARISON:  MR brain 12/19/2015 FINDINGS: Brain: There is an acute dorsal LEFT paramedian hemorrhage affecting the lower pons and upper medulla, roughly spherical, 13 mm in diameter, extending into the fourth ventricle. Mild surrounding edema. There is upward extension via the aqueduct into the posterior third ventricle. Age advanced atrophy. Extensive chronic microvascular ischemic change throughout the white matter.No hydrocephalus. No extra-axial fluid collections. Vascular: Calcification of the cavernous internal carotid arteries consistent with cerebrovascular atherosclerotic disease. No signs of intracranial large vessel occlusion. Skull: Normal.  Negative for fracture or focal lesion. Sinuses/Orbits: No acute finding. Other: None. ASPECTS [facility] Stroke Program Early CT Score) Not applicable for intracranial hemorrhage. IMPRESSION: 1. Acute dorsal LEFT paramedian brainstem hemorrhage at the pontomedullary junction, extending into the fourth ventricle. Etiology is undetermined. Hypertension, vascular malformation, anticoagulation, unrecognized trauma, or drug use are all considerations. 2. ASPECTS is not applicable. 3. Attempts are being made to contact the on-call neurologist. Electronically Signed   By: Elsie Stain M.D.   On: 02/05/2018 07:13    Assessment/Plan: Code stroke came in with some dysarthria and left sided weakness. Intubated for airway protection. Currently sedated and paralyzed from RSI. Agree with report: brainstem hemorrhage with some extension into the 4th ventricle but no obstructive hydrocephalus. Will repeat his CT scan in a couple hours to monitor potential hydro. No EVD needed at this time. Will  continue to monitor   [facility] 02/05/2018 7:34 AM

## 2018-02-05 NOTE — Progress Notes (Signed)
Pt transported to MRI from the ED via vent. Pt switched to MRI vent on current settings. No complications noted.

## 2018-02-05 NOTE — Progress Notes (Signed)
   02/05/18 0700  Clinical Encounter Type  Visited With Patient and family together  Visit Type Initial  Referral From Nurse  Consult/Referral To Chaplain  Spiritual Encounters  Spiritual Needs Emotional  Stress Factors  Patient Stress Factors Exhausted  Family Stress Factors Exhausted    Pt was intubated on arrival. Pt's girlfriend on-site. No other family members but pt was not willing for any other person to be called. I provided emotional support and compassionate presence.  Cecia Egge a Water quality scientist, [employer]

## 2018-02-05 NOTE — Progress Notes (Signed)
MD page regarding patients blood pressure out of parameters, despite being maxed out on Cardene. Awaiting call back. Will continue to monitor. Dicie Beam RN BSN

## 2018-02-05 NOTE — Progress Notes (Signed)
MRI completed and images reviewed. I agree with the official Radiology report conclusions:  Acute LEFT paramedian pontomedullary hemorrhage, roughly similar in size to earlier CT, 14 x 15 x 21 mm corresponding to an approximate volume of 2-3 mL.  Given the numerous microbleeds elsewhere in the brain, as well as markedly elevated blood pressure on presentation, this abnormality is felt to most likely represent a hypertensive related hemorrhage.  Premature atrophy with chronic microvascular ischemic change.  There is mass effect on the fourth ventricle and aqueduct, and hemorrhage has extended into the adjacent ventricular system. The patient is at risk for development of hydrocephalus.  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 Internal carotid arteries are widely patent. No visible saccular aneurysm or vascular malformation.  Electronically signed: Dr. Caryl Pina

## 2018-02-05 NOTE — Code Documentation (Signed)
Dr. Manus Gunning intubated :

## 2018-02-05 NOTE — Progress Notes (Signed)
STROKE TEAM PROGRESS NOTE   HISTORY OF PRESENT ILLNESS (per record) [name] is an 52 y.o. male past medical history of coronary  Artery disease, chronic heart failure, CKD, hypertension presents to [facility] emergency room as  Stroke alert.  Was last known normal last night 11 PM when he went to bed. He woke up this morning around 6:30 AM, complained of severe headache and double vision. When EMS arrived here forced gaze devaiton to right side and severe dysarthria. BP was >200 systolic. On arrival, stat CT head showed a pontine/midbrain hemorrhage with IVH in the 4th ventricle. Patient was severely dysarthric and intubated for airway protection. He received 40mg  of labetalol and started on Cardene drip. Neurosurgery was consulted for potential EVD placement in case he develops hydrocephalus.  Date last known well: 3.16.19 Time last known well: 11pm tPA Given: no, ICH  NIHSS: 8 Baseline MRS 0    SUBJECTIVE (INTERVAL HISTORY) His aunt and brother are  at the bedside.   He remains intubated and sedated but can be easily aroused and follows commands.  Blood pressure is adequately controlled.   OBJECTIVE Temp:  [96 F (35.6 C)] 96 F (35.6 C) (03/17 0705) Pulse Rate:  [84-98] 89 (03/17 0830) Resp:  [14-26] 26 (03/17 0830) BP: (126-204)/(75-125) 126/75 (03/17 0830) SpO2:  [96 %-100 %] 96 % (03/17 0910) FiO2 (%):  [100 %] 100 % (03/17 0910) Weight:  [240 lb 4.8 oz (109 kg)] 240 lb 4.8 oz (109 kg) (03/17 0700)  CBC:  Recent Labs  Lab 02/05/18 0649 02/05/18 0654  WBC 8.8  --   NEUTROABS 3.6  --   HGB 14.0 15.0  HCT 40.6 44.0  MCV 91.4  --   PLT 300  --     Basic Metabolic Panel:  Recent Labs  Lab 02/05/18 0649 02/05/18 0654  NA 137 140  K 3.8 3.8  CL 102 101  CO2 22  --   GLUCOSE 193* 199*  BUN 24* 26*  CREATININE 1.86* 1.90*  CALCIUM 9.0  --     Lipid Panel:     Component Value Date/Time   CHOL 277 (H) 12/21/2017 1819   TRIG 202 (H) 12/21/2017 1819   HDL 53 12/21/2017 1819   CHOLHDL 5.2 12/21/2017 1819   VLDL 40 12/21/2017 1819   LDLCALC 184 (H) 12/21/2017 1819   HgbA1c:  Lab Results  Component Value Date   HGBA1C 6.3 (H) 12/21/2017   Urine Drug Screen:     Component Value Date/Time   LABOPIA NONE DETECTED 12/15/2015 1012   COCAINSCRNUR See Final Results 12/15/2015 1012   COCAINSCRNUR POSITIVE (A) 12/15/2015 1012   LABBENZ NONE DETECTED 12/15/2015 1012   AMPHETMU NONE DETECTED 12/15/2015 1012   THCU NONE DETECTED 12/15/2015 1012   LABBARB Negative 12/15/2015 1012   LABBARB NONE DETECTED 12/15/2015 1012    Alcohol Level No results found for: [facility]  IMAGING   Dg Chest Portable 1 View 02/05/2018 IMPRESSION:  1. Endotracheal tube is low at 1 cm from carina. Consider retraction by 2-3 cm.  2. NG tube in stomach  3. Low lung volumes    Ct Head Code Stroke Wo Contrast  02/05/2018 IMPRESSION:  1. Acute dorsal LEFT paramedian brainstem hemorrhage at the pontomedullary junction, extending into the fourth ventricle. Etiology is undetermined. Hypertension, vascular malformation, anticoagulation, unrecognized trauma, or drug use are all considerations.  2. ASPECTS is not applicable.  3. Attempts are being made to contact the on-call neurologist.  MRI / MRA Head  02/05/2018 IMPRESSION: Acute LEFT paramedian pontomedullary hemorrhage, roughly similar in size to earlier  CT, 14 x 15 x 21 mm corresponding to an approximate volume of 2-3 mL.  Given the numerous microbleeds elsewhere in the brain, as well as markedly elevated blood pressure on presentation, this abnormality is felt to most likely represent a hypertensive related hemorrhage.  Premature atrophy with chronic microvascular ischemic change.  There is mass effect on the fourth ventricle and aqueduct, and hemorrhage has extended into the adjacent ventricular system. The patient is at risk for development of hydrocephalus.  75% stenosis of the distal LEFT  vertebral artery, which is the sole contributor to the basilar. Internal carotid arteries are widely patent. No visible saccular aneurysm or vascular malformation.   Transthoracic Echocardiogram - pending 07/18/2017   Bilateral Carotid Dopplers - pending 00/00/00      PHYSICAL EXAM Vitals:   02/05/18 0810 02/05/18 0815 02/05/18 0830 02/05/18 0910  BP:  (!) 159/90 126/75   Pulse:  98 89   Resp:  (!) 26 (!) 26   Temp:      TempSrc:      SpO2:  100% 100% 96%  Weight:      Height: 5\' 9"  (1.753 m)      Pleasant middle-aged African-American gentleman who is sedated and intubated. . Afebrile. Head is nontraumatic. Neck is supple without bruit.    Cardiac exam no murmur or gallop. Lungs are clear to auscultation. Distal pulses are well felt. Neurological Exam :  Sedated and intubated.  Drowsy but opens eyes and follows commands.  Right gaze preference.  Left gaze palsy.  Nystagmus when he looks to the right.  Right one half syndrome.  Skew eye deviation with left eye deviated downwards.  Pupils 3 mm equal reactive.  Fundi not visualized.  Blinks to threat more on the right than the left.  Face is symmetric.  Tongue midline.  Has a good cough and gag.  Motor system exam patient is able to move all 4 extremities against gravity but moves left side more than right.  Deep tendon reflexes are 2+ symmetric.  Plantars are downgoing.  Sensation appears preserved bilaterally.  Gait not tested.  ASSESSMENT/PLAN [name] is a 52 y.o. male with history of coronary artery disease, cardiomyopathy, congestive heart failure, chronic kidney disease, hypertension, medical noncompliance, obesity, obstructive sleep apnea, previous tobacco use, and previous cocaine use presenting with a severe headache, diplopia, gaze deviation, elevated blood pressure, and dysarthria. He did not receive IV t-PA due to hemorrhage.  Acute LEFT paramedian pontomedullary hemorrhage -likely secondary to hypertension  versus cavernoma.  Resultant skew eye deviation, left gaze palsy, diplopia  CT head - Acute dorsal Lt paramedian brainstem hemorrhage at the pontomedullary junction.  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  Carotid Doppler - pending  2D Echo - pending  UDS - pending  LDL - pending  HgbA1c - pending  VTE prophylaxis - SCDs Diet NPO time specified Fall precautions  aspirin 81 mg daily prior to admission, now on No antithrombotic  Ongoing aggressive stroke risk factor management  Therapy recommendations:  pending  Disposition:  Pending  Hypertension  BP somewhat high at times.  Systolic blood pressure goal of less than 140 for the first 24 hours.  Long-term BP goal normotensive  Hyperlipidemia  Home meds: Lipitor  80 mg daily prior to admission now on hold secondary to hemorrhage.  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  Coronary artery disease /cardiomyopathy  History of drug use - UDS pending  CKD - BUN 26 ; creatinine 1.90  Hyperglycemia -hemoglobin A1c pending  At risk for development of hydrocephalus - monitor - may need hypertonic saline.  Intubated   Plan / Recommendations   Continue strict blood pressure control with systolic goal below 140 for the first 24 hours and then below 180 after that.  Repeat CT scan of the head tomorrow morning.  Patient could be extubated over the next few days if he is able to protect his airway.  Discussed with patient and family members at the bedside  and answered questions.  Discussed with Dr. Warren Lacy pulmonary critical care medicine. This patient is critically ill and at significant risk of neurological worsening, death and care requires constant monitoring of vital signs, hemodynamics,respiratory and cardiac monitoring, extensive review of multiple databases, frequent neurological assessment, discussion with family, other specialists and medical decision making of high complexity.I have made any additions or clarifications directly to the above note.This critical care time does not reflect procedure time, or teaching time or supervisory time of PA/NP/Med Resident etc but could involve care discussion time.  I spent 35  minutes of neurocritical care time  in the care of  this patient.  Delia Heady, MD Medical Director [facility] Pager: [phone]  02/05/2018 3:41 PM      Hospital day # 0     To contact Stroke Continuity provider, please refer to [email or web address]. After hours, contact General Neurology

## 2018-02-05 NOTE — Consult Note (Signed)
PULMONARY / CRITICAL CARE MEDICINE   Name: [name] MRN: [id number] DOB: 1966/03/26    ADMISSION DATE:  02/05/2018 CONSULTATION DATE: 02/05/2018  REFERRING MD: Neurology  CHIEF COMPLAINT: Headache, diplopia, and severe dysarthria.  HISTORY OF PRESENT ILLNESS:        This is a 52 year old with a past medical history of nonischemic cardiomyopathy, congestive heart failure, and hypertension who on previous visits to the emergency room his screen positive for cocaine.  He presented with extreme hypertension headache, diplopia, and severe dysarthria, so severe that he required intubation for maintenance of his airway.  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  A repeat CT scan has not shown progression of hydrocephalus or of the bleed.  He arrives in the intensive care unit on a Cardene infusion and orally intubated.  He is nodding to questions for me.   PAST MEDICAL HISTORY :  He  has a past medical history of CAD (coronary artery disease) (02/18/2009), Cardiomyopathy, nonischemic (HCC) (05/03/2016), CHF (congestive heart failure) (HCC), CKD (chronic kidney disease), History of syncope (08/2008), Hypertension, essential, benign, Hypokalemia (05/03/2016), Noncompliance, Nonischemic cardiomyopathy (HCC), Obesity, Sleep apnea, and Tobacco abuse.  PAST SURGICAL HISTORY: He  has a past surgical history that includes None; left heart catheterization with coronary angiogram (N/A, 02/21/2014); Cardiac catheterization (N/A, 12/22/2015); IR VenoUS Sampling (04/14/2017); IR US Guide Vasc Access Right (04/14/2017); IR Venogram Renal Bi (04/14/2017); IR VenoUS Sampling (04/14/2017); and IR Venogram Adrenal Bi (04/14/2017).  Allergies  Allergen Reactions  . Penicillins Other (See Comments)    Makes his throat itch really bad, and feels like it is closing up.  Has patient had a PCN reaction causing immediate rash, facial/tongue/throat swelling, SOB  or lightheadedness with hypotension: Yes Has patient had a PCN reaction causing severe rash involving mucus membranes or skin necrosis: No Has patient had a PCN reaction that required hospitalization: Yes Has patient had a PCN reaction occurring within the last 10 years: No If all of the above answers are "NO", then may proceed with Cephalospo  . Shrimp [Shellfish Allergy]     Throat swells    No current facility-administered medications on file prior to encounter.    Current Outpatient Medications on File Prior to Encounter  Medication Sig  . acetaminophen-codeine (TYLENOL #3) 300-30 MG tablet Take 1 tablet by mouth every 6 (six) hours as needed for moderate pain.  Marland Kitchen amLODipine (NORVASC) 10 MG tablet Take 1 tablet (10 mg total) by mouth daily.  Marland Kitchen aspirin EC 81 MG tablet Take 1 tablet (81 mg total) by mouth daily.  Marland Kitchen atorvastatin (LIPITOR) 80 MG tablet Take 1 tablet (80 mg total) by mouth daily at 6 PM.  . furosemide (LASIX) 40 MG tablet Take 1 tablet (40 mg total) by mouth daily.  . hydrALAZINE (APRESOLINE) 100 MG tablet Take 1 tablet (100 mg total) by mouth 3 (three) times daily.  Marland Kitchen HYDROcodone-acetaminophen (NORCO/VICODIN) 5-325 MG tablet Take 2 tablets by mouth every 6 (six) hours as needed. (Patient taking differently: Take 2 tablets by mouth every 6 (six) hours as needed for moderate pain. )  . isosorbide mononitrate (IMDUR) 60 MG 24 hr tablet Take 1 tablet (60 mg total) by mouth daily.  Marland Kitchen labetalol (NORMODYNE) 200 MG tablet Take 2 tablets (400 mg total) by mouth 2 (two) times daily.  . nitroGLYCERIN (NITROSTAT) 0.4 MG SL tablet Place 1 tablet (0.4 mg total) under the tongue every 5 (five) minutes  as needed. For chest pain  . potassium chloride SA (K-DUR,KLOR-CON) 20 MEQ tablet Take 2 tablets (40 mEq total) by mouth daily.  Marland Kitchen spironolactone (ALDACTONE) 50 MG tablet Take 1 tablet (50 mg total) by mouth daily.    FAMILY HISTORY:  His indicated that his mother is deceased. He indicated  that his father is deceased. He indicated that his maternal grandmother is deceased. He indicated that his maternal grandfather is deceased. He indicated that his paternal grandmother is deceased. He indicated that his paternal grandfather is deceased. He indicated that the status of his neg hx is unknown.   SOCIAL HISTORY: He  reports that he has quit smoking. His smoking use included cigarettes. He has a 3.20 pack-year smoking history. he has never used smokeless tobacco. He reports that he does not drink alcohol or use drugs.  REVIEW OF SYSTEMS:   Not obtainable  SUBJECTIVE:  As above  VITAL SIGNS: BP 129/77 (BP Location: Left Arm)   Pulse 86   Temp (!) 96.3 F (35.7 C) (Axillary)   Resp (!) 27   Ht 5\' 9"  (1.753 m)   Wt 240 lb 4.8 oz (109 kg)   SpO2 100%   BMI 35.49 kg/m   HEMODYNAMICS:    VENTILATOR SETTINGS: Vent Mode: PRVC FiO2 (%):  [60 %-100 %] 60 % Set Rate:  [15 bmp-20 bmp] 20 bmp Vt Set:  [530 mL-550 mL] 530 mL PEEP:  [5 cmH20] 5 cmH20 Plateau Pressure:  [25 cmH20-27 cmH20] 25 cmH20  INTAKE / OUTPUT: No intake/output data recorded.  PHYSICAL EXAMINATION: General: This is an obese middle-aged male who is orally intubated and mechanically ventilated and not in overt distress Neuro: He is easy to awaken and nods to questions.  He is not moving his extremities on request for me.  Gaze is disconjugate and pupils are equal Cardiovascular: S1 and S2 are regular with a soft S4.  There are no murmurs.  He does not have dependent edema in the periphery is warm Lungs: Respirations are unlabored, he is not breathing above the set ventilator rate.  There is symmetric air movement, no wheezes. Abdomen: The abdomen is obese soft and nontender without any overt organomegaly. He is anicteric   LABS:  BMET Recent Labs  Lab 02/05/18 0649 02/05/18 0654  NA 137 140  K 3.8 3.8  CL 102 101  CO2 22  --   BUN 24* 26*  CREATININE 1.86* 1.90*  GLUCOSE 193* 199*     Electrolytes Recent Labs  Lab 02/05/18 0649  CALCIUM 9.0    CBC Recent Labs  Lab 02/05/18 0649 02/05/18 0654  WBC 8.8  --   HGB 14.0 15.0  HCT 40.6 44.0  PLT 300  --     Coag's Recent Labs  Lab 02/05/18 0649  APTT 28  INR 0.93    Sepsis Markers No results for input(s): LATICACIDVEN, PROCALCITON, O2SATVEN in the last 168 hours.  ABG Recent Labs  Lab 02/05/18 0836  PHART 7.290*  PCO2ART 68.1*  PO2ART 120.0*    Liver Enzymes Recent Labs  Lab 02/05/18 0649  AST 21  ALT 34  ALKPHOS 67  BILITOT 0.7  ALBUMIN 3.9    Cardiac Enzymes No results for input(s): TROPONINI, PROBNP in the last 168 hours.  Glucose Recent Labs  Lab 02/05/18 0649  GLUCAP 200*    Imaging Mr Maxine Glenn Head Wo Contrast  Result Date: 02/05/2018 CLINICAL DATA:  Severe headache and double vision, began earlier today. Blood pressure greater than  200 systolic. Severe dysarthria. EXAM: MRI HEAD WITHOUT CONTRAST MRA HEAD WITHOUT CONTRAST TECHNIQUE: Multiplanar, multiecho pulse sequences of the brain and surrounding structures were obtained without intravenous contrast. Angiographic images of the head were obtained using MRA technique without contrast. COMPARISON:  CT head earlier today. FINDINGS: MRI HEAD FINDINGS Brain: There is an acute dorsal LEFT paramedian brainstem infarct, centered at the pontomedullary junction. Cross-sectional measurements of the parenchymal hemorrhage are 14 x 15 x 21 mm, corresponding to a volume of 2-3 mL, although it is difficult to quantify the additional blood which has extended into the fourth ventricle, aqueduct, and posterior third ventricle. There is mild surrounding brainstem edema. There is obscuration of the CSF within the fourth ventricle, with mass effect on the distal aqueduct, and the patient is at risk for obstructive hydrocephalus. Age advanced atrophy. Premature for age moderate T2 and FLAIR hyperintensities throughout the brain, likely chronic  microvascular ischemic change due to hypertension. Multiple foci of hemosiderin are seen in the RIGHT cerebral hemisphere, RIGHT thalamus, midbrain and pons, as well as RIGHT cerebellum, consistent with old hypertensive related bleeds. Vascular: Described below. Skull and upper cervical spine: Normal marrow signal. Sinuses/Orbits: Chronic sinus disease.  Negative orbits. Other: None. MRA HEAD FINDINGS The internal carotid arteries are markedly dolichoectatic but widely patent. 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 There is a 75% stenosis of the distal LEFT vertebral artery in its V4 segment. The RIGHT vertebral appears to contribute only to PICA. No visible cerebellar branch occlusion. There is no visible vascular malformation or saccular aneurysm. IMPRESSION: Acute LEFT paramedian pontomedullary hemorrhage, roughly similar in size to earlier CT, 14 x 15 x 21 mm corresponding to an approximate volume of 2-3 mL. Given the numerous microbleeds elsewhere in the brain, as well as markedly elevated blood pressure on presentation, this abnormality is felt to most likely represent a hypertensive related hemorrhage. Premature atrophy with chronic microvascular ischemic change. There is mass effect on the fourth ventricle and aqueduct, and hemorrhage has extended into the adjacent ventricular system. The patient is at risk for development of hydrocephalus. 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 Internal carotid arteries are widely patent. No visible saccular aneurysm or vascular malformation. Electronically Signed   By: Elsie Stain M.D.   On: 02/05/2018 10:42   Mr Brain Wo Contrast  Result Date: 02/05/2018 CLINICAL DATA:  Severe headache and double vision, began earlier today. Blood pressure greater than 200 systolic. Severe dysarthria. EXAM: MRI HEAD WITHOUT CONTRAST MRA HEAD WITHOUT  CONTRAST TECHNIQUE: Multiplanar, multiecho pulse sequences of the brain and surrounding structures were obtained without intravenous contrast. Angiographic images of the head were obtained using MRA technique without contrast. COMPARISON:  CT head earlier today. FINDINGS: MRI HEAD FINDINGS Brain: There is an acute dorsal LEFT paramedian brainstem infarct, centered at the pontomedullary junction. Cross-sectional measurements of the parenchymal hemorrhage are 14 x 15 x 21 mm, corresponding to a volume of 2-3 mL, although it is difficult to quantify the additional blood which has extended into the fourth ventricle, aqueduct, and posterior third ventricle. There is mild surrounding brainstem edema. There is obscuration of the CSF within the fourth ventricle, with mass effect on the distal aqueduct, and the patient is at risk for obstructive hydrocephalus. Age advanced atrophy. Premature for age moderate T2 and FLAIR hyperintensities throughout the brain, likely chronic microvascular ischemic change due to hypertension. Multiple  foci of hemosiderin are seen in the RIGHT cerebral hemisphere, RIGHT thalamus, midbrain and pons, as well as RIGHT cerebellum, consistent with old hypertensive related bleeds. Vascular: Described below. Skull and upper cervical spine: Normal marrow signal. Sinuses/Orbits: Chronic sinus disease.  Negative orbits. Other: None. MRA HEAD FINDINGS The internal carotid arteries are markedly dolichoectatic but widely patent. 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 There is a 75% stenosis of the distal LEFT vertebral artery in its V4 segment. The RIGHT vertebral appears to contribute only to PICA. No visible cerebellar branch occlusion. There is no visible vascular malformation or saccular aneurysm. IMPRESSION: Acute LEFT paramedian pontomedullary hemorrhage, roughly similar in size to earlier CT, 14 x 15 x 21 mm corresponding to an  approximate volume of 2-3 mL. Given the numerous microbleeds elsewhere in the brain, as well as markedly elevated blood pressure on presentation, this abnormality is felt to most likely represent a hypertensive related hemorrhage. Premature atrophy with chronic microvascular ischemic change. There is mass effect on the fourth ventricle and aqueduct, and hemorrhage has extended into the adjacent ventricular system. The patient is at risk for development of hydrocephalus. 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 Internal carotid arteries are widely patent. No visible saccular aneurysm or vascular malformation. Electronically Signed   By: Elsie Stain M.D.   On: 02/05/2018 10:42   Dg Chest Portable 1 View  Result Date: 02/05/2018 CLINICAL DATA:  Stroke. EXAM: PORTABLE CHEST 1 VIEW COMPARISON:  12/21/2017 FINDINGS: Endotracheal tube is 1 cm from carina. Stable enlarged cardiac silhouette. No effusion, infiltrate pneumothorax. Low lung volumes. NG tube extends the stomach. IMPRESSION: 1. Endotracheal tube is low at 1 cm from carina. Consider retraction by 2-3 cm. 2. NG tube in stomach 3. Low lung volumes Electronically Signed   By: Genevive Bi M.D.   On: 02/05/2018 08:33   Ct Head Code Stroke Wo Contrast  Result Date: 02/05/2018 CLINICAL DATA:  Code stroke.  Double vision, fixed gaze, headache EXAM: CT HEAD WITHOUT CONTRAST TECHNIQUE: Contiguous axial images were obtained from the base of the skull through the vertex without intravenous contrast. COMPARISON:  MR brain 12/19/2015 FINDINGS: Brain: There is an acute dorsal LEFT paramedian hemorrhage affecting the lower pons and upper medulla, roughly spherical, 13 mm in diameter, extending into the fourth ventricle. Mild surrounding edema. There is upward extension via the aqueduct into the posterior third ventricle. Age advanced atrophy. Extensive chronic microvascular ischemic change throughout the white  matter.No hydrocephalus. No extra-axial fluid collections. Vascular: Calcification of the cavernous internal carotid arteries consistent with cerebrovascular atherosclerotic disease. No signs of intracranial large vessel occlusion. Skull: Normal. Negative for fracture or focal lesion. Sinuses/Orbits: No acute finding. Other: None. ASPECTS [facility] Stroke Program Early CT Score) Not applicable for intracranial hemorrhage. IMPRESSION: 1. Acute dorsal LEFT paramedian brainstem hemorrhage at the pontomedullary junction, extending into the fourth ventricle. Etiology is undetermined. Hypertension, vascular malformation, anticoagulation, unrecognized trauma, or drug use are all considerations. 2. ASPECTS is not applicable. 3. Attempts are being made to contact the on-call neurologist. Electronically Signed   By: Elsie Stain M.D.   On: 02/05/2018 07:13    DISCUSSION:      This is a 52 year old who is suffered from a hypertensive brainstem hemorrhage was required intubation for airway protection.  He has a history of nonischemic cardiomyopathy and congestive heart failure and has screened positive for cocaine  on previous ER visits  ASSESSMENT / PLAN:  PULMONARY A: I have no plan to wean the patient at present and in fact will be correcting his ventilator settings to produce a PCO2 more appropriate for patient who is potentially suffering from intracranial hypertension.   CARDIOVASCULAR A: In the acute phase his hypertension will be controlled with Cardene, I would like to add an ACE inhibitor as his renal function allows out of respect for his LV systolic function.  A low-dose of beta-blocker will also be appropriate once he enters the chronic phase of management.   RENAL A: I am aware of his chronic renal insufficiency with a creatinine of 1.9 today  GASTROINTESTINAL A: GI prophylaxis will be with Protonix   HEMATOLOGIC A: DVT prophylaxis will be with SCDs alone out of respect for his intracranial  hemorrhage   ENDOCRINE A:   He is hyperglycemic and a sliding scale dose of insulin has been ordered.  NEUROLOGIC A: As noted above this patient is at risk for the development of hydrocephalus.  Is going to be observed for 24 hours before contemplating extubation.    Greater than 32 minutes has been spent in the care of this patient today who is requiring mechanical ventilatory support and acute interventions for blood pressure management   Penny Pia, MD Pulmonary and Critical Care Medicine [facility] Pager: [phone]  02/05/2018, 12:25 PM

## 2018-02-06 ENCOUNTER — Inpatient Hospital Stay (HOSPITAL_COMMUNITY): Payer: Medicaid Other

## 2018-02-06 DIAGNOSIS — I5030 Unspecified diastolic (congestive) heart failure: Secondary | ICD-10-CM

## 2018-02-06 DIAGNOSIS — I613 Nontraumatic intracerebral hemorrhage in brain stem: Secondary | ICD-10-CM

## 2018-02-06 LAB — BASIC METABOLIC PANEL
Anion gap: 10 (ref 5–15)
BUN: 23 mg/dL — ABNORMAL HIGH (ref 6–20)
CHLORIDE: 105 mmol/L (ref 101–111)
CO2: 24 mmol/L (ref 22–32)
Calcium: 8.4 mg/dL — ABNORMAL LOW (ref 8.9–10.3)
Creatinine, Ser: 1.97 mg/dL — ABNORMAL HIGH (ref 0.61–1.24)
GFR, EST AFRICAN AMERICAN: 43 mL/min — AB (ref >60)
GFR, EST NON AFRICAN AMERICAN: 37 mL/min — AB (ref >60)
Glucose, Bld: 135 mg/dL — ABNORMAL HIGH (ref 65–99)
POTASSIUM: 3.6 mmol/L (ref 3.5–5.1)
SODIUM: 139 mmol/L (ref 135–145)

## 2018-02-06 LAB — LIPID PANEL
CHOL/HDL RATIO: 4.2 ratio
CHOLESTEROL: 123 mg/dL (ref 0–200)
HDL: 29 mg/dL — ABNORMAL LOW (ref >40)
LDL CALC: 46 mg/dL (ref 0–99)
Triglycerides: 238 mg/dL — ABNORMAL HIGH (ref <150)
VLDL: 48 mg/dL — ABNORMAL HIGH (ref 0–40)

## 2018-02-06 LAB — GLUCOSE, CAPILLARY
GLUCOSE-CAPILLARY: 117 mg/dL — AB (ref 65–99)
GLUCOSE-CAPILLARY: 120 mg/dL — AB (ref 65–99)
Glucose-Capillary: 119 mg/dL — ABNORMAL HIGH (ref 65–99)
Glucose-Capillary: 109 mg/dL — ABNORMAL HIGH (ref 65–99)
Glucose-Capillary: 105 mg/dL — ABNORMAL HIGH (ref 65–99)

## 2018-02-06 LAB — ECHOCARDIOGRAM COMPLETE
Height: 69 in
WEIGHTICAEL: 3844.82 [oz_av]

## 2018-02-06 LAB — HEMOGLOBIN A1C
Hgb A1c MFr Bld: 6.1 % — ABNORMAL HIGH (ref 4.8–5.6)
MEAN PLASMA GLUCOSE: 128.37 mg/dL

## 2018-02-06 MED ORDER — PERFLUTREN LIPID MICROSPHERE
INTRAVENOUS | Status: AC
  Administered 2018-02-06: 2 mL via INTRAVENOUS
  Filled 2018-02-06: qty 10

## 2018-02-06 MED ORDER — PRO-STAT SUGAR FREE PO LIQD
60.0000 mL | Freq: Four times a day (QID) | ORAL | Status: DC
  Administered 2018-02-06 – 2018-02-08 (×7): 60 mL
  Filled 2018-02-06 (×7): qty 60

## 2018-02-06 MED ORDER — PERFLUTREN LIPID MICROSPHERE
1.0000 mL | INTRAVENOUS | Status: AC | PRN
  Administered 2018-02-06: 2 mL via INTRAVENOUS
  Filled 2018-02-06: qty 10

## 2018-02-06 MED ORDER — ADULT MULTIVITAMIN LIQUID CH
15.0000 mL | Freq: Every day | ORAL | Status: DC
  Administered 2018-02-06 – 2018-02-08 (×3): 15 mL
  Filled 2018-02-06 (×3): qty 15

## 2018-02-06 MED ORDER — VITAL HIGH PROTEIN PO LIQD
1000.0000 mL | ORAL | Status: DC
  Administered 2018-02-06 – 2018-02-07 (×2): 1000 mL
  Filled 2018-02-06 (×2): qty 1000

## 2018-02-06 MED ORDER — NICARDIPINE HCL IN NACL 40-0.83 MG/200ML-% IV SOLN
0.0000 mg/h | INTRAVENOUS | Status: DC
  Administered 2018-02-07: 5 mg/h via INTRAVENOUS
  Filled 2018-02-06: qty 200

## 2018-02-06 NOTE — Progress Notes (Signed)
PULMONARY / CRITICAL CARE MEDICINE   Name: [name] MRN: [id number] DOB: 1966-07-06    ADMISSION DATE:  02/05/2018  CHIEF COMPLAINT:  Diplopia and dysarthria  HISTORY OF PRESENT ILLNESS:        This is a 52 year old with a past medical history of nonischemic cardiomyopathy, congestive heart failure, and hypertension who on previous visits to the emergency room his screen positive for cocaine.  He presented with extreme hypertension headache, diplopia, and severe dysarthria, so severe that he required intubation for maintenance of his airway.  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  A repeat CT scan has not shown progression of hydrocephalus or of the bleed.    He is very alert and nodding to questions today.  He indicates that he has diplopia but is not having any nausea.  He was switched to a pressure support of 10 prior to my visit and he is having some marginal tidal volumes denies dyspnea.  Repeat CT scan this morning shows no progression of his pontomedullary hemorrhage, his urine drug screen was positive for cocaine    PAST MEDICAL HISTORY :  He  has a past medical history of CAD (coronary artery disease) (02/18/2009), Cardiomyopathy, nonischemic (HCC) (05/03/2016), CHF (congestive heart failure) (HCC), CKD (chronic kidney disease), History of syncope (08/2008), Hypertension, essential, benign, Hypokalemia (05/03/2016), Noncompliance, Nonischemic cardiomyopathy (HCC), Obesity, Sleep apnea, and Tobacco abuse.  PAST SURGICAL HISTORY: He  has a past surgical history that includes None; left heart catheterization with coronary angiogram (N/A, 02/21/2014); Cardiac catheterization (N/A, 12/22/2015); IR VenoUS Sampling (04/14/2017); IR US Guide Vasc Access Right (04/14/2017); IR Venogram Renal Bi (04/14/2017); IR VenoUS Sampling (04/14/2017); and IR Venogram Adrenal Bi (04/14/2017).  Allergies  Allergen Reactions  . Penicillins  Other (See Comments)    Makes his throat itch really bad, and feels like it is closing up.  Has patient had a PCN reaction causing immediate rash, facial/tongue/throat swelling, SOB or lightheadedness with hypotension: Yes Has patient had a PCN reaction causing severe rash involving mucus membranes or skin necrosis: No Has patient had a PCN reaction that required hospitalization: Yes Has patient had a PCN reaction occurring within the last 10 years: No If all of the above answers are "NO", then may proceed with Cephalospo  . Shrimp [Shellfish Allergy]     Throat swells    No current facility-administered medications on file prior to encounter.    Current Outpatient Medications on File Prior to Encounter  Medication Sig  . acetaminophen-codeine (TYLENOL #3) 300-30 MG tablet Take 1 tablet by mouth every 6 (six) hours as needed for moderate pain.  Marland Kitchen amLODipine (NORVASC) 10 MG tablet Take 1 tablet (10 mg total) by mouth daily.  Marland Kitchen aspirin EC 81 MG tablet Take 1 tablet (81 mg total) by mouth daily.  Marland Kitchen atorvastatin (LIPITOR) 80 MG tablet Take 1 tablet (80 mg total) by mouth daily at 6 PM.  . furosemide (LASIX) 40 MG tablet Take 1 tablet (40 mg total) by mouth daily.  . hydrALAZINE (APRESOLINE) 100 MG tablet Take 1 tablet (100 mg total) by mouth 3 (three) times daily.  Marland Kitchen HYDROcodone-acetaminophen (NORCO/VICODIN) 5-325 MG tablet Take 2 tablets by mouth every 6 (six) hours as needed. (Patient taking differently: Take 2 tablets by mouth every 6 (six) hours as needed for moderate pain. )  . isosorbide mononitrate (IMDUR) 60 MG 24 hr tablet Take 1 tablet (60 mg total) by mouth daily.  Marland Kitchen  labetalol (NORMODYNE) 200 MG tablet Take 2 tablets (400 mg total) by mouth 2 (two) times daily.  . nitroGLYCERIN (NITROSTAT) 0.4 MG SL tablet Place 1 tablet (0.4 mg total) under the tongue every 5 (five) minutes as needed. For chest pain  . potassium chloride SA (K-DUR,KLOR-CON) 20 MEQ tablet Take 2 tablets (40 mEq total)  by mouth daily.  Marland Kitchen spironolactone (ALDACTONE) 50 MG tablet Take 1 tablet (50 mg total) by mouth daily.    FAMILY HISTORY:  His indicated that his mother is deceased. He indicated that his father is deceased. He indicated that his maternal grandmother is deceased. He indicated that his maternal grandfather is deceased. He indicated that his paternal grandmother is deceased. He indicated that his paternal grandfather is deceased. He indicated that the status of his neg hx is unknown.   SOCIAL HISTORY: He  reports that he has quit smoking. His smoking use included cigarettes. He has a 3.20 pack-year smoking history. he has never used smokeless tobacco. He reports that he does not drink alcohol or use drugs.  REVIEW OF SYSTEMS:   Not obtainable  SUBJECTIVE:  As above  VITAL SIGNS: BP (!) 127/58   Pulse 92   Temp 98.6 F (37 C) (Axillary)   Resp (!) 27   Ht 5\' 9"  (1.753 m)   Wt 240 lb 4.8 oz (109 kg)   SpO2 95%   BMI 35.49 kg/m   HEMODYNAMICS:    VENTILATOR SETTINGS: Vent Mode: PSV;CPAP FiO2 (%):  [40 %-60 %] 40 % Set Rate:  [20 bmp] 20 bmp Vt Set:  [530 mL] 530 mL PEEP:  [5 cmH20] 5 cmH20 Pressure Support:  [5 cmH20-10 cmH20] 10 cmH20 Plateau Pressure:  [18 cmH20-25 cmH20] 20 cmH20  INTAKE / OUTPUT: I/O last 3 completed shifts: In: 3580.7 [I.V.:3580.7] Out: 905 [Urine:905]  PHYSICAL EXAMINATION: General: Orally intubated and mechanically ventilated. Neuro: He is easily awakened and nods appropriately to questions.  He moves all fours on request.  Pupils are equal and reactive, he has overt disconjugate gaze. Cardiovascular: S1 and S2 are currently regular without murmur rub or gallop.  He has no overt dependent edema.   Lungs: Respirations are unlabored on a pressure support of 10, there is symmetric air movement, no wheezes, no rhonchi. Abdomen: The abdomen is obese and soft without any organomegaly masses or tenderness.  LABS:  BMET Recent Labs  Lab  02/05/18 0649 02/05/18 0654  NA 137 140  K 3.8 3.8  CL 102 101  CO2 22  --   BUN 24* 26*  CREATININE 1.86* 1.90*  GLUCOSE 193* 199*    Electrolytes Recent Labs  Lab 02/05/18 0649  CALCIUM 9.0    CBC Recent Labs  Lab 02/05/18 0649 02/05/18 0654  WBC 8.8  --   HGB 14.0 15.0  HCT 40.6 44.0  PLT 300  --     Coag's Recent Labs  Lab 02/05/18 0649  APTT 28  INR 0.93    Sepsis Markers No results for input(s): LATICACIDVEN, PROCALCITON, O2SATVEN in the last 168 hours.  ABG Recent Labs  Lab 02/05/18 0836  PHART 7.290*  PCO2ART 68.1*  PO2ART 120.0*    Liver Enzymes Recent Labs  Lab 02/05/18 0649  AST 21  ALT 34  ALKPHOS 67  BILITOT 0.7  ALBUMIN 3.9    Cardiac Enzymes No results for input(s): TROPONINI, PROBNP in the last 168 hours.  Glucose Recent Labs  Lab 02/05/18 0649 02/05/18 1707 02/05/18 2133 02/06/18 0743  GLUCAP 200*  133* 114* 119*    Imaging Ct Head Wo Contrast  Result Date: 02/06/2018 CLINICAL DATA:  Follow-up examination for intracranial hemorrhage. EXAM: CT HEAD WITHOUT CONTRAST TECHNIQUE: Contiguous axial images were obtained from the base of the skull through the vertex without intravenous contrast. COMPARISON:  Prior CT from 02/05/2018. FINDINGS: Brain: Acute intraparenchymal hemorrhage involving the dorsal left para median medulla again seen, measuring 13 x 15 x 24 mm (estimated volume 2.3 mL). Mild localized vasogenic edema without significant regional mass effect. Intraventricular extension with blood in the fourth ventricle and cerebral aqueduct, extending into the posterior aspect of the third ventricle, similar to previous. Slightly increased dilatation of the lateral and third ventricles without hydrocephalus. No new intracranial hemorrhage. No acute large vessel territory infarct. No mass lesion or midline shift. Underlying atrophy with chronic small vessel ischemic disease again noted. No extra-axial fluid collection. Vascular:  No hyperdense vessel. Calcified atherosclerosis at the skull base. Skull: Scalp soft tissues and calvarium within normal limits. Sinuses/Orbits: Globes and orbital soft tissues within normal limits. Scattered mucosal thickening throughout the ethmoidal air cells and maxillary sinuses with superimposed air-fluid level within the right maxillary sinus. Small right mastoid effusion. Other: None. IMPRESSION: 1. No significant interval change in size of acute left paramedian pontomedullary hemorrhage, with similar extension into the adjacent fourth ventricle. No significant mass effect. Minimally increased ventriculomegaly as compared to previous without hydrocephalus. 2. No other new acute intracranial abnormality. 3. Interval development of acute maxillary sinusitis. Electronically Signed   By: Rise Mu M.D.   On: 02/06/2018 07:19   Mr Shirlee Latch RU Contrast  Result Date: 02/05/2018 CLINICAL DATA:  Severe headache and double vision, began earlier today. Blood pressure greater than 200 systolic. Severe dysarthria. EXAM: MRI HEAD WITHOUT CONTRAST MRA HEAD WITHOUT CONTRAST TECHNIQUE: Multiplanar, multiecho pulse sequences of the brain and surrounding structures were obtained without intravenous contrast. Angiographic images of the head were obtained using MRA technique without contrast. COMPARISON:  CT head earlier today. FINDINGS: MRI HEAD FINDINGS Brain: There is an acute dorsal LEFT paramedian brainstem infarct, centered at the pontomedullary junction. Cross-sectional measurements of the parenchymal hemorrhage are 14 x 15 x 21 mm, corresponding to a volume of 2-3 mL, although it is difficult to quantify the additional blood which has extended into the fourth ventricle, aqueduct, and posterior third ventricle. There is mild surrounding brainstem edema. There is obscuration of the CSF within the fourth ventricle, with mass effect on the distal aqueduct, and the patient is at risk for obstructive  hydrocephalus. Age advanced atrophy. Premature for age moderate T2 and FLAIR hyperintensities throughout the brain, likely chronic microvascular ischemic change due to hypertension. Multiple foci of hemosiderin are seen in the RIGHT cerebral hemisphere, RIGHT thalamus, midbrain and pons, as well as RIGHT cerebellum, consistent with old hypertensive related bleeds. Vascular: Described below. Skull and upper cervical spine: Normal marrow signal. Sinuses/Orbits: Chronic sinus disease.  Negative orbits. Other: None. MRA HEAD FINDINGS The internal carotid arteries are markedly dolichoectatic but widely patent. 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 There is a 75% stenosis of the distal LEFT vertebral artery in its V4 segment. The RIGHT vertebral appears to contribute only to PICA. No visible cerebellar branch occlusion. There is no visible vascular malformation or saccular aneurysm. IMPRESSION: Acute LEFT paramedian pontomedullary hemorrhage, roughly similar in size to earlier CT, 14 x 15 x 21 mm corresponding to an approximate volume of 2-3 mL. Given  the numerous microbleeds elsewhere in the brain, as well as markedly elevated blood pressure on presentation, this abnormality is felt to most likely represent a hypertensive related hemorrhage. Premature atrophy with chronic microvascular ischemic change. There is mass effect on the fourth ventricle and aqueduct, and hemorrhage has extended into the adjacent ventricular system. The patient is at risk for development of hydrocephalus. 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 Internal carotid arteries are widely patent. No visible saccular aneurysm or vascular malformation. Electronically Signed   By: Elsie Stain M.D.   On: 02/05/2018 10:42   Mr Brain Wo Contrast  Result Date: 02/05/2018 CLINICAL DATA:  Severe headache and double vision,  began earlier today. Blood pressure greater than 200 systolic. Severe dysarthria. EXAM: MRI HEAD WITHOUT CONTRAST MRA HEAD WITHOUT CONTRAST TECHNIQUE: Multiplanar, multiecho pulse sequences of the brain and surrounding structures were obtained without intravenous contrast. Angiographic images of the head were obtained using MRA technique without contrast. COMPARISON:  CT head earlier today. FINDINGS: MRI HEAD FINDINGS Brain: There is an acute dorsal LEFT paramedian brainstem infarct, centered at the pontomedullary junction. Cross-sectional measurements of the parenchymal hemorrhage are 14 x 15 x 21 mm, corresponding to a volume of 2-3 mL, although it is difficult to quantify the additional blood which has extended into the fourth ventricle, aqueduct, and posterior third ventricle. There is mild surrounding brainstem edema. There is obscuration of the CSF within the fourth ventricle, with mass effect on the distal aqueduct, and the patient is at risk for obstructive hydrocephalus. Age advanced atrophy. Premature for age moderate T2 and FLAIR hyperintensities throughout the brain, likely chronic microvascular ischemic change due to hypertension. Multiple foci of hemosiderin are seen in the RIGHT cerebral hemisphere, RIGHT thalamus, midbrain and pons, as well as RIGHT cerebellum, consistent with old hypertensive related bleeds. Vascular: Described below. Skull and upper cervical spine: Normal marrow signal. Sinuses/Orbits: Chronic sinus disease.  Negative orbits. Other: None. MRA HEAD FINDINGS The internal carotid arteries are markedly dolichoectatic but widely patent. 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 There is a 75% stenosis of the distal LEFT vertebral artery in its V4 segment. The RIGHT vertebral appears to contribute only to PICA. No visible cerebellar branch occlusion. There is no visible vascular malformation or saccular aneurysm.  IMPRESSION: Acute LEFT paramedian pontomedullary hemorrhage, roughly similar in size to earlier CT, 14 x 15 x 21 mm corresponding to an approximate volume of 2-3 mL. Given the numerous microbleeds elsewhere in the brain, as well as markedly elevated blood pressure on presentation, this abnormality is felt to most likely represent a hypertensive related hemorrhage. Premature atrophy with chronic microvascular ischemic change. There is mass effect on the fourth ventricle and aqueduct, and hemorrhage has extended into the adjacent ventricular system. The patient is at risk for development of hydrocephalus. 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 Internal carotid arteries are widely patent. No visible saccular aneurysm or vascular malformation. Electronically Signed   By: Elsie Stain M.D.   On: 02/05/2018 10:42     ANTIBIOTICS: None  DISCUSSION:      This is a 52 year old with a nonischemic cardiomyopathy who is suffered from a pontomedullary hemorrhage.  His tox screen is positive for cocaine.  ASSESSMENT / PLAN:  PULMONARY A: Minute ventilation is marginally elevated and it is not clear that he is  going to tolerate a spontaneous breathing trial.  From a mental status standpoint I am willing to extubate the patient however mechanics may preclude it.  Should he fail his spontaneous breathing trial I will be obtaining a chest x-ray to evaluate for potential CHF and other parenchymal issues.  CARDIOVASCULAR A: He has a history of nonischemic cardiomyopathy.  He has been started back on a beta-blocker, Lasix, and spironolactone.  I am awaiting his creatinine to decide on a choice of afterload reducing agent.  He obviously needs to be admonished not to do any more cocaine.   NEUROLOGIC A: No progression of neurological deficits.     Penny Pia, MD Pulmonary and Critical Care Medicine [facility] Pager: [phone]  02/06/2018, 9:27  AM

## 2018-02-06 NOTE — Progress Notes (Signed)
SLP Cancellation Note  Patient Details Name: [name] MRN: [id number] DOB: 1966/06/23   Cancelled treatment:       Reason Eval/Treat Not Completed: Patient not medically ready   Sony Schlarb, Riley Nearing 02/06/2018, 7:47 AM

## 2018-02-06 NOTE — Progress Notes (Signed)
Patient transported from 4N22 to CT and back with no complications. 

## 2018-02-06 NOTE — Progress Notes (Signed)
Initial Nutrition Assessment  DOCUMENTATION CODES:   Obesity unspecified  INTERVENTION:   Vital High Protein @ 15 ml/hr (360 ml/day) via OG tube 60 ml Prostat QID  Provides: 1160 kcal, 151 grams protein, and 300 ml free water.    NUTRITION DIAGNOSIS:   Inadequate oral intake related to inability to eat as evidenced by NPO status.  GOAL:   Provide needs based on ASPEN/SCCM guidelines  MONITOR:   TF tolerance, Labs  REASON FOR ASSESSMENT:   Consult, Ventilator Enteral/tube feeding initiation and management  ASSESSMENT:   Pt with PMH of nonischemic cardiomyopathy, CKD, HLD, noncompliance, CHF, HTN, cocaine abuse admitted with extreme HTN. CT shows L pontomedullary hemorrhage likely from HTN.    Pt discussed during ICU rounds and with RN.   Patient is currently intubated on ventilator support Temp (24hrs), Avg:98.9 F (37.2 C), Min:98.6 F (37 C), Max:99.5 F (37.5 C)  Propofol: 9.8 ml/hr provides: 258 kcal/day Medications reviewed and include: lasix, SSI, 40 mEq KCl daily, senokot-s, spironolactone  NS with 20 mEq KCl @ 50 ml/hr  Labs reviewed: TG: 238 (H) UOP day of admission 905 ml  NUTRITION - FOCUSED PHYSICAL EXAM:    Most Recent Value  Orbital Region  No depletion  Upper Arm Region  No depletion  Thoracic and Lumbar Region  No depletion  Buccal Region  No depletion  Temple Region  No depletion  Clavicle Bone Region  No depletion  Clavicle and Acromion Bone Region  No depletion  Scapular Bone Region  No depletion  Dorsal Hand  No depletion  Patellar Region  No depletion  Anterior Thigh Region  No depletion  Posterior Calf Region  No depletion  Edema (RD Assessment)  None  Hair  Reviewed  Eyes  Unable to assess  Mouth  Unable to assess  Skin  Reviewed  Nails  Reviewed       Diet Order:  Fall precautions Diet NPO time specified  EDUCATION NEEDS:   No education needs have been identified at this time  Skin:  Skin Assessment: Reviewed RN  Assessment  Last BM:  unknown  Height:   Ht Readings from Last 1 Encounters:  02/05/18 5\' 9"  (1.753 m)    Weight:   Wt Readings from Last 1 Encounters:  02/05/18 240 lb 4.8 oz (109 kg)  Weight: 220 lb (01/19/18)  Ideal Body Weight:  72.7 kg  BMI:  Body mass index is 35.49 kg/m.  Estimated Nutritional Needs:   Kcal:  6468-0321  Protein:  >/= 145 grams  Fluid:  2 L/day  Kendell Bane RD, LDN, CNSC [phone] Pager [phone] After Hours Pager

## 2018-02-06 NOTE — Progress Notes (Signed)
Carotid artery duplex has been completed. 1-39% ICA stenosis bilaterally.  02/06/18 12:47 PM Olen Cordial RVT

## 2018-02-06 NOTE — Progress Notes (Signed)
OT Cancellation Note  Patient Details Name: JAYIN CHESEBRO MRN: [id number] DOB: 1965/11/26   Cancelled Treatment:    Reason Eval/Treat Not Completed: Active bedrest order;Patient not medically ready. Pt with bedrest orders and remains intubated and sedated. Will check back as appropriate.  Doristine Section, MS OTR/L  Pager: [phone]   Derl Abalos A Greggory Pautsch 02/06/2018, 6:45 AM

## 2018-02-06 NOTE — Progress Notes (Signed)
  Echocardiogram 2D Echocardiogram has been performed.  [name] 02/06/2018, 12:33 PM

## 2018-02-06 NOTE — Progress Notes (Addendum)
STROKE TEAM PROGRESS NOTE   HISTORY OF PRESENT ILLNESS (per record) [name] is an 52 y.o. male past medical history of coronary  Artery disease, chronic heart failure, CKD, hypertension presents to [facility] emergency room as  Stroke alert.  Was last known normal last night 11 PM when he went to bed. He woke up this morning around 6:30 AM, complained of severe headache and double vision. When EMS arrived here forced gaze devaiton to right side and severe dysarthria. BP was >200 systolic. On arrival, stat CT head showed a pontine/midbrain hemorrhage with IVH in the 4th ventricle. Patient was severely dysarthric and intubated for airway protection. He received 40mg  of labetalol and started on Cardene drip. Neurosurgery was consulted for potential EVD placement in case he develops hydrocephalus.  Date last known well: 3.16.19 Time last known well: 11pm tPA Given: no, ICH  NIHSS: 8 Baseline MRS 0    SUBJECTIVE (INTERVAL HISTORY) The patient's wife was at the bedside.  He remains intubated but is able to follow some commands.  Hope to extubate soon.Blood pressure adequately controlled. Follow-up CT scan of the head today shows stable appearance of the brainstem hemorrhage without significant hydrocephalus  OBJECTIVE Temp:  [98.6 F (37 C)-99.5 F (37.5 C)] 98.7 F (37.1 C) (03/18 1200) Pulse Rate:  [83-99] 99 (03/18 1403) Cardiac Rhythm: Normal sinus rhythm (03/18 0800) Resp:  [0-27] 21 (03/18 1403) BP: (112-165)/(56-96) 131/77 (03/18 1330) SpO2:  [92 %-100 %] 99 % (03/18 1403) FiO2 (%):  [40 %] 40 % (03/18 1403)  CBC:  Recent Labs  Lab 02/05/18 0649 02/05/18 0654  WBC 8.8  --   NEUTROABS 3.6  --   HGB 14.0 15.0  HCT 40.6 44.0  MCV 91.4  --   PLT 300  --     Basic Metabolic Panel:  Recent Labs  Lab 02/05/18 0649 02/05/18 0654 02/06/18 1037  NA 137 140 139  K 3.8 3.8 3.6  CL 102 101 105  CO2 22  --  24  GLUCOSE 193* 199* 135*  BUN 24* 26* 23*  CREATININE  1.86* 1.90* 1.97*  CALCIUM 9.0  --  8.4*    Lipid Panel:     Component Value Date/Time   CHOL 123 02/06/2018 1037   TRIG 238 (H) 02/06/2018 1037   HDL 29 (L) 02/06/2018 1037   CHOLHDL 4.2 02/06/2018 1037   VLDL 48 (H) 02/06/2018 1037   LDLCALC 46 02/06/2018 1037   HgbA1c:  Lab Results  Component Value Date   HGBA1C 6.1 (H) 02/06/2018   Urine Drug Screen:     Component Value Date/Time   LABOPIA NONE DETECTED 02/05/2018 1322   COCAINSCRNUR POSITIVE (A) 02/05/2018 1322   COCAINSCRNUR See Final Results 12/15/2015 1012   LABBENZ NONE DETECTED 02/05/2018 1322   AMPHETMU NONE DETECTED 02/05/2018 1322   THCU NONE DETECTED 02/05/2018 1322   LABBARB NONE DETECTED 02/05/2018 1322    Alcohol Level     Component Value Date/Time   ETH <10 02/05/2018 1006    IMAGING   Dg Chest Portable 1 View 02/05/2018 IMPRESSION:  1. Endotracheal tube is low at 1 cm from carina. Consider retraction by 2-3 cm.  2. NG tube in stomach  3. Low lung volumes    CT Head Wo Contrast 02/06/2018 IMPRESSION: 1. No significant interval change in size of acute left paramedian pontomedullary hemorrhage, with similar extension into the adjacent fourth ventricle. No significant mass effect. Minimally increased ventriculomegaly as compared to previous without hydrocephalus.  2. No other new acute intracranial abnormality. 3. Interval development of acute maxillary sinusitis.    Ct Head Code Stroke Wo Contrast  02/05/2018 IMPRESSION:  1. Acute dorsal LEFT paramedian brainstem hemorrhage at the pontomedullary junction, extending into the fourth ventricle. Etiology is undetermined. Hypertension, vascular malformation, anticoagulation, unrecognized trauma, or drug use are all considerations.  2. ASPECTS is not applicable.  3. Attempts are being made to contact the on-call neurologist.    MRI / MRA Head  02/05/2018 IMPRESSION: Acute LEFT paramedian pontomedullary hemorrhage, roughly similar in size to  earlier  CT, 14 x 15 x 21 mm corresponding to an approximate volume of 2-3 mL.  Given the numerous microbleeds elsewhere in the brain, as well as markedly elevated blood pressure on presentation, this abnormality is felt to most likely represent a hypertensive related hemorrhage.  Premature atrophy with chronic microvascular ischemic change.  There is mass effect on the fourth ventricle and aqueduct, and hemorrhage has extended into the adjacent ventricular system. The patient is at risk for development of hydrocephalus.  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 Internal carotid arteries are widely patent. No visible saccular aneurysm or vascular malformation.   Transthoracic Echocardiogram - pending 07/18/2017   Bilateral Carotid Dopplers - pending 00/00/00      PHYSICAL EXAM Vitals:   02/06/18 1300 02/06/18 1315 02/06/18 1330 02/06/18 1403  BP: 129/78 123/73 131/77   Pulse: 92 94 92 99  Resp: 20 20 18  (!) 21  Temp:      TempSrc:      SpO2: 99% 99% 99% 99%  Weight:      Height:       Pleasant middle-aged African-American gentleman who is sedated and intubated. . Afebrile. Head is nontraumatic. Neck is supple without bruit.    Cardiac exam no murmur or gallop. Lungs are clear to auscultation. Distal pulses are well felt. Neurological Exam :  Sedated and intubated.  Drowsy but opens eyes and follows commands.  Right gaze preference.  Left gaze palsy.  Nystagmus when he looks to the right with a. downbeat component.   .  Skew eye deviation with left eye deviated downwards.  Pupils 3 mm equal reactive.  Fundi not visualized.  Blinks to threat more on the right than the left.  Face is symmetric.  Tongue midline.  Has a good cough and gag.  Motor system exam patient is able to move all 4 extremities against gravity but moves left side more than right.  Deep tendon reflexes are 2+ symmetric.  Plantars are downgoing.   Sensation appears preserved bilaterally.  Gait not tested.  ASSESSMENT/PLAN [name] is a 52 y.o. male with history of coronary artery disease, cardiomyopathy, congestive heart failure, chronic kidney disease, hypertension, medical noncompliance, obesity, obstructive sleep apnea, previous tobacco use, and previous cocaine use presenting with a severe headache, diplopia, gaze deviation, elevated blood pressure, and dysarthria. He did not receive IV t-PA due to hemorrhage.  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  Carotid Doppler - pending  2D Echo - pending  UDS - positive  for cocaine  LDL - 46  HgbA1c - 6.1  VTE prophylaxis - SCDs Fall precautions Diet NPO time specified  aspirin 81 mg daily prior to admission, now on No antithrombotic  Ongoing aggressive stroke risk factor management  Therapy recommendations:  pending  Disposition:  Pending  Hypertension  BP somewhat high at times.  Systolic blood pressure goal of less than 140 for the first 24 hours.  Long-term BP goal normotensive  Hyperlipidemia  Home meds: Lipitor 80 mg daily prior to admission now on hold secondary to hemorrhage.  LDL 46, goal < 70  Resume statin at discharge   Other Stroke Risk Factors  Former cigarette smoker - quit  Obesity, Body mass index is 35.49 kg/m., recommend weight loss, diet and exercise as appropriate   Coronary artery disease  Obstructive sleep apnea   Other Active Problems  75% stenosis of the distal Lt VA, which is the sole contributor to the basilar  artery.  Coronary artery disease /cardiomyopathy  History of drug use - UDS positive for cocaine  CKD - BUN 23 ; creatinine 1.97  Hyperglycemia -hemoglobin A1c 6.1  Head CT 02/06/2018 - stable  Intubated  NPO -consider tube feedings if not extubated soon and able to eat.  Systolic blood pressure goal liberalized to < 180 mmHg. Home BP meds restarted.  Delton See PA-C [facility] Pager [phone] 02/06/2018, 2:42 PM   Plan / Recommendations   Continue strict blood pressure control with systolic goal below  180    Patient could be extubated   if he is able to protect his airwaya CCM feels he is ready.  Discussed with patient`s wife at the bedside and answered questions.  Discussed with Dr. Warren Lacy pulmonary critical care medicine. This patient is critically ill and at significant risk of neurological worsening, death and care requires constant monitoring of vital signs, hemodynamics,respiratory and cardiac monitoring, extensive review of multiple databases, frequent neurological assessment, discussion with family, other specialists and medical decision making of high complexity.I have made any additions or clarifications directly to the above note.This critical care time does not reflect procedure time, or teaching time or supervisory time of PA/NP/Med Resident etc but could involve care discussion time.  I spent 35  minutes of neurocritical care time  in the care of  this patient. Delia Heady, MD Medical Director [facility] Pager: [phone] 02/06/2018 5:02 PM   02/06/2018 2:42 PM      Hospital day # 1     To contact Stroke Continuity provider, please refer to [email or web address]. After hours, contact General Neurology

## 2018-02-06 NOTE — Progress Notes (Signed)
PT Cancellation Note  Patient Details Name: [name] MRN: [id number] DOB: 11/12/1966   Cancelled Treatment:    Reason Eval/Treat Not Completed: Patient not medically ready;Active bedrest order   Fabio Asa 02/06/2018, 7:49 AM Charlotte Crumb, PT DPT  Board Certified Neurologic Specialist [phone]

## 2018-02-06 NOTE — Progress Notes (Signed)
Patient ID: [name], male   DOB: [birth year], 52 y.o.   MRN: [id number] Reportedly patient's neuro exam remained stable 1 light sedation follows commands briskly  Working towards extubation. Follow-up CT scan shows no hydrocephalus.

## 2018-02-07 ENCOUNTER — Inpatient Hospital Stay (HOSPITAL_COMMUNITY): Payer: Medicaid Other

## 2018-02-07 DIAGNOSIS — J9601 Acute respiratory failure with hypoxia: Secondary | ICD-10-CM

## 2018-02-07 LAB — CBC WITH DIFFERENTIAL/PLATELET
BASOS PCT: 0 %
Basophils Absolute: 0 10*3/uL (ref 0.0–0.1)
EOS ABS: 0.2 10*3/uL (ref 0.0–0.7)
Eosinophils Relative: 2 %
HCT: 37.3 % — ABNORMAL LOW (ref 39.0–52.0)
HEMOGLOBIN: 12 g/dL — AB (ref 13.0–17.0)
Lymphocytes Relative: 16 %
Lymphs Abs: 1.5 10*3/uL (ref 0.7–4.0)
MCH: 30.2 pg (ref 26.0–34.0)
MCHC: 32.2 g/dL (ref 30.0–36.0)
MCV: 93.7 fL (ref 78.0–100.0)
MONOS PCT: 14 %
Monocytes Absolute: 1.2 10*3/uL — ABNORMAL HIGH (ref 0.1–1.0)
NEUTROS PCT: 68 %
Neutro Abs: 6.2 10*3/uL (ref 1.7–7.7)
PLATELETS: 290 10*3/uL (ref 150–400)
RBC: 3.98 MIL/uL — ABNORMAL LOW (ref 4.22–5.81)
RDW: 14.6 % (ref 11.5–15.5)
WBC: 9.1 10*3/uL (ref 4.0–10.5)

## 2018-02-07 LAB — GLUCOSE, CAPILLARY
GLUCOSE-CAPILLARY: 132 mg/dL — AB (ref 65–99)
GLUCOSE-CAPILLARY: 106 mg/dL — AB (ref 65–99)
GLUCOSE-CAPILLARY: 135 mg/dL — AB (ref 65–99)
Glucose-Capillary: 114 mg/dL — ABNORMAL HIGH (ref 65–99)
Glucose-Capillary: 119 mg/dL — ABNORMAL HIGH (ref 65–99)
Glucose-Capillary: 96 mg/dL (ref 65–99)

## 2018-02-07 LAB — MAGNESIUM
Magnesium: 2 mg/dL (ref 1.7–2.4)
Magnesium: 2 mg/dL (ref 1.7–2.4)

## 2018-02-07 LAB — BASIC METABOLIC PANEL
ANION GAP: 12 (ref 5–15)
BUN: 24 mg/dL — ABNORMAL HIGH (ref 6–20)
CALCIUM: 9 mg/dL (ref 8.9–10.3)
CO2: 24 mmol/L (ref 22–32)
Chloride: 104 mmol/L (ref 101–111)
Creatinine, Ser: 1.82 mg/dL — ABNORMAL HIGH (ref 0.61–1.24)
GFR, EST AFRICAN AMERICAN: 48 mL/min — AB (ref >60)
GFR, EST NON AFRICAN AMERICAN: 41 mL/min — AB (ref >60)
GLUCOSE: 128 mg/dL — AB (ref 65–99)
Potassium: 3.9 mmol/L (ref 3.5–5.1)
Sodium: 140 mmol/L (ref 135–145)

## 2018-02-07 LAB — PHOSPHORUS
PHOSPHORUS: 3.8 mg/dL (ref 2.5–4.6)
Phosphorus: 3.9 mg/dL (ref 2.5–4.6)

## 2018-02-07 MED ORDER — DEXMEDETOMIDINE HCL IN NACL 200 MCG/50ML IV SOLN
0.2000 ug/kg/h | INTRAVENOUS | Status: DC
  Administered 2018-02-07 (×3): 0.5 ug/kg/h via INTRAVENOUS
  Administered 2018-02-07: 0.4 ug/kg/h via INTRAVENOUS
  Administered 2018-02-08 (×2): 0.5 ug/kg/h via INTRAVENOUS
  Administered 2018-02-08: 0.2 ug/kg/h via INTRAVENOUS
  Administered 2018-02-08: 0.5 ug/kg/h via INTRAVENOUS
  Filled 2018-02-07 (×8): qty 50

## 2018-02-07 MED ORDER — LABETALOL HCL 5 MG/ML IV SOLN: 20.0000 mg | INTRAVENOUS | Status: DC | PRN

## 2018-02-07 MED ORDER — LABETALOL HCL 5 MG/ML IV SOLN
20.0000 mg | INTRAVENOUS | Status: DC | PRN
  Administered 2018-02-08: 20 mg via INTRAVENOUS
  Administered 2018-02-09: 40 mg via INTRAVENOUS
  Administered 2018-02-09 – 2018-02-10 (×4): 20 mg via INTRAVENOUS
  Administered 2018-02-10: 40 mg via INTRAVENOUS
  Administered 2018-02-10: 20 mg via INTRAVENOUS
  Filled 2018-02-07 (×4): qty 4
  Filled 2018-02-07: qty 8
  Filled 2018-02-07: qty 4
  Filled 2018-02-07: qty 8
  Filled 2018-02-07: qty 4

## 2018-02-07 MED ORDER — FENTANYL CITRATE (PF) 100 MCG/2ML IJ SOLN
25.0000 ug | INTRAMUSCULAR | Status: DC | PRN
  Administered 2018-02-09 (×2): 25 ug via INTRAVENOUS
  Filled 2018-02-07 (×2): qty 2

## 2018-02-07 NOTE — Progress Notes (Signed)
Inpatient Rehabilitation  Per PT/OT request, patient was screened by Wayland Baik for appropriateness for an Inpatient Acute Rehab consult.  At this time we are recommending an Inpatient Rehab consult.  Please order if you are agreeable.    Genieve Ramaswamy, M.A., CCC/SLP Admission Coordinator  [facility]  Cell [phone]  

## 2018-02-07 NOTE — Evaluation (Signed)
Occupational Therapy Evaluation Patient Details Name: [name] MRN: [id number] DOB: [birth year] Today's Date: 02/07/2018    History of Present Illness [name] is an 52 y.o. male past medical history of coronary  Artery disease, chronic heart failure, CKD, hypertension presents to [facility] emergency room . Pt presented with HA and double vision. 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    Clinical Impression   PTA, pt was living with his friend and was independent. Pt currently requiring Mod A UB ADLs, Max A for LB ADLs, and Min-Mod A for bed mobility. Pt performing simple grooming tasks and BUE/BLE exercises at EOB with Min Guard for safety. Pt following cues and maintaining attention to ADLs demonstrating high motivation to participate in therapy. Pt presenting with significant visual deficits. Pt reports diplopia and presents with significant right gaze and head turn preference. Pt will require from further acute OT to facilitate safe dc and increase functional performance. Due to age, PLOF, and deficits, recommend dc to CIR for further OT to optimize safety, independence with ADLs, and return to PLOF.      Follow Up Recommendations  CIR;Supervision/Assistance - 24 hour    Equipment Recommendations  Other (comment)(Defer to next venue)    Recommendations for Other Services Rehab consult;PT consult;Speech consult     Precautions / Restrictions Precautions Precautions: Other (comment);Fall(vent) Restrictions Weight Bearing Restrictions: No      Mobility Bed Mobility Overal bed mobility: Needs Assistance Bed Mobility: Supine to Sit;Sit to Supine     Supine to sit: Min assist;+2 for safety/equipment;HOB elevated Sit to supine: Mod assist;+2 for physical assistance   General bed mobility comments: Min A to elevate trunk into sitting. Pt able to transition hips towards EOB with Min ARequiring Mod A to bring BLEs over EOB and facilatite safe trunk descent.    Transfers                 General transfer comment: Not attempted due to intubation    Balance Overall balance assessment: Needs assistance Sitting-balance support: No upper extremity supported;Feet unsupported Sitting balance-Leahy Scale: Fair Sitting balance - Comments: Pt maintaining sitting balance at EOB without LOB during grooming and excercises                                   ADL either performed or assessed with clinical judgement   ADL Overall ADL's : Needs assistance/impaired Eating/Feeding: NPO   Grooming: Wash/dry face;Min guard;Sitting Grooming Details (indicate cue type and reason): Pt able to wash his face while sitting at EOB. demonstrating good safety awarness to avoid trach while performing task.  Upper Body Bathing: Moderate assistance;Sitting   Lower Body Bathing: Maximal assistance;Bed level Lower Body Bathing Details (indicate cue type and reason): Due to intubation, pt requiring Max A for LB bathing at bed level Upper Body Dressing : Moderate assistance;Sitting   Lower Body Dressing: Maximal assistance;Bed level Lower Body Dressing Details (indicate cue type and reason): donning socks at bed level. Pt able elevate BLEs off bed to assist with donning socks               General ADL Comments: Pt performing bed mobility to EOB and then completing simple grooming task and BUE excercises     Vision Baseline Vision/History: No visual deficits Patient Visual Report: Diplopia Vision Assessment?: Yes Eye Alignment: Impaired (comment) Ocular Range of Motion: Restricted on the left Alignment/Gaze Preference: Gaze  left;Head turned(Able to turn head to L with Max visual cues) Diplopia Assessment: Disappears with one eye closed Additional Comments: Pt reporting significant diplopia stating he sees four objects. Reports that diplopia disappears when covering left eye (pt reporting he is left eye dominant and will need to confirm). Noted  lateral nystagmus with a right directional pulse. Pt unable to perform tracking to left and had a right gaze preference. Able to turn head towards left with Max visual cues. Pt with poor eye alignment with left eye presenting with a more significant right gaze preference; particiularly when trying to gaze left.      Perception     Praxis      Pertinent Vitals/Pain Pain Assessment: (P) Faces Faces Pain Scale: Hurts even more Pain Location: Coughing Pain Descriptors / Indicators: Discomfort;Grimacing Pain Intervention(s): Monitored during session;Repositioned     Hand Dominance (P) Right   Extremity/Trunk Assessment Upper Extremity Assessment Upper Extremity Assessment: Overall WFL for tasks assessed   Lower Extremity Assessment Lower Extremity Assessment: Defer to PT evaluation       Communication Communication Communication: (P) Other (comment)(vent)   Cognition Arousal/Alertness: Awake/alert Behavior During Therapy: WFL for tasks assessed/performed Overall Cognitive Status: Difficult to assess                                 General Comments: Pt following cues throughout session. Maintaining attention and demonstrating good safety awarness to participate in BADLs while sitting EOB on vent.    General Comments  Aunt in room during session.     Exercises Exercises: General Upper Extremity General Exercises - Upper Extremity Shoulder Flexion: AROM;Both;10 reps;Seated Shoulder Extension: AROM;Both;10 reps;Seated Elbow Flexion: AROM;Both;10 reps;Seated Elbow Extension: AROM;Both;10 reps;Seated   Shoulder Instructions      Home Living Family/patient expects to be discharged to:: Private residence Living Arrangements: Spouse/significant other Available Help at Discharge: Family;Available 24 hours/day Type of Home: House Home Access: Ramped entrance     Home Layout: Two level;Able to live on main level with bedroom/bathroom     Bathroom Shower/Tub: (P)  Walk-in shower   Bathroom Toilet: (P) Handicapped height     Home Equipment: (P) None   Additional Comments: (P) Planning to dc home with brother and have family switch in and out for 24 hour support      Prior Functioning/Environment Level of Independence: (P) Independent        Comments: (P) ADLs and IADLs. Not driving and working and on disability        OT Problem List: Impaired vision/perception;Decreased knowledge of use of DME or AE;Decreased knowledge of precautions;Decreased activity tolerance      OT Treatment/Interventions: Self-care/ADL training;Therapeutic exercise;Energy conservation;DME and/or AE instruction;Therapeutic activities;Patient/family education    OT Goals(Current goals can be found in the care plan section) Acute Rehab OT Goals Patient Stated Goal: Go home OT Goal Formulation: With patient Time For Goal Achievement: 02/21/18 Potential to Achieve Goals: Good ADL Goals Pt Will Perform Upper Body Dressing: (P) with modified independence;sitting Pt Will Perform Lower Body Dressing: (P) with min guard assist;sit to/from stand Additional ADL Goal #1: (P) Pt will attend to 75% of objects in left visual field with Min cues during ADLs Additional ADL Goal #2: (P) Pt will use visual compensatory techniques with  OT Frequency: Min 3X/week   Barriers to D/C:            Co-evaluation   Reason for Co-Treatment: Complexity  of the patient's impairments (multi-system involvement);Necessary to address cognition/behavior during functional activity;For patient/therapist safety PT goals addressed during session: Mobility/safety with mobility;Balance        AM-PAC PT "6 Clicks" Daily Activity     Outcome Measure Help from another person eating meals?: Total Help from another person taking care of personal grooming?: A Little Help from another person toileting, which includes using toliet, bedpan, or urinal?: A Lot Help from another person bathing (including  washing, rinsing, drying)?: A Lot Help from another person to put on and taking off regular upper body clothing?: A Lot Help from another person to put on and taking off regular lower body clothing?: A Lot 6 Click Score: 12   End of Session Nurse Communication: Mobility status  Activity Tolerance: Patient tolerated treatment well Patient left: in bed;with call bell/phone within reach;with bed alarm set;with family/visitor present  OT Visit Diagnosis: Unsteadiness on feet (R26.81);Other abnormalities of gait and mobility (R26.89);Muscle weakness (generalized) (M62.81);Low vision, both eyes (H54.2)                Time: 1530-1602 OT Time Calculation (min): 32 min Charges:  OT General Charges $OT Visit: 1 Visit OT Evaluation $OT Eval Moderate Complexity: 1 Mod G-Codes:     Jeliyah Middlebrooks MSOT, OTR/L Acute Rehab Pager: [phone] Office: [phone]  Theodoro Grist Asar Evilsizer 02/07/2018, 4:40 PM

## 2018-02-07 NOTE — Progress Notes (Addendum)
PULMONARY / CRITICAL CARE MEDICINE   Name: [name] MRN: [id number] DOB: [birth year]    ADMISSION DATE:  02/05/2018  CHIEF COMPLAINT:  Diplopia and dysarthria  HISTORY OF PRESENT ILLNESS:   This is a 52 year old with a past medical history of nonischemic cardiomyopathy, congestive heart failure, and hypertension who on previous visits to the emergency room his screen positive for cocaine.  He presented with extreme hypertension headache, diplopia, and severe dysarthria, so severe that he required intubation for maintenance of his airway.  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  A repeat CT scan has not shown progression of hydrocephalus or of the bleed.    Failed SBT after about 30 minutes. ( Less time than 3/18) Per nursing he required more sedation while weaning than when on full vent support due to tachypnea. Repeat CT scan 3/18  shows no progression of his pontomedullary hemorrhage, his urine drug screen was positive for cocaine   SUBJECTIVE:  Sedated on propofol, but easily aroused and following commands, MAE x 4, calm on full vent support. Confirms continued diplopia, denies nausea, Failed SBT this am 2/2 tachypnea. 3/19 CXR shows persistent low lung volumes and atelectasis  VITAL SIGNS: BP (!) 190/106 Comment: cardene started  Pulse (!) 112   Temp 100 F (37.8 C) (Axillary)   Resp (!) 33   Ht 5\' 9"  (1.753 m)   Wt 233 lb 14.5 oz (106.1 kg)   SpO2 99%   BMI 34.54 kg/m   HEMODYNAMICS:    VENTILATOR SETTINGS: Vent Mode: PRVC FiO2 (%):  [40 %] 40 % Set Rate:  [20 bmp] 20 bmp Vt Set:  [530 mL] 530 mL PEEP:  [5 cmH20] 5 cmH20 Pressure Support:  [10 cmH20] 10 cmH20 Plateau Pressure:  [19 cmH20-25 cmH20] 19 cmH20  INTAKE / OUTPUT: I/O last 3 completed shifts: In: 4591.1 [I.V.:4227.1; NG/GT:364] Out: 2420 [Urine:2420]  PHYSICAL EXAMINATION: General: Orally intubated and mechanically ventilated,  follows commands. ENT: NCAT, ETT, Feeding tube, purulent nasal drainage. Neuro: He is easily aroused, follows commands, MAE x 4   Pupils are equal and reactive, he has overt disconjugate gaze, continued Diplopia. Cardiovascular: S1 and S2 , RRR, no murmur rub or gallop.  No dependent edema noted.   Lungs: Respirations are unlabored , on full vent support, bilateral chest excursion, no wheezes, no rhonchi. Diminished per bases Abdomen: The abdomen obese,  soft , NT without any organomegaly masses or tenderness.  LABS:  BMET Recent Labs  Lab 02/05/18 0649 02/05/18 0654 02/06/18 1037  NA 137 140 139  K 3.8 3.8 3.6  CL 102 101 105  CO2 22  --  24  BUN 24* 26* 23*  CREATININE 1.86* 1.90* 1.97*  GLUCOSE 193* 199* 135*    Electrolytes Recent Labs  Lab 02/05/18 0649 02/06/18 1037  CALCIUM 9.0 8.4*    CBC Recent Labs  Lab 02/05/18 0649 02/05/18 0654 02/07/18 0826  WBC 8.8  --  9.1  HGB 14.0 15.0 12.0*  HCT 40.6 44.0 37.3*  PLT 300  --  290    Coag's Recent Labs  Lab 02/05/18 0649  APTT 28  INR 0.93    Sepsis Markers No results for input(s): LATICACIDVEN, PROCALCITON, O2SATVEN in the last 168 hours.  ABG Recent Labs  Lab 02/05/18 0836  PHART 7.290*  PCO2ART 68.1*  PO2ART 120.0*    Liver Enzymes Recent Labs  Lab 02/05/18 0649  AST 21  ALT  34  ALKPHOS 67  BILITOT 0.7  ALBUMIN 3.9    Cardiac Enzymes No results for input(s): TROPONINI, PROBNP in the last 168 hours.  Glucose Recent Labs  Lab 02/06/18 0743 02/06/18 1148 02/06/18 1804 02/06/18 2103 02/06/18 2347 02/07/18 0759  GLUCAP 119* 120* 109* 105* 117* 114*    Imaging Dg Chest Port 1 View  Result Date: 02/07/2018 CLINICAL DATA:  Shortness of breath. EXAM: PORTABLE CHEST 1 VIEW COMPARISON:  Chest x-ray dated February 05, 2018. FINDINGS: Slight interval retraction of the endotracheal tube, now 1.9 cm above the carina. The enteric tube is unchanged. Stable cardiomegaly. Unchanged low lung  volumes with bibasilar atelectasis. No pneumothorax or large pleural effusion. No acute osseous abnormality. IMPRESSION: 1. Slight interval retraction of the endotracheal tube, now approximately 1.9 cm above the carina. 2. Persistent lung volumes with bibasilar atelectasis. Electronically Signed   By: Obie Dredge M.D.   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  His tox screen is positive for cocaine.  ASSESSMENT / PLAN:  PULMONARY A: Pt. Did not tolerate SBT 2/2 high RR into the 40's requiring increased sedation. CXR shows low volumes and atelectasis, stable cardiomegaly. From a mental status standpoint He is following commands. ? If this is a neuro driven issue vs. Pulmonary/ cardiovascular.  Transition to precedex to wean Wean and d/c propofol Will add low dose prn fentanyl for pain  CARDIOVASCULAR A: He has a history of nonischemic cardiomyopathy.  Blood pressure is 148/82. Creatinine has trended down slightly Continue  beta-blocker, Lasix, and spironolactone.  Hydralazine added by Dr. Otelia Limes  On 3/17 as an afterload reducing agent.  He will need counseling regarding continued cocaine use.   Renal Elevated Creatinine Plan: Trend BMET Monitor UO  Replete electrolytes prn  Infectious T Max 100 WBC 9.1 Purulent nasal secretions Will watch off antibiotics for now Culture as clinically indicated Follow fever curve and WBC   NEUROLOGIC A: No progression of neurological deficits. Continue Neuro checks per unit protocol Follow up imaging per neuro or for clinical change   Bevelyn Ngo, AGACNP-BC Pulmonary and Critical Care Medicine [facility] Pager: [phone]  02/07/2018, 9:10 AM

## 2018-02-07 NOTE — Care Management Note (Signed)
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  PTA, pt independent, lives with significant other.                       Action/Plan: Pt currently intubated and sedated; will follow for discharge planning as pt progresses.  Expected Discharge Date:                  Expected Discharge Plan:     In-House Referral:     Discharge planning Services  CM Consult  Post Acute Care Choice:    Choice offered to:     DME Arranged:    DME Agency:     HH Arranged:    HH Agency:     Status of Service:  In process, will continue to follow  If discussed at Long Length of Stay Meetings, dates discussed:    Additional Comments:  Quintella Baton, RN, BSN  Trauma/Neuro ICU Case Manager [phone]

## 2018-02-07 NOTE — Progress Notes (Signed)
OT Cancellation    02/07/18 0700  OT Visit Information  Last OT Received On 02/07/18  Reason Eval/Treat Not Completed Active bedrest order;Patient not medically ready. Will await increase in activity orders prior to initiating OT evaluation. Thank you!   Harriette Tovey MSOT, OTR/L Acute Rehab Pager: [phone] Office: [phone]

## 2018-02-07 NOTE — Evaluation (Signed)
Physical Therapy Evaluation Patient Details Name: [name] MRN: [id number] DOB: [birth year] Today's Date: 02/07/2018   History of Present Illness  [name] is an 52 y.o. male past medical history of coronary  Artery disease, chronic heart failure, CKD, hypertension presents to [facility] emergency room . Pt presented with HA and double vision. 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   Clinical Impression  Pt admitted with above diagnosis. Pt currently with functional limitations due to the deficits listed below (see PT Problem List). Pt with L neglect and notable double vision that was improved with L eye covered. Communication limited on eval by vent. Pt able to sit up to EOB with min A and maintained sitting >10 mins with min to min-guard A.  Pt will benefit from skilled PT to increase their independence and safety with mobility to allow discharge to the venue listed below.       Follow Up Recommendations CIR    Equipment Recommendations  Other (comment)(TBD)    Recommendations for Other Services Rehab consult     Precautions / Restrictions Precautions Precautions: Other (comment);Fall(vent) Restrictions Weight Bearing Restrictions: No      Mobility  Bed Mobility Overal bed mobility: Needs Assistance Bed Mobility: Supine to Sit;Sit to Supine     Supine to sit: Min assist;+2 for safety/equipment;HOB elevated Sit to supine: Mod assist;+2 for physical assistance   General bed mobility comments: Min A to elevate trunk into sitting. Pt able to transition hips towards EOB with Min ARequiring Mod A to bring BLEs over EOB and facilatite safe trunk descent.   Transfers Overall transfer level: Needs assistance Equipment used: None Transfers: Lateral/Scoot Transfers          Lateral/Scoot Transfers: Min assist;+2 safety/equipment General transfer comment: scooted to the right along the edge of bed with feet on floor. Min A given  Ambulation/Gait              General Gait Details: NT  Stairs            Wheelchair Mobility    Modified Rankin (Stroke Patients Only) Modified Rankin (Stroke Patients Only) Pre-Morbid Rankin Score: No symptoms Modified Rankin: Severe disability     Balance Overall balance assessment: Needs assistance Sitting-balance support: No upper extremity supported;Feet unsupported Sitting balance-Leahy Scale: Fair Sitting balance - Comments: Pt maintaining sitting balance at EOB without LOB during grooming and excercises                                     Pertinent Vitals/Pain Pain Assessment: Faces Faces Pain Scale: Hurts even more Pain Location: Coughing Pain Descriptors / Indicators: Discomfort;Grimacing Pain Intervention(s): Limited activity within patient's tolerance    Home Living Family/patient expects to be discharged to:: Private residence Living Arrangements: Spouse/significant other Available Help at Discharge: Family;Available 24 hours/day Type of Home: House Home Access: Ramped entrance     Home Layout: Two level;Able to live on main level with bedroom/bathroom Home Equipment: None Additional Comments: Planning to dc home with brother and have family switch in and out for 24 hour support    Prior Function Level of Independence: Independent         Comments: ADLs and IADLs. Not driving and working and on disability     Hand Dominance   Dominant Hand: Right    Extremity/Trunk Assessment   Upper Extremity Assessment Upper Extremity Assessment: Defer to OT evaluation  Lower Extremity Assessment Lower Extremity Assessment: Overall WFL for tasks assessed    Cervical / Trunk Assessment Cervical / Trunk Assessment: Normal  Communication   Communication: Other (comment)(vent)  Cognition Arousal/Alertness: Awake/alert Behavior During Therapy: WFL for tasks assessed/performed Overall Cognitive Status: Difficult to assess                                  General Comments: Pt following cues throughout session. Maintaining attention and demonstrating good safety awarness to participate in BADLs while sitting EOB on vent.       General Comments General comments (skin integrity, edema, etc.): Pt with double vision. Better when L eye covered. L neglect noted and pt and family educated on this. aunt in room beginning and end of session. Pt very motivated to mobilize.     Exercises General Exercises - Upper Extremity Shoulder Flexion: AROM;Both;10 reps;Seated Shoulder Extension: AROM;Both;10 reps;Seated Elbow Flexion: AROM;Both;10 reps;Seated Elbow Extension: AROM;Both;10 reps;Seated General Exercises - Lower Extremity Ankle Circles/Pumps: AROM;Both;10 reps;Seated Long Arc Quad: AROM;Both;10 reps;Seated Heel Slides: AROM;Both;5 reps;Supine Straight Leg Raises: AROM;Both;5 reps;Supine   Assessment/Plan    PT Assessment Patient needs continued PT services  PT Problem List Decreased balance;Decreased mobility;Decreased knowledge of precautions       PT Treatment Interventions DME instruction;Gait training;Functional mobility training;Therapeutic activities;Therapeutic exercise;Balance training;Neuromuscular re-education;Cognitive remediation;Patient/family education    PT Goals (Current goals can be found in the Care Plan section)  Acute Rehab PT Goals Patient Stated Goal: Go home PT Goal Formulation: With patient Time For Goal Achievement: 02/21/18 Potential to Achieve Goals: Good    Frequency Min 4X/week   Barriers to discharge   family is working out a way to have someone with him at all times    Co-evaluation PT/OT/SLP Co-Evaluation/Treatment: Yes Reason for Co-Treatment: Complexity of the patient's impairments (multi-system involvement);Necessary to address cognition/behavior during functional activity;For patient/therapist safety PT goals addressed during session: Mobility/safety with mobility;Balance          AM-PAC PT "6 Clicks" Daily Activity  Outcome Measure Difficulty turning over in bed (including adjusting bedclothes, sheets and blankets)?: Unable Difficulty moving from lying on back to sitting on the side of the bed? : Unable Difficulty sitting down on and standing up from a chair with arms (e.g., wheelchair, bedside commode, etc,.)?: Unable Help needed moving to and from a bed to chair (including a wheelchair)?: A Lot Help needed walking in hospital room?: Total Help needed climbing 3-5 steps with a railing? : Total 6 Click Score: 7    End of Session Equipment Utilized During Treatment: Other (comment)(vent) Activity Tolerance: Patient tolerated treatment well Patient left: in bed;with call bell/phone within reach;with family/visitor present Nurse Communication: Mobility status PT Visit Diagnosis: Other abnormalities of gait and mobility (R26.89);Pain;Dizziness and giddiness (R42);Other symptoms and signs involving the nervous system (R29.898) Pain - part of body: (throat)    Time: 2947-6546 PT Time Calculation (min) (ACUTE ONLY): 32 min   Charges:   PT Evaluation $PT Eval Moderate Complexity: 1 Mod     PT G Codes:        Lyanne Co, PT  Acute Rehab Services  [phone]   Lawana Chambers Dayvin Aber 02/07/2018, 4:47 PM

## 2018-02-07 NOTE — Progress Notes (Signed)
STROKE TEAM PROGRESS NOTE   HISTORY OF PRESENT ILLNESS (per record) [name] is an 52 y.o. male past medical history of coronary  Artery disease, chronic heart failure, CKD, hypertension presents to [facility] emergency room as  Stroke alert.  Was last known normal last night 11 PM when he went to bed. He woke up this morning around 6:30 AM, complained of severe headache and double vision. When EMS arrived here forced gaze devaiton to right side and severe dysarthria. BP was >200 systolic. On arrival, stat CT head showed a pontine/midbrain hemorrhage with IVH in the 4th ventricle. Patient was severely dysarthric and intubated for airway protection. He received 40mg  of labetalol and started on Cardene drip. Neurosurgery was consulted for potential EVD placement in case he develops hydrocephalus.  Date last known well: 3.16.19 Time last known well: 11pm tPA Given: no, ICH  NIHSS: 8 Baseline MRS 0    SUBJECTIVE (INTERVAL HISTORY) The patient's daughter is   at the bedside.  He remains intubated but is able to follow some commands.  Hope to extubate soon.Blood pressure adequately controlled.  no significant changes. He was unable to wean and became tachycardic and dyspneic on pressure support OBJECTIVE Temp:  [98.7 F (37.1 C)-100 F (37.8 C)] 99.9 F (37.7 C) (03/19 1200) Pulse Rate:  [89-117] 89 (03/19 1300) Cardiac Rhythm: Sinus tachycardia;Normal sinus rhythm (03/19 0800) Resp:  [18-36] 20 (03/19 1300) BP: (123-190)/(69-116) 144/88 (03/19 1300) SpO2:  [96 %-100 %] 100 % (03/19 1300) FiO2 (%):  [40 %] 40 % (03/19 1211) Weight:  [233 lb 14.5 oz (106.1 kg)] 233 lb 14.5 oz (106.1 kg) (03/19 0500)  CBC:  Recent Labs  Lab 02/05/18 0649 02/05/18 0654 02/07/18 0826  WBC 8.8  --  9.1  NEUTROABS 3.6  --  6.2  HGB 14.0 15.0 12.0*  HCT 40.6 44.0 37.3*  MCV 91.4  --  93.7  PLT 300  --  290    Basic Metabolic Panel:  Recent Labs  Lab 02/06/18 1037 02/07/18 0826  NA 139 140   K 3.6 3.9  CL 105 104  CO2 24 24  GLUCOSE 135* 128*  BUN 23* 24*  CREATININE 1.97* 1.82*  CALCIUM 8.4* 9.0  MG  --  2.0  PHOS  --  3.8    Lipid Panel:     Component Value Date/Time   CHOL 123 02/06/2018 1037   TRIG 238 (H) 02/06/2018 1037   HDL 29 (L) 02/06/2018 1037   CHOLHDL 4.2 02/06/2018 1037   VLDL 48 (H) 02/06/2018 1037   LDLCALC 46 02/06/2018 1037   HgbA1c:  Lab Results  Component Value Date   HGBA1C 6.1 (H) 02/06/2018   Urine Drug Screen:     Component Value Date/Time   LABOPIA NONE DETECTED 02/05/2018 1322   COCAINSCRNUR POSITIVE (A) 02/05/2018 1322   COCAINSCRNUR See Final Results 12/15/2015 1012   LABBENZ NONE DETECTED 02/05/2018 1322   AMPHETMU NONE DETECTED 02/05/2018 1322   THCU NONE DETECTED 02/05/2018 1322   LABBARB NONE DETECTED 02/05/2018 1322    Alcohol Level     Component Value Date/Time   ETH <10 02/05/2018 1006    IMAGING   Dg Chest Portable 1 View 02/05/2018 IMPRESSION:  1. Endotracheal tube is low at 1 cm from carina. Consider retraction by 2-3 cm.  2. NG tube in stomach  3. Low lung volumes    CT Head Wo Contrast 02/06/2018 IMPRESSION: 1. No significant interval change in size of acute left  paramedian pontomedullary hemorrhage, with similar extension into the adjacent fourth ventricle. No significant mass effect. Minimally increased ventriculomegaly as compared to previous without hydrocephalus. 2. No other new acute intracranial abnormality. 3. Interval development of acute maxillary sinusitis.    Ct Head Code Stroke Wo Contrast  02/05/2018 IMPRESSION:  1. Acute dorsal LEFT paramedian brainstem hemorrhage at the pontomedullary junction, extending into the fourth ventricle. Etiology is undetermined. Hypertension, vascular malformation, anticoagulation, unrecognized trauma, or drug use are all considerations.  2. ASPECTS is not applicable.  3. Attempts are being made to contact the on-call neurologist.    MRI / MRA Head   02/05/2018 IMPRESSION: Acute LEFT paramedian pontomedullary hemorrhage, roughly similar in size to earlier  CT, 14 x 15 x 21 mm corresponding to an approximate volume of 2-3 mL.  Given the numerous microbleeds elsewhere in the brain, as well as markedly elevated blood pressure on presentation, this abnormality is felt to most likely represent a hypertensive related hemorrhage.  Premature atrophy with chronic microvascular ischemic change.  There is mass effect on the fourth ventricle and aqueduct, and hemorrhage has extended into the adjacent ventricular system. The patient is at risk for development of hydrocephalus.  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 Internal carotid arteries are widely patent. No visible saccular aneurysm or vascular malformation.   Transthoracic Echocardiogram - Left ventricle: The cavity size was normal. There was moderate   concentric hypertrophy. Systolic function was mildly reduced. The   estimated ejection fraction was in the range of 45% to 50%. Mild   diffuse hypokinesis with no identifiable regional variations   Bilateral Carotid Dopplers - Right Carotid: Velocities in the right ICA are consistent with a 1-39% stenosis. Left Carotid: Velocities in the left ICA are consistent with a 1-39% stenosis. Vertebrals: Bilateral vertebral arteries demonstrate antegrade flow.        PHYSICAL EXAM Vitals:   02/07/18 1215 02/07/18 1230 02/07/18 1245 02/07/18 1300  BP: (!) 146/88 (!) 141/85 (!) 145/89 (!) 144/88  Pulse: 97 91 93 89  Resp: 20 20 20 20   Temp:      TempSrc:      SpO2: 100% 100% 100% 100%  Weight:      Height:       Pleasant middle-aged African-American gentleman who is sedated and intubated. . Afebrile. Head is nontraumatic. Neck is supple without bruit.    Cardiac exam no murmur or gallop. Lungs are clear to auscultation. Distal pulses are well felt. Neurological Exam :   Sedated and intubated.  Drowsy but opens eyes and follows commands.  Right gaze preference.  Left gaze palsy.  Nystagmus when he looks to the right with a. downbeat component.   .  Skew eye deviation with left eye deviated downwards.  Pupils 3 mm equal reactive.  Fundi not visualized.  Blinks to threat more on the right than the left.  Face is symmetric.  Tongue midline.  Has a good cough and gag.  Motor system exam patient is able to move all 4 extremities against gravity but moves left side more than right.  Deep tendon reflexes are 2+ symmetric.  Plantars are downgoing.  Sensation appears preserved bilaterally.  Gait not tested.  ASSESSMENT/PLAN Mr. [name] is a 52 y.o. male with history of coronary artery disease, cardiomyopathy, congestive heart failure, chronic kidney disease, hypertension, medical noncompliance, obesity, obstructive sleep apnea, previous tobacco use, and previous cocaine use presenting with a severe headache,  diplopia, gaze deviation, elevated blood pressure, and dysarthria. He did not receive IV t-PA due to hemorrhage.  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  Carotid Doppler - 1-39 stenosis blaterally  2DECHO : EF 45 -50% diffuse hypokinesis  pending  UDS - positive for cocaine  LDL - 46  HgbA1c - 6.1  VTE prophylaxis - SCDs Fall precautions Diet NPO time specified  aspirin 81 mg daily prior to admission, now on No antithrombotic  Ongoing aggressive stroke risk factor management  Therapy recommendations:  pending  Disposition:   Pending  Hypertension  BP somewhat high at times.  Systolic blood pressure goal of less than 140 for the first 24 hours.  Long-term BP goal normotensive  Hyperlipidemia  Home meds: Lipitor 80 mg daily prior to admission now on hold secondary to hemorrhage.  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  Coronary artery disease /cardiomyopathy  History of drug use - UDS positive for cocaine  CKD - BUN 23 ; creatinine 1.97  Hyperglycemia -hemoglobin A1c 6.1  Head CT 02/06/2018 - stable  Intubated  NPO -consider tube feedings if not extubated soon and able to eat.  Systolic blood pressure goal liberalized to < 180 mmHg. Home BP meds restarted.  Delton See PA-C [facility] Pager [phone] 02/07/2018, 1:08 PM   Plan / Recommendations   Continue strict blood pressure control with systolic goal below  180    Patient could be extubated   if he is able to protect his airwaya CCM feels he is ready.  Discussed with patient`s wife at the bedside and answered questions.  Discussed with Dr. Warren Lacy pulmonary critical care medicine. This patient is critically ill and at significant risk of neurological worsening, death and care requires constant monitoring of vital signs, hemodynamics,respiratory and cardiac monitoring, extensive review of multiple databases, frequent neurological assessment, discussion with family, other specialists and medical decision making of high complexity.I have made any additions or clarifications directly to the above note.This critical care time does not reflect procedure time, or teaching time or  supervisory time of PA/NP/Med Resident etc but could involve care discussion time.  I spent 35  minutes of neurocritical care time  in the care of  this patient. Delia Heady, MD Medical Director [facility] Pager: [phone] 02/07/2018 1:08 PM   02/07/2018 1:08 PM      Hospital day # 2     To contact Stroke Continuity provider, please refer to [email or web address]. After hours, contact General Neurology

## 2018-02-07 NOTE — Progress Notes (Signed)
Subjective: Patient reports patient awake and following commands  Objective: Vital signs in last 24 hours: Temp:  [98.6 F (37 C)-99.3 F (37.4 C)] 99.1 F (37.3 C) (03/19 0733) Pulse Rate:  [84-116] 106 (03/19 0738) Resp:  [0-30] 27 (03/19 0738) BP: (117-185)/(56-110) 185/110 (03/19 0745) SpO2:  [92 %-100 %] 100 % (03/19 0738) FiO2 (%):  [40 %] 40 % (03/19 0738) Weight:  [106.1 kg (233 lb 14.5 oz)] 106.1 kg (233 lb 14.5 oz) (03/19 0500)  Intake/Output from previous day: 03/18 0701 - 03/19 0700 In: 1950.5 [I.V.:1586.5; NG/GT:364] Out: 1815 [Urine:1815] Intake/Output this shift: No intake/output data recorded.  awake and following commands  Lab Results: Recent Labs    02/05/18 0649 02/05/18 0654  WBC 8.8  --   HGB 14.0 15.0  HCT 40.6 44.0  PLT 300  --    BMET Recent Labs    02/05/18 0649 02/05/18 0654 02/06/18 1037  NA 137 140 139  K 3.8 3.8 3.6  CL 102 101 105  CO2 22  --  24  GLUCOSE 193* 199* 135*  BUN 24* 26* 23*  CREATININE 1.86* 1.90* 1.97*  CALCIUM 9.0  --  8.4*    Studies/Results: Ct Head Wo Contrast  Result Date: 02/06/2018 CLINICAL DATA:  Follow-up examination for intracranial hemorrhage. EXAM: CT HEAD WITHOUT CONTRAST TECHNIQUE: Contiguous axial images were obtained from the base of the skull through the vertex without intravenous contrast. COMPARISON:  Prior CT from 02/05/2018. FINDINGS: Brain: Acute intraparenchymal hemorrhage involving the dorsal left para median medulla again seen, measuring 13 x 15 x 24 mm (estimated volume 2.3 mL). Mild localized vasogenic edema without significant regional mass effect. Intraventricular extension with blood in the fourth ventricle and cerebral aqueduct, extending into the posterior aspect of the third ventricle, similar to previous. Slightly increased dilatation of the lateral and third ventricles without hydrocephalus. No new intracranial hemorrhage. No acute large vessel territory infarct. No mass lesion or  midline shift. Underlying atrophy with chronic small vessel ischemic disease again noted. No extra-axial fluid collection. Vascular: No hyperdense vessel. Calcified atherosclerosis at the skull base. Skull: Scalp soft tissues and calvarium within normal limits. Sinuses/Orbits: Globes and orbital soft tissues within normal limits. Scattered mucosal thickening throughout the ethmoidal air cells and maxillary sinuses with superimposed air-fluid level within the right maxillary sinus. Small right mastoid effusion. Other: None. IMPRESSION: 1. No significant interval change in size of acute left paramedian pontomedullary hemorrhage, with similar extension into the adjacent fourth ventricle. No significant mass effect. Minimally increased ventriculomegaly as compared to previous without hydrocephalus. 2. No other new acute intracranial abnormality. 3. Interval development of acute maxillary sinusitis. Electronically Signed   By: Rise Mu M.D.   On: 02/06/2018 07:19   Mr Shirlee Latch WU Contrast  Result Date: 02/05/2018 CLINICAL DATA:  Severe headache and double vision, began earlier today. Blood pressure greater than 200 systolic. Severe dysarthria. EXAM: MRI HEAD WITHOUT CONTRAST MRA HEAD WITHOUT CONTRAST TECHNIQUE: Multiplanar, multiecho pulse sequences of the brain and surrounding structures were obtained without intravenous contrast. Angiographic images of the head were obtained using MRA technique without contrast. COMPARISON:  CT head earlier today. FINDINGS: MRI HEAD FINDINGS Brain: There is an acute dorsal LEFT paramedian brainstem infarct, centered at the pontomedullary junction. Cross-sectional measurements of the parenchymal hemorrhage are 14 x 15 x 21 mm, corresponding to a volume of 2-3 mL, although it is difficult to quantify the additional blood which has extended into the fourth ventricle, aqueduct, and posterior third ventricle.  There is mild surrounding brainstem edema. There is obscuration of  the CSF within the fourth ventricle, with mass effect on the distal aqueduct, and the patient is at risk for obstructive hydrocephalus. Age advanced atrophy. Premature for age moderate T2 and FLAIR hyperintensities throughout the brain, likely chronic microvascular ischemic change due to hypertension. Multiple foci of hemosiderin are seen in the RIGHT cerebral hemisphere, RIGHT thalamus, midbrain and pons, as well as RIGHT cerebellum, consistent with old hypertensive related bleeds. Vascular: Described below. Skull and upper cervical spine: Normal marrow signal. Sinuses/Orbits: Chronic sinus disease.  Negative orbits. Other: None. MRA HEAD FINDINGS The internal carotid arteries are markedly dolichoectatic but widely patent. 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 There is a 75% stenosis of the distal LEFT vertebral artery in its V4 segment. The RIGHT vertebral appears to contribute only to PICA. No visible cerebellar branch occlusion. There is no visible vascular malformation or saccular aneurysm. IMPRESSION: Acute LEFT paramedian pontomedullary hemorrhage, roughly similar in size to earlier CT, 14 x 15 x 21 mm corresponding to an approximate volume of 2-3 mL. Given the numerous microbleeds elsewhere in the brain, as well as markedly elevated blood pressure on presentation, this abnormality is felt to most likely represent a hypertensive related hemorrhage. Premature atrophy with chronic microvascular ischemic change. There is mass effect on the fourth ventricle and aqueduct, and hemorrhage has extended into the adjacent ventricular system. The patient is at risk for development of hydrocephalus. 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 Internal carotid arteries are widely patent. No visible saccular aneurysm or vascular malformation. Electronically Signed   By: Elsie Stain M.D.   On:  02/05/2018 10:42   Mr Brain Wo Contrast  Result Date: 02/05/2018 CLINICAL DATA:  Severe headache and double vision, began earlier today. Blood pressure greater than 200 systolic. Severe dysarthria. EXAM: MRI HEAD WITHOUT CONTRAST MRA HEAD WITHOUT CONTRAST TECHNIQUE: Multiplanar, multiecho pulse sequences of the brain and surrounding structures were obtained without intravenous contrast. Angiographic images of the head were obtained using MRA technique without contrast. COMPARISON:  CT head earlier today. FINDINGS: MRI HEAD FINDINGS Brain: There is an acute dorsal LEFT paramedian brainstem infarct, centered at the pontomedullary junction. Cross-sectional measurements of the parenchymal hemorrhage are 14 x 15 x 21 mm, corresponding to a volume of 2-3 mL, although it is difficult to quantify the additional blood which has extended into the fourth ventricle, aqueduct, and posterior third ventricle. There is mild surrounding brainstem edema. There is obscuration of the CSF within the fourth ventricle, with mass effect on the distal aqueduct, and the patient is at risk for obstructive hydrocephalus. Age advanced atrophy. Premature for age moderate T2 and FLAIR hyperintensities throughout the brain, likely chronic microvascular ischemic change due to hypertension. Multiple foci of hemosiderin are seen in the RIGHT cerebral hemisphere, RIGHT thalamus, midbrain and pons, as well as RIGHT cerebellum, consistent with old hypertensive related bleeds. Vascular: Described below. Skull and upper cervical spine: Normal marrow signal. Sinuses/Orbits: Chronic sinus disease.  Negative orbits. Other: None. MRA HEAD FINDINGS The internal carotid arteries are markedly dolichoectatic but widely patent. 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 There is a 75% stenosis of the distal LEFT vertebral artery in its V4 segment. The RIGHT vertebral appears to contribute  only to PICA. No visible cerebellar branch occlusion.  There is no visible vascular malformation or saccular aneurysm. IMPRESSION: Acute LEFT paramedian pontomedullary hemorrhage, roughly similar in size to earlier CT, 14 x 15 x 21 mm corresponding to an approximate volume of 2-3 mL. Given the numerous microbleeds elsewhere in the brain, as well as markedly elevated blood pressure on presentation, this abnormality is felt to most likely represent a hypertensive related hemorrhage. Premature atrophy with chronic microvascular ischemic change. There is mass effect on the fourth ventricle and aqueduct, and hemorrhage has extended into the adjacent ventricular system. The patient is at risk for development of hydrocephalus. 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 Internal carotid arteries are widely patent. No visible saccular aneurysm or vascular malformation. Electronically Signed   By: Elsie Stain M.D.   On: 02/05/2018 10:42   Dg Chest Portable 1 View  Result Date: 02/05/2018 CLINICAL DATA:  Stroke. EXAM: PORTABLE CHEST 1 VIEW COMPARISON:  12/21/2017 FINDINGS: Endotracheal tube is 1 cm from carina. Stable enlarged cardiac silhouette. No effusion, infiltrate pneumothorax. Low lung volumes. NG tube extends the stomach. IMPRESSION: 1. Endotracheal tube is low at 1 cm from carina. Consider retraction by 2-3 cm. 2. NG tube in stomach 3. Low lung volumes Electronically Signed   By: Genevive Bi M.D.   On: 02/05/2018 08:33    Assessment/Plan: Patient doing well neurologically follows commands will hopefully extubate per primary team today.  LOS: 2 days     Oseph Imburgia P 02/07/2018, 7:56 AM

## 2018-02-07 NOTE — Progress Notes (Signed)
PT Cancellation Note  Patient Details Name: [name] MRN: [id number] DOB: 01/21/1966   Cancelled Treatment:    Reason Eval/Treat Not Completed: Patient not medically ready;Active bedrest order. Will check back later today to see if pt is ready.    Averianna Brugger L Yoseline Andersson 02/07/2018, 8:32 AM

## 2018-02-07 NOTE — Consult Note (Signed)
Physical Medicine and Rehabilitation Consult   Reason for Consult: Functional deficits due to stroke Referring Physician: Dr. Pearlean Brownie   HPI: [name] is a 52 y.o. male with history of CAD, hyperaldosteronism, CKD, HTN, NICM, recent R-Knee Fx?,  medication noncompliance;  who was admitted on 02/05/2018 with headache double vision and dysarthria.  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  UDS was positive for cocaine. He was started on Cardene drip due to elevated blood pressure.  MRI MRA of the brain showed acute left paramedian pontomedullary hemorrhage roughly same size, mass-effect on fourth ventricle and aqueduct with concerns for development of hydrocephalus, 75% stenosis of distal left vertebral artery and no saccular aneurysm or vascular malformation.  Repeat CT of head 3/18  showed no significant increase in size of bleed.  Carotid Doppler showed no significant ICA stenosis. 2D echo showed EF of 45-50% with mild diffuse hypokinesis no thrombus moderately dilated left atrium.  Dr. Wynetta Emery was consulted for input and felt no surgical intervention needed and patient tolerated extubation by 3/19.  Dr. 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  Had difficulty with vent wean. Therapy evaluations done yesterday revealing right gaze preference with left neglect, double vision and poor sitting balance.  CIR recommended  for follow-up therapy due to functional deficits.    Patient has severe dysarthria with hypophonia.  Patient states that he is able to swallow his secretions.  Does perform head nods inconsistently.  Review of Systems  Unable to perform ROS: Other     Past Medical History:  Diagnosis Date  . CAD (coronary artery disease) 02/18/2009   Small OM occlusion with collaterals, 60% RCA, 40% LAD. April 2015    . Cardiomyopathy, nonischemic  (HCC) 05/03/2016  . CHF (congestive heart failure) (HCC)   . CKD (chronic kidney disease)   . History of syncope 08/2008   Secondary to hypertension  . Hypertension, essential, benign    a. renal art [place] (6/14): no RAS  . Hypokalemia 05/03/2016  . Noncompliance   . Nonischemic cardiomyopathy (HCC)    EF 45%  . Obesity   . Sleep apnea   . Tobacco abuse     Past Surgical History:  Procedure Laterality Date  . CARDIAC CATHETERIZATION N/A 12/22/2015   Procedure: Left Heart Cath and Coronary Angiography;  Surgeon: Lyn Records, MD;  Location: [facility] INVASIVE CV LAB;  Service: Cardiovascular;  Laterality: N/A;  . IR US GUIDE VASC ACCESS RIGHT  04/14/2017  . IR VENOGRAM ADRENAL BI  04/14/2017  . IR VENOGRAM RENAL BI  04/14/2017  . IR VENOUS SAMPLING  04/14/2017  . IR VENOUS SAMPLING  04/14/2017  . LEFT HEART CATHETERIZATION WITH CORONARY ANGIOGRAM N/A 02/21/2014   Procedure: LEFT HEART CATHETERIZATION WITH CORONARY ANGIOGRAM;  Surgeon: Kathleene Hazel, MD;  Location: [facility] CATH LAB;  Service: Cardiovascular;  Laterality: N/A;  . None      Family History  Problem Relation Age of Onset  . Lupus Mother   . Heart disease Maternal Grandfather   . Coronary artery disease Neg Hx   . Adrenal disorder Neg Hx     Social History: Lives with girlfriend. Is a disabled cook.  Girlfriend is a Lawyer. Per reports that he has quit smoking. His smoking use included cigarettes. He has a 3.20 pack-year smoking history. he has never used smokeless tobacco.  Per reports he does not drink alcohol or use drugs.    Allergies  Allergen Reactions  . Penicillins Other (See Comments)    Makes his throat itch really bad, and feels like it is closing up.  Has patient had a PCN reaction causing immediate rash, facial/tongue/throat swelling, SOB or lightheadedness with hypotension: Yes Has patient had a PCN reaction causing severe rash involving mucus membranes or skin necrosis: No Has patient had a PCN reaction that  required hospitalization: Yes Has patient had a PCN reaction occurring within the last 10 years: No If all of the above answers are "NO", then may proceed with Cephalospo  . Shrimp [Shellfish Allergy]     Throat swells    Medications Prior to Admission  Medication Sig Dispense Refill  . acetaminophen-codeine (TYLENOL #3) 300-30 MG tablet Take 1 tablet by mouth every 6 (six) hours as needed for moderate pain. 15 tablet 0  . amLODipine (NORVASC) 10 MG tablet Take 1 tablet (10 mg total) by mouth daily. 30 tablet 0  . aspirin EC 81 MG tablet Take 1 tablet (81 mg total) by mouth daily. 90 tablet 3  . atorvastatin (LIPITOR) 80 MG tablet Take 1 tablet (80 mg total) by mouth daily at 6 PM. 30 tablet 0  . furosemide (LASIX) 40 MG tablet Take 1 tablet (40 mg total) by mouth daily. 30 tablet 0  . hydrALAZINE (APRESOLINE) 100 MG tablet Take 1 tablet (100 mg total) by mouth 3 (three) times daily. 90 tablet 0  . HYDROcodone-acetaminophen (NORCO/VICODIN) 5-325 MG tablet Take 2 tablets by mouth every 6 (six) hours as needed. (Patient taking differently: Take 2 tablets by mouth every 6 (six) hours as needed for moderate pain. ) 16 tablet 0  . isosorbide mononitrate (IMDUR) 60 MG 24 hr tablet Take 1 tablet (60 mg total) by mouth daily. 30 tablet 0  . labetalol (NORMODYNE) 200 MG tablet Take 2 tablets (400 mg total) by mouth 2 (two) times daily. 90 tablet 0  . nitroGLYCERIN (NITROSTAT) 0.4 MG SL tablet Place 1 tablet (0.4 mg total) under the tongue every 5 (five) minutes as needed. For chest pain 10 tablet 0  . potassium chloride SA (K-DUR,KLOR-CON) 20 MEQ tablet Take 2 tablets (40 mEq total) by mouth daily. 60 tablet 0  . spironolactone (ALDACTONE) 50 MG tablet Take 1 tablet (50 mg total) by mouth daily. 30 tablet 0    Home: Home Living Family/patient expects to be discharged to:: Private residence Living Arrangements: Spouse/significant other Available Help at Discharge: Family, Available 24  hours/day Type of Home: House Home Access: Ramped entrance Home Layout: Two level, Able to live on main level with bedroom/bathroom Bathroom Shower/Tub: Health visitor: Handicapped height Home Equipment: None Additional Comments: Planning to dc home with brother and have family switch in and out for 24 hour support  Functional History: Prior Function Level of Independence: Independent Comments: ADLs and IADLs. Not driving and working and on disability Functional Status:  Mobility: Bed Mobility Overal bed mobility: Needs Assistance Bed Mobility: Supine to Sit, Sit to Supine Supine to sit: Min assist, +2 for safety/equipment, HOB elevated Sit to supine: Mod assist, +2 for physical assistance General bed mobility comments: Min A to elevate trunk into sitting. Pt able to transition hips towards EOB with Min ARequiring Mod A to bring BLEs over EOB and facilatite safe trunk descent.  Transfers Overall transfer level: Needs assistance Equipment used: None Transfers: Lateral/Scoot Transfers  Lateral/Scoot Transfers: Min assist, +2 safety/equipment General  transfer comment: scooted to the right along the edge of bed with feet on floor. Min A given Ambulation/Gait General Gait Details: NT    ADL: ADL Overall ADL's : Needs assistance/impaired Eating/Feeding: NPO Grooming: Wash/dry face, Min guard, Sitting Grooming Details (indicate cue type and reason): Pt able to wash his face while sitting at EOB. demonstrating good safety awarness to avoid trach while performing task.  Upper Body Bathing: Moderate assistance, Sitting Lower Body Bathing: Maximal assistance, Bed level Lower Body Bathing Details (indicate cue type and reason): Due to intubation, pt requiring Max A for LB bathing at bed level Upper Body Dressing : Moderate assistance, Sitting Lower Body Dressing: Maximal assistance, Bed level Lower Body Dressing Details (indicate cue type and reason): donning socks at bed  level. Pt able elevate BLEs off bed to assist with donning socks General ADL Comments: Pt performing bed mobility to EOB and then completing simple grooming task and BUE excercises  Cognition: Cognition Overall Cognitive Status: Difficult to assess Orientation Level: Intubated/Tracheostomy - Unable to assess Cognition Arousal/Alertness: Awake/alert Behavior During Therapy: WFL for tasks assessed/performed Overall Cognitive Status: Difficult to assess General Comments: Pt following cues throughout session. Maintaining attention and demonstrating good safety awarness to participate in BADLs while sitting EOB on vent.  Difficult to assess due to: Intubated   Blood pressure (!) 170/75, pulse 96, temperature 99.6 F (37.6 C), temperature source Axillary, resp. rate (!) 25, height 5\' 9"  (1.753 m), weight 105.8 kg (233 lb 4 oz), SpO2 98 %. Physical Exam  Nursing note and vitals reviewed. Constitutional: He appears well-developed and well-nourished. He appears lethargic. He is easily aroused. No distress. He is sedated and intubated.  Easily aroused. Intubated with OG tube in place.   HENT:  Head: Normocephalic and atraumatic.  Eyes: Conjunctivae are normal. Pupils are equal, round, and reactive to light.  Cardiovascular: Normal rate and regular rhythm.  Respiratory: Effort normal and breath sounds normal. No stridor. He is intubated. No respiratory distress. He has no wheezes.  GI: Soft. Bowel sounds are normal. He exhibits no distension. There is no tenderness.  Musculoskeletal: He exhibits no edema.  Mild discomfort with ROM due to recent fracture (per aunt). He indicated that he has been non-compliant with knee brace.   Neurological: He is easily aroused. He appears lethargic.  Limited due to sedation and intubated state. He was able to nod appropriately to basic biographic questions.  Able to move all four.   Skin: Skin is warm and dry. He is not diaphoretic.  Left cranial nerve VI, left  cranial nerve VII palsy.  Internuclear ophthalmoplegia Motor strength is 4/5 bilateral deltoid bicep tricep grip 3- bilateral hip flexor and extensor 4 at the ankle dorsiflexion plantarflexion Sensation intact light touch bilateral upper and lower limbs There is right pupillary deviation, unable to abduct left eye and unable to adduct right eye Tone is normal Left facial droop Results for orders placed or performed during the hospital encounter of 02/05/18 (from the past 24 hour(s))  Glucose, capillary     Status: Abnormal   Collection Time: 02/07/18 11:27 AM  Result Value Ref Range   Glucose-Capillary 106 (H) 65 - 99 mg/dL   Comment 1 Notify RN    Comment 2 Document in Chart   Glucose, capillary     Status: Abnormal   Collection Time: 02/07/18  3:51 PM  Result Value Ref Range   Glucose-Capillary 119 (H) 65 - 99 mg/dL   Comment 1 Notify RN  Comment 2 Document in Chart   Magnesium     Status: None   Collection Time: 02/07/18  5:01 PM  Result Value Ref Range   Magnesium 2.0 1.7 - 2.4 mg/dL  Phosphorus     Status: None   Collection Time: 02/07/18  5:01 PM  Result Value Ref Range   Phosphorus 3.9 2.5 - 4.6 mg/dL  Glucose, capillary     Status: None   Collection Time: 02/07/18  7:52 PM  Result Value Ref Range   Glucose-Capillary 96 65 - 99 mg/dL  Glucose, capillary     Status: Abnormal   Collection Time: 02/07/18 11:29 PM  Result Value Ref Range   Glucose-Capillary 135 (H) 65 - 99 mg/dL  Glucose, capillary     Status: Abnormal   Collection Time: 02/08/18  3:27 AM  Result Value Ref Range   Glucose-Capillary 117 (H) 65 - 99 mg/dL  Magnesium     Status: None   Collection Time: 02/08/18  4:23 AM  Result Value Ref Range   Magnesium 2.2 1.7 - 2.4 mg/dL  Phosphorus     Status: None   Collection Time: 02/08/18  4:23 AM  Result Value Ref Range   Phosphorus 4.3 2.5 - 4.6 mg/dL  CBC     Status: Abnormal   Collection Time: 02/08/18  4:23 AM  Result Value Ref Range   WBC 8.2 4.0 -  10.5 K/uL   RBC 3.93 (L) 4.22 - 5.81 MIL/uL   Hemoglobin 11.9 (L) 13.0 - 17.0 g/dL   HCT 98.1 (L) 19.1 - 47.8 %   MCV 94.9 78.0 - 100.0 fL   MCH 30.3 26.0 - 34.0 pg   MCHC 31.9 30.0 - 36.0 g/dL   RDW 29.5 62.1 - 30.8 %   Platelets 257 150 - 400 K/uL  Basic metabolic panel     Status: Abnormal   Collection Time: 02/08/18  4:23 AM  Result Value Ref Range   Sodium 143 135 - 145 mmol/L   Potassium 4.1 3.5 - 5.1 mmol/L   Chloride 108 101 - 111 mmol/L   CO2 25 22 - 32 mmol/L   Glucose, Bld 115 (H) 65 - 99 mg/dL   BUN 38 (H) 6 - 20 mg/dL   Creatinine, Ser 6.57 (H) 0.61 - 1.24 mg/dL   Calcium 9.2 8.9 - 84.6 mg/dL   GFR calc non Af Amer 37 (L) >60 mL/min   GFR calc Af Amer 43 (L) >60 mL/min   Anion gap 10 5 - 15  Glucose, capillary     Status: Abnormal   Collection Time: 02/08/18  7:50 AM  Result Value Ref Range   Glucose-Capillary 126 (H) 65 - 99 mg/dL   Comment 1 Notify RN    Comment 2 Document in Chart    Dg Chest Port 1 View  Result Date: 02/08/2018 CLINICAL DATA:  Respiratory failure EXAM: PORTABLE CHEST 1 VIEW COMPARISON:  02/07/2018 FINDINGS: Cardiac shadow remains enlarged. Endotracheal tube and nasogastric catheter are again noted in satisfactory position. Bibasilar atelectatic changes are again noted and stable. No new focal abnormality is seen. IMPRESSION: No significant interval change from the prior exam. Electronically Signed   By: Alcide Clever M.D.   On: 02/08/2018 08:42   Dg Chest Port 1 View  Result Date: 02/07/2018 CLINICAL DATA:  Shortness of breath. EXAM: PORTABLE CHEST 1 VIEW COMPARISON:  Chest x-ray dated February 05, 2018. FINDINGS: Slight interval retraction of the endotracheal tube, now 1.9 cm above the carina. The enteric  tube is unchanged. Stable cardiomegaly. Unchanged low lung volumes with bibasilar atelectasis. No pneumothorax or large pleural effusion. No acute osseous abnormality. IMPRESSION: 1. Slight interval retraction of the endotracheal tube, now  approximately 1.9 cm above the carina. 2. Persistent lung volumes with bibasilar atelectasis. Electronically Signed   By: Obie Dredge M.D.   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 Does the need for close, 24 hr/day medical supervision in concert with the patient's rehab needs make it unreasonable for this patient to be served in a less intensive setting? Yes 2. Co-Morbidities requiring supervision/potential complications: Cocaine abuse, chronic kidney disease, coronary artery disease, nonischemic cardiomyopathy, hypertension 3. Due to bladder management, bowel management, safety, skin/wound care, disease management, medication administration, pain management and patient education, does the patient require 24 hr/day rehab nursing? Yes 4. Does the patient require coordinated care of a physician, rehab nurse, PT (1-2 hrs/day, 5 days/week), OT (1-2 hrs/day, 5 days/week) and SLP (.5-1 hrs/day, 5 days/week) to address physical and functional deficits in the context of the above medical diagnosis(es)? Yes Addressing deficits in the following areas: balance, endurance, locomotion, strength, transferring, bowel/bladder control, bathing, dressing, feeding, grooming, toileting, cognition, speech, language, swallowing and psychosocial support 5. Can the patient actively participate in an intensive therapy program of at least 3 hrs of therapy per day at least 5 days per week? No 6. The potential for patient to make measurable gains while on inpatient rehab is excellent 7. Anticipated functional outcomes upon discharge from inpatient rehab are supervision and min assist  with PT, modified independent and supervision with OT, supervision and min assist with SLP. 8. Estimated rehab length of stay to reach the above functional goals is: 18-22d 9.  10. Anticipated D/C setting: Home 11. Anticipated post D/C  treatments: HH therapy 12. Overall Rehab/Functional Prognosis: excellent  RECOMMENDATIONS: This patient's condition is appropriate for continued rehabilitative care in the following setting: CIR Patient has agreed to participate in recommended program. Yes Note that insurance prior authorization may be required for reimbursement for recommended care.  Comment: Currently not able to tolerate inpatient rehab but should be able to do so in the next couple days my admission coordinator to monitor progress  Erick Colace M.D. [facility] FAAPM&R (Sports Med, Neuromuscular Med) Diplomate Am Board of Electrodiagnostic Med Fellow American Board of interventional pain physicians Jacquelynn Cree, PA-C 02/08/2018

## 2018-02-08 ENCOUNTER — Inpatient Hospital Stay (HOSPITAL_COMMUNITY): Payer: Medicaid Other

## 2018-02-08 DIAGNOSIS — I69322 Dysarthria following cerebral infarction: Secondary | ICD-10-CM

## 2018-02-08 DIAGNOSIS — I69391 Dysphagia following cerebral infarction: Secondary | ICD-10-CM

## 2018-02-08 DIAGNOSIS — I613 Nontraumatic intracerebral hemorrhage in brain stem: Principal | ICD-10-CM

## 2018-02-08 DIAGNOSIS — I69398 Other sequelae of cerebral infarction: Secondary | ICD-10-CM

## 2018-02-08 DIAGNOSIS — R269 Unspecified abnormalities of gait and mobility: Secondary | ICD-10-CM

## 2018-02-08 LAB — GLUCOSE, CAPILLARY
GLUCOSE-CAPILLARY: 117 mg/dL — AB (ref 65–99)
GLUCOSE-CAPILLARY: 126 mg/dL — AB (ref 65–99)
GLUCOSE-CAPILLARY: 99 mg/dL (ref 65–99)
GLUCOSE-CAPILLARY: 94 mg/dL (ref 65–99)
Glucose-Capillary: 117 mg/dL — ABNORMAL HIGH (ref 65–99)
Glucose-Capillary: 92 mg/dL (ref 65–99)

## 2018-02-08 LAB — CBC
HCT: 37.3 % — ABNORMAL LOW (ref 39.0–52.0)
HEMOGLOBIN: 11.9 g/dL — AB (ref 13.0–17.0)
MCH: 30.3 pg (ref 26.0–34.0)
MCHC: 31.9 g/dL (ref 30.0–36.0)
MCV: 94.9 fL (ref 78.0–100.0)
PLATELETS: 257 10*3/uL (ref 150–400)
RBC: 3.93 MIL/uL — AB (ref 4.22–5.81)
RDW: 14.7 % (ref 11.5–15.5)
WBC: 8.2 10*3/uL (ref 4.0–10.5)

## 2018-02-08 LAB — BASIC METABOLIC PANEL
Anion gap: 10 (ref 5–15)
BUN: 38 mg/dL — AB (ref 6–20)
CHLORIDE: 108 mmol/L (ref 101–111)
CO2: 25 mmol/L (ref 22–32)
CREATININE: 1.98 mg/dL — AB (ref 0.61–1.24)
Calcium: 9.2 mg/dL (ref 8.9–10.3)
GFR calc Af Amer: 43 mL/min — ABNORMAL LOW (ref >60)
GFR calc non Af Amer: 37 mL/min — ABNORMAL LOW (ref >60)
GLUCOSE: 115 mg/dL — AB (ref 65–99)
POTASSIUM: 4.1 mmol/L (ref 3.5–5.1)
Sodium: 143 mmol/L (ref 135–145)

## 2018-02-08 LAB — TRIGLYCERIDES: Triglycerides: 161 mg/dL — ABNORMAL HIGH (ref <150)

## 2018-02-08 LAB — PHOSPHORUS
Phosphorus: 4.3 mg/dL (ref 2.5–4.6)
Phosphorus: 5.5 mg/dL — ABNORMAL HIGH (ref 2.5–4.6)

## 2018-02-08 LAB — MAGNESIUM
MAGNESIUM: 2.2 mg/dL (ref 1.7–2.4)
Magnesium: 2.3 mg/dL (ref 1.7–2.4)

## 2018-02-08 MED ORDER — LISINOPRIL 2.5 MG PO TABS
2.5000 mg | ORAL_TABLET | Freq: Every day | ORAL | Status: DC
  Administered 2018-02-08: 2.5 mg via ORAL
  Filled 2018-02-08: qty 1

## 2018-02-08 MED ORDER — ORAL CARE MOUTH RINSE: 15.0000 mL | Freq: Two times a day (BID) | OROMUCOSAL | Status: DC

## 2018-02-08 NOTE — Progress Notes (Signed)
Physical Therapy Treatment Patient Details Name: [name] MRN: [id number] DOB: 20-May-1966 Today's Date: 02/08/2018    History of Present Illness [name] is an 52 y.o. male past medical history of coronary  Artery disease, chronic heart failure, CKD, hypertension presents to [facility] emergency room . Pt presented with HA and double vision. 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     PT Comments    Patient seen for activity progression, tolerated EOB and OOB to chair activity today. Patient eager to progress. OF NOTE: per family patient was to be in [place] on RLE and NWBing, unclear if this is accurate, utilized NWBing conservative approach for mobility, anticipate patient would have required less physical    Follow Up Recommendations  CIR     Equipment Recommendations  Other (comment)(TBD)    Recommendations for Other Services Rehab consult     Precautions / Restrictions Precautions Precautions: Other (comment);Fall(vent) Restrictions Weight Bearing Restrictions: Yes RLE Weight Bearing: Non weight bearing Other Position/Activity Restrictions: Per family, NWB with KI at RLE due to recent injury before admission. Awaiting MD clearance for WBing status. Use conservative protocal.     Mobility  Bed Mobility Overal bed mobility: Needs Assistance Bed Mobility: Supine to Sit;Sit to Supine     Supine to sit: +2 for safety/equipment;Min guard     General bed mobility comments: Min Guard for safety  Transfers Overall transfer level: Needs assistance Equipment used: Rolling walker (2 wheeled) Transfers: Sit to/from [employer] Sit to Stand: Max assist;+2 physical assistance Stand pivot transfers: Max assist;+2 physical assistance       General transfer comment: Pt requiring max verbal and tactile cues for placing LUE on RW. Requiring Max A +2 for balance during transfers. Maintaining NWB status until cleared for RLE at baseline.    Ambulation/Gait                 Stairs            Wheelchair Mobility    Modified Rankin (Stroke Patients Only)       Balance Overall balance assessment: Needs assistance Sitting-balance support: No upper extremity supported;Feet unsupported Sitting balance-Leahy Scale: Fair Sitting balance - Comments: Pt maintaining sitting balance at EOB without LOB during grooming and excercises   Standing balance support: Bilateral upper extremity supported;During functional activity Standing balance-Leahy Scale: Poor Standing balance comment: Reliant on UE support and physical A                            Cognition Arousal/Alertness: Awake/alert Behavior During Therapy: WFL for tasks assessed/performed Overall Cognitive Status: Within Functional Limits for tasks assessed                                 General Comments: Continue to follow cues and present with [facility] for tasks completed. Will continue to assess      Exercises      General Comments General comments (skin integrity, edema, etc.): HR 134 during acitivity. Family present during session      Pertinent Vitals/Pain Pain Assessment: Faces Faces Pain Scale: No hurt Pain Intervention(s): Monitored during session    Home Living                      Prior Function            PT Goals (current goals  can now be found in the care plan section) Acute Rehab PT Goals Patient Stated Goal: Go home PT Goal Formulation: With patient Time For Goal Achievement: 02/21/18 Potential to Achieve Goals: Good Progress towards PT goals: Progressing toward goals    Frequency    Min 4X/week      PT Plan Current plan remains appropriate    Co-evaluation PT/OT/SLP Co-Evaluation/Treatment: Yes Reason for Co-Treatment: Complexity of the patient's impairments (multi-system involvement);To address functional/ADL transfers PT goals addressed during session: Mobility/safety with  mobility OT goals addressed during session: ADL's and self-care      AM-PAC PT "6 Clicks" Daily Activity  Outcome Measure  Difficulty turning over in bed (including adjusting bedclothes, sheets and blankets)?: Unable Difficulty moving from lying on back to sitting on the side of the bed? : Unable Difficulty sitting down on and standing up from a chair with arms (e.g., wheelchair, bedside commode, etc,.)?: Unable Help needed moving to and from a bed to chair (including a wheelchair)?: A Lot Help needed walking in hospital room?: Total Help needed climbing 3-5 steps with a railing? : Total 6 Click Score: 7    End of Session Equipment Utilized During Treatment: Gait belt;Oxygen Activity Tolerance: Patient tolerated treatment well Patient left: in chair;with call bell/phone within reach Nurse Communication: Mobility status PT Visit Diagnosis: Other abnormalities of gait and mobility (R26.89);Pain;Dizziness and giddiness (R42);Other symptoms and signs involving the nervous system (R29.898) Pain - part of body: (throat)     Time: 1549-1610 PT Time Calculation (min) (ACUTE ONLY): 21 min  Charges:  $Therapeutic Activity: 8-22 mins                    G Codes:       Charlotte Crumb, PT DPT  Board Certified Neurologic Specialist [phone]    Fabio Asa 02/08/2018, 6:19 PM

## 2018-02-08 NOTE — Progress Notes (Signed)
Patients urethral catheter was removed @ 1845 per MD order from Dr. Wallace Cullens. Patient tolerated removal well and is expected to void by 0045 (six hours after removal). Will continue to monitor patient at this time.

## 2018-02-08 NOTE — Procedures (Signed)
Extubation Procedure Note  Patient Details:   Name: [name] DOB: June 14, 1966 MRN: [id number]   Airway Documentation:  Airway 7.5 mm (Active)  Secured at (cm) 25 cm 02/08/2018  7:54 AM  Measured From Lips 02/08/2018  7:54 AM  Secured Location Right 02/08/2018  7:54 AM  Secured By [employer] 02/08/2018  7:54 AM  Tube Holder Repositioned Yes 02/08/2018  7:54 AM  Cuff Pressure (cm H2O) 26 cm H2O 02/08/2018  7:54 AM  Site Condition Dry 02/08/2018  7:54 AM    Evaluation  O2 sats: stable throughout Complications: No apparent complications Patient did tolerate procedure well. Bilateral Breath Sounds: Clear, Diminished   Yes  Placed on 4l/min [place] Incentive spirometer instructed and achieved  [name] 02/08/2018, 11:20 AM

## 2018-02-08 NOTE — Progress Notes (Signed)
PULMONARY / CRITICAL CARE MEDICINE   Name: [name] MRN: [id number] DOB: 1966/10/17    ADMISSION DATE:  02/05/2018  CHIEF COMPLAINT:  Diplopia and dysarthria  HISTORY OF PRESENT ILLNESS:   This is a 52 year old with a past medical history of nonischemic cardiomyopathy, congestive heart failure, and hypertension who on previous visits to the emergency room his screen positive for cocaine.  He presented with extreme hypertension headache, diplopia, and severe dysarthria, so severe that he required intubation for maintenance of his airway.  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  A repeat CT scan has not shown progression of hydrocephalus or of the bleed.    Failed SBT after about 30 minutes. ( Less time than 3/18) Per nursing he required more sedation while weaning than when on full vent support due to tachypnea. Repeat CT scan 3/18  shows no progression of his pontomedullary hemorrhage, his urine drug screen was positive for cocaine   SUBJECTIVE:  He is sedated on 0.5 mcg of Precedex this morning and calmly interactive.  I switched him to CPAP with a pressure support of 7 during my visit and he is denying dyspnea.  He is denying diplopia or dizziness.  VITAL SIGNS: BP (!) 170/75   Pulse 75   Temp 99.1 F (37.3 C) (Axillary)   Resp (!) 21   Ht 5\' 9"  (1.753 m)   Wt 233 lb 4 oz (105.8 kg)   SpO2 100%   BMI 34.44 kg/m   HEMODYNAMICS:    VENTILATOR SETTINGS: Vent Mode: PRVC FiO2 (%):  [40 %] 40 % Set Rate:  [20 bmp] 20 bmp Vt Set:  [530 mL] 530 mL PEEP:  [5 cmH20] 5 cmH20 Plateau Pressure:  [19 cmH20-25 cmH20] 19 cmH20  INTAKE / OUTPUT: I/O last 3 completed shifts: In: 3347.8 [I.V.:2612.8; NG/GT:735] Out: 2700 [Urine:2700]  PHYSICAL EXAMINATION: General: Orally intubated and mechanically ventilated, follows commands. Neuro: Spontaneous eye opening and rapidly following instructions.  Nodding to  questions.  Pupils are equal, gaze is slightly disconjugate.  He moves all fours on request. Cardiovascular: S1 and S2 are regular without murmur rub or gallop no dependent edema noted.   Lungs: Respirations are unlabored, there is symmetric air movement, no wheezes.  The lungs are clear anteriorly.   Abdomen: The abdomen obese,  soft , NT without any organomegaly masses or tenderness.  LABS:  BMET Recent Labs  Lab 02/06/18 1037 02/07/18 0826 02/08/18 0423  NA 139 140 143  K 3.6 3.9 4.1  CL 105 104 108  CO2 24 24 25   BUN 23* 24* 38*  CREATININE 1.97* 1.82* 1.98*  GLUCOSE 135* 128* 115*    Electrolytes Recent Labs  Lab 02/06/18 1037 02/07/18 0826 02/07/18 1701 02/08/18 0423  CALCIUM 8.4* 9.0  --  9.2  MG  --  2.0 2.0 2.2  PHOS  --  3.8 3.9 4.3    CBC Recent Labs  Lab 02/05/18 0649 02/05/18 0654 02/07/18 0826 02/08/18 0423  WBC 8.8  --  9.1 8.2  HGB 14.0 15.0 12.0* 11.9*  HCT 40.6 44.0 37.3* 37.3*  PLT 300  --  290 257    Coag's Recent Labs  Lab 02/05/18 0649  APTT 28  INR 0.93    Sepsis Markers No results for input(s): LATICACIDVEN, PROCALCITON, O2SATVEN in the last 168 hours.  ABG Recent Labs  Lab 02/05/18 0836  PHART 7.290*  PCO2ART 68.1*  PO2ART 120.0*  Liver Enzymes Recent Labs  Lab 02/05/18 0649  AST 21  ALT 34  ALKPHOS 67  BILITOT 0.7  ALBUMIN 3.9    Cardiac Enzymes No results for input(s): TROPONINI, PROBNP in the last 168 hours.  Glucose Recent Labs  Lab 02/07/18 0759 02/07/18 1127 02/07/18 1551 02/07/18 1952 02/07/18 2329 02/08/18 0327  GLUCAP 114* 106* 119* 96 135* 117*    Imaging Dg Chest Port 1 View  Result Date: 02/07/2018 CLINICAL DATA:  Shortness of breath. EXAM: PORTABLE CHEST 1 VIEW COMPARISON:  Chest x-ray dated February 05, 2018. FINDINGS: Slight interval retraction of the endotracheal tube, now 1.9 cm above the carina. The enteric tube is unchanged. Stable cardiomegaly. Unchanged low lung volumes with  bibasilar atelectasis. No pneumothorax or large pleural effusion. No acute osseous abnormality. IMPRESSION: 1. Slight interval retraction of the endotracheal tube, now approximately 1.9 cm above the carina. 2. Persistent lung volumes with bibasilar atelectasis. Electronically Signed   By: Obie Dredge M.D.   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  His tox screen on admission was positive for cocaine   ASSESSMENT / PLAN:  PULMONARY A: Chest x-ray this morning is showing perhaps some subtly increased markings in the right lower lobe but no overt infiltrate.  He is tolerating a spontaneous breathing trial this morning on Precedex.  I am anticipating extubation later this morning if the spontaneous breathing trial as well tolerated  CARDIOVASCULAR A: He has a history of nonischemic cardiomyopathy.  Blood pressure is 148/82. He is already afterload reduced with hydralazine, his creatinine is stable and I have introduced a very modest dose of lisinopril.  I have discontinued his scheduled potassium repletion as I am concerned with a combination of lisinopril and Spironolactone.  He will need counseling regarding continued cocaine use.   Renal Elevated Creatinine Plan: Creatinine is stable for now but will need to continue to be monitored as I introduce an ACE inhibitor.   Infectious No indication of active infection at this time    NEUROLOGIC A: No progression of neurological deficits. Continue Neuro checks per unit protocol Follow up imaging per neuro or for clinical change   Penny Pia, MD Pulmonary and Critical Care Medicine [facility] Pager: [phone]  02/08/2018, 7:44 AM

## 2018-02-08 NOTE — Progress Notes (Signed)
Occupational Therapy Treatment Patient Details Name: [name] MRN: [id number] DOB: Oct 13, 1966 Today's Date: 02/08/2018    History of present illness [name] is an 52 y.o. male past medical history of coronary  Artery disease, chronic heart failure, CKD, hypertension presents to [facility] emergency room . Pt presented with HA and double vision. 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    OT comments  Pt progressing towards established OT goals. Pt highly motivated to participate in therapy. Pt performing standing pivot transfer with Max A +2 and RW; adhering to NWB status at RLE until cleared by MD. Providing pt with visual occlusion glasses and pt reporting a transition from seeing four imaged to one image. Pt performing self feeding demonstrating increased functional performance. However, continue to present with decreased depth perception, coordination, and proprioception. Continue to recommend dc to CIR for intensive OT and will continue to follow acutely as admitted.    Follow Up Recommendations  CIR;Supervision/Assistance - 24 hour    Equipment Recommendations  Other (comment)(Defer to next venue)    Recommendations for Other Services Rehab consult;PT consult;Speech consult    Precautions / Restrictions Precautions Precautions: Other (comment);Fall(vent) Restrictions Weight Bearing Restrictions: Yes RLE Weight Bearing: Non weight bearing Other Position/Activity Restrictions: Per family, NWB with KI at RLE due to recent injury before admission. Awaiting MD clearance for WBing status. Use conservative protocal.        Mobility Bed Mobility Overal bed mobility: Needs Assistance Bed Mobility: Supine to Sit;Sit to Supine     Supine to sit: +2 for safety/equipment;Min guard     General bed mobility comments: Min Guard for safety  Transfers Overall transfer level: Needs assistance Equipment used: Rolling walker (2 wheeled) Transfers: Sit to/from [employer] Sit to Stand: Max assist;+2 physical assistance Stand pivot transfers: Max assist;+2 physical assistance       General transfer comment: Pt requiring max verbal and tactile cues for placing LUE on RW. Requiring Max A +2 for balance during transfers. Maintaining NWB status until cleared for RLE at baseline.     Balance Overall balance assessment: Needs assistance Sitting-balance support: No upper extremity supported;Feet unsupported Sitting balance-Leahy Scale: Fair Sitting balance - Comments: Pt maintaining sitting balance at EOB without LOB during grooming and excercises   Standing balance support: Bilateral upper extremity supported;During functional activity Standing balance-Leahy Scale: Poor Standing balance comment: Reliant on UE support and physical A                           ADL either performed or assessed with clinical judgement   ADL Overall ADL's : Needs assistance/impaired Eating/Feeding: Minimal assistance;Sitting Eating/Feeding Details (indicate cue type and reason): Pt requiring Min A for opening apple sauce and holding cup. Pt requiring cues to problem solve bringing spoon to mouth; overshooting to left side of mouth.  Grooming: Wash/dry face;Sitting;Set up Grooming Details (indicate cue type and reason): Wiping mouth after eating                 Toilet Transfer: Maximal assistance;+2 for physical assistance;Stand-pivot(Simualted to recliner)           Functional mobility during ADLs: Maximal assistance;+2 for physical assistance;Rolling walker(Per pt's significant other; maintaining NWB RLE ) General ADL Comments: Providing pt with occulsion glasses for diplopia. After adjusting glasses from seeing quadruple vision to seeing one. Pt then performing self feeding with apple sauce. Continues to present with poor depth perception and propioception.  Pt performing reading task with large letters, significant amount of time, and  compensaotry head turn to left. Providing handout on visional occlusion.     Vision   Vision Assessment?: Yes Eye Alignment: Impaired (comment) Ocular Range of Motion: Restricted on the left Alignment/Gaze Preference: Gaze left;Head turned(Able to turn head to L with Max visual cues) Diplopia Assessment: Disappears with one eye closed Additional Comments: Continues to present with significant right gaze and right head turn preference. Pt able to turn head towards left with Min VCs with continued right gaze. Also continuing to present with lateral nystagmus and right pulsing. Providing occulsion glasses with tap placed at nasal postion of non-domiannt eyes. Pt requiring significant occusion to transition from seeing 4 images to 1. Improved fucntional performance with occlusion glasses but continue to present with poor depth perception, coordination, and proprioception.    Perception     Praxis      Cognition Arousal/Alertness: Awake/alert Behavior During Therapy: WFL for tasks assessed/performed Overall Cognitive Status: Within Functional Limits for tasks assessed                                 General Comments: Continue to follow cues and present with [facility] for tasks completed. Will continue to assess        Exercises     Shoulder Instructions       General Comments HR 134 during acitivity. Family present during session    Pertinent Vitals/ Pain       Pain Assessment: Faces Faces Pain Scale: No hurt Pain Intervention(s): Monitored during session  Home Living                                          Prior Functioning/Environment              Frequency  Min 3X/week        Progress Toward Goals  OT Goals(current goals can now be found in the care plan section)  Progress towards OT goals: Progressing toward goals  Acute Rehab OT Goals Patient Stated Goal: Go home OT Goal Formulation: With patient Time For Goal Achievement:  02/21/18 Potential to Achieve Goals: Good ADL Goals Pt Will Perform Upper Body Dressing: with modified independence;sitting Pt Will Perform Lower Body Dressing: with min guard assist;sit to/from stand Additional ADL Goal #1: Pt will attend to 75% of objects in left visual field with Min cues during ADLs Additional ADL Goal #2: Pt will use compensatory techniques for left inattention with Min verbal cues during ADLs Additional ADL Goal #3: Pt will manage occlusion glasses with Min verbal cues  Plan Discharge plan remains appropriate    Co-evaluation    PT/OT/SLP Co-Evaluation/Treatment: Yes Reason for Co-Treatment: Complexity of the patient's impairments (multi-system involvement);To address functional/ADL transfers PT goals addressed during session: Mobility/safety with mobility OT goals addressed during session: ADL's and self-care      AM-PAC PT "6 Clicks" Daily Activity     Outcome Measure   Help from another person eating meals?: A Little Help from another person taking care of personal grooming?: A Little Help from another person toileting, which includes using toliet, bedpan, or urinal?: A Lot Help from another person bathing (including washing, rinsing, drying)?: A Lot Help from another person to put on and taking off regular upper body clothing?: A Lot  Help from another person to put on and taking off regular lower body clothing?: A Lot 6 Click Score: 14    End of Session Equipment Utilized During Treatment: Gait belt;Rolling walker;Right knee immobilizer  OT Visit Diagnosis: Unsteadiness on feet (R26.81);Other abnormalities of gait and mobility (R26.89);Muscle weakness (generalized) (M62.81);Low vision, both eyes (H54.2)   Activity Tolerance Patient tolerated treatment well   Patient Left in bed;with call bell/phone within reach;with bed alarm set;with family/visitor present   Nurse Communication Mobility status        Time: 1610-9604 OT Time Calculation (min):  38 min  Charges: OT General Charges $OT Visit: 1 Visit OT Treatments $Self Care/Home Management : 23-37 mins  Dainel Arcidiacono MSOT, OTR/L Acute Rehab Pager: [phone] Office: [phone]   Theodoro Grist Irisha Grandmaison 02/08/2018, 6:19 PM

## 2018-02-08 NOTE — Evaluation (Signed)
Clinical/Bedside Swallow Evaluation Patient Details  Name: [name] MRN: [id number] Date of Birth: [birth year]  Today's Date: 02/08/2018 Time: SLP Start Time (ACUTE ONLY): 1420 SLP Stop Time (ACUTE ONLY): 1435 SLP Time Calculation (min) (ACUTE ONLY): 15 min  Past Medical History:  Past Medical History:  Diagnosis Date  . CAD (coronary artery disease) 02/18/2009   Small OM occlusion with collaterals, 60% RCA, 40% LAD. April 2015    . Cardiomyopathy, nonischemic (HCC) 05/03/2016  . CHF (congestive heart failure) (HCC)   . CKD (chronic kidney disease)   . History of syncope 08/2008   Secondary to hypertension  . Hypertension, essential, benign    a. renal art [place] (6/14): no RAS  . Hypokalemia 05/03/2016  . Noncompliance   . Nonischemic cardiomyopathy (HCC)    EF 45%  . Obesity   . Sleep apnea   . Tobacco abuse    Past Surgical History:  Past Surgical History:  Procedure Laterality Date  . CARDIAC CATHETERIZATION N/A 12/22/2015   Procedure: Left Heart Cath and Coronary Angiography;  Surgeon: Lyn Records, MD;  Location: [facility] INVASIVE CV LAB;  Service: Cardiovascular;  Laterality: N/A;  . IR US GUIDE VASC ACCESS RIGHT  04/14/2017  . IR VENOGRAM ADRENAL BI  04/14/2017  . IR VENOGRAM RENAL BI  04/14/2017  . IR VENOUS SAMPLING  04/14/2017  . IR VENOUS SAMPLING  04/14/2017  . LEFT HEART CATHETERIZATION WITH CORONARY ANGIOGRAM N/A 02/21/2014   Procedure: LEFT HEART CATHETERIZATION WITH CORONARY ANGIOGRAM;  Surgeon: Kathleene Hazel, MD;  Location: [facility] CATH LAB;  Service: Cardiovascular;  Laterality: N/A;  . None     HPI:  This is a 52 year old with a past medical history of nonischemic cardiomyopathy, congestive heart failure, and hypertension who on previous visits to the emergency room his screen positive for cocaine. He presented with extreme hypertension headache, diplopia, and severe dysarthria, so severe that he required intubation for maintenance of his airway. 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 A repeat CT scan has not shown progression of hydrocephalus or of the bleed. Intubated from 3/17 to 3/20.    Assessment / Plan / Recommendation Clinical Impression  Pt demonstrates relatively good perfomance with PO trials in the setting of multiple risk factors for silent aspiration. Oral phase mildly impaired due to left CN VII weakness though minimal buccal residual can be prevented with pressure to cheek or cleared with verbal cues for a lingual sweep. Timing and strength of swallow subjectively appear adequate. Given 3 day intubation with extubation less than 4 hours ago with ongoing dysphonia and potential for unindentified pharyngeal deficits given site of lesion, will proceed with limited PO this pm. Pt may have bites of puree with meds and ice chips for comfort through the evening. Will f/u for potential diet advancement tomorrow.  SLP Visit Diagnosis: Dysphagia, oropharyngeal phase (R13.12)    Aspiration Risk  Moderate aspiration risk    Diet Recommendation NPO except meds;Ice chips PRN after oral care   Medication Administration: Whole meds with puree    Other  Recommendations Other Recommendations: Have oral suction available   Follow up Recommendations Inpatient Rehab      Frequency and Duration min 2x/week  2 weeks       Prognosis Prognosis for Safe Diet Advancement: Good      Swallow Study   General HPI: This is a 52 year old with a past medical history of nonischemic cardiomyopathy,  congestive heart failure, and hypertension who on previous visits to the emergency room his screen positive for cocaine. He presented with extreme hypertension headache, diplopia, and severe dysarthria, so severe that he required intubation for maintenance of his airway. 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 A repeat CT scan has  not shown progression of hydrocephalus or of the bleed. Intubated from 3/17 to 3/20.  Type of Study: Bedside Swallow Evaluation Previous Swallow Assessment: none Diet Prior to this Study: NPO Temperature Spikes Noted: No Respiratory Status: Nasal cannula History of Recent Intubation: Yes Length of Intubations (days): 4 days Date extubated: 02/08/18 Behavior/Cognition: Alert;Cooperative Oral Cavity Assessment: Within Functional Limits Oral Care Completed by SLP: No Oral Cavity - Dentition: Adequate natural dentition Vision: Impaired for self-feeding Self-Feeding Abilities: Able to feed self;Needs assist Patient Positioning: Upright in bed Baseline Vocal Quality: Hoarse Volitional Cough: Strong Volitional Swallow: Able to elicit    Oral/Motor/Sensory Function Overall Oral Motor/Sensory Function: Mild impairment Facial ROM: Reduced left;Suspected CN VII (facial) dysfunction Facial Symmetry: Abnormal symmetry left;Suspected CN VII (facial) dysfunction Facial Strength: Reduced left;Suspected CN VII (facial) dysfunction Lingual ROM: Within Functional Limits Lingual Symmetry: Within Functional Limits Lingual Strength: Within Functional Limits Lingual Sensation: Within Functional Limits Velum: Within Functional Limits Mandible: Within Functional Limits   Ice Chips Ice chips: Within functional limits Presentation: Spoon   Thin Liquid Thin Liquid: Impaired Presentation: Cup;Straw Oral Phase Impairments: Reduced labial seal Oral Phase Functional Implications: Left anterior spillage;Left lateral sulci pocketing Pharyngeal  Phase Impairments: Cough - Immediate    Nectar Thick Nectar Thick Liquid: Within functional limits Presentation: Straw   Honey Thick Honey Thick Liquid: Not tested   Puree Puree: Within functional limits Presentation: Spoon   Solid   GO   Solid: Not tested       Harlon Ditty, MA CCC-SLP [phone]  [name], Riley Nearing 02/08/2018,2:58 PM

## 2018-02-08 NOTE — Progress Notes (Signed)
STROKE TEAM PROGRESS NOTE   HISTORY OF PRESENT ILLNESS (per record) [name] is an 52 y.o. male past medical history of coronary  Artery disease, chronic heart failure, CKD, hypertension presents to [facility] emergency room as  Stroke alert.  Was last known normal last night 11 PM when he went to bed. He woke up this morning around 6:30 AM, complained of severe headache and double vision. When EMS arrived here forced gaze devaiton to right side and severe dysarthria. BP was >200 systolic. On arrival, stat CT head showed a pontine/midbrain hemorrhage with IVH in the 4th ventricle. Patient was severely dysarthric and intubated for airway protection. He received 40mg  of labetalol and started on Cardene drip. Neurosurgery was consulted for potential EVD placement in case he develops hydrocephalus.  Date last known well: 3.16.19 Time last known well: 11pm tPA Given: no, ICH  NIHSS: 8 Baseline MRS 0    SUBJECTIVE (INTERVAL HISTORY) The patient's mother is   at the bedside.  He remains intubated but is able to follow some commands.  Hope to extubate soon.Blood pressure adequately controlled.  no significant changes.  He seems to be doing well at the moment on pressure support OBJECTIVE Temp:  [98.1 F (36.7 C)-99.6 F (37.6 C)] 98.1 F (36.7 C) (03/20 1200) Pulse Rate:  [73-101] 86 (03/20 1000) Cardiac Rhythm: Normal sinus rhythm (03/20 0800) Resp:  [18-29] 29 (03/20 1000) BP: (133-182)/(49-98) 160/65 (03/20 1000) SpO2:  [95 %-100 %] 99 % (03/20 1305) FiO2 (%):  [40 %] 40 % (03/20 0754) Weight:  [233 lb 4 oz (105.8 kg)] 233 lb 4 oz (105.8 kg) (03/20 0500)  CBC:  Recent Labs  Lab 02/05/18 0649  02/07/18 0826 02/08/18 0423  WBC 8.8  --  9.1 8.2  NEUTROABS 3.6  --  6.2  --   HGB 14.0   < > 12.0* 11.9*  HCT 40.6   < > 37.3* 37.3*  MCV 91.4  --  93.7 94.9  PLT 300  --  290 257   < > = values in this interval not displayed.    Basic Metabolic Panel:  Recent Labs  Lab  02/07/18 0826 02/07/18 1701 02/08/18 0423  NA 140  --  143  K 3.9  --  4.1  CL 104  --  108  CO2 24  --  25  GLUCOSE 128*  --  115*  BUN 24*  --  38*  CREATININE 1.82*  --  1.98*  CALCIUM 9.0  --  9.2  MG 2.0 2.0 2.2  PHOS 3.8 3.9 4.3    Lipid Panel:     Component Value Date/Time   CHOL 123 02/06/2018 1037   TRIG 161 (H) 02/08/2018 0423   HDL 29 (L) 02/06/2018 1037   CHOLHDL 4.2 02/06/2018 1037   VLDL 48 (H) 02/06/2018 1037   LDLCALC 46 02/06/2018 1037   HgbA1c:  Lab Results  Component Value Date   HGBA1C 6.1 (H) 02/06/2018   Urine Drug Screen:     Component Value Date/Time   LABOPIA NONE DETECTED 02/05/2018 1322   COCAINSCRNUR POSITIVE (A) 02/05/2018 1322   COCAINSCRNUR See Final Results 12/15/2015 1012   LABBENZ NONE DETECTED 02/05/2018 1322   AMPHETMU NONE DETECTED 02/05/2018 1322   THCU NONE DETECTED 02/05/2018 1322   LABBARB NONE DETECTED 02/05/2018 1322    Alcohol Level     Component Value Date/Time   ETH <10 02/05/2018 1006    IMAGING   Dg Chest Portable 1  View 02/05/2018 IMPRESSION:  1. Endotracheal tube is low at 1 cm from carina. Consider retraction by 2-3 cm.  2. NG tube in stomach  3. Low lung volumes    CT Head Wo Contrast 02/06/2018 IMPRESSION: 1. No significant interval change in size of acute left paramedian pontomedullary hemorrhage, with similar extension into the adjacent fourth ventricle. No significant mass effect. Minimally increased ventriculomegaly as compared to previous without hydrocephalus. 2. No other new acute intracranial abnormality. 3. Interval development of acute maxillary sinusitis.    Ct Head Code Stroke Wo Contrast  02/05/2018 IMPRESSION:  1. Acute dorsal LEFT paramedian brainstem hemorrhage at the pontomedullary junction, extending into the fourth ventricle. Etiology is undetermined. Hypertension, vascular malformation, anticoagulation, unrecognized trauma, or drug use are all considerations.  2. ASPECTS is  not applicable.  3. Attempts are being made to contact the on-call neurologist.    MRI / MRA Head  02/05/2018 IMPRESSION: Acute LEFT paramedian pontomedullary hemorrhage, roughly similar in size to earlier  CT, 14 x 15 x 21 mm corresponding to an approximate volume of 2-3 mL.  Given the numerous microbleeds elsewhere in the brain, as well as markedly elevated blood pressure on presentation, this abnormality is felt to most likely represent a hypertensive related hemorrhage.  Premature atrophy with chronic microvascular ischemic change.  There is mass effect on the fourth ventricle and aqueduct, and hemorrhage has extended into the adjacent ventricular system. The patient is at risk for development of hydrocephalus.  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 Internal carotid arteries are widely patent. No visible saccular aneurysm or vascular malformation.   Transthoracic Echocardiogram - Left ventricle: The cavity size was normal. There was moderate   concentric hypertrophy. Systolic function was mildly reduced. The   estimated ejection fraction was in the range of 45% to 50%. Mild   diffuse hypokinesis with no identifiable regional variations   Bilateral Carotid Dopplers - Right Carotid: Velocities in the right ICA are consistent with a 1-39% stenosis. Left Carotid: Velocities in the left ICA are consistent with a 1-39% stenosis. Vertebrals: Bilateral vertebral arteries demonstrate antegrade flow.        PHYSICAL EXAM Vitals:   02/08/18 0900 02/08/18 1000 02/08/18 1200 02/08/18 1305  BP: (!) 137/51 (!) 160/65    Pulse: 85 86    Resp: (!) 24 (!) 29    Temp:   98.1 F (36.7 C)   TempSrc:   Axillary   SpO2: 96% 98%  99%  Weight:      Height:       Pleasant middle-aged African-American gentleman who is sedated and intubated. . Afebrile. Head is nontraumatic. Neck is supple without bruit.    Cardiac exam no  murmur or gallop. Lungs are clear to auscultation. Distal pulses are well felt. Neurological Exam :    intubated.  Drowsy but opens eyes and follows commands.  Right gaze preference.  Left gaze palsy.  Nystagmus when he looks to the right with a. downbeat component.   .  Skew eye deviation with left eye deviated downwards.  Pupils 3 mm equal reactive.  Fundi not visualized.  Blinks to threat more on the right than the left.  Face is symmetric.  Tongue midline.  Has a good cough and gag.  Motor system exam patient is able to move all 4 extremities against gravity but moves left side more than right.  Deep tendon reflexes are 2+ symmetric.  Plantars are downgoing.  Sensation appears preserved bilaterally.  Gait not tested.  ASSESSMENT/PLAN Mr. [name] is a 52 y.o. male with history of coronary artery disease, cardiomyopathy, congestive heart failure, chronic kidney disease, hypertension, medical noncompliance, obesity, obstructive sleep apnea, previous tobacco use, and previous cocaine use presenting with a severe headache, diplopia, gaze deviation, elevated blood pressure, and dysarthria. He did not receive IV t-PA due to hemorrhage.  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  Carotid Doppler - 1-39 stenosis blaterally  2DECHO : EF 45 -50% diffuse hypokinesis  pending  UDS - positive for cocaine  LDL - 46  HgbA1c - 6.1  VTE prophylaxis - SCDs Fall precautions Diet NPO time specified  aspirin 81 mg daily prior to admission, now on No antithrombotic  Ongoing aggressive stroke  risk factor management  Therapy recommendations:  CLR  Disposition:  Pending  Hypertension  BP somewhat high at times.  Systolic blood pressure goal of less than 140 for the first 24 hours.  Long-term BP goal normotensive  Hyperlipidemia  Home meds: Lipitor 80 mg daily prior to admission now on hold secondary to hemorrhage.  LDL 46, goal < 70  Resume statin at discharge   Other Stroke Risk Factors  Former cigarette smoker - quit  Obesity, Body mass index is 34.44 kg/m., recommend weight loss, diet and exercise as appropriate   Coronary artery disease  Obstructive sleep apnea   Other Active Problems  75% stenosis of the distal Lt VA, which is the sole contributor to the basilar artery.  Coronary artery disease /cardiomyopathy  History of drug use - UDS positive for cocaine  CKD - BUN 23 ; creatinine 1.97  Hyperglycemia -hemoglobin A1c 6.1  Head CT 02/06/2018 - stable  Intubated  NPO -consider tube feedings if not extubated soon and able to eat.  Systolic blood pressure goal liberalized to < 180 mmHg. Home BP meds restarted.      Plan / Recommendations   Continue strict blood pressure control with systolic goal below  180    Patient could be extubated   if he is able to protect his airwaya CCM feels he is ready.  Discussed with patient`s Mother at the bedside and answered questions.  Discussed with Dr. Warren Lacy pulmonary critical care medicine. This patient is critically ill and at significant risk of neurological worsening, death and care requires constant monitoring of vital signs, hemodynamics,respiratory and cardiac monitoring, extensive review of multiple databases, frequent neurological assessment, discussion with family, other specialists and medical decision making of high complexity.I have made any additions or clarifications directly to the above note.This critical care time does not reflect procedure time, or teaching time or supervisory time of  PA/NP/Med Resident etc but could involve care discussion time.  I spent 32  minutes of neurocritical care time  in the care of  this patient. Delia Heady, MD Medical Director [facility] Stroke [facility] Pager: [phone] 02/08/2018 2:58 PM   02/08/2018 2:58 PM      Hospital day # 3     To contact Stroke Continuity provider, please refer to [email or web address]. After hours, contact General Neurology

## 2018-02-09 ENCOUNTER — Other Ambulatory Visit: Payer: Self-pay

## 2018-02-09 ENCOUNTER — Encounter (HOSPITAL_COMMUNITY): Payer: Self-pay | Admitting: *Deleted

## 2018-02-09 LAB — GLUCOSE, CAPILLARY
GLUCOSE-CAPILLARY: 133 mg/dL — AB (ref 65–99)
GLUCOSE-CAPILLARY: 91 mg/dL (ref 65–99)
GLUCOSE-CAPILLARY: 198 mg/dL — AB (ref 65–99)
GLUCOSE-CAPILLARY: 150 mg/dL — AB (ref 65–99)
Glucose-Capillary: 94 mg/dL (ref 65–99)

## 2018-02-09 LAB — CBC WITH DIFFERENTIAL/PLATELET
Basophils Absolute: 0 10*3/uL (ref 0.0–0.1)
Basophils Relative: 0 %
EOS ABS: 0.4 10*3/uL (ref 0.0–0.7)
EOS PCT: 4 %
HCT: 35.2 % — ABNORMAL LOW (ref 39.0–52.0)
HEMOGLOBIN: 11.1 g/dL — AB (ref 13.0–17.0)
LYMPHS ABS: 1.3 10*3/uL (ref 0.7–4.0)
LYMPHS PCT: 15 %
MCH: 30.2 pg (ref 26.0–34.0)
MCHC: 31.5 g/dL (ref 30.0–36.0)
MCV: 95.9 fL (ref 78.0–100.0)
MONOS PCT: 10 %
Monocytes Absolute: 0.9 10*3/uL (ref 0.1–1.0)
Neutro Abs: 6.3 10*3/uL (ref 1.7–7.7)
Neutrophils Relative %: 71 %
PLATELETS: 285 10*3/uL (ref 150–400)
RBC: 3.67 MIL/uL — ABNORMAL LOW (ref 4.22–5.81)
RDW: 14.5 % (ref 11.5–15.5)
WBC: 8.8 10*3/uL (ref 4.0–10.5)

## 2018-02-09 LAB — BASIC METABOLIC PANEL
Anion gap: 11 (ref 5–15)
BUN: 37 mg/dL — AB (ref 6–20)
CHLORIDE: 106 mmol/L (ref 101–111)
CO2: 25 mmol/L (ref 22–32)
CREATININE: 1.85 mg/dL — AB (ref 0.61–1.24)
Calcium: 8.9 mg/dL (ref 8.9–10.3)
GFR calc Af Amer: 47 mL/min — ABNORMAL LOW (ref >60)
GFR calc non Af Amer: 40 mL/min — ABNORMAL LOW (ref >60)
Glucose, Bld: 108 mg/dL — ABNORMAL HIGH (ref 65–99)
POTASSIUM: 4.1 mmol/L (ref 3.5–5.1)
Sodium: 142 mmol/L (ref 135–145)

## 2018-02-09 MED ORDER — LISINOPRIL 10 MG PO TABS
10.0000 mg | ORAL_TABLET | Freq: Every day | ORAL | Status: DC
  Administered 2018-02-09 – 2018-02-10 (×2): 10 mg via ORAL
  Filled 2018-02-09 (×2): qty 1

## 2018-02-09 MED ORDER — ADULT MULTIVITAMIN W/MINERALS CH
1.0000 | ORAL_TABLET | Freq: Every day | ORAL | Status: DC
  Administered 2018-02-09 – 2018-02-10 (×2): 1 via ORAL
  Filled 2018-02-09 (×2): qty 1

## 2018-02-09 MED ORDER — IPRATROPIUM-ALBUTEROL 0.5-2.5 (3) MG/3ML IN SOLN
3.0000 mL | Freq: Three times a day (TID) | RESPIRATORY_TRACT | Status: DC
  Administered 2018-02-10 (×2): 3 mL via RESPIRATORY_TRACT
  Filled 2018-02-09 (×2): qty 3

## 2018-02-09 MED ORDER — RESOURCE THICKENUP CLEAR PO POWD
ORAL | Status: DC | PRN
Start: 2018-02-09 — End: 2018-02-10
  Filled 2018-02-09: qty 125

## 2018-02-09 MED ORDER — ENOXAPARIN SODIUM 40 MG/0.4ML ~~LOC~~ SOLN
40.0000 mg | SUBCUTANEOUS | Status: DC
  Administered 2018-02-09 – 2018-02-10 (×2): 40 mg via SUBCUTANEOUS
  Filled 2018-02-09 (×2): qty 0.4

## 2018-02-09 NOTE — Progress Notes (Signed)
Rehab admissions - I met briefly with wife and patient.  I gave wife rehab booklets.  Patient was receiving nursing care.  I will follow up again tomorrow.  Call me for questions.  #[phone]

## 2018-02-09 NOTE — Progress Notes (Signed)
Nutrition Follow-up  DOCUMENTATION CODES:   Obesity unspecified  INTERVENTION:   Encourage PO intake and provide additional supplementation as appropriate.    NUTRITION DIAGNOSIS:   Inadequate oral intake related to dysphagia as evidenced by (theraputic diet just advanced). Ongoing.   GOAL:   Provide needs based on ASPEN/SCCM guidelines Progressing.   MONITOR:   TF tolerance, Labs  ASSESSMENT:   Pt with PMH of nonischemic cardiomyopathy, CKD, HLD, noncompliance, CHF, HTN, cocaine abuse admitted with extreme HTN. CT shows L pontomedullary hemorrhage likely from HTN.   Pt discussed during ICU rounds and with RN.   3/20 extubated Pt had FEES today and diet advanced. Pt is hungry and ready to eat.   Medications reviewed and include: lasix, MVI, senokot-s, spironolactone NS with 20 mEq KCl @ 50 ml/hr Labs reviewed     Diet Order:  Fall precautions DIET DYS 3 Room service appropriate? Yes; Fluid consistency: Nectar Thick  EDUCATION NEEDS:   No education needs have been identified at this time  Skin:  Skin Assessment: Reviewed RN Assessment  Last BM:  unknown  Height:   Ht Readings from Last 1 Encounters:  02/05/18 5\' 9"  (1.753 m)    Weight:   Wt Readings from Last 1 Encounters:  02/09/18 231 lb 14.8 oz (105.2 kg)    Ideal Body Weight:  72.7 kg  BMI:  Body mass index is 34.25 kg/m.  Estimated Nutritional Needs:   Kcal:  2000-2200  Protein:  110-125 grams  Fluid:  2 L/day  Kendell Bane RD, LDN, CNSC [phone] Pager [phone] After Hours Pager

## 2018-02-09 NOTE — Procedures (Signed)
Objective Swallowing Evaluation: Type of Study: FEES-Fiberoptic Endoscopic Evaluation of Swallow   Patient Details  Name: [name] MRN: [id number] Date of Birth: September 16, 1966  Today's Date: 02/09/2018 Time: SLP Start Time (ACUTE ONLY): 1320 -SLP Stop Time (ACUTE ONLY): 1352  SLP Time Calculation (min) (ACUTE ONLY): 32 min   Past Medical History:  Past Medical History:  Diagnosis Date  . CAD (coronary artery disease) 02/18/2009   Small OM occlusion with collaterals, 60% RCA, 40% LAD. April 2015    . Cardiomyopathy, nonischemic (HCC) 05/03/2016  . CHF (congestive heart failure) (HCC)   . CKD (chronic kidney disease)   . History of syncope 08/2008   Secondary to hypertension  . Hypertension, essential, benign    a. renal art [place] (6/14): no RAS  . Hypokalemia 05/03/2016  . Noncompliance   . Nonischemic cardiomyopathy (HCC)    EF 45%  . Obesity   . Sleep apnea   . Tobacco abuse    Past Surgical History:  Past Surgical History:  Procedure Laterality Date  . CARDIAC CATHETERIZATION N/A 12/22/2015   Procedure: Left Heart Cath and Coronary Angiography;  Surgeon: Lyn Records, MD;  Location: [facility] INVASIVE CV LAB;  Service: Cardiovascular;  Laterality: N/A;  . IR US GUIDE VASC ACCESS RIGHT  04/14/2017  . IR VENOGRAM ADRENAL BI  04/14/2017  . IR VENOGRAM RENAL BI  04/14/2017  . IR VENOUS SAMPLING  04/14/2017  . IR VENOUS SAMPLING  04/14/2017  . LEFT HEART CATHETERIZATION WITH CORONARY ANGIOGRAM N/A 02/21/2014   Procedure: LEFT HEART CATHETERIZATION WITH CORONARY ANGIOGRAM;  Surgeon: Kathleene Hazel, MD;  Location: [facility] CATH LAB;  Service: Cardiovascular;  Laterality: N/A;  . None     HPI: This is a 52 year old with a past medical history of nonischemic cardiomyopathy, congestive heart failure, and hypertension who on previous visits to the emergency room his screen positive for cocaine. He presented with extreme hypertension headache, diplopia, and severe dysarthria, so severe that he  required intubation for maintenance of his airway. 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 A repeat CT scan has not shown progression of hydrocephalus or of the bleed. Intubated from 3/17 to 3/20.    No data recorded   Assessment / Plan / Recommendation  CHL IP CLINICAL IMPRESSIONS 02/09/2018  Clinical Impression Pt demonstrates  a mild oropharygneal dysphagia with delayed epiglottic defection/laryngeal closure likely due to sensory changes following intubation. There are also several sites of white exceresence in vestibule and on bialteral posterior 1/3 of VF suggesting contact ulcerations from ETT. Tissue on ventricular folds and posterior commisure also noted to be errythematous and edematous. Thin liquids penetrate to the cords prior to epiglottic deflection, with risk of silent aspiration given condition of vocal folds. Anatomy of left lateral pharyngeal wall also abnormal, tissue pulsing and bulging into pharynx creating asymmetry of the pharyngeal cavity (perhaps a vein or artery). Initially concerned this was indicative of neuromuscular weakness of the left pharyngeal wall, but no residual observed and normal peristalsis occurred throughout suggesting this is just a baseline anatomical anomaly. Recommend initiating a dys 3 (mechanical soft) diet with nectar thick liquids as vocal folds and heal and glottic compentence subjectively improves. Likely pt can advance diet with no repeat testing if phonation is less dysphonic.   SLP Visit Diagnosis Dysphagia, oropharyngeal phase (R13.12)  Attention and concentration deficit following --  Frontal lobe and executive function deficit following --  Impact on  safety and function Mild aspiration risk      CHL IP TREATMENT RECOMMENDATION 02/09/2018  Treatment Recommendations Therapy as outlined in treatment plan below     Prognosis 02/08/2018  Prognosis for Safe Diet Advancement Good   Barriers to Reach Goals --  Barriers/Prognosis Comment --    CHL IP DIET RECOMMENDATION 02/09/2018  SLP Diet Recommendations Regular solids;Nectar thick liquid  Liquid Administration via Cup;Straw  Medication Administration Whole meds with liquid  Compensations Slow rate;Small sips/bites  Postural Changes Seated upright at 90 degrees      CHL IP OTHER RECOMMENDATIONS 02/09/2018  Recommended Consults --  Oral Care Recommendations Oral care before and after PO  Other Recommendations Have oral suction available      CHL IP FOLLOW UP RECOMMENDATIONS 02/09/2018  Follow up Recommendations Inpatient Rehab      CHL IP FREQUENCY AND DURATION 02/09/2018  Speech Therapy Frequency (ACUTE ONLY) min 2x/week  Treatment Duration 2 weeks           CHL IP ORAL PHASE 02/09/2018  Oral Phase Impaired  Oral - Pudding Teaspoon --  Oral - Pudding Cup --  Oral - Honey Teaspoon --  Oral - Honey Cup --  Oral - Nectar Teaspoon --  Oral - Nectar Cup WFL  Oral - Nectar Straw Other (Comment)  Oral - Thin Teaspoon --  Oral - Thin Cup WFL  Oral - Thin Straw Other (Comment)  Oral - Puree WFL  Oral - Mech Soft WFL  Oral - Regular --  Oral - Multi-Consistency --  Oral - Pill --  Oral Phase - Comment --    CHL IP PHARYNGEAL PHASE 02/09/2018  Pharyngeal Phase Impaired  Pharyngeal- Pudding Teaspoon --  Pharyngeal --  Pharyngeal- Pudding Cup --  Pharyngeal --  Pharyngeal- Honey Teaspoon --  Pharyngeal --  Pharyngeal- Honey Cup --  Pharyngeal --  Pharyngeal- Nectar Teaspoon --  Pharyngeal --  Pharyngeal- Nectar Cup WFL  Pharyngeal --  Pharyngeal- Nectar Straw WFL  Pharyngeal --  Pharyngeal- Thin Teaspoon --  Pharyngeal --  Pharyngeal- Thin Cup Penetration/Aspiration before swallow  Pharyngeal Material enters airway, remains ABOVE vocal cords and not ejected out;Material enters airway, remains ABOVE vocal cords then ejected out  Pharyngeal- Thin Straw Penetration/Aspiration before swallow   Pharyngeal Material enters airway, remains ABOVE vocal cords and not ejected out  Pharyngeal- Puree WFL  Pharyngeal --  Pharyngeal- Mechanical Soft --  Pharyngeal --  Pharyngeal- Regular WFL  Pharyngeal --  Pharyngeal- Multi-consistency --  Pharyngeal --  Pharyngeal- Pill --  Pharyngeal --  Pharyngeal Comment --     No flowsheet data found.  No flowsheet data found. Harlon Ditty, [place] CCC-SLP [phone]  Claudine Mouton 02/09/2018, 2:47 PM

## 2018-02-09 NOTE — Progress Notes (Signed)
PULMONARY / CRITICAL CARE MEDICINE   Name: [name] MRN: [id number] DOB: 01-04-1966    ADMISSION DATE:  02/05/2018  CHIEF COMPLAINT:  Diplopia and dysarthria  HISTORY OF PRESENT ILLNESS:   This is a 52 year old with a past medical history of nonischemic cardiomyopathy, congestive heart failure, and hypertension who on previous visits to the emergency room has screened positive for cocaine.  He presented with extreme hypertension headache, diplopia, and severe dysarthria, so severe that he required intubation for maintenance of his airway.  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  A repeat CT scan has not shown progression of hydrocephalus or of the bleed.    Failed SBT after about 30 minutes. ( Less time than 3/18) Per nursing he required more sedation while weaning than when on full vent support due to tachypnea. Repeat CT scan 3/18  shows no progression of his pontomedullary hemorrhage or hydrocephalus. His urine drug screen was positive for cocaine   SUBJECTIVE:  He was successfully extubated yesterday.  He is not complaining of any difficulty in controlling his secretions.  He has persistent diplopia.  VITAL SIGNS: BP (!) 172/97   Pulse 86   Temp 98.1 F (36.7 C) (Oral)   Resp (!) 27   Ht 5\' 9"  (1.753 m)   Wt 231 lb 14.8 oz (105.2 kg)   SpO2 100%   BMI 34.25 kg/m   HEMODYNAMICS:    VENTILATOR SETTINGS:    INTAKE / OUTPUT: I/O last 3 completed shifts: In: 2331.3 [I.V.:2086.3; NG/GT:245] Out: 2920 [Urine:2920]  PHYSICAL EXAMINATION: General: Extubated and resting comfortably in bed, in no distress.   Neuro: His voice is a little hoarse but his speech content is appropriate and he is appropriately interactive.  He cannot move his eyes across the midline to the left.  There is a left facial droop. Cardiovascular: S1 and S2 are regular without murmur rub or gallop.  There is no dependent edema.      Lungs: Respirations are unlabored, there is symmetric air movement, no wheezes.  The lungs are clear anteriorly.   Abdomen: The abdomen obese,  soft , NT without any organomegaly masses or tenderness.  LABS:  BMET Recent Labs  Lab 02/07/18 0826 02/08/18 0423 02/09/18 0540  NA 140 143 142  K 3.9 4.1 4.1  CL 104 108 106  CO2 24 25 25   BUN 24* 38* 37*  CREATININE 1.82* 1.98* 1.85*  GLUCOSE 128* 115* 108*    Electrolytes Recent Labs  Lab 02/07/18 0826 02/07/18 1701 02/08/18 0423 02/08/18 1629 02/09/18 0540  CALCIUM 9.0  --  9.2  --  8.9  MG 2.0 2.0 2.2 2.3  --   PHOS 3.8 3.9 4.3 5.5*  --     CBC Recent Labs  Lab 02/07/18 0826 02/08/18 0423 02/09/18 0540  WBC 9.1 8.2 8.8  HGB 12.0* 11.9* 11.1*  HCT 37.3* 37.3* 35.2*  PLT 290 257 285    Coag's Recent Labs  Lab 02/05/18 0649  APTT 28  INR 0.93    Sepsis Markers No results for input(s): LATICACIDVEN, PROCALCITON, O2SATVEN in the last 168 hours.  ABG Recent Labs  Lab 02/05/18 0836  PHART 7.290*  PCO2ART 68.1*  PO2ART 120.0*    Liver Enzymes Recent Labs  Lab 02/05/18 0649  AST 21  ALT 34  ALKPHOS 67  BILITOT 0.7  ALBUMIN 3.9    Cardiac Enzymes No results for input(s): TROPONINI, PROBNP in  the last 168 hours.  Glucose Recent Labs  Lab 02/08/18 1143 02/08/18 1539 02/08/18 1927 02/08/18 2321 02/09/18 0324 02/09/18 0746  GLUCAP 99 117* 92 94 94 133*    Imaging No results found.   ANTIBIOTICS: None  DISCUSSION:      This is a 52 year old with a nonischemic cardiomyopathy who has suffered from a pontomedullary hemorrhage.  His tox screen on admission was positive for cocaine   ASSESSMENT / PLAN:  PULMONARY A: He was extubated yesterday and this has been well-tolerated.   CARDIOVASCULAR A: He has a history of nonischemic cardiomyopathy.  Blood pressure is 148/82. He is already afterload reduced with hydralazine, his creatinine is stable today and I have increased his dose of  lisinopril.   I have spoken with him today about the use of cocaine and advised him that the insults to his heart and his brain are secondary to the use of that agent.    Renal Elevated Creatinine Plan: His creatinine has remained stable with the introduction of a small dose of ACE inhibitor, I have increased his dose of lisinopril today.   NEUROLOGIC A: She is day 4 status post presentation with a pontine midbrain hemorrhage with extension into the ventricular system.  Has not evolved further neurological deficits.  Penny Pia, MD Pulmonary and Critical Care Medicine [facility] Pager: [phone]  As he is no longer requiring acute support, the critical care service will sign off, please reconsult [place] as needed.  02/09/2018, 8:27 AM

## 2018-02-09 NOTE — H&P (Signed)
Physical Medicine and Rehabilitation Admission H&P    Chief Complaint  Patient presents with  . Functional deficits due to stroke    HPI: [name] is a 52 y.o. male with history of CAD, hyperaldosteronism, CKD, HTN, NICM, recent R-Knee Fx?,  medication noncompliance;  who was admitted on 02/05/2018 with headache double vision and dysarthria.  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  UDS was positive for cocaine. He was started on Cardene drip due to elevated blood pressure.  MRI MRA of the brain showed acute left paramedian pontomedullary hemorrhage roughly same size, mass-effect on fourth ventricle and aqueduct with concerns for development of hydrocephalus, 75% stenosis of distal left vertebral artery and no saccular aneurysm or vascular malformation.  Repeat CT of head 3/18  showed no significant increase in size of bleed.  Carotid Doppler showed no significant ICA stenosis. 2D echo showed EF of 45-50% with mild diffuse hypokinesis no thrombus moderately dilated left atrium.  Dr. Saintclair Halsted was consulted for input and felt no surgical intervention needed and patient tolerated extubation by 3/19.  Dr. 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  He tolerated extubation on 03/21 and was started on regular textures with nectars.   Therapy ongoing and patient with resultant right gaze preference with left neglect, double vision and poor sitting balance.  CIR recommended  for follow-up therapy due to functional deficits.     Review of Systems  Constitutional: Negative for chills and fever.  HENT: Negative for hearing loss and tinnitus.   Eyes: Positive for double vision. Negative for photophobia.       Visual hallucinations "seeing all types of things"  Respiratory: Negative for cough and shortness of breath.   Gastrointestinal: Positive for  constipation. Negative for heartburn and nausea.  Genitourinary: Negative for dysuria and urgency.  Musculoskeletal: Negative for back pain, joint pain and myalgias.  Skin: Negative for rash.  Neurological: Positive for speech change and focal weakness. Negative for dizziness, sensory change and headaches.  Psychiatric/Behavioral: The patient is nervous/anxious and has insomnia.       Past Medical History:  Diagnosis Date  . CAD (coronary artery disease) 02/18/2009   Small OM occlusion with collaterals, 60% RCA, 40% LAD. April 2015    . Cardiomyopathy, nonischemic ([facility]) 05/03/2016  . CHF (congestive heart failure) ([facility])   . CKD (chronic kidney disease)   . History of syncope 08/2008   Secondary to hypertension  . Hypertension, essential, benign    a. renal art [place] (6/14): no RAS  . Hypokalemia 05/03/2016  . Noncompliance   . Nonischemic cardiomyopathy ([facility])    EF 45%  . Obesity   . Sleep apnea   . Tobacco abuse     Past Surgical History:  Procedure Laterality Date  . CARDIAC CATHETERIZATION N/A 12/22/2015   Procedure: Left Heart Cath and Coronary Angiography;  Surgeon: Belva Crome, MD;  Location: [facility];  Service: Cardiovascular;  Laterality: N/A;  . IR US GUIDE VASC ACCESS RIGHT  04/14/2017  . IR VENOGRAM ADRENAL BI  04/14/2017  . IR VENOGRAM RENAL BI  04/14/2017  . IR VENOUS SAMPLING  04/14/2017  . IR VENOUS SAMPLING  04/14/2017  . LEFT HEART CATHETERIZATION WITH CORONARY ANGIOGRAM N/A 02/21/2014   Procedure: LEFT HEART CATHETERIZATION WITH CORONARY ANGIOGRAM;  Surgeon: Burnell Blanks, MD;  Location: [facility];  Service: Cardiovascular;  Laterality: N/A;  . None      Family History  Problem Relation Age of Onset  . Lupus Mother   . Heart disease Maternal Grandfather   . Coronary artery disease Neg Hx   . Adrenal disorder Neg Hx     Social History:  Lives with girlfriend. Is a disabled cook.  Girlfriend works days as a Quarry manager. Per reports that he has quit  smoking. His smoking use included cigarettes. He has a 3.20 pack-year smoking history. he has never used smokeless tobacco. Per reports he does not drink alcohol or use drugs.    Allergies  Allergen Reactions  . Penicillins Other (See Comments)    Makes his throat itch really bad, and feels like it is closing up.  Has patient had a PCN reaction causing immediate rash, facial/tongue/throat swelling, SOB or lightheadedness with hypotension: Yes Has patient had a PCN reaction causing severe rash involving mucus membranes or skin necrosis: No Has patient had a PCN reaction that required hospitalization: Yes Has patient had a PCN reaction occurring within the last 10 years: No If all of the above answers are "NO", then may proceed with Cephalospo  . Shrimp [Shellfish Allergy]     Throat swells    Medications Prior to Admission  Medication Sig Dispense Refill  . acetaminophen-codeine (TYLENOL #3) 300-30 MG tablet Take 1 tablet by mouth every 6 (six) hours as needed for moderate pain. 15 tablet 0  . amLODipine (NORVASC) 10 MG tablet Take 1 tablet (10 mg total) by mouth daily. 30 tablet 0  . aspirin EC 81 MG tablet Take 1 tablet (81 mg total) by mouth daily. 90 tablet 3  . atorvastatin (LIPITOR) 80 MG tablet Take 1 tablet (80 mg total) by mouth daily at 6 PM. 30 tablet 0  . furosemide (LASIX) 40 MG tablet Take 1 tablet (40 mg total) by mouth daily. 30 tablet 0  . hydrALAZINE (APRESOLINE) 100 MG tablet Take 1 tablet (100 mg total) by mouth 3 (three) times daily. 90 tablet 0  . HYDROcodone-acetaminophen (NORCO/VICODIN) 5-325 MG tablet Take 2 tablets by mouth every 6 (six) hours as needed. (Patient taking differently: Take 2 tablets by mouth every 6 (six) hours as needed for moderate pain. ) 16 tablet 0  . isosorbide mononitrate (IMDUR) 60 MG 24 hr tablet Take 1 tablet (60 mg total) by mouth daily. 30 tablet 0  . labetalol (NORMODYNE) 200 MG tablet Take 2 tablets (400 mg total) by mouth 2 (two) times  daily. 90 tablet 0  . nitroGLYCERIN (NITROSTAT) 0.4 MG SL tablet Place 1 tablet (0.4 mg total) under the tongue every 5 (five) minutes as needed. For chest pain 10 tablet 0  . potassium chloride SA (K-DUR,KLOR-CON) 20 MEQ tablet Take 2 tablets (40 mEq total) by mouth daily. 60 tablet 0  . spironolactone (ALDACTONE) 50 MG tablet Take 1 tablet (50 mg total) by mouth daily. 30 tablet 0    Drug Regimen Review  Drug regimen was reviewed and remains appropriate with no significant issues identified  Home: Home Living Family/patient expects to be discharged to:: Private residence Living Arrangements: Spouse/significant other Available Help at Discharge: Family, Available 24 hours/day Type of Home: House Home Access: Ramped entrance Home Layout: Two level, Able to live on main level with bedroom/bathroom Bathroom Shower/Tub: Multimedia programmer: Handicapped height Home Equipment: None Additional Comments: Planning to dc home with brother and have family switch in and out for 24 hour support  Functional History: Prior Function Level of Independence: Independent Comments: ADLs and IADLs. Not driving and working and on disability  Functional Status:  Mobility: Bed Mobility Overal bed mobility: Needs Assistance Bed Mobility: Supine to Sit, Sit to Supine Supine to sit: +2 for safety/equipment, Min guard Sit to supine: Mod assist, +2 for physical assistance General bed mobility comments: Min Guard for safety Transfers Overall transfer level: Needs assistance Equipment used: Rolling walker (2 wheeled) Transfers: Sit to/from Stand, Risk manager Sit to Stand: Max assist, +2 physical assistance Stand pivot transfers: Max assist, +2 physical assistance  Lateral/Scoot Transfers: Min assist, +2 safety/equipment General transfer comment: Pt requiring max verbal and tactile cues for placing LUE on RW. Requiring Max A +2 for balance during transfers. Maintaining NWB status  until cleared for RLE at baseline.  Ambulation/Gait General Gait Details: NT    ADL: ADL Overall ADL's : Needs assistance/impaired Eating/Feeding: Minimal assistance, Sitting Eating/Feeding Details (indicate cue type and reason): Pt requiring Min A for opening apple sauce and holding cup. Pt requiring cues to problem solve bringing spoon to mouth; overshooting to left side of mouth.  Grooming: Wash/dry face, Sitting, Set up Grooming Details (indicate cue type and reason): Wiping mouth after eating Upper Body Bathing: Moderate assistance, Sitting Lower Body Bathing: Maximal assistance, Bed level Lower Body Bathing Details (indicate cue type and reason): Due to intubation, pt requiring Max A for LB bathing at bed level Upper Body Dressing : Moderate assistance, Sitting Lower Body Dressing: Maximal assistance, Bed level Lower Body Dressing Details (indicate cue type and reason): donning socks at bed level. Pt able elevate BLEs off bed to assist with donning socks Toilet Transfer: Maximal assistance, +2 for physical assistance, Stand-pivot(Simualted to recliner) Toilet Transfer Details (indicate cue type and reason): 6 Functional mobility during ADLs: Maximal assistance, +2 for physical assistance, Rolling walker(Per pt's significant other; maintaining NWB RLE ) General ADL Comments: Providing pt with occulsion glasses for diplopia. After adjusting glasses from seeing quadruple vision to seeing one. Pt then performing self feeding with apple sauce. Continues to present with poor depth perception and propioception. Pt performing reading task with large letters, significant amount of time, and compensaotry head turn to left. Providing handout on visional occlusion.  Cognition: Cognition Overall Cognitive Status: Within Functional Limits for tasks assessed Orientation Level: Oriented X4 Cognition Arousal/Alertness: Awake/alert Behavior During Therapy: WFL for tasks assessed/performed Overall  Cognitive Status: Within Functional Limits for tasks assessed General Comments: Continue to follow cues and present with [facility] for tasks completed. Will continue to assess Difficult to assess due to: Intubated   Blood pressure (!) 153/84, pulse (!) 115, temperature 98.6 F (37 C), temperature source Oral, resp. rate 16, height '5\' 9"'  (1.753 m), weight 105.2 kg (231 lb 14.8 oz), SpO2 100 %. Physical Exam  Nursing note and vitals reviewed. Constitutional: He is oriented to person, place, and time. He appears well-developed and well-nourished. No distress.  HENT:  Head: Normocephalic and atraumatic.  Mouth/Throat: Oropharynx is clear and moist.  Eyes: Pupils are equal, round, and reactive to light. Conjunctivae are normal.  Neck: Normal range of motion. Neck supple.  Cardiovascular: Normal rate and regular rhythm.  No murmur heard. Respiratory: Effort normal and breath sounds normal. No stridor. No respiratory distress.  GI: Soft. Bowel sounds are normal. He exhibits no distension. There is no tenderness.  Musculoskeletal: He exhibits no edema.  Neurological: He is alert and oriented to person, place, and time.  Left facial weakness with moderate dysarthria. Left  central 7/tonge deviation.  Right gaze preference. Eyes fixed to right field with reports of diplopia and visual hallucinations. Able to follow basic motor commands without difficulty.   Skin: Skin is warm and dry. He is not diaphoretic.    Results for orders placed or performed during the hospital encounter of 02/05/18 (from the past 48 hour(s))  Magnesium     Status: None   Collection Time: 02/07/18  5:01 PM  Result Value Ref Range   Magnesium 2.0 1.7 - 2.4 mg/dL    Comment: Performed at [facility], [place] [address] [place], [place] [address]  Phosphorus     Status: None   Collection Time: 02/07/18  5:01 PM  Result Value Ref Range   Phosphorus 3.9 2.5 - 4.6 mg/dL    Comment: Performed at [facility]  [address], [place], [place] [address]  Glucose, capillary     Status: None   Collection Time: 02/07/18  7:52 PM  Result Value Ref Range   Glucose-Capillary 96 65 - 99 mg/dL  Glucose, capillary     Status: Abnormal   Collection Time: 02/07/18 11:29 PM  Result Value Ref Range   Glucose-Capillary 135 (H) 65 - 99 mg/dL  Glucose, capillary     Status: Abnormal   Collection Time: 02/08/18  3:27 AM  Result Value Ref Range   Glucose-Capillary 117 (H) 65 - 99 mg/dL  Magnesium     Status: None   Collection Time: 02/08/18  4:23 AM  Result Value Ref Range   Magnesium 2.2 1.7 - 2.4 mg/dL    Comment: Performed at [facility], [place] [address] [place], [place] [address]  Phosphorus     Status: None   Collection Time: 02/08/18  4:23 AM  Result Value Ref Range   Phosphorus 4.3 2.5 - 4.6 mg/dL    Comment: Performed at [facility] [address], [place], [place] [address]  CBC     Status: Abnormal   Collection Time: 02/08/18  4:23 AM  Result Value Ref Range   WBC 8.2 4.0 - 10.5 K/uL   RBC 3.93 (L) 4.22 - 5.81 MIL/uL   Hemoglobin 11.9 (L) 13.0 - 17.0 g/dL   HCT 37.3 (L) 39.0 - 52.0 %   MCV 94.9 78.0 - 100.0 fL   MCH 30.3 26.0 - 34.0 pg   MCHC 31.9 30.0 - 36.0 g/dL   RDW 14.7 11.5 - 15.5 %   Platelets 257 150 - 400 K/uL    Comment: Performed at [facility], [place] [address], [place], [place] [address]  Basic metabolic panel     Status: Abnormal   Collection Time: 02/08/18  4:23 AM  Result Value Ref Range   Sodium 143 135 - 145 mmol/L   Potassium 4.1 3.5 - 5.1 mmol/L   Chloride 108 101 - 111 mmol/L   CO2 25 22 - 32 mmol/L   Glucose, Bld 115 (H) 65 - 99 mg/dL   BUN 38 (H) 6 - 20 mg/dL   Creatinine, Ser 1.98 (H) 0.61 - 1.24 mg/dL   Calcium 9.2 8.9 - 10.3 mg/dL   GFR calc non Af Amer 37 (L) >60 mL/min   GFR calc Af Amer 43 (L) >60 mL/min    Comment: (NOTE) The eGFR has been calculated using the CKD EPI equation. This calculation has not been validated in all clinical  situations. eGFR's persistently <60 mL/min signify possible Chronic Kidney Disease.    Anion  gap 10 5 - 15    Comment: Performed at [facility], [place] [address] [place], [place] [address]  Triglycerides     Status: Abnormal   Collection Time: 02/08/18  4:23 AM  Result Value Ref Range   Triglycerides 161 (H) <150 mg/dL    Comment: Performed at [facility] [address], [place], [place] [address]  Glucose, capillary     Status: Abnormal   Collection Time: 02/08/18  7:50 AM  Result Value Ref Range   Glucose-Capillary 126 (H) 65 - 99 mg/dL   Comment 1 Notify RN    Comment 2 Document in Chart   Glucose, capillary     Status: None   Collection Time: 02/08/18 11:43 AM  Result Value Ref Range   Glucose-Capillary 99 65 - 99 mg/dL   Comment 1 Notify RN    Comment 2 Document in Chart   Glucose, capillary     Status: Abnormal   Collection Time: 02/08/18  3:39 PM  Result Value Ref Range   Glucose-Capillary 117 (H) 65 - 99 mg/dL   Comment 1 Notify RN    Comment 2 Document in Chart   Magnesium     Status: None   Collection Time: 02/08/18  4:29 PM  Result Value Ref Range   Magnesium 2.3 1.7 - 2.4 mg/dL    Comment: Performed at [facility], [place] [address], [place], [place] [address]  Phosphorus     Status: Abnormal   Collection Time: 02/08/18  4:29 PM  Result Value Ref Range   Phosphorus 5.5 (H) 2.5 - 4.6 mg/dL    Comment: Performed at [facility] [address], [place], [place] [address]  Glucose, capillary     Status: None   Collection Time: 02/08/18  7:27 PM  Result Value Ref Range   Glucose-Capillary 92 65 - 99 mg/dL  Glucose, capillary     Status: None   Collection Time: 02/08/18 11:21 PM  Result Value Ref Range   Glucose-Capillary 94 65 - 99 mg/dL  Glucose, capillary     Status: None   Collection Time: 02/09/18  3:24 AM  Result Value Ref Range   Glucose-Capillary 94 65 - 99 mg/dL  Basic metabolic panel     Status: Abnormal   Collection Time:  02/09/18  5:40 AM  Result Value Ref Range   Sodium 142 135 - 145 mmol/L   Potassium 4.1 3.5 - 5.1 mmol/L   Chloride 106 101 - 111 mmol/L   CO2 25 22 - 32 mmol/L   Glucose, Bld 108 (H) 65 - 99 mg/dL   BUN 37 (H) 6 - 20 mg/dL   Creatinine, Ser 1.85 (H) 0.61 - 1.24 mg/dL   Calcium 8.9 8.9 - 10.3 mg/dL   GFR calc non Af Amer 40 (L) >60 mL/min   GFR calc Af Amer 47 (L) >60 mL/min    Comment: (NOTE) The eGFR has been calculated using the CKD EPI equation. This calculation has not been validated in all clinical situations. eGFR's persistently <60 mL/min signify possible Chronic Kidney Disease.    Anion gap 11 5 - 15    Comment: Performed at [facility] [address] [place], [place] [address]  CBC with Differential/Platelet     Status: Abnormal   Collection Time: 02/09/18  5:40 AM  Result Value Ref Range   WBC 8.8 4.0 - 10.5 K/uL   RBC 3.67 (L) 4.22 - 5.81 MIL/uL  Hemoglobin 11.1 (L) 13.0 - 17.0 g/dL   HCT 35.2 (L) 39.0 - 52.0 %   MCV 95.9 78.0 - 100.0 fL   MCH 30.2 26.0 - 34.0 pg   MCHC 31.5 30.0 - 36.0 g/dL   RDW 14.5 11.5 - 15.5 %   Platelets 285 150 - 400 K/uL   Neutrophils Relative % 71 %   Neutro Abs 6.3 1.7 - 7.7 K/uL   Lymphocytes Relative 15 %   Lymphs Abs 1.3 0.7 - 4.0 K/uL   Monocytes Relative 10 %   Monocytes Absolute 0.9 0.1 - 1.0 K/uL   Eosinophils Relative 4 %   Eosinophils Absolute 0.4 0.0 - 0.7 K/uL   Basophils Relative 0 %   Basophils Absolute 0.0 0.0 - 0.1 K/uL    Comment: Performed at [facility] [address] [place], [place] [address]  Glucose, capillary     Status: Abnormal   Collection Time: 02/09/18  7:46 AM  Result Value Ref Range   Glucose-Capillary 133 (H) 65 - 99 mg/dL  Glucose, capillary     Status: None   Collection Time: 02/09/18 12:27 PM  Result Value Ref Range   Glucose-Capillary 91 65 - 99 mg/dL  Glucose, capillary     Status: Abnormal   Collection Time: 02/09/18  4:03 PM  Result Value Ref Range   Glucose-Capillary  198 (H) 65 - 99 mg/dL   Dg Chest Port 1 View  Result Date: 02/08/2018 CLINICAL DATA:  Respiratory failure EXAM: PORTABLE CHEST 1 VIEW COMPARISON:  02/07/2018 FINDINGS: Cardiac shadow remains enlarged. Endotracheal tube and nasogastric catheter are again noted in satisfactory position. Bibasilar atelectatic changes are again noted and stable. No new focal abnormality is seen. IMPRESSION: No significant interval change from the prior exam. Electronically Signed   By: Inez Catalina M.D.   On: 02/08/2018 08:42       Medical Problem List and Plan: 1.  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  DVT Prophylaxis/Anticoagulation: Pharmaceutical: Lovenox 3. Pain Management: Tylenol prn 4. Mood: LCSW to follow for evaluation and support.  5. Neuropsych: This patient is capable of making decisions on his own behalf. 6. Skin/Wound Care: routine pressure relief measures 7. Fluids/Electrolytes/Nutrition: Monitor I/O. Check lytes in am 8. CAD with NICM: On Lipitor, Imdur, lasix and hydralazine. Check weights daily. Monitor for signs of overload. Educate on importance of cessation of cocaine use.  9. History of Malignant HTN: Continues to be labile--monitor qid as still requiring IV labetalol. Labetalol 400 mg bid resumed today. Continue lasix, hydralazine, Imdur, lisinopril, spironolactone and Norvasc.  10. CKD: Monitor renal status with serial check. SCr trending down.  11. Left knee injury: No fracture but contusion? To continue KI with WBAT? Message left at Dr. Randel Pigg office.     12. Hyperlipidemia: Lipitor resumed   13. Acute respiratory failure: Will resume CPAP--encourage compliance as has not used it for past 6 months. He wants to try nasal mask.  Continue duo nebs tid. Encourage IS with flutter valve.  14. Obesity: BMI 34. Educate patient on importance of weight loss to promote overall health and mobility.  15. Prediabetes: Hgb A1C-6.1. Will  monitor BS ac/hs. Will consult RD to educate patient on appropriate diet.    Post Admission Physician Evaluation: 1. Functional deficits secondary  to left ponto-medullary hemorrhage. 2. Patient is admitted to receive collaborative, interdisciplinary care between the physiatrist, rehab nursing staff, and therapy team. 3. Patient's level of medical complexity  and substantial therapy needs in context of that medical necessity cannot be provided at a lesser intensity of care such as a SNF. 4. Patient has experienced substantial functional loss from his/her baseline which was documented above under the "Functional History" and "Functional Status" headings.  Judging by the patient's diagnosis, physical exam, and functional history, the patient has potential for functional progress which will result in measurable gains while on inpatient rehab.  These gains will be of substantial and practical use upon discharge  in facilitating mobility and self-care at the household level. 5. Physiatrist will provide 24 hour management of medical needs as well as oversight of the therapy plan/treatment and provide guidance as appropriate regarding the interaction of the two. 6. The Preadmission Screening has been reviewed and patient status is unchanged unless otherwise stated above. 7. 24 hour rehab nursing will assist with bladder management, bowel management, safety, skin/wound care, disease management, medication administration, pain management and patient education  and help integrate therapy concepts, techniques,education, etc. 8. PT will assess and treat for/with: Lower extremity strength, range of motion, stamina, balance, functional mobility, safety, adaptive techniques and equipment, NMR, family education.   Goals are: supervision to min assist. 9. OT will assess and treat for/with: ADL's, functional mobility, safety, upper extremity strength, adaptive techniques and equipment, NMR, family education.   Goals are:  supervision to min assist. Therapy may proceed with showering this patient. 10. SLP will assess and treat for/with: cognition, family education, swallowing, communication.  Goals are: supervision to min assist. 11. Case Management and Social Worker will assess and treat for psychological issues and discharge planning. 12. Team conference will be held weekly to assess progress toward goals and to determine barriers to discharge. 13. Patient will receive at least 3 hours of therapy per day at least 5 days per week. 14. ELOS: 18-22 days       15. Prognosis:  excellent     Meredith Staggers, MD, [facility] 02/15/2018  Bary Leriche, PA-C 02/09/2018

## 2018-02-09 NOTE — Progress Notes (Addendum)
STROKE TEAM PROGRESS NOTE   SUBJECTIVE (INTERVAL HISTORY) Patient sitting up in the bed.he was extubated yesterday and so far is tolerating it well Has double vision. Glasses w/ tape over R eye to help correct. Now off cardene. Therapy recommends CIR. Stable for xfer to floor today.   CBC:  Recent Labs  Lab 02/07/18 0826 02/08/18 0423 02/09/18 0540  WBC 9.1 8.2 8.8  NEUTROABS 6.2  --  6.3  HGB 12.0* 11.9* 11.1*  HCT 37.3* 37.3* 35.2*  MCV 93.7 94.9 95.9  PLT 290 257 285    Basic Metabolic Panel:  Recent Labs  Lab 02/08/18 0423 02/08/18 1629 02/09/18 0540  NA 143  --  142  K 4.1  --  4.1  CL 108  --  106  CO2 25  --  25  GLUCOSE 115*  --  108*  BUN 38*  --  37*  CREATININE 1.98*  --  1.85*  CALCIUM 9.2  --  8.9  MG 2.2 2.3  --   PHOS 4.3 5.5*  --     IMAGING None past 24h   PHYSICAL EXAM Vitals:   02/09/18 0700 02/09/18 0800 02/09/18 0815 02/09/18 0934  BP: (!) 172/97 (!) 172/90 (!) 172/90   Pulse: 86  88   Resp: (!) 27  (!) 25   Temp:  99.2 F (37.3 C)    TempSrc:  Oral    SpO2: 100%  98% 98%  Weight:      Height:       Pleasant middle-aged African-American gentleman in no acute distress. Afebrile. Head is nontraumatic. Neck is supple without bruit. Cardiac exam no murmur or gallop. Lungs are clear to auscultation. Distal pulses are well felt. Neurological Exam :  A&O x 3. follows commands.  Forced R ight gaze preference.  Left gaze palsy.  Nystagmus when he looks to the right, up and down. R hypertrophia. Pupils 3 mm equal reactive. Face is symmetric. Tongue midline.  Motor system exam patient is able to move all 4 extremities against gravity but moves left side more than right.  Deep tendon reflexes are 2+ symmetric.  Plantars are downgoing.  Sensation appears preserved bilaterally.  Gait not tested.   ASSESSMENT/PLAN [name] is a 52 y.o. male with history of coronary artery disease, cardiomyopathy, congestive heart failure, chronic kidney  disease, hypertension, medical noncompliance, obesity, obstructive sleep apnea, previous tobacco use, and previous cocaine use presenting with a severe headache, diplopia, gaze deviation, elevated blood pressure, and dysarthria.   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  Repeat CT head 02/06/18 stable  Carotid Doppler -B ICA 1-39% stenosis, VAs antegrade   2D ECHO : EF 45 -50% diffuse hypokinesis  UDS - positive for cocaine  LDL - 46  HgbA1c - 6.1  VTE prophylaxis - SCDs changed to lovenox (hmg is stable and it is safe to do this) Fall precautions Diet NPO time specified Except for: [employer], Other (See Comments). ST to assess swallow  aspirin 81 mg daily prior to admission, now on No antithrombotic  Ongoing aggressive stroke risk factor management  Therapy recommendations:  CIR  Disposition:  Pending  Transfer to the floor  Hypertension  Remains elevated in the 170s  SBP goal < 180  PTA meds:  norvasc 10, lasix 40, hydralazine 100 tid, imdur 60, labetalol 200 bid  Now on:  norvasc 10, lasix 40, hydralazine 100 tid, imdur 60  Long-term BP goal normotensive  Hyperlipidemia  Home meds: Lipitor 80 mg daily prior to admission now on hold secondary to hemorrhage.  LDL 46, goal < 70  Resume statin at discharge  Other Stroke Risk Factors  Former cigarette smoker - quit  History of drug use - UDS positive for cocaine  ETOH < 10  Obesity, Body mass index is 34.25 kg/m., recommend weight loss,  diet and exercise as appropriate   Coronary artery disease  Hx nonischemic cardiomyopathy  Obstructive sleep apnea   Other Active Problems  CKD - BUN 23 ; creatinine 1.97->1.85  Hyperglycemia -hemoglobin A1c 6.1  Rhoderick Moody [facility] See Amion for Pager information 02/09/2018 2:26 PM   Hospital day # 4 I have personally examined this patient, reviewed notes, independently viewed imaging studies, participated in medical decision making and plan of care.ROS completed by me personally and pertinent positives fully documented  I have made any additions or clarifications directly to the above note. Agree with note above. Continue strict blood pressure control. Mobilize out of bed and therapy consults. Transfer to neurology floor bed. Speech therapy to follow swallow eval of. Hopefully transfer to rehabilitation over the next few days. Patient counseled to be compliant with medications and to quit cocaine and make lifestyle changes This patient is critically ill and at significant risk of neurological worsening, death and care requires constant monitoring of vital signs, hemodynamics,respiratory and cardiac monitoring, extensive review of multiple databases, frequent neurological assessment, discussion with family, other specialists and medical decision making of high complexity.I have made any additions or clarifications directly to the above note.This critical care time does not reflect procedure time, or teaching time or supervisory time of PA/NP/Med Resident etc but could involve care discussion time.  I spent 30 minutes of neurocritical care time  in the care of  this patient.      Delia Heady, MD Medical Director [facility] Stroke Center Pager: [phone] 02/09/2018 3:09 PM     To contact Stroke Continuity provider, please refer to [email or web address]. After hours, contact General Neurology

## 2018-02-09 NOTE — Progress Notes (Signed)
PT Cancellation Note  Patient Details Name: [name] MRN: [id number] DOB: March 17, 1966   Cancelled Treatment:    Reason Eval/Treat Not Completed: Patient at procedure or test/unavailable. Working with SLP will reattempt as able   Tyaisha Cullom B Doryce Mcgregory 02/09/2018, 1:18 PM Delaney Meigs, PT [phone]

## 2018-02-09 NOTE — Progress Notes (Signed)
Pt admitted to the unit from 4N as a transfer. Pt arrived to the unit via bed with IV intact and transfusing along with family at bedside. Pt oriented to the unit and room; fall/safety precaution and prevention education completed with pt and family at bedside. Pt skin dry and intact with no pressure ulcer or opened wound noted. Call light within reach and bed alarm on. Will continue to closely monitor pt. Dionne Bucy RN

## 2018-02-10 ENCOUNTER — Encounter (HOSPITAL_COMMUNITY): Payer: Self-pay

## 2018-02-10 ENCOUNTER — Other Ambulatory Visit: Payer: Self-pay

## 2018-02-10 ENCOUNTER — Telehealth (INDEPENDENT_AMBULATORY_CARE_PROVIDER_SITE_OTHER): Payer: Self-pay | Admitting: Orthopedic Surgery

## 2018-02-10 ENCOUNTER — Inpatient Hospital Stay (HOSPITAL_COMMUNITY)
Admission: RE | Admit: 2018-02-10 | Discharge: 2018-02-20 | DRG: 057 | Disposition: E | Payer: Medicaid Other | Source: Intra-hospital | Attending: Physical Medicine & Rehabilitation | Admitting: Physical Medicine & Rehabilitation

## 2018-02-10 DIAGNOSIS — I469 Cardiac arrest, cause unspecified: Secondary | ICD-10-CM | POA: Diagnosis not present

## 2018-02-10 DIAGNOSIS — R278 Other lack of coordination: Secondary | ICD-10-CM | POA: Diagnosis present

## 2018-02-10 DIAGNOSIS — R7303 Prediabetes: Secondary | ICD-10-CM | POA: Diagnosis present

## 2018-02-10 DIAGNOSIS — H532 Diplopia: Secondary | ICD-10-CM | POA: Diagnosis present

## 2018-02-10 DIAGNOSIS — Z832 Family history of diseases of the blood and blood-forming organs and certain disorders involving the immune mechanism: Secondary | ICD-10-CM

## 2018-02-10 DIAGNOSIS — R414 Neurologic neglect syndrome: Secondary | ICD-10-CM | POA: Diagnosis present

## 2018-02-10 DIAGNOSIS — I5042 Chronic combined systolic (congestive) and diastolic (congestive) heart failure: Secondary | ICD-10-CM | POA: Diagnosis present

## 2018-02-10 DIAGNOSIS — Z6834 Body mass index (BMI) 34.0-34.9, adult: Secondary | ICD-10-CM

## 2018-02-10 DIAGNOSIS — Z87891 Personal history of nicotine dependence: Secondary | ICD-10-CM

## 2018-02-10 DIAGNOSIS — I10 Essential (primary) hypertension: Secondary | ICD-10-CM | POA: Diagnosis present

## 2018-02-10 DIAGNOSIS — I130 Hypertensive heart and chronic kidney disease with heart failure and stage 1 through stage 4 chronic kidney disease, or unspecified chronic kidney disease: Secondary | ICD-10-CM | POA: Diagnosis present

## 2018-02-10 DIAGNOSIS — N183 Chronic kidney disease, stage 3 unspecified: Secondary | ICD-10-CM | POA: Diagnosis present

## 2018-02-10 DIAGNOSIS — I69112 Visuospatial deficit and spatial neglect following nontraumatic intracerebral hemorrhage: Secondary | ICD-10-CM | POA: Diagnosis not present

## 2018-02-10 DIAGNOSIS — I2510 Atherosclerotic heart disease of native coronary artery without angina pectoris: Secondary | ICD-10-CM | POA: Diagnosis present

## 2018-02-10 DIAGNOSIS — R58 Hemorrhage, not elsewhere classified: Secondary | ICD-10-CM

## 2018-02-10 DIAGNOSIS — I69198 Other sequelae of nontraumatic intracerebral hemorrhage: Secondary | ICD-10-CM

## 2018-02-10 DIAGNOSIS — F1410 Cocaine abuse, uncomplicated: Secondary | ICD-10-CM

## 2018-02-10 DIAGNOSIS — Z8249 Family history of ischemic heart disease and other diseases of the circulatory system: Secondary | ICD-10-CM | POA: Diagnosis not present

## 2018-02-10 DIAGNOSIS — E785 Hyperlipidemia, unspecified: Secondary | ICD-10-CM | POA: Diagnosis present

## 2018-02-10 DIAGNOSIS — I613 Nontraumatic intracerebral hemorrhage in brain stem: Secondary | ICD-10-CM | POA: Diagnosis present

## 2018-02-10 DIAGNOSIS — G4730 Sleep apnea, unspecified: Secondary | ICD-10-CM | POA: Diagnosis present

## 2018-02-10 DIAGNOSIS — E669 Obesity, unspecified: Secondary | ICD-10-CM | POA: Diagnosis present

## 2018-02-10 DIAGNOSIS — I619 Nontraumatic intracerebral hemorrhage, unspecified: Secondary | ICD-10-CM

## 2018-02-10 LAB — GLUCOSE, CAPILLARY
GLUCOSE-CAPILLARY: 124 mg/dL — AB (ref 65–99)
Glucose-Capillary: 131 mg/dL — ABNORMAL HIGH (ref 65–99)
Glucose-Capillary: 120 mg/dL — ABNORMAL HIGH (ref 65–99)
Glucose-Capillary: 142 mg/dL — ABNORMAL HIGH (ref 65–99)

## 2018-02-10 LAB — BASIC METABOLIC PANEL
Anion gap: 14 (ref 5–15)
BUN: 27 mg/dL — AB (ref 6–20)
CHLORIDE: 101 mmol/L (ref 101–111)
CO2: 24 mmol/L (ref 22–32)
CREATININE: 1.77 mg/dL — AB (ref 0.61–1.24)
Calcium: 9.3 mg/dL (ref 8.9–10.3)
GFR calc non Af Amer: 42 mL/min — ABNORMAL LOW (ref >60)
GFR, EST AFRICAN AMERICAN: 49 mL/min — AB (ref >60)
Glucose, Bld: 129 mg/dL — ABNORMAL HIGH (ref 65–99)
Potassium: 3.9 mmol/L (ref 3.5–5.1)
SODIUM: 139 mmol/L (ref 135–145)

## 2018-02-10 MED ORDER — RESOURCE THICKENUP CLEAR PO POWD
ORAL | Status: DC | PRN
  Filled 2018-02-10: qty 125

## 2018-02-10 MED ORDER — DIPHENHYDRAMINE HCL 12.5 MG/5ML PO ELIX: 12.5000 mg | ORAL_SOLUTION | Freq: Four times a day (QID) | ORAL | Status: DC | PRN

## 2018-02-10 MED ORDER — POLYETHYLENE GLYCOL 3350 17 G PO PACK: 17.0000 g | PACK | Freq: Every day | ORAL | Status: DC | PRN

## 2018-02-10 MED ORDER — SENNOSIDES-DOCUSATE SODIUM 8.6-50 MG PO TABS: 1.0000 | ORAL_TABLET | Freq: Two times a day (BID) | ORAL | Status: DC

## 2018-02-10 MED ORDER — BISACODYL 10 MG RE SUPP
10.0000 mg | Freq: Every day | RECTAL | Status: DC | PRN
  Administered 2018-02-10: 10 mg via RECTAL
  Filled 2018-02-10: qty 1

## 2018-02-10 MED ORDER — ISOSORBIDE MONONITRATE ER 30 MG PO TB24: 60.0000 mg | ORAL_TABLET | Freq: Every day | ORAL | Status: DC

## 2018-02-10 MED ORDER — LABETALOL HCL 200 MG PO TABS
400.0000 mg | ORAL_TABLET | Freq: Two times a day (BID) | ORAL | Status: DC
  Administered 2018-02-10: 400 mg via ORAL
  Filled 2018-02-10: qty 2

## 2018-02-10 MED ORDER — SPIRONOLACTONE 25 MG PO TABS: 50.0000 mg | ORAL_TABLET | Freq: Every day | ORAL | Status: DC

## 2018-02-10 MED ORDER — CLONIDINE HCL 0.1 MG PO TABS: 0.1000 mg | ORAL_TABLET | Freq: Four times a day (QID) | ORAL | Status: DC | PRN

## 2018-02-10 MED ORDER — IPRATROPIUM-ALBUTEROL 0.5-2.5 (3) MG/3ML IN SOLN
3.0000 mL | Freq: Three times a day (TID) | RESPIRATORY_TRACT | Status: DC
  Administered 2018-02-10: 3 mL via RESPIRATORY_TRACT
  Filled 2018-02-10: qty 3

## 2018-02-10 MED ORDER — INSULIN ASPART 100 UNIT/ML ~~LOC~~ SOLN: 0.0000 [IU] | Freq: Every day | SUBCUTANEOUS | Status: DC

## 2018-02-10 MED ORDER — INSULIN ASPART 100 UNIT/ML ~~LOC~~ SOLN: 0.0000 [IU] | Freq: Three times a day (TID) | SUBCUTANEOUS | Status: DC

## 2018-02-10 MED ORDER — ADULT MULTIVITAMIN W/MINERALS CH: 1.0000 | ORAL_TABLET | Freq: Every day | ORAL | Status: DC

## 2018-02-10 MED ORDER — GUAIFENESIN-DM 100-10 MG/5ML PO SYRP: 5.0000 mL | ORAL_SOLUTION | Freq: Four times a day (QID) | ORAL | Status: DC | PRN

## 2018-02-10 MED ORDER — FUROSEMIDE 40 MG PO TABS
40.0000 mg | ORAL_TABLET | Freq: Every day | ORAL | Status: DC
Start: 2018-02-11 — End: 2018-02-11

## 2018-02-10 MED ORDER — ENOXAPARIN SODIUM 40 MG/0.4ML ~~LOC~~ SOLN
40.0000 mg | SUBCUTANEOUS | Status: DC
  Administered 2018-02-10: 40 mg via SUBCUTANEOUS
  Filled 2018-02-10: qty 0.4

## 2018-02-10 MED ORDER — PROCHLORPERAZINE EDISYLATE 5 MG/ML IJ SOLN: 5.0000 mg | Freq: Four times a day (QID) | INTRAMUSCULAR | Status: DC | PRN

## 2018-02-10 MED ORDER — ACETAMINOPHEN 325 MG PO TABS: 325.0000 mg | ORAL_TABLET | ORAL | Status: DC | PRN

## 2018-02-10 MED ORDER — PROCHLORPERAZINE 25 MG RE SUPP: 12.5000 mg | Freq: Four times a day (QID) | RECTAL | Status: DC | PRN

## 2018-02-10 MED ORDER — ATORVASTATIN CALCIUM 80 MG PO TABS
80.0000 mg | ORAL_TABLET | Freq: Every day | ORAL | Status: DC
  Administered 2018-02-10: 80 mg via ORAL
  Filled 2018-02-10: qty 1

## 2018-02-10 MED ORDER — SENNOSIDES-DOCUSATE SODIUM 8.6-50 MG PO TABS
2.0000 | ORAL_TABLET | Freq: Every day | ORAL | Status: DC
  Administered 2018-02-10: 2 via ORAL
  Filled 2018-02-10: qty 2

## 2018-02-10 MED ORDER — TRAZODONE HCL 50 MG PO TABS: 25.0000 mg | ORAL_TABLET | Freq: Every evening | ORAL | Status: DC | PRN

## 2018-02-10 MED ORDER — PANTOPRAZOLE SODIUM 40 MG PO TBEC
40.0000 mg | DELAYED_RELEASE_TABLET | Freq: Every day | ORAL | Status: DC
  Administered 2018-02-10: 40 mg via ORAL
  Filled 2018-02-10: qty 1

## 2018-02-10 MED ORDER — PANTOPRAZOLE SODIUM 40 MG PO TBEC: 40.0000 mg | DELAYED_RELEASE_TABLET | Freq: Every day | ORAL | Status: DC

## 2018-02-10 MED ORDER — PROCHLORPERAZINE MALEATE 5 MG PO TABS: 5.0000 mg | ORAL_TABLET | Freq: Four times a day (QID) | ORAL | Status: DC | PRN

## 2018-02-10 MED ORDER — AMLODIPINE BESYLATE 10 MG PO TABS: 10.0000 mg | ORAL_TABLET | Freq: Every day | ORAL | Status: DC

## 2018-02-10 MED ORDER — HYDRALAZINE HCL 50 MG PO TABS
100.0000 mg | ORAL_TABLET | Freq: Three times a day (TID) | ORAL | Status: DC
  Administered 2018-02-10: 100 mg via ORAL
  Filled 2018-02-10: qty 2

## 2018-02-10 MED ORDER — ATORVASTATIN CALCIUM 80 MG PO TABS: 80.0000 mg | ORAL_TABLET | Freq: Every day | ORAL | Status: DC

## 2018-02-10 MED ORDER — ALUM & MAG HYDROXIDE-SIMETH 200-200-20 MG/5ML PO SUSP: 30.0000 mL | ORAL | Status: DC | PRN

## 2018-02-10 MED ORDER — LISINOPRIL 10 MG PO TABS: 10.0000 mg | ORAL_TABLET | Freq: Every day | ORAL | Status: DC

## 2018-02-10 MED ORDER — FLEET ENEMA 7-19 GM/118ML RE ENEM: 1.0000 | ENEMA | Freq: Once | RECTAL | Status: DC | PRN

## 2018-02-10 NOTE — Progress Notes (Signed)
Physical Therapy Treatment Patient Details Name: [name] MRN: [id number] DOB: Apr 14, 1966 Today's Date: 03/03/18    History of Present Illness [name] is an 52 y.o. male past medical history of coronary  Artery disease, chronic heart failure, CKD, hypertension presents to [facility] emergency room . Pt presented with HA and double vision. 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     PT Comments    Pt with great progress towards his goals today, however continues to be limited in his safe mobility by diplopia and associated dizziness as well as L sided weakness and ataxia.  Pt is currently minAx2 for transfers and modAx2 progressing to minAx2 for ambulation of 6 bouts of 12 feet. Pt benefits from gaze stabilization with ambulation for decreased dizziness and decreased anxiety. Pt continues to be an excellent candidate for CIR level rehab at d/c.    Follow Up Recommendations  CIR     Equipment Recommendations  Other (comment)(TBD)    Recommendations for Other Services       Precautions / Restrictions Precautions Precautions: Fall Precaution Comments: diplopia and nystagmus, decreased acuity  Restrictions Weight Bearing Restrictions: Yes RLE Weight Bearing: Weight bearing as tolerated Other Position/Activity Restrictions: Per family, NWB with KI at RLE due to recent injury before admission. Awaiting MD clearance for WBing status. Use conservative protocal.     Mobility  Bed Mobility               General bed mobility comments: up in chair   Transfers Overall transfer level: Needs assistance Equipment used: Rolling walker (2 wheeled) Transfers: Sit to/from [employer] Sit to Stand: Min assist;+2 safety/equipment Stand pivot transfers: Min assist;+2 physical assistance;+2 safety/equipment       General transfer comment: Min A to steady.  Pt with ataxia Rt UE, requiring min facilitation of Rt hand on walker to maintain control.  Pt  benefits from use of visual target to fixate gaze during mobility   Ambulation/Gait Ambulation/Gait assistance: Min assist;Mod assist;+2 physical assistance;+2 safety/equipment Ambulation Distance (Feet): 12 Feet(6 x 12 feet) Assistive device: Rolling walker (2 wheeled) Gait Pattern/deviations: Step-through pattern;Decreased step length - right;Decreased step length - left;Narrow base of support;Trunk flexed;Staggering left;Decreased weight shift to right Gait velocity: slowed Gait velocity interpretation: Below normal speed for age/gender General Gait Details: modAx2 progressing to minAx2, pt educated on need to stabilize vision to minimize dizziness, pt very anxious however became more confident with additional bouts of walking, pt with L sided lean and drifting to L with ambulation, required assist on R side to keep UE on RW    Modified Rankin (Stroke Patients Only) Modified Rankin (Stroke Patients Only) Pre-Morbid Rankin Score: No symptoms Modified Rankin: Moderately severe disability     Balance Overall balance assessment: Needs assistance Sitting-balance support: Feet supported Sitting balance-Leahy Scale: Fair Sitting balance - Comments: Pt able to move ~23-" off BOS without LOB    Standing balance support: Bilateral upper extremity supported Standing balance-Leahy Scale: Poor Standing balance comment: reliant on UE support and min A                             Cognition Arousal/Alertness: Awake/alert Behavior During Therapy: WFL for tasks assessed/performed;Anxious Overall Cognitive Status: Within Functional Limits for tasks assessed  General Comments: Pt mildly anxious with activity due to dizziness       Exercises      General Comments General comments (skin integrity, edema, etc.): aunt present during session      Pertinent Vitals/Pain Pain Assessment: Faces Faces Pain Scale: No hurt           PT  Goals (current goals can now be found in the care plan section) Acute Rehab PT Goals PT Goal Formulation: With patient Time For Goal Achievement: 02/21/18 Potential to Achieve Goals: Good Progress towards PT goals: Progressing toward goals    Frequency    Min 4X/week      PT Plan Current plan remains appropriate    Co-evaluation PT/OT/SLP Co-Evaluation/Treatment: Yes Reason for Co-Treatment: Complexity of the patient's impairments (multi-system involvement) PT goals addressed during session: Mobility/safety with mobility;Balance OT goals addressed during session: ADL's and self-care      AM-PAC PT "6 Clicks" Daily Activity  Outcome Measure  Difficulty turning over in bed (including adjusting bedclothes, sheets and blankets)?: Unable Difficulty moving from lying on back to sitting on the side of the bed? : Unable Difficulty sitting down on and standing up from a chair with arms (e.g., wheelchair, bedside commode, etc,.)?: Unable Help needed moving to and from a bed to chair (including a wheelchair)?: A Lot Help needed walking in hospital room?: A Lot Help needed climbing 3-5 steps with a railing? : Total 6 Click Score: 8    End of Session Equipment Utilized During Treatment: Gait belt Activity Tolerance: Patient tolerated treatment well Patient left: in chair;with call bell/phone within reach Nurse Communication: Mobility status PT Visit Diagnosis: Other abnormalities of gait and mobility (R26.89);Pain;Dizziness and giddiness (R42);Other symptoms and signs involving the nervous system (P53.748)     Time: 2707-8675 PT Time Calculation (min) (ACUTE ONLY): 35 min  Charges:  $Gait Training: 8-22 mins                    G Codes:       [name] B. Beverely Risen PT, DPT Acute Rehabilitation  [phone] Pager [phone]     [name] 2018-02-22, 5:10 PM

## 2018-02-10 NOTE — H&P (Signed)
Physical Medicine and Rehabilitation Admission H&P     Chief Complaint  Patient presents with  . Functional deficits due to stroke  HPI: [name] is a 52 y.o. male with history of CAD, hyperaldosteronism, CKD, HTN, NICM, recent R-Knee Fx?, medication noncompliance; who was admitted on 02/05/2018 with headache double vision and dysarthria. 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 UDS was positive for cocaine. He was started on Cardene drip due to elevated blood pressure. MRI MRA of the brain showed acute left paramedian pontomedullary hemorrhage roughly same size, mass-effect on fourth ventricle and aqueduct with concerns for development of hydrocephalus, 75% stenosis of distal left vertebral artery and no saccular aneurysm or vascular malformation. Repeat CT of head 3/18 showed no significant increase in size of bleed. Carotid Doppler showed no significant ICA stenosis. 2D echo showed EF of 45-50% with mild diffuse hypokinesis no thrombus moderately dilated left atrium. Dr. Wynetta Emery was consulted for input and felt no surgical intervention needed and patient tolerated extubation by 3/19. Dr. 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 He tolerated extubation on 03/21 and was started on regular textures with nectars.  Therapy ongoing and patient with resultant right gaze preference with left neglect, double vision and poor sitting balance. CIR recommended for follow-up therapy due to functional deficits.     Review of Systems  Constitutional: Negative for chills and fever.  HENT: Negative for hearing loss and tinnitus.  Eyes: Positive for double vision. Negative for photophobia.  Visual hallucinations "seeing all types of things"  Respiratory: Negative for cough and shortness of breath.  Gastrointestinal: Positive for constipation. Negative for heartburn and  nausea.  Genitourinary: Negative for dysuria and urgency.  Musculoskeletal: Negative for back pain, joint pain and myalgias.  Skin: Negative for rash.  Neurological: Positive for speech change and focal weakness. Negative for dizziness, sensory change and headaches.  Psychiatric/Behavioral: The patient is nervous/anxious and has insomnia.       Past Medical History:  Diagnosis Date  . CAD (coronary artery disease) 02/18/2009   Small OM occlusion with collaterals, 60% RCA, 40% LAD. April 2015   . Cardiomyopathy, nonischemic (HCC) 05/03/2016  . CHF (congestive heart failure) (HCC)   . CKD (chronic kidney disease)   . History of syncope 08/2008   Secondary to hypertension  . Hypertension, essential, benign    a. renal art [place] (6/14): no RAS  . Hypokalemia 05/03/2016  . Noncompliance   . Nonischemic cardiomyopathy (HCC)    EF 45%  . Obesity   . Sleep apnea   . Tobacco abuse         Past Surgical History:  Procedure Laterality Date  . CARDIAC CATHETERIZATION N/A 12/22/2015   Procedure: Left Heart Cath and Coronary Angiography; Surgeon: Lyn Records, MD; Location: [facility] INVASIVE CV LAB; Service: Cardiovascular; Laterality: N/A;  . IR US GUIDE VASC ACCESS RIGHT  04/14/2017  . IR VENOGRAM ADRENAL BI  04/14/2017  . IR VENOGRAM RENAL BI  04/14/2017  . IR VENOUS SAMPLING  04/14/2017  . IR VENOUS SAMPLING  04/14/2017  . LEFT HEART CATHETERIZATION WITH CORONARY ANGIOGRAM N/A 02/21/2014   Procedure: LEFT HEART CATHETERIZATION WITH CORONARY ANGIOGRAM; Surgeon: Kathleene Hazel, MD; Location: [facility] CATH LAB; Service: Cardiovascular; Laterality: N/A;  . None          Family History  Problem Relation Age of Onset  . Lupus  Mother   . Heart disease Maternal Grandfather   . Coronary artery disease Neg Hx   . Adrenal disorder Neg Hx    Social History: Lives with girlfriend. Is a disabled cook. Girlfriend works days as a Lawyer. Per reports that he has quit smoking. His smoking use included cigarettes.  He has a 3.20 pack-year smoking history. he has never used smokeless tobacco. Per reports he does not drink alcohol or use drugs.       Allergies  Allergen Reactions  . Penicillins Other (See Comments)    Makes his throat itch really bad, and feels like it is closing up.  Has patient had a PCN reaction causing immediate rash, facial/tongue/throat swelling, SOB or lightheadedness with hypotension: Yes  Has patient had a PCN reaction causing severe rash involving mucus membranes or skin necrosis: No  Has patient had a PCN reaction that required hospitalization: Yes  Has patient had a PCN reaction occurring within the last 10 years: No  If all of the above answers are "NO", then may proceed with Cephalospo  . Shrimp [Shellfish Allergy]     Throat swells         Medications Prior to Admission  Medication Sig Dispense Refill  . acetaminophen-codeine (TYLENOL #3) 300-30 MG tablet Take 1 tablet by mouth every 6 (six) hours as needed for moderate pain. 15 tablet 0  . amLODipine (NORVASC) 10 MG tablet Take 1 tablet (10 mg total) by mouth daily. 30 tablet 0  . aspirin EC 81 MG tablet Take 1 tablet (81 mg total) by mouth daily. 90 tablet 3  . atorvastatin (LIPITOR) 80 MG tablet Take 1 tablet (80 mg total) by mouth daily at 6 PM. 30 tablet 0  . furosemide (LASIX) 40 MG tablet Take 1 tablet (40 mg total) by mouth daily. 30 tablet 0  . hydrALAZINE (APRESOLINE) 100 MG tablet Take 1 tablet (100 mg total) by mouth 3 (three) times daily. 90 tablet 0  . HYDROcodone-acetaminophen (NORCO/VICODIN) 5-325 MG tablet Take 2 tablets by mouth every 6 (six) hours as needed. (Patient taking differently: Take 2 tablets by mouth every 6 (six) hours as needed for moderate pain. ) 16 tablet 0  . isosorbide mononitrate (IMDUR) 60 MG 24 hr tablet Take 1 tablet (60 mg total) by mouth daily. 30 tablet 0  . labetalol (NORMODYNE) 200 MG tablet Take 2 tablets (400 mg total) by mouth 2 (two) times daily. 90 tablet 0  .  nitroGLYCERIN (NITROSTAT) 0.4 MG SL tablet Place 1 tablet (0.4 mg total) under the tongue every 5 (five) minutes as needed. For chest pain 10 tablet 0  . potassium chloride SA (K-DUR,KLOR-CON) 20 MEQ tablet Take 2 tablets (40 mEq total) by mouth daily. 60 tablet 0  . spironolactone (ALDACTONE) 50 MG tablet Take 1 tablet (50 mg total) by mouth daily. 30 tablet 0   Drug Regimen Review  Drug regimen was reviewed and remains appropriate with no significant issues identified  Home:  Home Living  Family/patient expects to be discharged to:: Private residence  Living Arrangements: Spouse/significant other  Available Help at Discharge: Family, Available 24 hours/day  Type of Home: House  Home Access: Ramped entrance  Home Layout: Two level, Able to live on main level with bedroom/bathroom  Bathroom Shower/Tub: Pension scheme manager: Handicapped height  Home Equipment: None  Additional Comments: Planning to dc home with brother and have family switch in and out for 24 hour support  Functional History:  Prior Function  Level of Independence: Independent  Comments: ADLs and IADLs. Not driving and working and on disability  Functional Status:  Mobility:  Bed Mobility  Overal bed mobility: Needs Assistance  Bed Mobility: Supine to Sit, Sit to Supine  Supine to sit: +2 for safety/equipment, Min guard  Sit to supine: Mod assist, +2 for physical assistance  General bed mobility comments: Min Guard for safety  Transfers  Overall transfer level: Needs assistance  Equipment used: Rolling walker (2 wheeled)  Transfers: Sit to/from Stand, Pharmacologist  Sit to Stand: Max assist, +2 physical assistance  Stand pivot transfers: Max assist, +2 physical assistance  Lateral/Scoot Transfers: Min assist, +2 safety/equipment  General transfer comment: Pt requiring max verbal and tactile cues for placing LUE on RW. Requiring Max A +2 for balance during transfers. Maintaining NWB status until  cleared for RLE at baseline.  Ambulation/Gait  General Gait Details: NT   ADL:  ADL  Overall ADL's : Needs assistance/impaired  Eating/Feeding: Minimal assistance, Sitting  Eating/Feeding Details (indicate cue type and reason): Pt requiring Min A for opening apple sauce and holding cup. Pt requiring cues to problem solve bringing spoon to mouth; overshooting to left side of mouth.  Grooming: Wash/dry face, Sitting, Set up  Grooming Details (indicate cue type and reason): Wiping mouth after eating  Upper Body Bathing: Moderate assistance, Sitting  Lower Body Bathing: Maximal assistance, Bed level  Lower Body Bathing Details (indicate cue type and reason): Due to intubation, pt requiring Max A for LB bathing at bed level  Upper Body Dressing : Moderate assistance, Sitting  Lower Body Dressing: Maximal assistance, Bed level  Lower Body Dressing Details (indicate cue type and reason): donning socks at bed level. Pt able elevate BLEs off bed to assist with donning socks  Toilet Transfer: Maximal assistance, +2 for physical assistance, Stand-pivot(Simualted to recliner)  Toilet Transfer Details (indicate cue type and reason): 6  Functional mobility during ADLs: Maximal assistance, +2 for physical assistance, Rolling walker(Per pt's significant other; maintaining NWB RLE )  General ADL Comments: Providing pt with occulsion glasses for diplopia. After adjusting glasses from seeing quadruple vision to seeing one. Pt then performing self feeding with apple sauce. Continues to present with poor depth perception and propioception. Pt performing reading task with large letters, significant amount of time, and compensaotry head turn to left. Providing handout on visional occlusion.  Cognition:  Cognition  Overall Cognitive Status: Within Functional Limits for tasks assessed  Orientation Level: Oriented X4  Cognition  Arousal/Alertness: Awake/alert  Behavior During Therapy: WFL for tasks  assessed/performed  Overall Cognitive Status: Within Functional Limits for tasks assessed  General Comments: Continue to follow cues and present with [facility] for tasks completed. Will continue to assess     Blood pressure (!) 153/84, pulse (!) 115, temperature 98.6 F (37 C), temperature source Oral, resp. rate 16, height 5\' 9"  (1.753 m), weight 105.2 kg (231 lb 14.8 oz), SpO2 100 %.  Physical Exam  Nursing note and vitals reviewed.  Constitutional: He is oriented to person, place, and time. He appears well-developed and well-nourished. No distress.  HENT:  Head: Normocephalic and atraumatic.  Mouth/Throat: Oropharynx is clear and moist.  Eyes: Pupils are equal, round, and reactive to light. Conjunctivae are normal.  Neck: Normal range of motion. Neck supple.  Cardiovascular: Normal rate and regular rhythm.  No murmur heard.  Respiratory: Effort normal and breath sounds normal. No stridor. No respiratory distress.  GI: Soft. Bowel sounds are normal. He exhibits  no distension. There is no tenderness.  Musculoskeletal: He exhibits no edema.  Neurological: He is alert and oriented to person, place, and time.  Left facial weakness with moderate dysarthria. Left central 7/tonge deviation. Right gaze preference. Eyes fixed to right field with reports of diplopia and visual hallucinations. Able to follow basic motor commands without difficulty.  Skin: Skin is warm and dry. He is not diaphoretic.   Lab Results Last 48 Hours  Imaging Results (Last 48 hours)     Medical Problem List and Plan:  1. 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 DVT Prophylaxis/Anticoagulation: Pharmaceutical: Lovenox  3. Pain Management: Tylenol prn  4. Mood: LCSW to follow for evaluation and support.  5. Neuropsych: This patient is capable of making decisions on his own behalf.  6. Skin/Wound Care: routine pressure relief measures  7. Fluids/Electrolytes/Nutrition: Monitor I/O. Check lytes in am  8. CAD with NICM: On Lipitor, Imdur, lasix and hydralazine. Check weights daily. Monitor for signs of overload. Educate on importance of cessation of cocaine use.  9. History of Malignant HTN: Continues to be labile--monitor qid as still requiring IV labetalol. Labetalol 400 mg bid resumed today. Continue lasix, hydralazine, Imdur, lisinopril, spironolactone and Norvasc.  10. CKD: Monitor renal status with serial check. SCr trending down.  11. Left knee injury: No fracture but contusion? To continue KI with WBAT? Message left at Dr. Diamantina Providence office.  12. Hyperlipidemia: Lipitor resumed  13. Acute respiratory failure: Will resume CPAP--encourage compliance as has not used it for past 6 months. He wants to try nasal mask. Continue duo nebs tid. Encourage IS with flutter valve.  14. Obesity: BMI 34. Educate patient on importance of weight loss to promote overall health and mobility.  15. Prediabetes: Hgb A1C-6.1. Will  monitor BS ac/hs. Will consult RD to educate patient on appropriate diet.   Post Admission Physician Evaluation:  1. 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 2. Patient is admitted to receive collaborative, interdisciplinary care between the physiatrist, rehab nursing staff, and therapy team. 3. Patient's level of medical complexity and substantial therapy needs in context of that medical necessity cannot be provided at a lesser intensity of care such as a SNF. 4. Patient has experienced substantial functional loss from his/her baseline which was documented above under the "Functional History" and "Functional Status" headings. Judging by the patient's diagnosis, physical exam, and functional history, the patient has potential for functional progress which will result in measurable gains while on inpatient rehab. These gains will be of substantial and practical use upon discharge in facilitating mobility and self-care at the household level. 5. Physiatrist will provide 24 hour management of medical needs as well as oversight of the therapy plan/treatment and provide guidance as appropriate regarding the interaction of the two. 6. The Preadmission Screening has been reviewed and patient status is unchanged unless otherwise stated above. 7. 24 hour rehab nursing will assist with bladder management, bowel management, safety, skin/wound care, disease management, medication administration, pain management and patient education and help integrate therapy concepts, techniques,education, etc. 8. PT will assess and treat for/with: Lower extremity strength, range of motion, stamina, balance, functional mobility, safety, adaptive techniques and equipment, NMR, family education. Goals are: supervision to min assist. 9. OT will assess and treat for/with: ADL's, functional mobility, safety, upper extremity strength, adaptive techniques and equipment, NMR, family education. Goals are: supervision  to min assist. Therapy may proceed with showering this patient. 10. SLP will assess and treat for/with: cognition, family education, swallowing, communication. Goals are: supervision to min assist. 11. Case Management and Social Worker will assess and treat for psychological issues and discharge planning. 12. Team conference will be held weekly to assess progress toward goals and to determine barriers to discharge. 13. Patient will receive at least 3 hours of therapy per day at least 5 days per week. 14. ELOS: 18-22 days  15. Prognosis: excellent   Earna Coder T.  Riley Kill, MD, [facility]  [facility] Physical Medicine & Rehabilitation  01/26/2018  Jacquelynn Cree, PA-C  02/09/2018

## 2018-02-10 NOTE — PMR Pre-admission (Signed)
PMR Admission Coordinator Pre-Admission Assessment  Patient: [name] is an 52 y.o., male MRN: [id number] DOB: 12/25/1965 Height: 5\' 9"  (175.3 cm) Weight: 105.2 kg (231 lb 14.8 oz)          Insurance Information HMO: No   PPO:       PCP:       IPA:       80/20:       OTHER:   PRIMARY: Medicaid [place] access      Policy#: [id number]      Subscriber: patient CM Name:        Phone#:       Fax#:   Pre-Cert#:        Employer: Disabled Benefits:  Phone #: [phone]     Name: Automated Eff. Date: Eligible 02/08/18 with coverage code [facility]     Deduct:        Out of Pocket Max:        Life Max:   CIR:        SNF:   Outpatient:       Co-Pay:   Home Health:        Co-Pay:   DME:       Co-Pay:   Providers:    Medicaid Application Date:        Case Manager:   Disability Application Date:        Case Worker:    Emergency Contact Information Contact Information    Name Relation Home Work Mobile   Redd,Juanita Significant other [phone]     Joaquin, special Daughter   [phone]   da, authement Daughter   [phone]   Gar Ponto [phone]       Current Medical History  Patient Admitting Diagnosis: 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 UDS was positive for cocaine. He was started on Cardene drip due to elevated blood pressure. MRI MRA of the brain showed acute left paramedian pontomedullary hemorrhage roughly same size, mass-effect on fourth ventricle and aqueduct with concerns for development of hydrocephalus, 75% stenosis of distal left vertebral artery and no  saccular aneurysm or vascular malformation. Repeat CT of head 3/18 showed no significant increase in size of bleed. Carotid Doppler showed no significant ICA stenosis. 2D echo showed EF of 45-50% with mild diffuse hypokinesis no thrombus moderately dilated left atrium. Dr. Wynetta Emery was consulted for input and felt no surgical intervention needed and patient tolerated extubation by 3/19. Dr. 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 He tolerated extubation on 03/21 and was started on regular textures with nectars.   Therapy evaluations done yesterday revealing right gaze preference with left neglect, double vision and poor sitting balance. CIR recommended for follow-up therapy due to functional deficits.   Total: 7=NIH  Past Medical History  Past Medical History:  Diagnosis Date  . CAD (coronary artery disease) 02/18/2009   Small OM occlusion with collaterals, 60% RCA, 40% LAD. April 2015    . Cardiomyopathy, nonischemic (HCC) 05/03/2016  . CHF (congestive heart failure) (HCC)   . CKD (chronic kidney disease)   . History of syncope 08/2008   Secondary  to hypertension  . Hypertension, essential, benign    a. renal art [place] (6/14): no RAS  . Hypokalemia 05/03/2016  . Noncompliance   . Nonischemic cardiomyopathy (HCC)    EF 45%  . Obesity   . Sleep apnea   . Tobacco abuse     Family History  family history includes Heart disease in his maternal grandfather; Lupus in his mother.  Prior Rehab/Hospitalizations: No previous rehab  Has the patient had major surgery during 100 days prior to admission? No  Current Medications   Current Facility-Administered Medications:  .   stroke: mapping our early stages of recovery book, , Does not apply, Once, Biby, Sharon L, NP .  0.9 % NaCl with KCl 20 mEq/ L  infusion, , Intravenous, Continuous, Biby, Sharon L, NP, Last Rate: 50 mL/hr at 02/09/18 1938 .  acetaminophen (TYLENOL) tablet 650 mg, 650  mg, Oral, Q4H PRN, 650 mg at 2018/03/02 0045 **OR** acetaminophen (TYLENOL) solution 650 mg, 650 mg, Per Tube, Q4H PRN, 650 mg at 02/08/18 0755 **OR** acetaminophen (TYLENOL) suppository 650 mg, 650 mg, Rectal, Q4H PRN, Biby, Sharon L, NP .  amLODipine (NORVASC) tablet 10 mg, 10 mg, Oral, Daily, Biby, Sharon L, NP, 10 mg at 03-02-2018 0949 .  enoxaparin (LOVENOX) injection 40 mg, 40 mg, Subcutaneous, Q24H, Biby, Sharon L, NP, 40 mg at 03/02/18 0950 .  furosemide (LASIX) tablet 40 mg, 40 mg, Oral, Daily, Biby, Sharon L, NP, 40 mg at 03/02/2018 0949 .  hydrALAZINE (APRESOLINE) tablet 100 mg, 100 mg, Oral, TID, Layne Benton, NP, 100 mg at 03/02/2018 0949 .  insulin aspart (novoLOG) injection 0-9 Units, 0-9 Units, Subcutaneous, TID WC, Biby, Sharon L, NP, 1 Units at 02-Mar-2018 1246 .  ipratropium-albuterol (DUONEB) 0.5-2.5 (3) MG/3ML nebulizer solution 3 mL, 3 mL, Nebulization, TID, Micki Riley, MD, 3 mL at March 02, 2018 0724 .  isosorbide mononitrate (IMDUR) 24 hr tablet 60 mg, 60 mg, Oral, Daily, Biby, Sharon L, NP, 60 mg at 03/02/18 0949 .  labetalol (NORMODYNE,TRANDATE) injection 20-40 mg, 20-40 mg, Intravenous, Q2H PRN, Annie Main L, NP, 40 mg at 03-02-2018 0654 .  lisinopril (PRINIVIL,ZESTRIL) tablet 10 mg, 10 mg, Oral, Daily, Biby, Sharon L, NP, 10 mg at 03/02/18 0949 .  multivitamin with minerals tablet 1 tablet, 1 tablet, Oral, Daily, Biby, Sharon L, NP, 1 tablet at 2018-03-02 0949 .  pantoprazole (PROTONIX) EC tablet 40 mg, 40 mg, Oral, Daily, Micki Riley, MD, 40 mg at 2018/03/02 1331 .  RESOURCE THICKENUP CLEAR, , Oral, PRN, Biby, Sharon L, NP .  senna-docusate (Senokot-S) tablet 1 tablet, 1 tablet, Oral, BID, Layne Benton, NP, 1 tablet at 03/02/18 0949 .  spironolactone (ALDACTONE) tablet 50 mg, 50 mg, Oral, Daily, Biby, Sharon L, NP, 50 mg at 03-02-2018 6629  Patients Current Diet: Fall precautions DIET DYS 3 Room service appropriate? Yes; Fluid consistency: Nectar Thick  Precautions /  Restrictions Precautions Precautions: Other (comment), Fall(vent) Restrictions Weight Bearing Restrictions: No RLE Weight Bearing: Weight bearing as tolerated Other Position/Activity Restrictions: Per family, NWB with KI at RLE due to recent injury before admission. Awaiting MD clearance for WBing status. Use conservative protocal.    Has the patient had 2 or more falls or a fall with injury in the past year?No.  Patient reports 1 fall this past year.  Prior Activity Level Limited Community (1-2x/wk): Went out 3 X a week, was not driving.  Home Assistive Devices / Equipment Home Assistive Devices/Equipment: None Home Equipment: None  Prior  Device Use: Indicate devices/aids used by the patient prior to current illness, exacerbation or injury? Cane  Prior Functional Level Prior Function Level of Independence: Independent Comments: ADLs and IADLs. Not driving and working and on disability  Self Care: Did the patient need help bathing, dressing, using the toilet or eating?  Needed some help.  Needed help to put on shoes and socks  Indoor Mobility: Did the patient need assistance with walking from room to room (with or without device)? Independent  Stairs: Did the patient need assistance with internal or external stairs (with or without device)? Independent  Functional Cognition: Did the patient need help planning regular tasks such as shopping or remembering to take medications? Independent  Current Functional Level Cognition  Overall Cognitive Status: Within Functional Limits for tasks assessed Difficult to assess due to: Intubated Orientation Level: Oriented X4 General Comments: Continue to follow cues and present with [facility] for tasks completed. Will continue to assess    Extremity Assessment (includes Sensation/Coordination)  Upper Extremity Assessment: Defer to OT evaluation  Lower Extremity Assessment: Overall WFL for tasks assessed    ADLs  Overall ADL's : Needs  assistance/impaired Eating/Feeding: Minimal assistance, Sitting Eating/Feeding Details (indicate cue type and reason): Pt requiring Min A for opening apple sauce and holding cup. Pt requiring cues to problem solve bringing spoon to mouth; overshooting to left side of mouth.  Grooming: Wash/dry face, Sitting, Set up Grooming Details (indicate cue type and reason): Wiping mouth after eating Upper Body Bathing: Moderate assistance, Sitting Lower Body Bathing: Maximal assistance, Bed level Lower Body Bathing Details (indicate cue type and reason): Due to intubation, pt requiring Max A for LB bathing at bed level Upper Body Dressing : Moderate assistance, Sitting Lower Body Dressing: Maximal assistance, Bed level Lower Body Dressing Details (indicate cue type and reason): donning socks at bed level. Pt able elevate BLEs off bed to assist with donning socks Toilet Transfer: Maximal assistance, +2 for physical assistance, Stand-pivot(Simualted to recliner) Toilet Transfer Details (indicate cue type and reason): 6 Functional mobility during ADLs: Maximal assistance, +2 for physical assistance, Rolling walker(Per pt's significant other; maintaining NWB RLE ) General ADL Comments: Providing pt with occulsion glasses for diplopia. After adjusting glasses from seeing quadruple vision to seeing one. Pt then performing self feeding with apple sauce. Continues to present with poor depth perception and propioception. Pt performing reading task with large letters, significant amount of time, and compensaotry head turn to left. Providing handout on visional occlusion.    Mobility  Overal bed mobility: Needs Assistance Bed Mobility: Supine to Sit, Sit to Supine Supine to sit: +2 for safety/equipment, Min guard Sit to supine: Mod assist, +2 for physical assistance General bed mobility comments: Min Guard for safety    Transfers  Overall transfer level: Needs assistance Equipment used: Rolling walker (2  wheeled) Transfers: Sit to/from Stand, Pharmacologist Sit to Stand: Max assist, +2 physical assistance Stand pivot transfers: Max assist, +2 physical assistance  Lateral/Scoot Transfers: Min assist, +2 safety/equipment General transfer comment: Pt requiring max verbal and tactile cues for placing LUE on RW. Requiring Max A +2 for balance during transfers. Maintaining NWB status until cleared for RLE at baseline.     Ambulation / Gait / Stairs / Wheelchair Mobility  Ambulation/Gait General Gait Details: NT    Posture / Balance Dynamic Sitting Balance Sitting balance - Comments: Pt maintaining sitting balance at EOB without LOB during grooming and excercises Balance Overall balance assessment: Needs assistance Sitting-balance support: No  upper extremity supported, Feet unsupported Sitting balance-Leahy Scale: Fair Sitting balance - Comments: Pt maintaining sitting balance at EOB without LOB during grooming and excercises Standing balance support: Bilateral upper extremity supported, During functional activity Standing balance-Leahy Scale: Poor Standing balance comment: Reliant on UE support and physical A    Special needs/care consideration BiPAP/CPAP Yes, CPAP CPM No Continuous Drip IV 0.9% [place] with KCL 20 meq/L 50 mL/hr Dialysis No       Life Vest No Oxygen No  Special Bed No Trach Size No Wound Vac (area) No     Skin* No                           Bowel mgmt: No documented BM.  Last BM 02/04/18 Bladder mgmt: Voiding in urinal Diabetic mgmt No    Previous Home Environment Living Arrangements: Spouse/significant other Available Help at Discharge: Family, Available 24 hours/day Type of Home: House Home Layout: Two level, Able to live on main level with bedroom/bathroom Home Access: Ramped entrance Bathroom Shower/Tub: Health visitor: Handicapped height Home Care Services: No Additional Comments: Planning to dc home with brother and have family switch in  and out for 24 hour support  Discharge Living Setting Plans for Discharge Living Setting: Patient's home, House, Lives with (comment)(Lives with girlfriend.) Type of Home at Discharge: House Discharge Home Layout: Two level, Able to live on main level with bedroom/bathroom Alternate Level Stairs-Number of Steps: Flight Discharge Home Access: Stairs to enter, Ramped entrance Entrance Stairs-Number of Steps: 8-9 steps Does the patient have any problems obtaining your medications?: No  Social/Family/Support Systems Patient Roles: Parent, Other (Comment)(Has girlfriend, daughter, aunt.) Contact Information: Juanita Redd - significant other Anticipated Caregiver: Juanita Anticipated Industrial/product designer Information: Cleotis Lema [phone] Ability/Limitations of Caregiver: Girlfriend is not currently working and can assist. Caregiver Availability: 24/7 Discharge Plan Discussed with Primary Caregiver: Yes Is Caregiver In Agreement with Plan?: Yes Does Caregiver/Family have Issues with Lodging/Transportation while Pt is in Rehab?: No  Goals/Additional Needs Patient/Family Goal for Rehab: PT/OT/SLP supervision to min assist goals Expected length of stay: 18-22 days Cultural Considerations: [employer] Dietary Needs: Dys 3, nectar thick liquids Equipment Needs: TBD Pt/Family Agrees to Admission and willing to participate: Yes Program Orientation Provided & Reviewed with Pt/Caregiver Including Roles  & Responsibilities: Yes  Decrease burden of Care through IP rehab admission: N/A  Possible need for SNF placement upon discharge: Not anticipated  Patient Condition: This patient's medical and functional status has changed since the consult dated: 02/08/18 in which the Rehabilitation Physician determined and documented that the patient's condition is appropriate for intensive rehabilitative care in an inpatient rehabilitation facility. See "History of Present Illness" (above) for medical update.  Functional changes are: Currently requiring min assist +2 for transfers. Patient's medical and functional status update has been discussed with the Rehabilitation physician and patient remains appropriate for inpatient rehabilitation. Will admit to inpatient rehab today.  Preadmission Screen Completed By:  Trish Mage, 2018/02/16 1:45 PM ______________________________________________________________________   Discussed status with Dr. Riley Kill on 2018-02-16 at 1344 and received telephone approval for admission today.  Admission Coordinator:  Trish Mage, time 1344/Date Feb 16, 2018

## 2018-02-10 NOTE — Care Management Note (Signed)
Case Management Note  Patient Details  Name: [name] MRN: [id number] Date of Birth: October 11, 1966  Subjective/Objective:                    Action/Plan: Pt discharging to CIR today. CM signing off.   Expected Discharge Date:                  Expected Discharge Plan:  IP Rehab Facility  In-House Referral:     Discharge planning Services  CM Consult  Post Acute Care Choice:    Choice offered to:     DME Arranged:    DME Agency:     HH Arranged:    HH Agency:     Status of Service:  Completed, signed off  If discussed at [employer] of Stay Meetings, dates discussed:    Additional Comments:  Kermit Balo, RN 02/10/2018, 4:14 PM

## 2018-02-10 NOTE — Discharge Summary (Signed)
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 CAD (coronary artery disease) 02/18/2009   Small OM occlusion with collaterals, 60% RCA, 40% LAD. April 2015    . Cardiomyopathy, nonischemic (HCC) 05/03/2016  . CHF (congestive heart failure) (HCC)   . CKD (chronic kidney disease)   . History of syncope 08/2008   Secondary to hypertension  . Hypertension, essential, benign    a. renal art [place] (6/14): no RAS  . Hypokalemia 05/03/2016  . Noncompliance   . Nonischemic cardiomyopathy (HCC)    EF 45%  . Obesity   . Sleep apnea   . Tobacco abuse    Past Surgical History:  Procedure Laterality Date  . CARDIAC CATHETERIZATION N/A 12/22/2015   Procedure: Left Heart Cath and Coronary Angiography;  Surgeon: Lyn Records, MD;  Location: [facility] INVASIVE CV LAB;  Service: Cardiovascular;  Laterality: N/A;  . IR US GUIDE VASC ACCESS RIGHT  04/14/2017  . IR VENOGRAM ADRENAL BI  04/14/2017  . IR VENOGRAM RENAL BI  04/14/2017  . IR VENOUS SAMPLING  04/14/2017  . IR VENOUS SAMPLING  04/14/2017  . LEFT HEART CATHETERIZATION WITH CORONARY ANGIOGRAM N/A 02/21/2014   Procedure: LEFT HEART CATHETERIZATION WITH CORONARY ANGIOGRAM;  Surgeon: Kathleene Hazel, MD;  Location: [facility] CATH LAB;  Service: Cardiovascular;  Laterality: N/A;  . None      Medications to be continued on  Rehab .  stroke: mapping our early stages of recovery book   Does not apply Once  . amLODipine  10 mg Oral Daily  . enoxaparin (LOVENOX) injection  40 mg Subcutaneous Q24H  . furosemide  40 mg Oral Daily  . hydrALAZINE  100 mg Oral TID  . insulin aspart  0-9 Units Subcutaneous TID WC  . ipratropium-albuterol  3 mL Nebulization TID  . isosorbide mononitrate  60 mg Oral Daily  . lisinopril  10 mg Oral Daily  . multivitamin with minerals  1 tablet Oral Daily  . pantoprazole  40 mg Oral Daily  . senna-docusate  1 tablet Oral BID  . spironolactone  50 mg Oral Daily    LABORATORY STUDIES CBC    Component Value Date/Time   WBC 8.8 02/09/2018 0540   RBC 3.67 (L) 02/09/2018 0540   HGB 11.1 (L) 02/09/2018 0540   HCT 35.2 (L) 02/09/2018 0540   PLT 285 02/09/2018 0540   MCV 95.9 02/09/2018 0540   MCH 30.2 02/09/2018 0540   MCHC 31.5 02/09/2018 0540   RDW 14.5 02/09/2018 0540   LYMPHSABS 1.3 02/09/2018 0540   MONOABS 0.9 02/09/2018 0540   EOSABS 0.4 02/09/2018 0540   BASOSABS 0.0 02/09/2018 0540   CMP    Component Value Date/Time   NA 139 02/12/2018 0326   K 3.9 2018/02/12 0326   CL 101 February 12, 2018 0326   CO2 24 02-12-18  0326   GLUCOSE 129 (H) 2018-02-21 0326   BUN 27 (H) 02-21-18 0326   CREATININE 1.77 (H) 02/21/18 0326   CREATININE 1.76 (H) 09/21/2017 1049   CALCIUM 9.3 21-Feb-2018 0326   PROT 8.1 02/05/2018 0649   ALBUMIN 3.9 02/05/2018 0649   AST 21 02/05/2018 0649   ALT 34 02/05/2018 0649   ALKPHOS 67 02/05/2018 0649   BILITOT 0.7 02/05/2018 0649   GFRNONAA 42 (L) February 21, 2018 0326   GFRNONAA 44 (L) 09/21/2017 1049   GFRAA 49 (L) Feb 21, 2018 0326   GFRAA 51 (L) 09/21/2017 1049   COAGS Lab Results  Component Value Date   INR 0.93 02/05/2018   INR 1.00 04/14/2017   INR 1.09 12/22/2015   Lipid Panel    Component Value Date/Time   CHOL 123 02/06/2018 1037   TRIG 161 (H) 02/08/2018 0423   HDL 29 (L) 02/06/2018 1037   CHOLHDL 4.2 02/06/2018 1037   VLDL 48 (H)  02/06/2018 1037   LDLCALC 46 02/06/2018 1037   HgbA1C  Lab Results  Component Value Date   HGBA1C 6.1 (H) 02/06/2018   Urinalysis    Component Value Date/Time   COLORURINE YELLOW 02/05/2018 1322   APPEARANCEUR HAZY (A) 02/05/2018 1322   LABSPEC 1.017 02/05/2018 1322   PHURINE 5.0 02/05/2018 1322   GLUCOSEU 50 (A) 02/05/2018 1322   HGBUR LARGE (A) 02/05/2018 1322   BILIRUBINUR NEGATIVE 02/05/2018 1322   KETONESUR NEGATIVE 02/05/2018 1322   PROTEINUR >=300 (A) 02/05/2018 1322   UROBILINOGEN 0.2 09/21/2017 1030   NITRITE NEGATIVE 02/05/2018 1322   LEUKOCYTESUR NEGATIVE 02/05/2018 1322   Urine Drug Screen     Component Value Date/Time   LABOPIA NONE DETECTED 02/05/2018 1322   COCAINSCRNUR POSITIVE (A) 02/05/2018 1322   COCAINSCRNUR See Final Results 12/15/2015 1012   LABBENZ NONE DETECTED 02/05/2018 1322   AMPHETMU NONE DETECTED 02/05/2018 1322   THCU NONE DETECTED 02/05/2018 1322   LABBARB NONE DETECTED 02/05/2018 1322    Alcohol Level    Component Value Date/Time   ETH <10 02/05/2018 1006     SIGNIFICANT DIAGNOSTIC STUDIES Ct Head Wo Contrast  Result Date: 02/06/2018 CLINICAL DATA:  Follow-up examination for intracranial hemorrhage. EXAM: CT HEAD WITHOUT CONTRAST TECHNIQUE: Contiguous axial images were obtained from the base of the skull through the vertex without intravenous contrast. COMPARISON:  Prior CT from 02/05/2018. FINDINGS: Brain: Acute intraparenchymal hemorrhage involving the dorsal left para median medulla again seen, measuring 13 x 15 x 24 mm (estimated volume 2.3 mL). Mild localized vasogenic edema without significant regional mass effect. Intraventricular extension with blood in the fourth ventricle and cerebral aqueduct, extending into the posterior aspect of the third ventricle, similar to previous. Slightly increased dilatation of the lateral and third ventricles without hydrocephalus. No new intracranial hemorrhage. No acute large vessel territory  infarct. No mass lesion or midline shift. Underlying atrophy with chronic small vessel ischemic disease again noted. No extra-axial fluid collection. Vascular: No hyperdense vessel. Calcified atherosclerosis at the skull base. Skull: Scalp soft tissues and calvarium within normal limits. Sinuses/Orbits: Globes and orbital soft tissues within normal limits. Scattered mucosal thickening throughout the ethmoidal air cells and maxillary sinuses with superimposed air-fluid level within the right maxillary sinus. Small right mastoid effusion. Other: None. IMPRESSION: 1. No significant interval change in size of acute left paramedian pontomedullary hemorrhage, with similar extension into the adjacent fourth ventricle. No significant mass effect. Minimally increased ventriculomegaly as compared to previous without hydrocephalus. 2. No other new acute intracranial abnormality.  3. Interval development of acute maxillary sinusitis. Electronically Signed   By: Rise Mu M.D.   On: 02/06/2018 07:19   Mr Shirlee Latch ZO Contrast  Result Date: 02/05/2018 CLINICAL DATA:  Severe headache and double vision, began earlier today. Blood pressure greater than 200 systolic. Severe dysarthria. EXAM: MRI HEAD WITHOUT CONTRAST MRA HEAD WITHOUT CONTRAST TECHNIQUE: Multiplanar, multiecho pulse sequences of the brain and surrounding structures were obtained without intravenous contrast. Angiographic images of the head were obtained using MRA technique without contrast. COMPARISON:  CT head earlier today. FINDINGS: MRI HEAD FINDINGS Brain: There is an acute dorsal LEFT paramedian brainstem infarct, centered at the pontomedullary junction. Cross-sectional measurements of the parenchymal hemorrhage are 14 x 15 x 21 mm, corresponding to a volume of 2-3 mL, although it is difficult to quantify the additional blood which has extended into the fourth ventricle, aqueduct, and posterior third ventricle. There is mild surrounding brainstem  edema. There is obscuration of the CSF within the fourth ventricle, with mass effect on the distal aqueduct, and the patient is at risk for obstructive hydrocephalus. Age advanced atrophy. Premature for age moderate T2 and FLAIR hyperintensities throughout the brain, likely chronic microvascular ischemic change due to hypertension. Multiple foci of hemosiderin are seen in the RIGHT cerebral hemisphere, RIGHT thalamus, midbrain and pons, as well as RIGHT cerebellum, consistent with old hypertensive related bleeds. Vascular: Described below. Skull and upper cervical spine: Normal marrow signal. Sinuses/Orbits: Chronic sinus disease.  Negative orbits. Other: None. MRA HEAD FINDINGS The internal carotid arteries are markedly dolichoectatic but widely patent. 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 There is a 75% stenosis of the distal LEFT vertebral artery in its V4 segment. The RIGHT vertebral appears to contribute only to PICA. No visible cerebellar branch occlusion. There is no visible vascular malformation or saccular aneurysm. IMPRESSION: Acute LEFT paramedian pontomedullary hemorrhage, roughly similar in size to earlier CT, 14 x 15 x 21 mm corresponding to an approximate volume of 2-3 mL. Given the numerous microbleeds elsewhere in the brain, as well as markedly elevated blood pressure on presentation, this abnormality is felt to most likely represent a hypertensive related hemorrhage. Premature atrophy with chronic microvascular ischemic change. There is mass effect on the fourth ventricle and aqueduct, and hemorrhage has extended into the adjacent ventricular system. The patient is at risk for development of hydrocephalus. 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 Internal carotid arteries are widely patent. No visible saccular aneurysm or vascular malformation. Electronically Signed    By: Elsie Stain M.D.   On: 02/05/2018 10:42   Mr Brain Wo Contrast  Result Date: 02/05/2018 CLINICAL DATA:  Severe headache and double vision, began earlier today. Blood pressure greater than 200 systolic. Severe dysarthria. EXAM: MRI HEAD WITHOUT CONTRAST MRA HEAD WITHOUT CONTRAST TECHNIQUE: Multiplanar, multiecho pulse sequences of the brain and surrounding structures were obtained without intravenous contrast. Angiographic images of the head were obtained using MRA technique without contrast. COMPARISON:  CT head earlier today. FINDINGS: MRI HEAD FINDINGS Brain: There is an acute dorsal LEFT paramedian brainstem infarct, centered at the pontomedullary junction. Cross-sectional measurements of the parenchymal hemorrhage are 14 x 15 x 21 mm, corresponding to a volume of 2-3 mL, although it is difficult to quantify the additional blood which has extended into the fourth ventricle, aqueduct, and posterior third ventricle. There is mild surrounding brainstem edema. There is  obscuration of the CSF within the fourth ventricle, with mass effect on the distal aqueduct, and the patient is at risk for obstructive hydrocephalus. Age advanced atrophy. Premature for age moderate T2 and FLAIR hyperintensities throughout the brain, likely chronic microvascular ischemic change due to hypertension. Multiple foci of hemosiderin are seen in the RIGHT cerebral hemisphere, RIGHT thalamus, midbrain and pons, as well as RIGHT cerebellum, consistent with old hypertensive related bleeds. Vascular: Described below. Skull and upper cervical spine: Normal marrow signal. Sinuses/Orbits: Chronic sinus disease.  Negative orbits. Other: None. MRA HEAD FINDINGS The internal carotid arteries are markedly dolichoectatic but widely patent. 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 There is a 75% stenosis of the distal LEFT vertebral artery in its V4 segment. The RIGHT  vertebral appears to contribute only to PICA. No visible cerebellar branch occlusion. There is no visible vascular malformation or saccular aneurysm. IMPRESSION: Acute LEFT paramedian pontomedullary hemorrhage, roughly similar in size to earlier CT, 14 x 15 x 21 mm corresponding to an approximate volume of 2-3 mL. Given the numerous microbleeds elsewhere in the brain, as well as markedly elevated blood pressure on presentation, this abnormality is felt to most likely represent a hypertensive related hemorrhage. Premature atrophy with chronic microvascular ischemic change. There is mass effect on the fourth ventricle and aqueduct, and hemorrhage has extended into the adjacent ventricular system. The patient is at risk for development of hydrocephalus. 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 Internal carotid arteries are widely patent. No visible saccular aneurysm or vascular malformation. Electronically Signed   By: Elsie Stain M.D.   On: 02/05/2018 10:42   Dg Chest Port 1 View  Result Date: 02/08/2018 CLINICAL DATA:  Respiratory failure EXAM: PORTABLE CHEST 1 VIEW COMPARISON:  02/07/2018 FINDINGS: Cardiac shadow remains enlarged. Endotracheal tube and nasogastric catheter are again noted in satisfactory position. Bibasilar atelectatic changes are again noted and stable. No new focal abnormality is seen. IMPRESSION: No significant interval change from the prior exam. Electronically Signed   By: Alcide Clever M.D.   On: 02/08/2018 08:42   Dg Chest Port 1 View  Result Date: 02/07/2018 CLINICAL DATA:  Shortness of breath. EXAM: PORTABLE CHEST 1 VIEW COMPARISON:  Chest x-ray dated February 05, 2018. FINDINGS: Slight interval retraction of the endotracheal tube, now 1.9 cm above the carina. The enteric tube is unchanged. Stable cardiomegaly. Unchanged low lung volumes with bibasilar atelectasis. No pneumothorax or large pleural effusion. No acute osseous  abnormality. IMPRESSION: 1. Slight interval retraction of the endotracheal tube, now approximately 1.9 cm above the carina. 2. Persistent lung volumes with bibasilar atelectasis. Electronically Signed   By: Obie Dredge M.D.   On: 02/07/2018 09:05   Dg Chest Portable 1 View  Result Date: 02/05/2018 CLINICAL DATA:  Stroke. EXAM: PORTABLE CHEST 1 VIEW COMPARISON:  12/21/2017 FINDINGS: Endotracheal tube is 1 cm from carina. Stable enlarged cardiac silhouette. No effusion, infiltrate pneumothorax. Low lung volumes. NG tube extends the stomach. IMPRESSION: 1. Endotracheal tube is low at 1 cm from carina. Consider retraction by 2-3 cm. 2. NG tube in stomach 3. Low lung volumes Electronically Signed   By: Genevive Bi M.D.   On: 02/05/2018 08:33   Dg Knee Complete 4 Views Right  Result Date: 01/20/2018 CLINICAL DATA:  53 year old male with fall and right knee pain. EXAM: RIGHT KNEE - COMPLETE 4+ VIEW COMPARISON:  None. FINDINGS:  There is no acute fracture or dislocation. The bones are well mineralized. No significant arthritic changes. A bony spur noted in the superior pole of the patella at the insertion of the quadriceps. No significant joint effusion. The soft tissues appear unremarkable. IMPRESSION: No acute fracture or dislocation. Electronically Signed   By: Elgie Collard M.D.   On: 01/20/2018 03:34   Dg Femur, Min 2 Views Right  Result Date: 01/19/2018 CLINICAL DATA:  Larey Seat with right leg pain and swelling EXAM: RIGHT FEMUR 2 VIEWS COMPARISON:  None. FINDINGS: No definite acute displaced fracture or malalignment is seen. Vascular calcifications in the soft tissues. Lucency in the midshaft of the femur overlying the cortex felt secondary to nutrient foramen. Probable bony spurring along the inferior margin of the right acetabulum. Pubic symphysis and rami on the right side appear intact. IMPRESSION: No definite acute osseous abnormality. Electronically Signed   By: Jasmine Pang M.D.   On:  01/19/2018 23:08   Ct Head Code Stroke Wo Contrast  Result Date: 02/05/2018 CLINICAL DATA:  Code stroke.  Double vision, fixed gaze, headache EXAM: CT HEAD WITHOUT CONTRAST TECHNIQUE: Contiguous axial images were obtained from the base of the skull through the vertex without intravenous contrast. COMPARISON:  MR brain 12/19/2015 FINDINGS: Brain: There is an acute dorsal LEFT paramedian hemorrhage affecting the lower pons and upper medulla, roughly spherical, 13 mm in diameter, extending into the fourth ventricle. Mild surrounding edema. There is upward extension via the aqueduct into the posterior third ventricle. Age advanced atrophy. Extensive chronic microvascular ischemic change throughout the white matter.No hydrocephalus. No extra-axial fluid collections. Vascular: Calcification of the cavernous internal carotid arteries consistent with cerebrovascular atherosclerotic disease. No signs of intracranial large vessel occlusion. Skull: Normal. Negative for fracture or focal lesion. Sinuses/Orbits: No acute finding. Other: None. ASPECTS [facility] Stroke Program Early CT Score) Not applicable for intracranial hemorrhage. IMPRESSION: 1. Acute dorsal LEFT paramedian brainstem hemorrhage at the pontomedullary junction, extending into the fourth ventricle. Etiology is undetermined. Hypertension, vascular malformation, anticoagulation, unrecognized trauma, or drug use are all considerations. 2. ASPECTS is not applicable. 3. Attempts are being made to contact the on-call neurologist. Electronically Signed   By: Elsie Stain M.D.   On: 02/05/2018 07:13    Carotid Doppler   There is 1-39% bilateral ICA stenosis. Vertebral artery flow is antegrade.    2D Echocardiogram  - Left ventricle: The cavity size was normal. There was moderate concentric hypertrophy. Systolic function was mildly reduced. The estimated ejection fraction was in the range of 45% to 50%. Mild diffuse hypokinesis with no identifiable regional  variations. Although no diagnostic regional wall motion abnormality was identified, this possibility cannot be completely excluded on the basis of this study. Features are consistent with a pseudonormal left ventricular filling pattern, with concomitant abnormal relaxation and increased filling pressure (grade 2 diastolic dysfunction). Acoustic contrast opacification revealed no evidence ofthrombus. - Left atrium: The atrium was moderately dilated.     HISTORY OF PRESENT ILLNESS [name] is an 52 y.o. male past medical history of coronary  Artery disease, chronic heart failure, CKD, hypertension presents to [facility] emergency room as  Stroke alert.  Was last known normal last night 02/04/2018 at 11 PM when he went to bed. He woke up this morning 02/05/2018 around 6:30 AM, complained of severe headache and double vision. When EMS arrived here forced gaze devaiton to right side and severe dysarthria. BP was >200 systolic. On arrival, stat CT head showed a pontine/midbrain  hemorrhage with IVH in the 4th ventricle. Patient was severely dysarthric and intubated for airway protection. He received 40mg  of labetalol and started on Cardene drip. Neurosurgery was consulted for potential EVD placement in case he develops hydrocephalus.  NIHSS: 8 Baseline MRS 0 Intracerebral Hemorrhage (ICH) Score 2   HOSPITAL COURSE [name] is a 52 y.o. male with history of coronary artery disease, cardiomyopathy, congestive heart failure, chronic kidney disease, hypertension, medical noncompliance, obesity, obstructive sleep apnea, previous tobacco use, and previous cocaine use presenting with a severe headache, diplopia, gaze deviation, elevated blood pressure, and dysarthria.   Stroke:  Acute LEFT paramedian pontomedullary hemorrhage w/ intraventricular extension in setting of cocaine abuse and L VA stenosis, hemorrhage felt to be secondary to hypertension L VA stenosis  Resultant skew eye deviation,  left gaze palsy, diplopia, dysphagia  CT head - Acute dorsal Lt paramedian brainstem hemorrhage at the pontomedullary junction.  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  Repeat CT head 02/06/18 stable  Carotid Doppler -B ICA 1-39% stenosis, VAs antegrade   2D ECHO : EF 45 -50% diffuse hypokinesis  UDS - positive for cocaine  LDL - 46  HgbA1c - 6.1  VTE prophylaxis -lovenox (hmg is stable and it is safe to do this)  On aspirin 81 mg daily prior to admission, now on No antithrombotic  Ongoing aggressive stroke risk factor management  Therapy recommendations:  CIR  Disposition:  CIR  Hypertension  Remains elevated. Received 3 doses of labetalol during the night.  SBP goal < 180  PTA meds:  norvasc 10, lasix 40, hydralazine 100 tid, imdur 60, labetalol 200 bid  Now on:  norvasc 10, lasix 40, hydralazine 100 tid, imdur 60. RESUME home labetalol.  Long-term BP goal normotensive  Hyperlipidemia  Home meds: Lipitor 80 mg daily prior to admission now on hold secondary to hemorrhage.  LDL 46, goal < 70  Resumed statin at time of discharge  Other Stroke Risk Factors  Former cigarette smoker - quit  History of drug use - UDS positive for cocaine  ETOH < 10  Obesity, Body mass index is 34.25 kg/m., recommend weight loss, diet and exercise as appropriate   Coronary artery disease  Hx nonischemic cardiomyopathy  Obstructive sleep apnea   Other Active Problems  CKD - BUN 27 ; creatinine 1.97->1.85->1.77  Hyperglycemia -hemoglobin A1c 6.1   DISCHARGE EXAM Blood pressure (!) 177/56, pulse 82, temperature 98 F (36.7 C), temperature source Oral, resp. rate 16, height 5\' 9"  (1.753 m), weight 105.2 kg (231 lb 14.8 oz), SpO2 99 %. Pleasant middle-aged African-American gentleman in no acute distress. Afebrile. Head is nontraumatic. Neck  is supple without bruit. Cardiac exam no murmur or gallop. Lungs are clear to auscultation. Distal pulses are well felt. Neurological Exam :  A&O x 3. follows commands.  Forced R ight gaze preference.  Left gaze palsy.  Nystagmus when he looks to the right, up and down. R hypertrophia. Pupils 3 mm equal reactive. Face is symmetric. Tongue midline.  Motor system exam patient is able to move all 4 extremities against gravity but moves left side more than right.  Deep tendon reflexes are 2+ symmetric.  Plantars are downgoing.  Sensation appears preserved bilaterally.  Gait not tested.  Discharge Diet  DIET DYS 3 Nectar Thick liquids  DISCHARGE PLAN  Disposition:  Transfer to [facility]  Inpatient Rehab for ongoing PT, OT and ST  Recommend ongoing risk factor control by Primary Care Physician at time of discharge from inpatient rehabilitation.  Follow-up Massie Maroon, FNP in 2 weeks following discharge from rehab.  Follow-up with Stroke Clinic at [facility] in 4 weeks, office to schedule an appointment.   30 minutes were spent preparing discharge.  Annie Main  [facility] See Amion for Pager information March 07, 2018 1:54 PM

## 2018-02-10 NOTE — Telephone Encounter (Signed)
[name] with [facility] needs to know weight bearing status of patient. Her CB # [phone]

## 2018-02-10 NOTE — Progress Notes (Signed)
Patient arrived to unit in no acute distressed accompanied by girlfriend. States he is in no pain at this time, Delle Reining PA notified.

## 2018-02-10 NOTE — Progress Notes (Signed)
Pt discharge education and instructions completed. Pt discharge to CIR and report called off to nurse Rayfield Citizen on 4w. Pt transported off unit via bed with belongings and family to the side. Dionne Bucy RN

## 2018-02-10 NOTE — Progress Notes (Signed)
STROKE TEAM PROGRESS NOTE   SUBJECTIVE (INTERVAL HISTORY) Pt lying in bed. Still with double vision. Family present. No acute changes. Being discharged today.  CBC:  Recent Labs  Lab 02/07/18 0826 02/08/18 0423 02/09/18 0540  WBC 9.1 8.2 8.8  NEUTROABS 6.2  --  6.3  HGB 12.0* 11.9* 11.1*  HCT 37.3* 37.3* 35.2*  MCV 93.7 94.9 95.9  PLT 290 257 285    Basic Metabolic Panel:  Recent Labs  Lab 02/08/18 0423 02/08/18 1629 02/09/18 0540 02/09/2018 0326  NA 143  --  142 139  K 4.1  --  4.1 3.9  CL 108  --  106 101  CO2 25  --  25 24  GLUCOSE 115*  --  108* 129*  BUN 38*  --  37* 27*  CREATININE 1.98*  --  1.85* 1.77*  CALCIUM 9.2  --  8.9 9.3  MG 2.2 2.3  --   --   PHOS 4.3 5.5*  --   --     IMAGING None past 24h   PHYSICAL EXAM Vitals:   01/29/2018 0400 01/22/2018 0600 02/06/2018 0726 02/13/2018 0811  BP:  (!) 201/94  (!) 195/87  Pulse:  80  83  Resp:    17  Temp: 98.6 F (37 C)   97.9 F (36.6 C)  TempSrc: Oral   Oral  SpO2:   93% 97%  Weight:      Height:       Pleasant middle-aged African-American gentleman in no acute distress. Afebrile. Head is nontraumatic. Neck is supple without bruit. Cardiac exam no murmur or gallop. Lungs are clear to auscultation. Distal pulses are well felt. Neurological Exam :  A&O x 3. follows commands.  Forced R ight gaze preference.  Left gaze palsy.  Nystagmus when he looks to the right, up and down. R hypertrophia. Pupils 3 mm equal reactive. Face is symmetric. Tongue midline.  Motor system exam patient is able to move all 4 extremities against gravity but moves left side more than right.  Deep tendon reflexes are 2+ symmetric.  Plantars are downgoing.  Sensation appears preserved bilaterally.  Gait not tested.   ASSESSMENT/PLAN Mr. [name] is a 52 y.o. male with history of coronary artery disease, cardiomyopathy, congestive heart failure, chronic kidney disease, hypertension, medical noncompliance, obesity, obstructive sleep  apnea, previous tobacco use, and previous cocaine use presenting with a severe headache, diplopia, gaze deviation, elevated blood pressure, and dysarthria.   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  Repeat CT head 02/06/18 stable  Carotid Doppler -B ICA 1-39% stenosis, VAs antegrade   2D ECHO : EF 45 -50% diffuse hypokinesis  UDS - positive for cocaine  LDL - 46  HgbA1c - 6.1  VTE prophylaxis - SCDs changed to lovenox (hmg is stable and it is safe to do this) Fall precautions DIET DYS 3 Room service appropriate? Yes; Fluid consistency: Nectar Thick. ST to assess swallow  aspirin 81 mg daily prior to admission, now on No antithrombotic  Ongoing aggressive stroke risk factor management  Therapy recommendations:  CIR  Disposition:  Pending  Transfer to the floor  Hypertension  Remains elevated in the 170s  SBP goal < 180  PTA meds:  norvasc 10, lasix 40, hydralazine 100 tid, imdur 60, labetalol 200 bid  Now on:  norvasc 10, lasix 40, hydralazine 100 tid, imdur 60  Long-term BP goal normotensive  Hyperlipidemia  Home meds: Lipitor 80 mg daily prior to admission now on hold secondary to hemorrhage.  LDL 46, goal < 70  Resume statin at discharge  Other Stroke Risk Factors  Former cigarette smoker - quit  History of drug use - UDS positive for cocaine  ETOH < 10  Obesity, Body mass index is 34.25 kg/m., recommend weight loss, diet and exercise as appropriate   Coronary artery disease  Hx  nonischemic cardiomyopathy  Obstructive sleep apnea   Other Active Problems  CKD - BUN 23 ; creatinine 1.97->1.85  Hyperglycemia -hemoglobin A1c 6.1  [name] See Amion for Pager information 03/06/2018 11:15 AM   Hospital day # 5   Personally examined patient and images, and have participated in and made any corrections needed to history, physical, neuro exam,assessment and plan as stated above.  I have personally obtained the history, evaluated lab date, reviewed imaging studies and agree with radiology interpretations.    Naomie Dean, MD Stroke Neurology  To contact Stroke Continuity provider, please refer to [email or web address]. After hours, contact General Neurology

## 2018-02-10 NOTE — Progress Notes (Signed)
Patient is refusing the use of CPAP at this time. RT informed patient if he changes his mind have RN contact RT. 

## 2018-02-10 NOTE — Progress Notes (Signed)
Occupational Therapy Treatment Patient Details Name: [name] MRN: [id number] DOB: 06/03/1966 Today's Date: Feb 24, 2018    History of present illness [name] is an 52 y.o. male past medical history of coronary  Artery disease, chronic heart failure, CKD, hypertension presents to [facility] emergency room . Pt presented with HA and double vision. 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    OT comments  Pt seen with PT.  He demonstrates significant improvements today!  He was able to ambulate ~85' with min A +2 with facilitation Rt UE and Lt trunk and Lt UE.  He benefits from visual targets to allow him to fixate gaze and reduce dizziness due to nystagmus.  Occlusion glasses continue to reduce impact of diplopia.   Continue to recommend CIR.  He is very motivated.    Follow Up Recommendations  CIR;Supervision/Assistance - 24 hour    Equipment Recommendations  None recommended by OT    Recommendations for Other Services Rehab consult;PT consult;Speech consult    Precautions / Restrictions Precautions Precautions: Fall Precaution Comments: diplopia and nystagmus, decreased acuity  Restrictions Weight Bearing Restrictions: Yes RLE Weight Bearing: Weight bearing as tolerated Other Position/Activity Restrictions: Per family, NWB with KI at RLE due to recent injury before admission. Awaiting MD clearance for WBing status. Use conservative protocal.        Mobility Bed Mobility               General bed mobility comments: up in chair   Transfers Overall transfer level: Needs assistance Equipment used: Rolling walker (2 wheeled) Transfers: Sit to/from [employer] Sit to Stand: Min assist;+2 safety/equipment Stand pivot transfers: Min assist;+2 physical assistance;+2 safety/equipment       General transfer comment: Min A to steady.  Pt with ataxia Rt UE, requiring min facilitation of Rt hand on walker to maintain control.  Pt benefits from use of  visual target to fixate gaze during mobility     Balance Overall balance assessment: Needs assistance Sitting-balance support: Feet supported Sitting balance-Leahy Scale: Fair Sitting balance - Comments: Pt able to move ~23-" off BOS without LOB    Standing balance support: Bilateral upper extremity supported Standing balance-Leahy Scale: Poor Standing balance comment: reliant on UE support and min A                            ADL either performed or assessed with clinical judgement   ADL Overall ADL's : Needs assistance/impaired                     Lower Body Dressing: Moderate assistance;Sit to/from stand Lower Body Dressing Details (indicate cue type and reason): assist to pull socks over toes and to pull pants over feet  Toilet Transfer: Minimal assistance;Stand-pivot;+2 for safety/equipment;+2 for physical assistance;BSC;RW;Ambulation;Comfort height toilet   Toileting- Clothing Manipulation and Hygiene: Maximal assistance;Sit to/from stand       Functional mobility during ADLs: Minimal assistance;+2 for physical assistance;Rolling walker       Vision   Vision Assessment?: Yes Eye Alignment: Impaired (comment) Additional Comments: Pt reports occlusion glasses continue to reduce diplopia.  He demonstrates decreased acuity.   Pt continues with diplopia, dysconjugate gaze and nystagmus    Perception     Praxis      Cognition Arousal/Alertness: Awake/alert Behavior During Therapy: WFL for tasks assessed/performed;Anxious Overall Cognitive Status: Within Functional Limits for tasks assessed  General Comments: Pt mildly anxious with activity due to dizziness         Exercises     Shoulder Instructions       General Comments aunt present     Pertinent Vitals/ Pain       Pain Assessment: Faces Faces Pain Scale: No hurt  Home Living                                           Prior Functioning/Environment              Frequency  Min 3X/week        Progress Toward Goals  OT Goals(current goals can now be found in the care plan section)  Progress towards OT goals: Progressing toward goals     Plan Discharge plan remains appropriate    Co-evaluation    PT/OT/SLP Co-Evaluation/Treatment: Yes Reason for Co-Treatment: Complexity of the patient's impairments (multi-system involvement);For patient/therapist safety;To address functional/ADL transfers   OT goals addressed during session: ADL's and self-care      AM-PAC PT "6 Clicks" Daily Activity     Outcome Measure   Help from another person eating meals?: A Little Help from another person taking care of personal grooming?: A Little Help from another person toileting, which includes using toliet, bedpan, or urinal?: A Lot Help from another person bathing (including washing, rinsing, drying)?: A Lot Help from another person to put on and taking off regular upper body clothing?: A Lot Help from another person to put on and taking off regular lower body clothing?: A Lot 6 Click Score: 14    End of Session Equipment Utilized During Treatment: Gait belt;Rolling walker  OT Visit Diagnosis: Unsteadiness on feet (R26.81);Other abnormalities of gait and mobility (R26.89);Muscle weakness (generalized) (M62.81);Low vision, both eyes (H54.2)   Activity Tolerance Patient tolerated treatment well   Patient Left in chair;with call bell/phone within reach;with family/visitor present;with chair alarm set   Nurse Communication Mobility status        Time: 1610-9604 OT Time Calculation (min): 33 min  Charges: OT General Charges $OT Visit: 1 Visit OT Treatments $Neuromuscular Re-education: 8-22 mins  [employer], OTR/L [phone]    Jeani Hawking M 2018/03/09, 3:31 PM

## 2018-02-11 ENCOUNTER — Inpatient Hospital Stay (HOSPITAL_COMMUNITY): Payer: Medicaid Other | Admitting: Occupational Therapy

## 2018-02-11 ENCOUNTER — Inpatient Hospital Stay (HOSPITAL_COMMUNITY): Payer: Medicaid Other | Admitting: Certified Registered Nurse Anesthetist

## 2018-02-11 ENCOUNTER — Inpatient Hospital Stay (HOSPITAL_COMMUNITY): Payer: Medicaid Other

## 2018-02-11 ENCOUNTER — Inpatient Hospital Stay (HOSPITAL_COMMUNITY): Payer: Medicaid Other | Admitting: Physical Therapy

## 2018-02-11 MED ORDER — IPRATROPIUM-ALBUTEROL 0.5-2.5 (3) MG/3ML IN SOLN: 3.0000 mL | RESPIRATORY_TRACT | Status: DC | PRN

## 2018-02-12 ENCOUNTER — Inpatient Hospital Stay (HOSPITAL_COMMUNITY): Payer: Medicaid Other | Admitting: Physical Therapy

## 2018-02-12 ENCOUNTER — Inpatient Hospital Stay (HOSPITAL_COMMUNITY): Payer: Medicaid Other | Admitting: Occupational Therapy

## 2018-02-12 MED FILL — Medication: Qty: 1 | Status: AC

## 2018-02-13 ENCOUNTER — Inpatient Hospital Stay (HOSPITAL_COMMUNITY): Payer: Medicaid Other

## 2018-02-13 ENCOUNTER — Inpatient Hospital Stay (HOSPITAL_COMMUNITY): Payer: Medicaid Other | Admitting: Occupational Therapy

## 2018-02-13 ENCOUNTER — Inpatient Hospital Stay (HOSPITAL_COMMUNITY): Payer: Medicaid Other | Admitting: Speech Pathology

## 2018-02-13 NOTE — Telephone Encounter (Signed)
No action required. Due to status in patients chart, states patient is deceased.

## 2018-02-14 ENCOUNTER — Ambulatory Visit: Payer: Medicaid Other | Admitting: Physician Assistant

## 2018-02-20 NOTE — Death Summary Note (Signed)
DEATH SUMMARY   Patient Details  Name: [name] MRN: [id number] DOB: 26-Oct-1966  Admission/Discharge Information   Admit Date:  2018/02/27  Date of Death: Date of Death: Feb 28, 2018  Time of Death: Time of Death: 0514  Length of Stay: 1  Referring Physician: Massie Maroon, FNP   Reason(s) for Hospitalization  Left ponto-medullary hemorrhage  Diagnoses  Preliminary cause of death: Respiratory failure Secondary Diagnoses (including complications and co-morbidities):  Principal Problem:   Pontine hemorrhage (HCC) Active Problems:   OBESITY   Essential hypertension   CKD (chronic kidney disease), stage III (HCC)   Chronic combined systolic (congestive) and diastolic (congestive) heart failure [facility])   Brief Hospital Course (including significant findings, care, treatment, and services provided and events leading to death)  [name] is a 52 y.o. year old male who with history of CAD, hyperaldosteronism, CKD, HTN, NICM, recent R-Knee Fx?, medication noncompliance; who was admitted on 02/05/2018 with headache double vision and dysarthria. 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 UDS was positive for cocaine. He was started on Cardene drip due to elevated blood pressure. MRI MRA of the brain showed acute left paramedian pontomedullary hemorrhage roughly same size, mass-effect on fourth ventricle and aqueduct with concerns for development of hydrocephalus.  Repeat CT of head 3/18 showed no significant increase in size of bleed.  Dr. Wynetta Emery was consulted for input and felt no surgical intervention needed and patient tolerated extubation by 3/19. Dr. 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 He tolerated extubation on 03/21 and therapy was ongoing.   Patient with resultant right gaze preference with left neglect, double vision and poor sitting  balance. CIR recommended for follow-up therapy due to functional deficits. He was admitted to CIR on 2018/02/27 for intensive rehab program. He refused use of CPAP for treatment of OSA. On early am of 2023/03/01, he was found unresponsive by family member. He was noted to be pulseless in asystole and Code blue with ACLS protocol initiated. He was intubated and underwent CPR X 51 minutes without resuscitation. He expired at  5:14 am on 02-28-2018.     Pertinent Labs and Studies  Significant Diagnostic Studies:   Microbiology Recent Results (from the past 240 hour(s))  MRSA PCR Screening     Status: None   Collection Time: 02/05/18 10:28 AM  Result Value Ref Range Status   MRSA by PCR NEGATIVE NEGATIVE Final    Comment:        The GeneXpert MRSA Assay (FDA approved for NASAL specimens only), is one component of a comprehensive MRSA colonization surveillance program. It is not intended to diagnose MRSA infection nor to guide or monitor treatment for MRSA infections. Performed at [facility], [address], [place], [place] [address]     Lab Basic Metabolic Panel: Recent Labs  Lab 02/06/18 1037 02/07/18 0826 02/07/18 1701 02/08/18 0423 02/08/18 1629 02/09/18 0540 2018/02/27 0326  NA 139 140  --  143  --  142 139  K 3.6 3.9  --  4.1  --  4.1 3.9  CL 105 104  --  108  --  106 101  CO2 24 24  --  25  --  25 24  GLUCOSE 135* 128*  --  115*  --  108* 129*  BUN 23* 24*  --  38*  --  37* 27*  CREATININE 1.97* 1.82*  --  1.98*  --  1.85* 1.77*  CALCIUM 8.4* 9.0  --  9.2  --  8.9 9.3  MG  --  2.0 2.0 2.2 2.3  --   --   PHOS  --  3.8 3.9 4.3 5.5*  --   --    Liver Function Tests: No results for input(s): AST, ALT, ALKPHOS, BILITOT, PROT, ALBUMIN in the last 168 hours. No results for input(s): LIPASE, AMYLASE in the last 168 hours. No results for input(s): AMMONIA in the last 168 hours. CBC: Recent Labs  Lab 02/07/18 0826 02/08/18 0423 02/09/18 0540  WBC 9.1 8.2 8.8   NEUTROABS 6.2  --  6.3  HGB 12.0* 11.9* 11.1*  HCT 37.3* 37.3* 35.2*  MCV 93.7 94.9 95.9  PLT 290 257 285   Cardiac Enzymes: No results for input(s): CKTOTAL, CKMB, CKMBINDEX, TROPONINI in the last 168 hours.   Sepsis Labs: Recent Labs  Lab 02/07/18 0826 02/08/18 0423 02/09/18 0540  WBC 9.1 8.2 8.8    Procedures/Operations  N/A   Jacquelynn Cree 02/13/2018, 8:50 AM

## 2018-02-20 NOTE — Progress Notes (Signed)
Responded to Code Blue for this patient.  Girlfriend at bedside, she uses asthma.  Girlfriend called his daughters and other family members.  Her daughter and daughter's boyfriend have arrived.  Compassionate presence and active listening provided for this family.  Family providing care for one another.  Prayer and encouragement offered.  Hospitality provided.  Please page as needed.    2018-02-15 0534  Clinical Encounter Type  Visited With Patient and family together;Health care provider  Visit Type Initial;Follow-up;Spiritual support;Death;Code  Spiritual Encounters  Spiritual Needs Emotional;Grief support

## 2018-02-20 NOTE — Code Documentation (Signed)
  Patient Name: [name]   MRN: [id number]   Date of Birth/ Sex: Aug 30, 1966 , male      Admission Date: 03/08/2018  Attending Provider: Erick Colace, MD  Primary Diagnosis: L CVA   Indication: Pt was in his usual state of health until this AM, when he was noted to be unresponsive, pulseless noted to be in asystole. Code blue was subsequently called. At the time of arrival on scene, ACLS protocol was underway.   Technical Description:  - CPR performance duration:  51 minutes  - Was defibrillation or cardioversion used? No   - Was external pacer placed? No  - Was patient intubated pre/post CPR? No   Medications Administered: Y = Yes; Blank = No Amiodarone    Atropine    Calcium    Epinephrine  7  Lidocaine    Magnesium    Norepinephrine    Phenylephrine    Sodium bicarbonate    Vasopressin    Other: mannitol 1   Post CPR evaluation:  - Final Status - Was patient successfully resuscitated ? No   Miscellaneous Information:  - Time of death:  8 AM  - Primary team notified?  Yes  - Family Notified? Yes     Angelita Ingles, MD   01/20/2018, 5:31 AM

## 2018-02-20 NOTE — Anesthesia Procedure Notes (Signed)
Procedure Name: Intubation Date/Time: 03-06-2018 4:30 AM Performed by: Oletta Lamas, CRNA Pre-anesthesia Checklist: Patient identified, Emergency Drugs available, Suction available and Patient being monitored Oxygen Delivery Method: Ambu bag Preoxygenation: Pre-oxygenation with 100% oxygen Laryngoscope Size: Mac and 3 Grade View: Grade II Tube type: Subglottic suction tube Tube size: 8.0 mm Number of attempts: 1 Airway Equipment and Method: Stylet Placement Confirmation: ETT inserted through vocal cords under direct vision,  positive ETCO2 and breath sounds checked- equal and bilateral Secured at: 23 cm Tube secured with: Tape Dental Injury: Teeth and Oropharynx as per pre-operative assessment

## 2018-02-20 NOTE — Addendum Note (Signed)
Addendum  created 02/02/2018 0500 by Tillman Abide, CRNA   Charge Capture section accepted, Visit diagnoses modified

## 2018-02-20 NOTE — Progress Notes (Signed)
Trish Mage, RN  Rehab Admission Coordinator  Physical Medicine and Rehabilitation  PMR Pre-admission  Signed  Date of Service:  2018/02/25 11:38 AM       Related encounter: ED to Hosp-Admission (Discharged) from 02/05/2018 in [facility]      Signed            [] Hide copied text  [] Hover for details   PMR Admission Coordinator Pre-Admission Assessment  Patient: [name] is an 52 y.o., male MRN: [id number] DOB: Apr 08, 1966 Height: 5\' 9"  (175.3 cm) Weight: 105.2 kg (231 lb 14.8 oz)                                                                                                      Insurance Information HMO: No   PPO:       PCP:       IPA:       80/20:       OTHER:   PRIMARY: Medicaid [place] access      Policy#: [id number]      Subscriber: patient CM Name:        Phone#:       Fax#:   Pre-Cert#:        Employer: Disabled Benefits:  Phone #: [phone]     Name: Automated Eff. Date: Eligible 02/08/18 with coverage code [facility]     Deduct:        Out of Pocket Max:        Life Max:   CIR:        SNF:   Outpatient:       Co-Pay:   Home Health:        Co-Pay:   DME:       Co-Pay:   Providers:    Medicaid Application Date:        Case Manager:   Disability Application Date:        Case Worker:    Emergency Contact Information         Contact Information    Name Relation Home Work Mobile   Redd,Juanita Significant other [phone]     Petrosian, special Daughter   [phone]   delia, sitar Daughter   [phone]   Gar Ponto [phone]       Current Medical History  Patient Admitting Diagnosis: 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 UDS was positive for cocaine. He was started on Cardene drip due to elevated blood pressure. MRI MRA of the brain showed acute left paramedian pontomedullary hemorrhage roughly same size, mass-effect on fourth ventricle and aqueduct with concerns for development of hydrocephalus, 75% stenosis of distal left vertebral artery and no saccular aneurysm or vascular malformation. Repeat CT of head 3/18  showed no significant increase in size of bleed. Carotid Doppler showed no significant ICA stenosis. 2D echo showed EF of 45-50% with mild diffuse hypokinesis no thrombus moderately dilated left atrium. Dr. Wynetta Emery was consulted for input and felt no surgical intervention needed and patient tolerated extubation by 3/19. Dr. 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 He tolerated extubation on 03/21 and was started on regular textures with nectars. Therapy evaluations done yesterday revealing right gaze preference with left neglect, double vision and poor sitting balance. CIR recommended for follow-up therapy due to functional deficits.   Total: 7=NIH  Past Medical History      Past Medical History:  Diagnosis Date  . CAD (coronary artery disease) 02/18/2009   Small OM occlusion with collaterals, 60% RCA, 40% LAD. April 2015    . Cardiomyopathy, nonischemic (HCC) 05/03/2016  . CHF (congestive heart failure) (HCC)   . CKD (chronic kidney disease)   . History of syncope 08/2008   Secondary to hypertension  . Hypertension, essential, benign    a. renal art [place] (6/14): no RAS  . Hypokalemia 05/03/2016  . Noncompliance   . Nonischemic cardiomyopathy (HCC)    EF 45%  . Obesity   . Sleep apnea   . Tobacco abuse     Family History  family history includes Heart disease in his maternal grandfather;  Lupus in his mother.  Prior Rehab/Hospitalizations: No previous rehab  Has the patient had major surgery during 100 days prior to admission? No  Current Medications   Current Facility-Administered Medications:  .   stroke: mapping our early stages of recovery book, , Does not apply, Once, Biby, Sharon L, NP .  0.9 % NaCl with KCl 20 mEq/ L  infusion, , Intravenous, Continuous, Biby, Sharon L, NP, Last Rate: 50 mL/hr at 02/09/18 1938 .  acetaminophen (TYLENOL) tablet 650 mg, 650 mg, Oral, Q4H PRN, 650 mg at 01/27/2018 0045 **OR** acetaminophen (TYLENOL) solution 650 mg, 650 mg, Per Tube, Q4H PRN, 650 mg at 02/08/18 0755 **OR** acetaminophen (TYLENOL) suppository 650 mg, 650 mg, Rectal, Q4H PRN, Biby, Sharon L, NP .  amLODipine (NORVASC) tablet 10 mg, 10 mg, Oral, Daily, Biby, Sharon L, NP, 10 mg at 01/23/2018 0949 .  enoxaparin (LOVENOX) injection 40 mg, 40 mg, Subcutaneous, Q24H, Biby, Sharon L, NP, 40 mg at 01/27/2018 0950 .  furosemide (LASIX) tablet 40 mg, 40 mg, Oral, Daily, Biby, Sharon L, NP, 40 mg at 01/31/2018 0949 .  hydrALAZINE (APRESOLINE) tablet 100 mg, 100 mg, Oral, TID, Layne Benton, NP, 100 mg at 02/19/2018 0949 .  insulin aspart (novoLOG) injection 0-9 Units, 0-9 Units, Subcutaneous, TID WC, Biby, Sharon L, NP, 1 Units at 01/25/2018 1246 .  ipratropium-albuterol (DUONEB) 0.5-2.5 (3) MG/3ML nebulizer solution 3 mL, 3 mL, Nebulization, TID, Micki Riley, MD, 3 mL at 02/09/2018 0724 .  isosorbide mononitrate (IMDUR) 24 hr tablet 60 mg, 60 mg, Oral, Daily, Biby, Sharon L, NP, 60 mg at 01/22/2018 0949 .  labetalol (NORMODYNE,TRANDATE) injection 20-40 mg, 20-40 mg, Intravenous, Q2H PRN, Annie Main L, NP, 40 mg at 01/28/2018 0654 .  lisinopril (PRINIVIL,ZESTRIL) tablet 10 mg, 10 mg, Oral, Daily, Biby, Sharon L, NP, 10 mg at 01/24/2018 0949 .  multivitamin with minerals tablet 1 tablet, 1 tablet, Oral, Daily, Biby, Sharon L, NP, 1 tablet at 02/13/2018 0949 .  pantoprazole (PROTONIX) EC tablet 40  mg, 40 mg, Oral, Daily, Micki Riley, MD, 40 mg at 01/22/2018 1331 .  RESOURCE THICKENUP CLEAR, , Oral, PRN, Biby, Sharon L, NP .  senna-docusate (Senokot-S) tablet 1 tablet, 1 tablet, Oral, BID, Layne Benton, NP, 1 tablet at 01/31/2018 0949 .  spironolactone (ALDACTONE) tablet 50 mg, 50 mg, Oral, Daily, Biby, Sharon L, NP, 50 mg at 02/12/2018 1610  Patients Current Diet: Fall precautions DIET DYS 3 Room service appropriate? Yes; Fluid consistency: Nectar Thick  Precautions / Restrictions Precautions Precautions: Other (comment), Fall(vent) Restrictions Weight Bearing Restrictions: No RLE Weight Bearing: Weight bearing as tolerated Other Position/Activity Restrictions: Per family, NWB with KI at RLE due to recent injury before admission. Awaiting MD clearance for WBing status. Use conservative protocal.    Has the patient had 2 or more falls or a fall with injury in the past year?No.  Patient reports 1 fall this past year.  Prior Activity Level Limited Community (1-2x/wk): Went out 3 X a week, was not driving.  Home Assistive Devices / Equipment Home Assistive Devices/Equipment: None Home Equipment: None  Prior Device Use: Indicate devices/aids used by the patient prior to current illness, exacerbation or injury? Cane  Prior Functional Level Prior Function Level of Independence: Independent Comments: ADLs and IADLs. Not driving and working and on disability  Self Care: Did the patient need help bathing, dressing, using the toilet or eating?  Needed some help.  Needed help to put on shoes and socks  Indoor Mobility: Did the patient need assistance with walking from room to room (with or without device)? Independent  Stairs: Did the patient need assistance with internal or external stairs (with or without device)? Independent  Functional Cognition: Did the patient need help planning regular tasks such as shopping or remembering to take medications?  Independent  Current Functional Level Cognition  Overall Cognitive Status: Within Functional Limits for tasks assessed Difficult to assess due to: Intubated Orientation Level: Oriented X4 General Comments: Continue to follow cues and present with [facility] for tasks completed. Will continue to assess    Extremity Assessment (includes Sensation/Coordination)  Upper Extremity Assessment: Defer to OT evaluation  Lower Extremity Assessment: Overall WFL for tasks assessed    ADLs  Overall ADL's : Needs assistance/impaired Eating/Feeding: Minimal assistance, Sitting Eating/Feeding Details (indicate cue type and reason): Pt requiring Min A for opening apple sauce and holding cup. Pt requiring cues to problem solve bringing spoon to mouth; overshooting to left side of mouth.  Grooming: Wash/dry face, Sitting, Set up Grooming Details (indicate cue type and reason): Wiping mouth after eating Upper Body Bathing: Moderate assistance, Sitting Lower Body Bathing: Maximal assistance, Bed level Lower Body Bathing Details (indicate cue type and reason): Due to intubation, pt requiring Max A for LB bathing at bed level Upper Body Dressing : Moderate assistance, Sitting Lower Body Dressing: Maximal assistance, Bed level Lower Body Dressing Details (indicate cue type and reason): donning socks at bed level. Pt able elevate BLEs off bed to assist with donning socks Toilet Transfer: Maximal assistance, +2 for physical assistance, Stand-pivot(Simualted to recliner) Toilet Transfer Details (indicate cue type and reason): 6 Functional mobility during ADLs: Maximal assistance, +2 for physical assistance, Rolling walker(Per pt's significant other; maintaining NWB RLE ) General ADL Comments: Providing pt with occulsion glasses for diplopia. After adjusting glasses from seeing quadruple vision to seeing one. Pt then performing self feeding with apple sauce. Continues to present with poor depth perception and  propioception. Pt performing reading task with large letters, significant amount of time, and compensaotry head turn to left. Providing handout on visional occlusion.  Mobility  Overal bed mobility: Needs Assistance Bed Mobility: Supine to Sit, Sit to Supine Supine to sit: +2 for safety/equipment, Min guard Sit to supine: Mod assist, +2 for physical assistance General bed mobility comments: Min Guard for safety    Transfers  Overall transfer level: Needs assistance Equipment used: Rolling walker (2 wheeled) Transfers: Sit to/from Stand, Pharmacologist Sit to Stand: Max assist, +2 physical assistance Stand pivot transfers: Max assist, +2 physical assistance  Lateral/Scoot Transfers: Min assist, +2 safety/equipment General transfer comment: Pt requiring max verbal and tactile cues for placing LUE on RW. Requiring Max A +2 for balance during transfers. Maintaining NWB status until cleared for RLE at baseline.     Ambulation / Gait / Stairs / Wheelchair Mobility  Ambulation/Gait General Gait Details: NT    Posture / Balance Dynamic Sitting Balance Sitting balance - Comments: Pt maintaining sitting balance at EOB without LOB during grooming and excercises Balance Overall balance assessment: Needs assistance Sitting-balance support: No upper extremity supported, Feet unsupported Sitting balance-Leahy Scale: Fair Sitting balance - Comments: Pt maintaining sitting balance at EOB without LOB during grooming and excercises Standing balance support: Bilateral upper extremity supported, During functional activity Standing balance-Leahy Scale: Poor Standing balance comment: Reliant on UE support and physical A    Special needs/care consideration BiPAP/CPAP Yes, CPAP CPM No Continuous Drip IV 0.9% [place] with KCL 20 meq/L 50 mL/hr Dialysis No       Life Vest No Oxygen No  Special Bed No Trach Size No Wound Vac (area) No     Skin* No                           Bowel mgmt: No  documented BM.  Last BM 02/04/18 Bladder mgmt: Voiding in urinal Diabetic mgmt No    Previous Home Environment Living Arrangements: Spouse/significant other Available Help at Discharge: Family, Available 24 hours/day Type of Home: House Home Layout: Two level, Able to live on main level with bedroom/bathroom Home Access: Ramped entrance Bathroom Shower/Tub: Health visitor: Handicapped height Home Care Services: No Additional Comments: Planning to dc home with brother and have family switch in and out for 24 hour support  Discharge Living Setting Plans for Discharge Living Setting: Patient's home, House, Lives with (comment)(Lives with girlfriend.) Type of Home at Discharge: House Discharge Home Layout: Two level, Able to live on main level with bedroom/bathroom Alternate Level Stairs-Number of Steps: Flight Discharge Home Access: Stairs to enter, Ramped entrance Entrance Stairs-Number of Steps: 8-9 steps Does the patient have any problems obtaining your medications?: No  Social/Family/Support Systems Patient Roles: Parent, Other (Comment)(Has girlfriend, daughter, aunt.) Contact Information: Juanita Redd - significant other Anticipated Caregiver: Juanita Anticipated Industrial/product designer Information: Cleotis Lema [phone] Ability/Limitations of Caregiver: Girlfriend is not currently working and can assist. Caregiver Availability: 24/7 Discharge Plan Discussed with Primary Caregiver: Yes Is Caregiver In Agreement with Plan?: Yes Does Caregiver/Family have Issues with Lodging/Transportation while Pt is in Rehab?: No  Goals/Additional Needs Patient/Family Goal for Rehab: PT/OT/SLP supervision to min assist goals Expected length of stay: 18-22 days Cultural Considerations: [employer] Dietary Needs: Dys 3, nectar thick liquids Equipment Needs: TBD Pt/Family Agrees to Admission and willing to participate: Yes Program Orientation Provided & Reviewed with  Pt/Caregiver Including Roles  & Responsibilities: Yes  Decrease burden of Care through IP rehab admission: N/A  Possible need for SNF placement upon discharge: Not anticipated  Patient Condition: This patient's  medical and functional status has changed since the consult dated: 02/08/18 in which the Rehabilitation Physician determined and documented that the patient's condition is appropriate for intensive rehabilitative care in an inpatient rehabilitation facility. See "History of Present Illness" (above) for medical update. Functional changes are: Currently requiring min assist +2 for transfers. Patient's medical and functional status update has been discussed with the Rehabilitation physician and patient remains appropriate for inpatient rehabilitation. Will admit to inpatient rehab today.  Preadmission Screen Completed By:  Trish Mage, 02-12-2018 1:45 PM ______________________________________________________________________   Discussed status with Dr. Riley Kill on Feb 12, 2018 at 1344 and received telephone approval for admission today.  Admission Coordinator:  Trish Mage, time 1344/Date 2018-02-12             Cosigned by: Ranelle Oyster, MD at 12-Feb-2018 1:55 PM  Revision History

## 2018-02-20 NOTE — Progress Notes (Signed)
Patient's significant other called & stated that patient was not waking up. When arrived, patient was not breathing, no pulse felt or heard. CPR was initiated & the code was called. Multiple nurses were in attendance while CPR was in progress & waiting for the code team to arrive. Code team arrived & CPR was continued. On call provider was called & informed.Patient was pronounced dead at 74. Medical examiner & [facility] were called. Significant other was in attendence when code called, initiated & completed. Other family members have arrived. Patient was cleaned & devices removed for family to see the patient & bereave.

## 2018-02-20 NOTE — Significant Event (Signed)
Patient found unresponsive by family member; contacted on-call [place] to inform of active code in progress; code still in progress

## 2018-02-20 NOTE — Progress Notes (Signed)
Kirsteins, Victorino Sparrow, MD  Physician  Physical Medicine and Rehabilitation  Consult Note  Signed  Date of Service:  02/08/2018 9:40 AM       Related encounter: ED to Hosp-Admission (Discharged) from 02/05/2018 in [place] [facility]      Signed      Expand All Collapse All        Physical Medicine and Rehabilitation Consult   Reason for Consult: Functional deficits due to stroke Referring Physician: Dr. Pearlean Brownie   HPI: [name] is a 52 y.o. male with history of CAD, hyperaldosteronism, CKD, HTN, NICM, recent R-Knee Fx?,  medication noncompliance;  who was admitted on 02/05/2018 with headache double vision and dysarthria.  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  UDS was positive for cocaine. He was started on Cardene drip due to elevated blood pressure.  MRI MRA of the brain showed acute left paramedian pontomedullary hemorrhage roughly same size, mass-effect on fourth ventricle and aqueduct with concerns for development of hydrocephalus, 75% stenosis of distal left vertebral artery and no saccular aneurysm or vascular malformation.  Repeat CT of head 3/18  showed no significant increase in size of bleed.  Carotid Doppler showed no significant ICA stenosis. 2D echo showed EF of 45-50% with mild diffuse hypokinesis no thrombus moderately dilated left atrium.  Dr. Wynetta Emery was consulted for input and felt no surgical intervention needed and patient tolerated extubation by 3/19.  Dr. 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  Had difficulty with vent wean. Therapy evaluations done yesterday revealing right gaze preference with left neglect, double vision and poor sitting balance.  CIR recommended  for follow-up therapy due to functional deficits.    Patient has severe dysarthria with hypophonia.  Patient states that he is able to  swallow his secretions.  Does perform head nods inconsistently.  Review of Systems  Unable to perform ROS: Other         Past Medical History:  Diagnosis Date  . CAD (coronary artery disease) 02/18/2009   Small OM occlusion with collaterals, 60% RCA, 40% LAD. April 2015    . Cardiomyopathy, nonischemic (HCC) 05/03/2016  . CHF (congestive heart failure) (HCC)   . CKD (chronic kidney disease)   . History of syncope 08/2008   Secondary to hypertension  . Hypertension, essential, benign    a. renal art [place] (6/14): no RAS  . Hypokalemia 05/03/2016  . Noncompliance   . Nonischemic cardiomyopathy (HCC)    EF 45%  . Obesity   . Sleep apnea   . Tobacco abuse          Past Surgical History:  Procedure Laterality Date  . CARDIAC CATHETERIZATION N/A 12/22/2015   Procedure: Left Heart Cath and Coronary Angiography;  Surgeon: Lyn Records, MD;  Location: [facility] INVASIVE CV LAB;  Service: Cardiovascular;  Laterality: N/A;  . IR US GUIDE VASC ACCESS RIGHT  04/14/2017  . IR VENOGRAM ADRENAL BI  04/14/2017  . IR VENOGRAM RENAL BI  04/14/2017  . IR VENOUS SAMPLING  04/14/2017  . IR VENOUS SAMPLING  04/14/2017  . LEFT HEART CATHETERIZATION WITH CORONARY ANGIOGRAM N/A 02/21/2014   Procedure: LEFT HEART CATHETERIZATION WITH CORONARY ANGIOGRAM;  Surgeon: Kathleene Hazel, MD;  Location: [facility] CATH LAB;  Service: Cardiovascular;  Laterality: N/A;  . None           Family  History  Problem Relation Age of Onset  . Lupus Mother   . Heart disease Maternal Grandfather   . Coronary artery disease Neg Hx   . Adrenal disorder Neg Hx     Social History: Lives with girlfriend. Is a disabled cook.  Girlfriend is a Lawyer. Per reports that he has quit smoking. His smoking use included cigarettes. He has a 3.20 pack-year smoking history. he has never used smokeless tobacco. Per reports he does not drink alcohol or use drugs.         Allergies  Allergen Reactions  .  Penicillins Other (See Comments)    Makes his throat itch really bad, and feels like it is closing up.  Has patient had a PCN reaction causing immediate rash, facial/tongue/throat swelling, SOB or lightheadedness with hypotension: Yes Has patient had a PCN reaction causing severe rash involving mucus membranes or skin necrosis: No Has patient had a PCN reaction that required hospitalization: Yes Has patient had a PCN reaction occurring within the last 10 years: No If all of the above answers are "NO", then may proceed with Cephalospo  . Shrimp [Shellfish Allergy]     Throat swells          Medications Prior to Admission  Medication Sig Dispense Refill  . acetaminophen-codeine (TYLENOL #3) 300-30 MG tablet Take 1 tablet by mouth every 6 (six) hours as needed for moderate pain. 15 tablet 0  . amLODipine (NORVASC) 10 MG tablet Take 1 tablet (10 mg total) by mouth daily. 30 tablet 0  . aspirin EC 81 MG tablet Take 1 tablet (81 mg total) by mouth daily. 90 tablet 3  . atorvastatin (LIPITOR) 80 MG tablet Take 1 tablet (80 mg total) by mouth daily at 6 PM. 30 tablet 0  . furosemide (LASIX) 40 MG tablet Take 1 tablet (40 mg total) by mouth daily. 30 tablet 0  . hydrALAZINE (APRESOLINE) 100 MG tablet Take 1 tablet (100 mg total) by mouth 3 (three) times daily. 90 tablet 0  . HYDROcodone-acetaminophen (NORCO/VICODIN) 5-325 MG tablet Take 2 tablets by mouth every 6 (six) hours as needed. (Patient taking differently: Take 2 tablets by mouth every 6 (six) hours as needed for moderate pain. ) 16 tablet 0  . isosorbide mononitrate (IMDUR) 60 MG 24 hr tablet Take 1 tablet (60 mg total) by mouth daily. 30 tablet 0  . labetalol (NORMODYNE) 200 MG tablet Take 2 tablets (400 mg total) by mouth 2 (two) times daily. 90 tablet 0  . nitroGLYCERIN (NITROSTAT) 0.4 MG SL tablet Place 1 tablet (0.4 mg total) under the tongue every 5 (five) minutes as needed. For chest pain 10 tablet 0  . potassium chloride SA  (K-DUR,KLOR-CON) 20 MEQ tablet Take 2 tablets (40 mEq total) by mouth daily. 60 tablet 0  . spironolactone (ALDACTONE) 50 MG tablet Take 1 tablet (50 mg total) by mouth daily. 30 tablet 0    Home: Home Living Family/patient expects to be discharged to:: Private residence Living Arrangements: Spouse/significant other Available Help at Discharge: Family, Available 24 hours/day Type of Home: House Home Access: Ramped entrance Home Layout: Two level, Able to live on main level with bedroom/bathroom Bathroom Shower/Tub: Health visitor: Handicapped height Home Equipment: None Additional Comments: Planning to dc home with brother and have family switch in and out for 24 hour support  Functional History: Prior Function Level of Independence: Independent Comments: ADLs and IADLs. Not driving and working and on disability Functional Status:  Mobility: Bed  Mobility Overal bed mobility: Needs Assistance Bed Mobility: Supine to Sit, Sit to Supine Supine to sit: Min assist, +2 for safety/equipment, HOB elevated Sit to supine: Mod assist, +2 for physical assistance General bed mobility comments: Min A to elevate trunk into sitting. Pt able to transition hips towards EOB with Min ARequiring Mod A to bring BLEs over EOB and facilatite safe trunk descent.  Transfers Overall transfer level: Needs assistance Equipment used: None Transfers: Lateral/Scoot Transfers  Lateral/Scoot Transfers: Min assist, +2 safety/equipment General transfer comment: scooted to the right along the edge of bed with feet on floor. Min A given Ambulation/Gait General Gait Details: NT  ADL: ADL Overall ADL's : Needs assistance/impaired Eating/Feeding: NPO Grooming: Wash/dry face, Min guard, Sitting Grooming Details (indicate cue type and reason): Pt able to wash his face while sitting at EOB. demonstrating good safety awarness to avoid trach while performing task.  Upper Body Bathing: Moderate  assistance, Sitting Lower Body Bathing: Maximal assistance, Bed level Lower Body Bathing Details (indicate cue type and reason): Due to intubation, pt requiring Max A for LB bathing at bed level Upper Body Dressing : Moderate assistance, Sitting Lower Body Dressing: Maximal assistance, Bed level Lower Body Dressing Details (indicate cue type and reason): donning socks at bed level. Pt able elevate BLEs off bed to assist with donning socks General ADL Comments: Pt performing bed mobility to EOB and then completing simple grooming task and BUE excercises  Cognition: Cognition Overall Cognitive Status: Difficult to assess Orientation Level: Intubated/Tracheostomy - Unable to assess Cognition Arousal/Alertness: Awake/alert Behavior During Therapy: WFL for tasks assessed/performed Overall Cognitive Status: Difficult to assess General Comments: Pt following cues throughout session. Maintaining attention and demonstrating good safety awarness to participate in BADLs while sitting EOB on vent.  Difficult to assess due to: Intubated   Blood pressure (!) 170/75, pulse 96, temperature 99.6 F (37.6 C), temperature source Axillary, resp. rate (!) 25, height 5\' 9"  (1.753 m), weight 105.8 kg (233 lb 4 oz), SpO2 98 %. Physical Exam  Nursing note and vitals reviewed. Constitutional: He appears well-developed and well-nourished. He appears lethargic. He is easily aroused. No distress. He is sedated and intubated.  Easily aroused. Intubated with OG tube in place.   HENT:  Head: Normocephalic and atraumatic.  Eyes: Conjunctivae are normal. Pupils are equal, round, and reactive to light.  Cardiovascular: Normal rate and regular rhythm.  Respiratory: Effort normal and breath sounds normal. No stridor. He is intubated. No respiratory distress. He has no wheezes.  GI: Soft. Bowel sounds are normal. He exhibits no distension. There is no tenderness.  Musculoskeletal: He exhibits no edema.  Mild  discomfort with ROM due to recent fracture (per aunt). He indicated that he has been non-compliant with knee brace.   Neurological: He is easily aroused. He appears lethargic.  Limited due to sedation and intubated state. He was able to nod appropriately to basic biographic questions.  Able to move all four.   Skin: Skin is warm and dry. He is not diaphoretic.  Left cranial nerve VI, left cranial nerve VII palsy.  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 Does the need for close, 24 hr/day medical supervision in concert with the patient's rehab needs make it unreasonable for this patient to be served in a less intensive setting? Yes 2. Co-Morbidities requiring supervision/potential complications: Cocaine abuse, chronic kidney disease, coronary artery disease, nonischemic cardiomyopathy, hypertension 3. Due to bladder management, bowel management, safety, skin/wound care, disease management, medication administration, pain management and patient education, does the patient require 24 hr/day rehab nursing? Yes 4. Does the patient require coordinated care of a physician, rehab nurse, PT (1-2 hrs/day, 5 days/week), OT (1-2 hrs/day, 5 days/week) and SLP (.5-1 hrs/day, 5 days/week) to address physical and functional deficits in the context of the above medical diagnosis(es)? Yes Addressing deficits in the following areas: balance, endurance, locomotion, strength, transferring, bowel/bladder control, bathing, dressing, feeding, grooming, toileting, cognition, speech,  language, swallowing and psychosocial support 5. Can the patient actively participate in an intensive therapy program of at least 3 hrs of therapy per day at least 5 days per week? No 6. The potential for patient to make measurable gains while on inpatient rehab is excellent 7. Anticipated functional outcomes upon discharge from inpatient rehab are supervision and min assist  with PT, modified independent and supervision with OT, supervision and min assist with SLP. 8. Estimated rehab length of stay to reach the above functional goals is: 18-22d 9.  10. Anticipated D/C setting: Home 11. Anticipated post D/C treatments: HH therapy 12. Overall Rehab/Functional Prognosis: excellent  RECOMMENDATIONS: This patient's condition is appropriate for continued rehabilitative care in the following setting: CIR Patient has agreed to participate in recommended program. Yes Note that insurance prior authorization may be required for reimbursement for recommended care.  Comment: Currently not able to tolerate inpatient rehab but should be able to do so in the next couple days my admission coordinator to monitor progress  Erick Colace M.D. [facility] FAAPM&R (Sports Med, Neuromuscular Med) Diplomate Am Board of Electrodiagnostic Med Fellow American Board of interventional pain physicians Jacquelynn Cree, PA-C 02/08/2018        Revision History                        Routing History

## 2018-02-20 DEATH — deceased

## 2018-03-21 ENCOUNTER — Ambulatory Visit: Payer: Medicaid Other | Admitting: Family Medicine

## 2019-03-19 IMAGING — CT CT HEAD W/O CM
4 series · 15 of 47 positions shown, 17 images · non-contrast
Comparison: Prior CT from 02/05/2018.

CLINICAL DATA: Follow-up examination for intracranial hemorrhage.

EXAM:
CT HEAD WITHOUT CONTRAST
TECHNIQUE: Contiguous axial images were obtained from the base of the skull
through the vertex without intravenous contrast.

[Series 3: head without · axial · non-contrast · 0.42mm/px · z∈[-199,-74]mm · 7 of 35 slices shown, 9 images]
[im 5/35  brain]
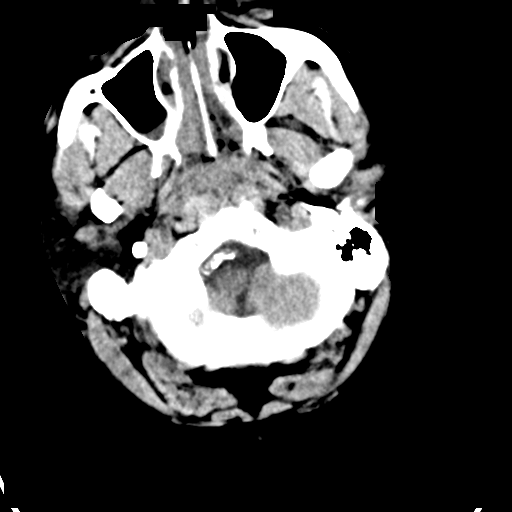
[im 5/35  bone]
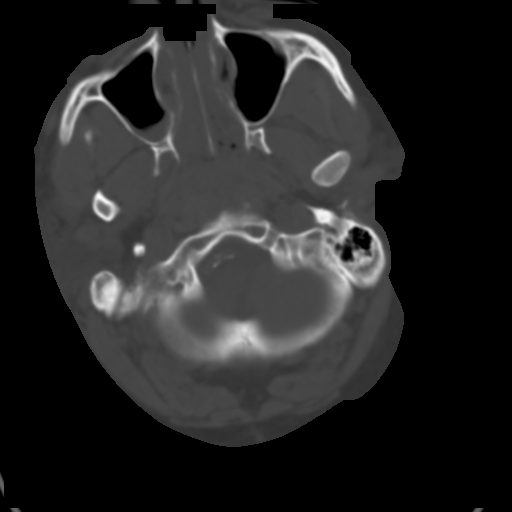
[im 9/35  brain]
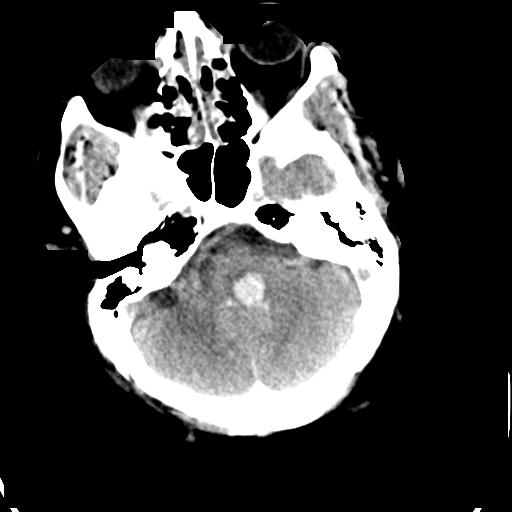
[im 13/35  brain]
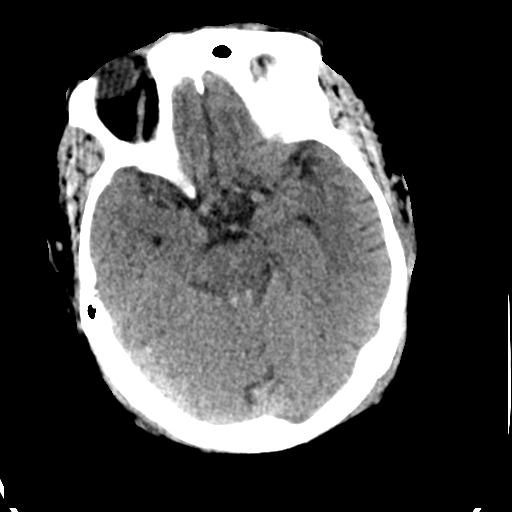
[im 18/35  brain]
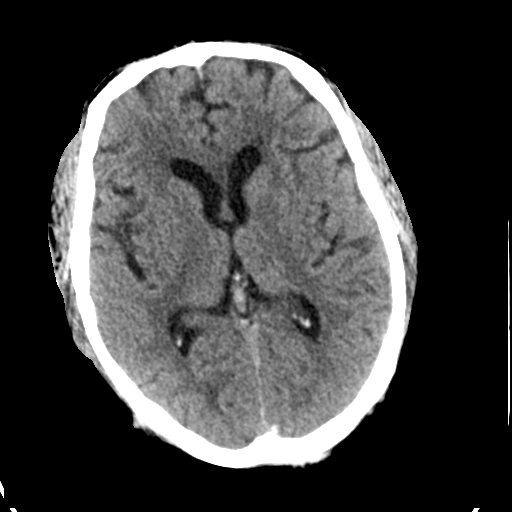
[im 22/35  brain]
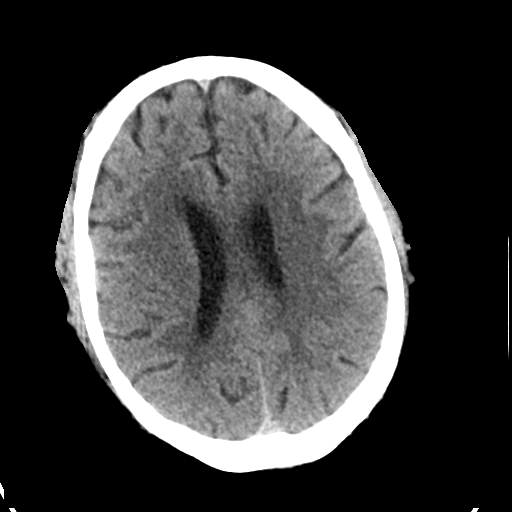
[im 22/35  bone]
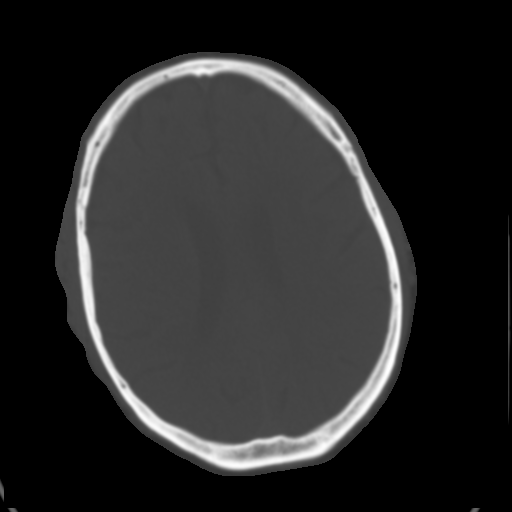
[im 26/35  brain]
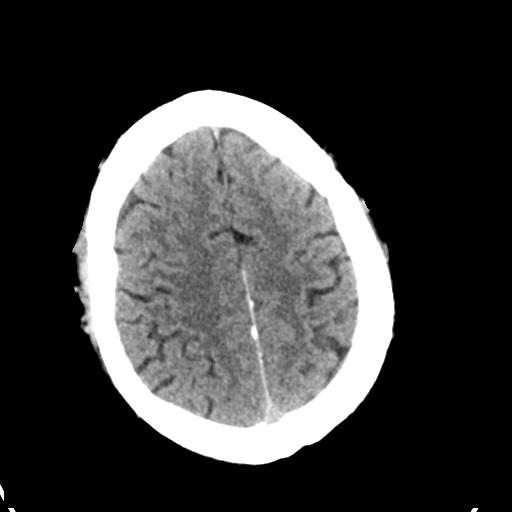
[im 30/35  brain]
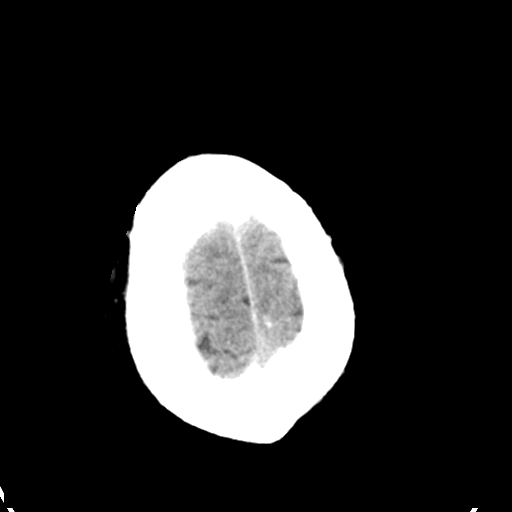

[Series 4: head bone · axial · 0.42mm/px · z∈[-203,-185]mm · 2 of 87 slices shown]
[im 9/87  bone]
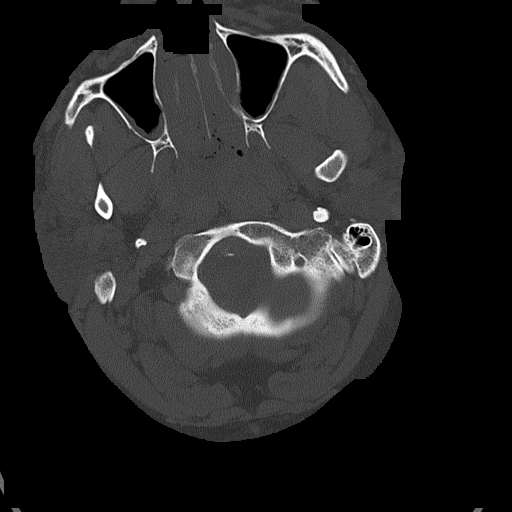
[im 18/87  bone]
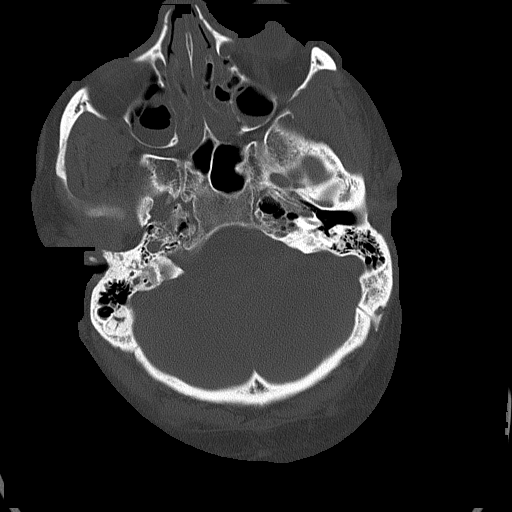

[Series 5: head without cor · coronal · non-contrast · 0.35mm/px · 3 of 70 slices shown]
[im 24/70  brain]
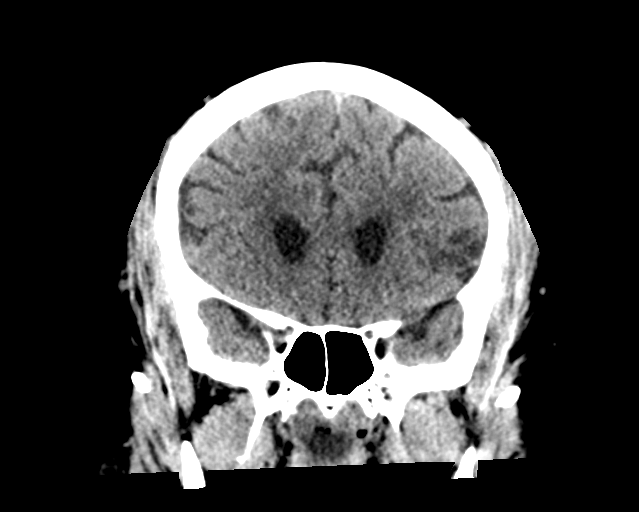
[im 31/70  brain]
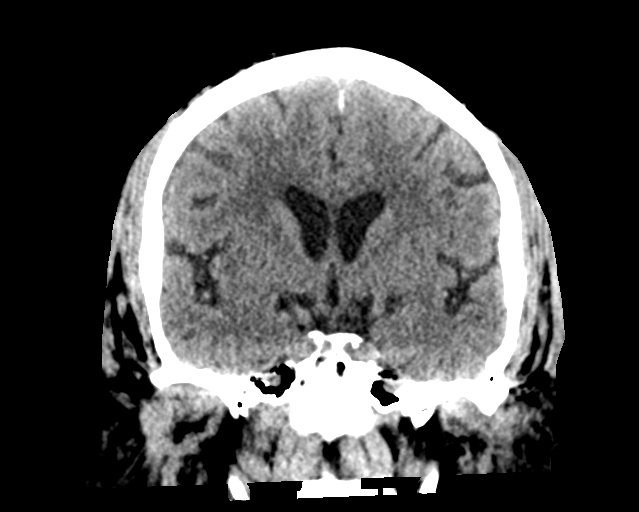
[im 39/70  brain]
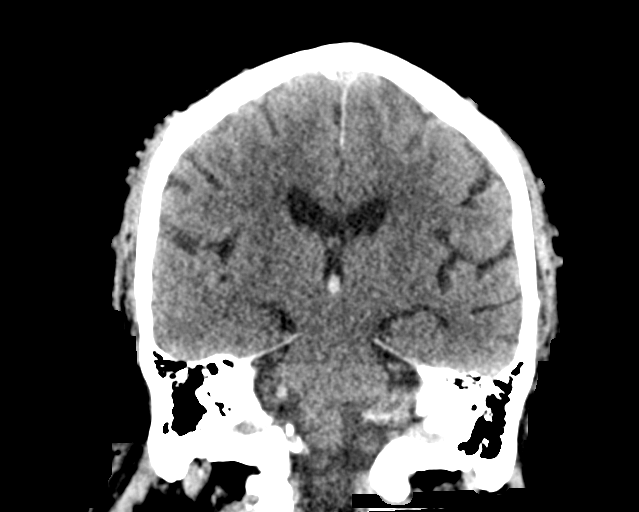

[Series 6: head without sag · sagittal · non-contrast · 0.37mm/px · 3 of 57 slices shown]
[im 19/57  brain]
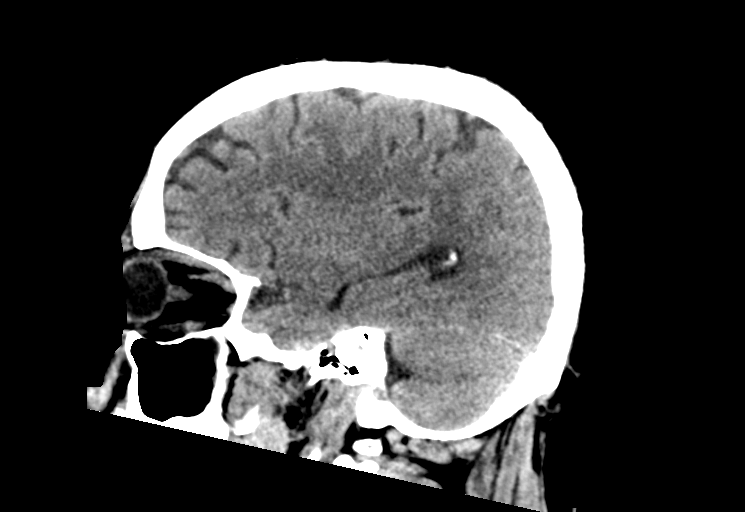
[im 29/57  brain]
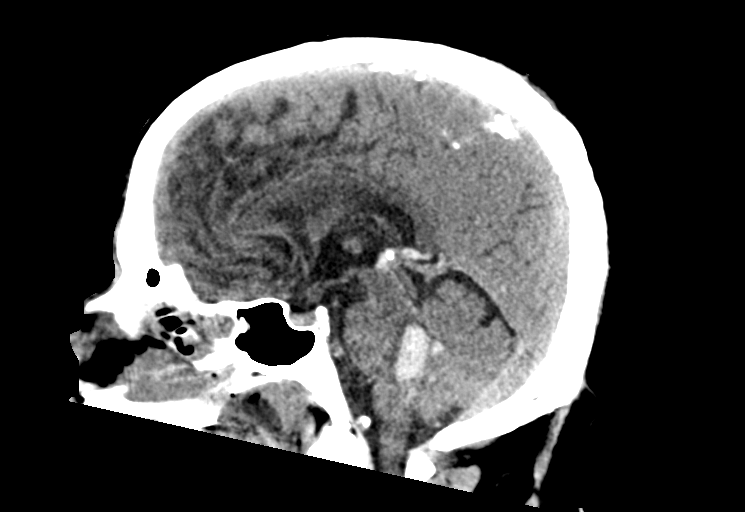
[im 38/57  brain]
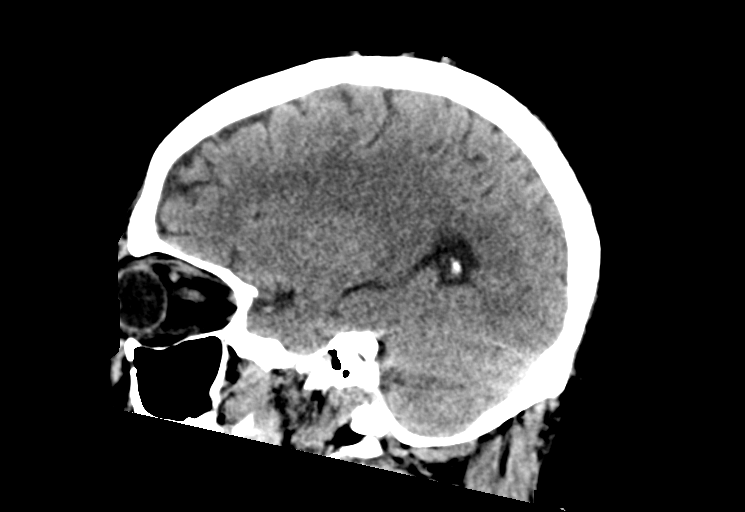

[15 of 47 positions shown; findings below may reference images not displayed]

FINDINGS: Brain: Acute intraparenchymal hemorrhage involving the dorsal left
para median medulla again seen, measuring 13 x 15 x 24 mm (estimated
volume 2.3 mL). Mild localized vasogenic edema without significant
regional mass effect. Intraventricular extension with blood in the
fourth ventricle and cerebral aqueduct, extending into the posterior
aspect of the third ventricle, similar to previous. Slightly
increased dilatation of the lateral and third ventricles without
hydrocephalus.

No new intracranial hemorrhage. No acute large vessel territory
infarct. No mass lesion or midline shift. Underlying atrophy with
chronic small vessel ischemic disease again noted. No extra-axial
fluid collection.

Vascular: No hyperdense vessel. Calcified atherosclerosis at the
skull base.

Skull: Scalp soft tissues and calvarium within normal limits.

Sinuses/Orbits: Globes and orbital soft tissues within normal
limits. Scattered mucosal thickening throughout the ethmoidal air
cells and maxillary sinuses with superimposed air-fluid level within
the right maxillary sinus. Small right mastoid effusion.

Other: None.
IMPRESSION: 1. No significant interval change in size of acute left paramedian
pontomedullary hemorrhage, with similar extension into the adjacent
fourth ventricle. No significant mass effect. Minimally increased
ventriculomegaly as compared to previous without hydrocephalus.
2. No other new acute intracranial abnormality.
3. Interval development of acute maxillary sinusitis.

## 2019-03-21 IMAGING — DX DG CHEST 1V PORT
1 series · 1 of 1 positions shown · non-contrast
Comparison: 02/07/2018

CLINICAL DATA: Respiratory failure

EXAM:
PORTABLE CHEST 1 VIEW

[chest]
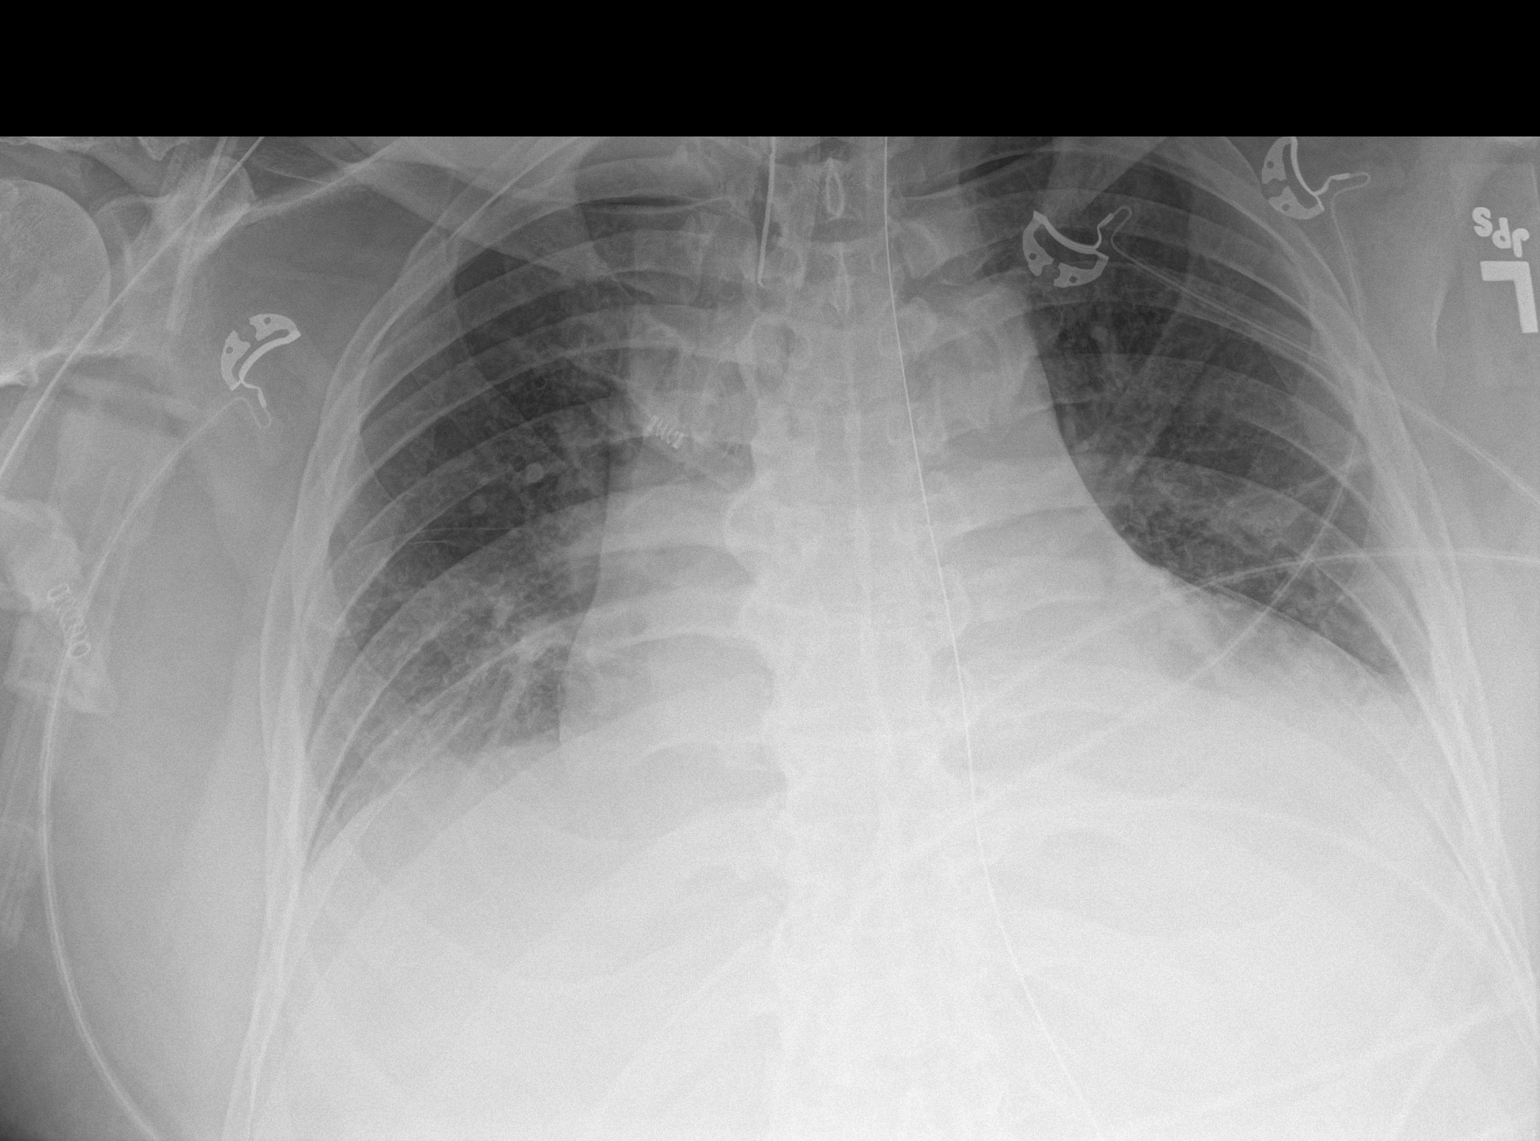

[1 of 1 positions shown; findings below may reference images not displayed]

FINDINGS: Cardiac shadow remains enlarged. Endotracheal tube and nasogastric
catheter are again noted in satisfactory position. Bibasilar
atelectatic changes are again noted and stable. No new focal
abnormality is seen.
IMPRESSION: No significant interval change from the prior exam.
